# Patient Record
Sex: Male | Born: 1970 | Race: White | Hispanic: No | Marital: Single | Smoking: Never smoker
Health system: Southern US, Community
[De-identification: ages and names within clinical notes are randomized; demographics above are authoritative.]

## PROBLEM LIST (undated history)

## (undated) DIAGNOSIS — E785 Hyperlipidemia, unspecified: Secondary | ICD-10-CM

## (undated) DIAGNOSIS — F79 Unspecified intellectual disabilities: Secondary | ICD-10-CM

## (undated) DIAGNOSIS — E039 Hypothyroidism, unspecified: Secondary | ICD-10-CM

## (undated) DIAGNOSIS — F250 Schizoaffective disorder, bipolar type: Secondary | ICD-10-CM

## (undated) DIAGNOSIS — F3010 Manic episode without psychotic symptoms, unspecified: Secondary | ICD-10-CM

## (undated) DIAGNOSIS — F0390 Unspecified dementia without behavioral disturbance: Secondary | ICD-10-CM

---

## 2004-03-02 ENCOUNTER — Emergency Department: Payer: Self-pay | Admitting: Emergency Medicine

## 2005-11-23 ENCOUNTER — Emergency Department: Payer: Self-pay | Admitting: Unknown Physician Specialty

## 2017-04-22 ENCOUNTER — Other Ambulatory Visit: Payer: Self-pay | Admitting: Student

## 2017-04-22 DIAGNOSIS — R109 Unspecified abdominal pain: Secondary | ICD-10-CM

## 2017-04-22 DIAGNOSIS — K30 Functional dyspepsia: Secondary | ICD-10-CM

## 2017-04-29 ENCOUNTER — Ambulatory Visit
Admission: RE | Admit: 2017-04-29 | Discharge: 2017-04-29 | Disposition: A | Discharge status: Home / Self Care | Payer: Medicaid Other | Source: Ambulatory Visit | Attending: Student | Admitting: Student

## 2017-04-29 DIAGNOSIS — K30 Functional dyspepsia: Secondary | ICD-10-CM | POA: Diagnosis present

## 2017-04-29 DIAGNOSIS — R109 Unspecified abdominal pain: Secondary | ICD-10-CM | POA: Insufficient documentation

## 2018-03-29 ENCOUNTER — Inpatient Hospital Stay
Admission: EM | Admit: 2018-03-29 | Discharge: 2018-04-01 | DRG: 871 | Disposition: A | Discharge status: Home Health | Payer: Medicare Other | Attending: Internal Medicine | Admitting: Internal Medicine

## 2018-03-29 ENCOUNTER — Other Ambulatory Visit: Payer: Self-pay

## 2018-03-29 ENCOUNTER — Encounter: Payer: Self-pay | Admitting: Emergency Medicine

## 2018-03-29 ENCOUNTER — Emergency Department: Payer: Medicare Other

## 2018-03-29 DIAGNOSIS — E039 Hypothyroidism, unspecified: Secondary | ICD-10-CM | POA: Diagnosis present

## 2018-03-29 DIAGNOSIS — E785 Hyperlipidemia, unspecified: Secondary | ICD-10-CM | POA: Diagnosis present

## 2018-03-29 DIAGNOSIS — Z79899 Other long term (current) drug therapy: Secondary | ICD-10-CM | POA: Diagnosis not present

## 2018-03-29 DIAGNOSIS — E876 Hypokalemia: Secondary | ICD-10-CM | POA: Diagnosis present

## 2018-03-29 DIAGNOSIS — Z79890 Hormone replacement therapy (postmenopausal): Secondary | ICD-10-CM | POA: Diagnosis not present

## 2018-03-29 DIAGNOSIS — G9341 Metabolic encephalopathy: Secondary | ICD-10-CM | POA: Diagnosis present

## 2018-03-29 DIAGNOSIS — A419 Sepsis, unspecified organism: Secondary | ICD-10-CM | POA: Diagnosis present

## 2018-03-29 DIAGNOSIS — R509 Fever, unspecified: Secondary | ICD-10-CM | POA: Diagnosis present

## 2018-03-29 DIAGNOSIS — N3090 Cystitis, unspecified without hematuria: Secondary | ICD-10-CM | POA: Diagnosis present

## 2018-03-29 DIAGNOSIS — F250 Schizoaffective disorder, bipolar type: Secondary | ICD-10-CM | POA: Diagnosis present

## 2018-03-29 DIAGNOSIS — N390 Urinary tract infection, site not specified: Secondary | ICD-10-CM | POA: Diagnosis present

## 2018-03-29 DIAGNOSIS — J189 Pneumonia, unspecified organism: Secondary | ICD-10-CM | POA: Diagnosis present

## 2018-03-29 HISTORY — DX: Manic episode without psychotic symptoms, unspecified: F30.10

## 2018-03-29 HISTORY — DX: Hypothyroidism, unspecified: E03.9

## 2018-03-29 HISTORY — DX: Schizoaffective disorder, bipolar type: F25.0

## 2018-03-29 HISTORY — DX: Unspecified intellectual disabilities: F79

## 2018-03-29 HISTORY — DX: Hyperlipidemia, unspecified: E78.5

## 2018-03-29 LAB — CBC WITH DIFFERENTIAL/PLATELET
Abs Immature Granulocytes: 0.17 10*3/uL — ABNORMAL HIGH (ref 0.00–0.07)
BASOS ABS: 0 10*3/uL (ref 0.0–0.1)
Basophils Relative: 0 %
EOS PCT: 0 %
Eosinophils Absolute: 0 10*3/uL (ref 0.0–0.5)
HCT: 43.4 % (ref 39.0–52.0)
Hemoglobin: 15.1 g/dL (ref 13.0–17.0)
Immature Granulocytes: 1 %
Lymphocytes Relative: 4 %
Lymphs Abs: 0.7 10*3/uL (ref 0.7–4.0)
MCH: 32.9 pg (ref 26.0–34.0)
MCHC: 34.8 g/dL (ref 30.0–36.0)
MCV: 94.6 fL (ref 80.0–100.0)
Monocytes Absolute: 2.3 10*3/uL — ABNORMAL HIGH (ref 0.1–1.0)
Monocytes Relative: 13 %
NRBC: 0 % (ref 0.0–0.2)
Neutro Abs: 14.2 10*3/uL — ABNORMAL HIGH (ref 1.7–7.7)
Neutrophils Relative %: 82 %
Platelets: 182 10*3/uL (ref 150–400)
RBC: 4.59 MIL/uL (ref 4.22–5.81)
RDW: 13.2 % (ref 11.5–15.5)
WBC: 17.4 10*3/uL — AB (ref 4.0–10.5)

## 2018-03-29 LAB — URINALYSIS, COMPLETE (UACMP) WITH MICROSCOPIC
Bilirubin Urine: NEGATIVE
Glucose, UA: NEGATIVE mg/dL
KETONES UR: 5 mg/dL — AB
Nitrite: NEGATIVE
PH: 5 (ref 5.0–8.0)
PROTEIN: 100 mg/dL — AB
Specific Gravity, Urine: 1.026 (ref 1.005–1.030)

## 2018-03-29 LAB — COMPREHENSIVE METABOLIC PANEL
ALK PHOS: 52 U/L (ref 38–126)
ALT: 21 U/L (ref 0–44)
AST: 33 U/L (ref 15–41)
Albumin: 3.4 g/dL — ABNORMAL LOW (ref 3.5–5.0)
Anion gap: 11 (ref 5–15)
BUN: 19 mg/dL (ref 6–20)
CO2: 18 mmol/L — ABNORMAL LOW (ref 22–32)
CREATININE: 1.29 mg/dL — AB (ref 0.61–1.24)
Calcium: 8.7 mg/dL — ABNORMAL LOW (ref 8.9–10.3)
Chloride: 106 mmol/L (ref 98–111)
GFR calc non Af Amer: >60 mL/min (ref >60)
Glucose, Bld: 127 mg/dL — ABNORMAL HIGH (ref 70–99)
Potassium: 3.3 mmol/L — ABNORMAL LOW (ref 3.5–5.1)
Sodium: 135 mmol/L (ref 135–145)
Total Bilirubin: 0.9 mg/dL (ref 0.3–1.2)
Total Protein: 6.6 g/dL (ref 6.5–8.1)

## 2018-03-29 LAB — INFLUENZA PANEL BY PCR (TYPE A & B)
INFLAPCR: NEGATIVE
Influenza B By PCR: NEGATIVE

## 2018-03-29 LAB — LACTIC ACID, PLASMA
LACTIC ACID, VENOUS: 2.4 mmol/L — AB (ref 0.5–1.9)
Lactic Acid, Venous: 2.5 mmol/L (ref 0.5–1.9)

## 2018-03-29 LAB — PROTIME-INR
INR: 1.07
Prothrombin Time: 13.8 seconds (ref 11.4–15.2)

## 2018-03-29 LAB — VALPROIC ACID LEVEL: Valproic Acid Lvl: 77 ug/mL (ref 50.0–100.0)

## 2018-03-29 MED ORDER — SODIUM CHLORIDE 0.9 % IV BOLUS (SEPSIS)
1000.0000 mL | Freq: Once | INTRAVENOUS | Status: AC
  Administered 2018-03-29: 1000 mL via INTRAVENOUS

## 2018-03-29 MED ORDER — POTASSIUM CHLORIDE CRYS ER 20 MEQ PO TBCR
40.0000 meq | EXTENDED_RELEASE_TABLET | Freq: Once | ORAL | Status: AC
  Administered 2018-03-29: 40 meq via ORAL
  Filled 2018-03-29: qty 2

## 2018-03-29 MED ORDER — TOPIRAMATE 25 MG PO TABS
75.0000 mg | ORAL_TABLET | Freq: Two times a day (BID) | ORAL | Status: DC
  Administered 2018-03-29 – 2018-04-01 (×6): 75 mg via ORAL
  Filled 2018-03-29 (×8): qty 3

## 2018-03-29 MED ORDER — DIVALPROEX SODIUM ER 500 MG PO TB24
500.0000 mg | ORAL_TABLET | Freq: Every morning | ORAL | Status: DC
  Administered 2018-03-30 – 2018-04-01 (×3): 500 mg via ORAL
  Filled 2018-03-29 (×4): qty 1

## 2018-03-29 MED ORDER — OLANZAPINE 10 MG PO TABS
30.0000 mg | ORAL_TABLET | Freq: Every day | ORAL | Status: DC
  Administered 2018-03-29 – 2018-03-31 (×3): 30 mg via ORAL
  Filled 2018-03-29 (×4): qty 3

## 2018-03-29 MED ORDER — FLUTICASONE PROPIONATE 50 MCG/ACT NA SUSP
2.0000 | Freq: Every day | NASAL | Status: DC | PRN
  Filled 2018-03-29: qty 16

## 2018-03-29 MED ORDER — ACETAMINOPHEN 325 MG PO TABS
650.0000 mg | ORAL_TABLET | Freq: Four times a day (QID) | ORAL | Status: DC | PRN
  Administered 2018-03-30: 19:00:00 650 mg via ORAL
  Filled 2018-03-29: qty 2

## 2018-03-29 MED ORDER — TRIAMCINOLONE ACETONIDE 0.1 % EX CREA
1.0000 "application " | TOPICAL_CREAM | Freq: Two times a day (BID) | CUTANEOUS | Status: DC | PRN
  Filled 2018-03-29: qty 15

## 2018-03-29 MED ORDER — PANTOPRAZOLE SODIUM 40 MG PO TBEC
40.0000 mg | DELAYED_RELEASE_TABLET | Freq: Every day | ORAL | Status: DC
  Administered 2018-03-30 – 2018-04-01 (×3): 40 mg via ORAL
  Filled 2018-03-29 (×3): qty 1

## 2018-03-29 MED ORDER — ENOXAPARIN SODIUM 40 MG/0.4ML ~~LOC~~ SOLN
40.0000 mg | SUBCUTANEOUS | Status: DC
  Administered 2018-03-30 – 2018-04-01 (×3): 40 mg via SUBCUTANEOUS
  Filled 2018-03-29 (×3): qty 0.4

## 2018-03-29 MED ORDER — SODIUM CHLORIDE 0.9 % IV SOLN
1.0000 g | INTRAVENOUS | Status: DC
  Administered 2018-03-30 – 2018-03-31 (×2): 1 g via INTRAVENOUS
  Filled 2018-03-29 (×2): qty 10
  Filled 2018-03-29: qty 1

## 2018-03-29 MED ORDER — BUPROPION HCL ER (XL) 300 MG PO TB24
300.0000 mg | ORAL_TABLET | Freq: Every day | ORAL | Status: DC
  Administered 2018-03-30 – 2018-04-01 (×3): 300 mg via ORAL
  Filled 2018-03-29 (×4): qty 1

## 2018-03-29 MED ORDER — LAMOTRIGINE 100 MG PO TABS
100.0000 mg | ORAL_TABLET | Freq: Every day | ORAL | Status: DC
  Administered 2018-03-30 – 2018-04-01 (×3): 100 mg via ORAL
  Filled 2018-03-29 (×3): qty 1

## 2018-03-29 MED ORDER — LINACLOTIDE 145 MCG PO CAPS
145.0000 ug | ORAL_CAPSULE | Freq: Every day | ORAL | Status: DC
  Administered 2018-03-30 – 2018-04-01 (×3): 145 ug via ORAL
  Filled 2018-03-29 (×4): qty 1

## 2018-03-29 MED ORDER — OLANZAPINE 10 MG PO TABS
30.0000 mg | ORAL_TABLET | ORAL | Status: AC
  Administered 2018-03-29: 30 mg via ORAL
  Filled 2018-03-29: qty 3

## 2018-03-29 MED ORDER — DOCUSATE SODIUM 100 MG PO CAPS
100.0000 mg | ORAL_CAPSULE | Freq: Two times a day (BID) | ORAL | Status: DC
  Administered 2018-03-29 – 2018-03-30 (×3): 100 mg via ORAL
  Filled 2018-03-29 (×3): qty 1

## 2018-03-29 MED ORDER — LEVOTHYROXINE SODIUM 88 MCG PO TABS
88.0000 ug | ORAL_TABLET | Freq: Every day | ORAL | Status: DC
  Administered 2018-03-30 – 2018-04-01 (×3): 88 ug via ORAL
  Filled 2018-03-29 (×3): qty 1

## 2018-03-29 MED ORDER — SUCRALFATE 1 G PO TABS
1.0000 g | ORAL_TABLET | Freq: Two times a day (BID) | ORAL | Status: DC
  Administered 2018-03-29 – 2018-04-01 (×6): 1 g via ORAL
  Filled 2018-03-29 (×5): qty 1

## 2018-03-29 MED ORDER — ONDANSETRON HCL 4 MG PO TABS: 4.0000 mg | ORAL_TABLET | Freq: Four times a day (QID) | ORAL | Status: DC | PRN

## 2018-03-29 MED ORDER — DIVALPROEX SODIUM ER 500 MG PO TB24
2000.0000 mg | ORAL_TABLET | Freq: Every day | ORAL | Status: DC
  Filled 2018-03-29: qty 4

## 2018-03-29 MED ORDER — LORATADINE 10 MG PO TABS: 10.0000 mg | ORAL_TABLET | Freq: Every day | ORAL | Status: DC | PRN

## 2018-03-29 MED ORDER — ATORVASTATIN CALCIUM 20 MG PO TABS
40.0000 mg | ORAL_TABLET | Freq: Every day | ORAL | Status: DC
  Administered 2018-03-30 – 2018-04-01 (×3): 40 mg via ORAL
  Filled 2018-03-29 (×3): qty 2

## 2018-03-29 MED ORDER — VITAMIN D 25 MCG (1000 UNIT) PO TABS
1000.0000 [IU] | ORAL_TABLET | Freq: Every day | ORAL | Status: DC
  Administered 2018-03-30 – 2018-04-01 (×3): 1000 [IU] via ORAL
  Filled 2018-03-29 (×3): qty 1

## 2018-03-29 MED ORDER — SODIUM CHLORIDE 0.9 % IV SOLN
500.0000 mg | Freq: Once | INTRAVENOUS | Status: AC
  Administered 2018-03-29: 500 mg via INTRAVENOUS
  Filled 2018-03-29: qty 500

## 2018-03-29 MED ORDER — SODIUM CHLORIDE 0.9 % IV SOLN
1000.0000 mL | INTRAVENOUS | Status: DC
  Administered 2018-03-29 – 2018-04-01 (×6): 1000 mL via INTRAVENOUS

## 2018-03-29 MED ORDER — TOPIRAMATE 25 MG PO TABS
75.0000 mg | ORAL_TABLET | ORAL | Status: AC
  Administered 2018-03-29: 75 mg via ORAL
  Filled 2018-03-29: qty 3

## 2018-03-29 MED ORDER — ACETAMINOPHEN 650 MG RE SUPP: 650.0000 mg | Freq: Four times a day (QID) | RECTAL | Status: DC | PRN

## 2018-03-29 MED ORDER — DIVALPROEX SODIUM ER 500 MG PO TB24
2000.0000 mg | ORAL_TABLET | Freq: Every day | ORAL | Status: DC
  Administered 2018-03-30 – 2018-03-31 (×2): 2000 mg via ORAL
  Filled 2018-03-29 (×3): qty 4

## 2018-03-29 MED ORDER — SODIUM FLUORIDE 1.1 % DT CREA: 1.0000 "application " | TOPICAL_CREAM | Freq: Two times a day (BID) | DENTAL | Status: DC

## 2018-03-29 MED ORDER — DIVALPROEX SODIUM ER 500 MG PO TB24
2000.0000 mg | ORAL_TABLET | ORAL | Status: AC
  Administered 2018-03-29: 2000 mg via ORAL
  Filled 2018-03-29: qty 4
  Filled 2018-03-29: qty 8

## 2018-03-29 MED ORDER — SODIUM CHLORIDE 0.9 % IV BOLUS (SEPSIS)
500.0000 mL | Freq: Once | INTRAVENOUS | Status: AC
  Administered 2018-03-29: 500 mL via INTRAVENOUS

## 2018-03-29 MED ORDER — OLANZAPINE 5 MG PO TABS: 15.0000 mg | ORAL_TABLET | ORAL | Status: DC

## 2018-03-29 MED ORDER — ONDANSETRON HCL 4 MG/2ML IJ SOLN: 4.0000 mg | Freq: Four times a day (QID) | INTRAMUSCULAR | Status: DC | PRN

## 2018-03-29 MED ORDER — SODIUM CHLORIDE 0.9 % IV SOLN
1.0000 g | Freq: Once | INTRAVENOUS | Status: AC
  Administered 2018-03-29: 1 g via INTRAVENOUS
  Filled 2018-03-29: qty 10

## 2018-03-29 NOTE — Progress Notes (Signed)
CODE SEPSIS - PHARMACY COMMUNICATION  **Broad Spectrum Antibiotics should be administered within 1 hour of Sepsis diagnosis**  Time Code Sepsis Called/Page Received: 1843  Antibiotics Ordered: Ceftriaxone  Time of 1st antibiotic administration: 1940  Additional action taken by pharmacy: 641931 - Spoke with RN who was in the process of obtaining the medication for administration  If necessary, Name of Provider/Nurse Contacted: Sarah, RN    Jared Mayer ,PharmD Clinical Pharmacist  03/29/2018  6:58 PM

## 2018-03-29 NOTE — ED Notes (Signed)
Pt much more talkative and alert in room with parents.

## 2018-03-29 NOTE — ED Provider Notes (Signed)
[facility] Emergency Department Provider Note  ____________________________________________   I have reviewed the triage vital signs and the nursing notes. Where available I have reviewed prior notes and, if possible and indicated, outside hospital notes.    HISTORY  Chief Complaint Altered Mental Status    HPI Jared Mayer is a 47 y.o. male who has a history of some degree of mental disability, as well as bipolar disorder, he has had urinary tract infections in the past, he has somewhat diminished from his baseline mentally, in terms of his level of interaction but he is alert.  Family states that he has been having decreased p.o. and having some fevers.  Otherwise, no focality.  No cough.  Positive nausea.  Level 5 chart caveat; no further history available due to patient status.  Mother believes that she has a urinary tract infection as this is usually why he gets like this.     Past Medical History:  Diagnosis Date  . Dyslipidemia   . Hypothyroid   . Manic behavior (HCC)   . Mentally disabled   . Schizoaffective disorder, bipolar type (HCC)     There are no active problems to display for this patient.   History reviewed. No pertinent surgical history.  Prior to Admission medications   Medication Sig Start Date End Date Taking? Authorizing Provider  atorvastatin (LIPITOR) 40 MG tablet Take 40 mg by mouth daily.   Yes [provider]  buPROPion (WELLBUTRIN XL) 150 MG 24 hr tablet Take 300 mg by mouth daily.   Yes [provider]  cholecalciferol (VITAMIN D3) 25 MCG (1000 UT) tablet Take 1,000 Units by mouth daily.   Yes [provider]  divalproex (DEPAKOTE ER) 500 MG 24 hr tablet Take 500-2,000 mg by mouth See admin instructions. 500 mg every morning and 2000 mg at bedtime   Yes [provider]  docusate sodium (COLACE) 100 MG capsule Take 100 mg by mouth 2 (two) times daily.   Yes [provider]   fexofenadine (ALLEGRA) 180 MG tablet Take 180 mg by mouth daily as needed for allergies.    Yes [provider]  fluticasone (FLONASE) 50 MCG/ACT nasal spray Place 2 sprays into the nose daily as needed for allergies or rhinitis.    Yes [provider]  lamoTRIgine (LAMICTAL) 100 MG tablet Take 100 mg by mouth daily.   Yes [provider]  levothyroxine (SYNTHROID, LEVOTHROID) 88 MCG tablet Take 88 mcg by mouth daily.   Yes [provider]  linaclotide (LINZESS) 145 MCG CAPS capsule Take 145 mcg by mouth daily.   Yes [provider]  OLANZapine (ZYPREXA) 15 MG tablet Take 30 mg by mouth at bedtime.   Yes [provider]  Omega-3 Fatty Acids (FISH OIL) 1000 MG CAPS Take 1,000 mg by mouth 2 (two) times daily.   Yes [provider]  omeprazole (PRILOSEC) 20 MG capsule Take 40 mg by mouth daily.   Yes [provider]  sodium fluoride (PREVIDENT 5000 PLUS) 1.1 % CREA dental cream Place 1 application onto teeth 2 (two) times daily.   Yes [provider]  sucralfate (CARAFATE) 1 g tablet Take 1 g by mouth 2 (two) times daily.   Yes [provider]  topiramate (TOPAMAX) 25 MG tablet Take 75 mg by mouth 2 (two) times daily.   Yes [provider]  triamcinolone cream (KENALOG) 0.1 % Apply 1 application topically 2 (two) times daily as needed (for inflammation/irritation).  Yes [provider]  triamcinolone ointment (KENALOG) 0.1 % Apply 1 application topically 2 (two) times daily as needed (for inflammation/irritation).   Yes [provider]    Allergies Patient has no known allergies.  Family History  Problem Relation Age of Onset  . Hypothyroidism Mother   . Sarcoidosis Mother     Social History Social History   Tobacco Use  . Smoking status: Never Smoker  . Smokeless tobacco: Never Used  Substance Use Topics  . Alcohol use: Never    Frequency: Never  . Drug use: Never     Review of Systems Constitutional: + fever/chills Eyes: No visual changes. ENT: No sore throat. No stiff neck no neck pain Cardiovascular: Denies chest pain. Respiratory: Denies shortness of breath. Gastrointestinal:   no vomiting.  No diarrhea.  No constipation. Genitourinary: Negative for dysuria. Musculoskeletal: Negative lower extremity swelling Skin: Negative for rash. Neurological: Negative for severe headaches, focal weakness or numbness.   ____________________________________________   PHYSICAL EXAM:  VITAL SIGNS: ED Triage Vitals  Enc Vitals Group     BP 03/29/18 1824 99/61     Pulse Rate 03/29/18 1824 (!) 137     Resp 03/29/18 1824 20     Temp 03/29/18 1824 (!) 100.6 F (38.1 C)     Temp Source 03/29/18 1824 Oral     SpO2 03/29/18 1824 95 %     Weight 03/29/18 1820 180 lb (81.6 kg)     Height --      Head Circumference --      Peak Flow --      Pain Score --      Pain Loc --      Pain Edu? --      Excl. in GC? --     Constitutional: Alert and very slightly somnolent but not in acute distress Eyes: Conjunctivae are normal Head: Atraumatic HEENT: No congestion/rhinnorhea. Mucous membranes are moist.  Oropharynx non-erythematous Neck:   Nontender with no meningismus, no masses, no stridor Cardiovascular: Tachycardia noted, regular rhythm. Grossly normal heart sounds.  Good peripheral circulation. Respiratory: Normal respiratory effort.  No retractions. Lungs CTAB. Abdominal: Soft and nontender. No distention. No guarding no rebound Back:  There is no focal tenderness or step off.  there is no midline tenderness there are no lesions noted. there is no CVA tenderness Normal external genitalia, no lesions noted Musculoskeletal: No lower extremity tenderness, no upper extremity tenderness. No joint effusions, no DVT signs strong distal pulses no edema Neurologic:  Normal speech and language. No gross focal neurologic deficits are appreciated.  Skin:  Skin is  warm, dry and intact. No rash noted. Psychiatric: Mood and affect are normal. Speech and behavior are normal.  ____________________________________________   LABS (all labs ordered are listed, but only abnormal results are displayed)  Labs Reviewed  COMPREHENSIVE METABOLIC PANEL - Abnormal; Notable for the following components:      Result Value   Potassium 3.3 (*)    CO2 18 (*)    Glucose, Bld 127 (*)    Creatinine, Ser 1.29 (*)    Calcium 8.7 (*)    Albumin 3.4 (*)    All other components within normal limits  CBC WITH DIFFERENTIAL/PLATELET - Abnormal; Notable for the following components:   WBC 17.4 (*)    Neutro Abs 14.2 (*)    Monocytes Absolute 2.3 (*)    Abs Immature Granulocytes 0.17 (*)    All other components within normal limits  URINALYSIS, COMPLETE (UACMP)  WITH MICROSCOPIC - Abnormal; Notable for the following components:   Color, Urine AMBER (*)    APPearance CLOUDY (*)    Hgb urine dipstick LARGE (*)    Ketones, ur 5 (*)    Protein, ur 100 (*)    Leukocytes, UA SMALL (*)    RBC / HPF >50 (*)    Bacteria, UA RARE (*)    All other components within normal limits  LACTIC ACID, PLASMA - Abnormal; Notable for the following components:   Lactic Acid, Venous 2.5 (*)    All other components within normal limits  CULTURE, BLOOD (ROUTINE X 2)  CULTURE, BLOOD (ROUTINE X 2)  URINE CULTURE  PROTIME-INR  VALPROIC ACID LEVEL  INFLUENZA PANEL BY PCR (TYPE A & B)  URINALYSIS, ROUTINE W REFLEX MICROSCOPIC  LACTIC ACID, PLASMA    Pertinent labs  results that were available during my care of the patient were reviewed by me and considered in my medical decision making (see chart for details). ____________________________________________  EKG  I personally interpreted any EKGs ordered by me or triage  ____________________________________________  RADIOLOGY  Pertinent labs & imaging results that were available during my care of the patient were reviewed by me and  considered in my medical decision making (see chart for details). If possible, patient and/or family made aware of any abnormal findings.  Dg Chest 2 View  Result Date: 03/29/2018 CLINICAL DATA:  Confusion EXAM: CHEST - 2 VIEW COMPARISON:  None. FINDINGS: Mild opacity in left base with obscuration left hemidiaphragm. The heart, hila, mediastinum, lungs, and pleura are otherwise unremarkable. IMPRESSION: Mild opacity in left base obscuring left hemidiaphragm may represent atelectasis. Early infiltrate not excluded. Recommend follow-up to resolution. Electronically Signed   By: Gerome Sam III M.D   On: 03/29/2018 19:27   ____________________________________________    PROCEDURES  Procedure(s) performed: None  Procedures  Critical Care performed: CRITICAL CARE Performed by: Jeanmarie Plant   Total critical care time: 39 minutes  Critical care time was exclusive of separately billable procedures and treating other patients.  Critical care was necessary to treat or prevent imminent or life-threatening deterioration.  Critical care was time spent personally by me on the following activities: development of treatment plan with patient and/or surrogate as well as nursing, discussions with consultants, evaluation of patient's response to treatment, examination of patient, obtaining history from patient or surrogate, ordering and performing treatments and interventions, ordering and review of laboratory studies, ordering and review of radiographic studies, pulse oximetry and re-evaluation of patient's condition.   ____________________________________________   INITIAL IMPRESSION / ASSESSMENT AND PLAN / ED COURSE  Pertinent labs & imaging results that were available during my care of the patient were reviewed by me and considered in my medical decision making (see chart for details).  Patient in no acute distress, but tachycardic febrile and concern exists for possible sepsis.  Urinary  tract infection identified possible pneumonia as well treating with Rocephin Zithromax and sepsis level fluids, patient awake and alert blood pressure and vital signs are responding well to intervention he has been admitted to the hospitalist service.  Abdomen is benign.    ____________________________________________   FINAL CLINICAL IMPRESSION(S) / ED DIAGNOSES  Final diagnoses:  Fever, unspecified fever cause      This chart was dictated using voice recognition software.  Despite best efforts to proofread,  errors can occur which can change meaning.      Jeanmarie Plant, MD 03/29/18 2042

## 2018-03-29 NOTE — ED Triage Notes (Signed)
First Nurse Note:  Arrives from group home with c/o confusion today.  Possible patient has UTI.

## 2018-03-29 NOTE — ED Triage Notes (Signed)
Been lethargic and having abdominal pain. From [facility].  Marliss CzarLinda mclamb is legal guardian and aware pt is here; she is with patient.  Febrile, tachy, and low BP in triage.

## 2018-03-29 NOTE — H&P (Signed)
[employer] - [place] [facility]    PATIENT NAME: Jared Mayer    MR#:  [id number]  DATE OF BIRTH:  [birth year]  DATE OF ADMISSION:  03/29/2018  PRIMARY CARE PHYSICIAN: [name], MD   REQUESTING/REFERRING PHYSICIAN: Dr. Ileana Roup  CHIEF COMPLAINT:   Chief Complaint  Patient presents with  . Altered Mental Status    HISTORY OF PRESENT ILLNESS:  Jared Mayer  is a 47 y.o. male with a known history of schizophrenia, MR, hypothyroidism, hyperlipidemia, previous history of UTI who presents to the hospital due to altered mental status and weakness.  Most of the history obtained from the mother and also the group home caretaker.  As per the mom and also the group home caretaker patient has not been acting like himself over the past few days.  He complained of some vague abdominal pain this past weekend and he thought he was constipated and received some laxatives, he had a bowel movement and then had some intermittent nausea vomiting which has not resolved.  Today he was feeling increasingly weak and had increasing urinary incontinence which is not normal for him.  He also had a fever of 101 at the group home and therefore was sent to the ER for further evaluation.  In the emergency room patient's had routine blood work done which showed evidence of a UTI with suspected sepsis.  Hospitalist services were contacted for admission.  PAST MEDICAL HISTORY:   Past Medical History:  Diagnosis Date  . Dyslipidemia   . Hypothyroid   . Manic behavior (HCC)   . Mentally disabled   . Schizoaffective disorder, bipolar type (HCC)     PAST SURGICAL HISTORY:  History reviewed. No pertinent surgical history.  SOCIAL HISTORY:   Social History   Tobacco Use  . Smoking status: Never Smoker  . Smokeless tobacco: Never Used  Substance Use Topics  . Alcohol use: Never    Frequency: Never    FAMILY HISTORY:   Family History  Problem Relation Age of Onset  .  Hypothyroidism Mother   . Sarcoidosis Mother     DRUG ALLERGIES:  No Known Allergies  REVIEW OF SYSTEMS:   Review of Systems  Unable to perform ROS: Mental acuity    MEDICATIONS AT HOME:   Prior to Admission medications   Medication Sig Start Date End Date Taking? Authorizing Provider  atorvastatin (LIPITOR) 40 MG tablet Take 40 mg by mouth daily.   Yes [provider]  buPROPion (WELLBUTRIN XL) 150 MG 24 hr tablet Take 300 mg by mouth daily.   Yes [provider]  cholecalciferol (VITAMIN D3) 25 MCG (1000 UT) tablet Take 1,000 Units by mouth daily.   Yes [provider]  divalproex (DEPAKOTE ER) 500 MG 24 hr tablet Take 500-2,000 mg by mouth See admin instructions. 500 mg every morning and 2000 mg at bedtime   Yes [provider]  docusate sodium (COLACE) 100 MG capsule Take 100 mg by mouth 2 (two) times daily.   Yes [provider]  fexofenadine (ALLEGRA) 180 MG tablet Take 180 mg by mouth daily as needed for allergies.    Yes [provider]  fluticasone (FLONASE) 50 MCG/ACT nasal spray Place 2 sprays into the nose daily as needed for allergies or rhinitis.    Yes [provider]  lamoTRIgine (LAMICTAL) 100 MG tablet Take 100 mg by mouth daily.   Yes [provider]  levothyroxine (SYNTHROID, LEVOTHROID) 88 MCG tablet Take 88  mcg by mouth daily.   Yes [provider]  linaclotide (LINZESS) 145 MCG CAPS capsule Take 145 mcg by mouth daily.   Yes [provider]  OLANZapine (ZYPREXA) 15 MG tablet Take 30 mg by mouth at bedtime.   Yes [provider]  Omega-3 Fatty Acids (FISH OIL) 1000 MG CAPS Take 1,000 mg by mouth 2 (two) times daily.   Yes [provider]  omeprazole (PRILOSEC) 20 MG capsule Take 40 mg by mouth daily.   Yes [provider]  sodium fluoride (PREVIDENT 5000 PLUS) 1.1 % CREA dental cream Place 1 application onto teeth 2 (two) times daily.   Yes [provider]  sucralfate (CARAFATE) 1 g tablet Take 1 g by mouth 2 (two) times daily.   Yes [provider]  topiramate (TOPAMAX) 25 MG tablet Take 75 mg by mouth 2 (two) times daily.   Yes [provider]  triamcinolone cream (KENALOG) 0.1 % Apply 1 application topically 2 (two) times daily as needed (for inflammation/irritation).   Yes [provider]  triamcinolone ointment (KENALOG) 0.1 % Apply 1 application topically 2 (two) times daily as needed (for inflammation/irritation).   Yes [provider]      VITAL SIGNS:  Blood pressure 101/76, pulse (!) 102, temperature 98.6 F (37 C), temperature source Oral, resp. rate (!) 23, weight 81.6 kg, SpO2 96 %.  PHYSICAL EXAMINATION:  Physical Exam  GENERAL:  47 y.o.-year-old patient lying in the bed in no acute distress.  EYES: Pupils equal, round, reactive to light and accommodation. No scleral icterus. Extraocular muscles intact.  HEENT: Head atraumatic, normocephalic. Oropharynx and nasopharynx clear. No oropharyngeal erythema, moist oral mucosa  NECK:  Supple, no jugular venous distention. No thyroid enlargement, no tenderness.  LUNGS: Normal breath sounds bilaterally, no wheezing, rales, rhonchi. No use of accessory muscles of respiration.  CARDIOVASCULAR: S1, S2 RRR. No murmurs, rubs, gallops, clicks.  ABDOMEN: Soft, nontender, nondistended. Bowel sounds present. No organomegaly or mass.  EXTREMITIES: No pedal edema, cyanosis, or clubbing. + 2 pedal & radial pulses b/l.   NEUROLOGIC: Cranial nerves II through XII are intact. No focal Motor or sensory deficits appreciated b/l PSYCHIATRIC: The patient is alert and oriented x 3. SKIN: No obvious rash, lesion, or ulcer.   LABORATORY PANEL:   CBC Recent Labs  Lab 03/29/18 1840  WBC 17.4*  HGB 15.1  HCT 43.4  PLT 182    ------------------------------------------------------------------------------------------------------------------  Chemistries  Recent Labs  Lab 03/29/18 1840  NA 135  K 3.3*  CL 106  CO2 18*  GLUCOSE 127*  BUN 19  CREATININE 1.29*  CALCIUM 8.7*  AST 33  ALT 21  ALKPHOS 52  BILITOT 0.9   ------------------------------------------------------------------------------------------------------------------  Cardiac Enzymes No results for input(s): TROPONINI in the last 168 hours. ------------------------------------------------------------------------------------------------------------------  RADIOLOGY:  Dg Chest 2 View  Result Date: 03/29/2018 CLINICAL DATA:  Confusion EXAM: CHEST - 2 VIEW COMPARISON:  None. FINDINGS: Mild opacity in left base with obscuration left hemidiaphragm. The heart, hila, mediastinum, lungs, and pleura are otherwise unremarkable. IMPRESSION: Mild opacity in left base obscuring left hemidiaphragm may represent atelectasis. Early infiltrate not excluded. Recommend follow-up to resolution. Electronically Signed   By: Gerome Sam III M.D   On: 03/29/2018 19:27     IMPRESSION AND PLAN:   47 year old male with past medical history of MR, schizophrenia, hyperlipidemia, hypothyroidism who presents from the group home due to altered mental status, weakness.  1.  Sepsis-patient meets criteria given his  fever, leukocytosis and abnormal urinalysis consistent with a UTI. -We will treat patient with IV ceftriaxone, follow urine and blood cultures.  2.  Urinary tract infection-source of patient's sepsis. -We will treat with IV ceftriaxone, follow urine cultures.  3.  Altered mental status- metabolic encephalopathy secondary to underlying UTI. - As mentioned we will treat the patient with IV antibiotics with ceftriaxone, follow mental status.  4.  Hypokalemia- will replace potassium orally, repeat level in the morning.  5.  History of  schizophrenia/MR-continue Depakote, Wellbutrin, Zyprexa, Topamax, Linzess.  6.  Hypothyroidism-continue Synthroid.  7.  Hyperlipidemia-continue atorvastatin.    All the records are reviewed and case discussed with ED provider. Management plans discussed with the patient, family and they are in agreement.  CODE STATUS: Full code  TOTAL TIME TAKING CARE OF THIS PATIENT: 40 minutes.    Houston SirenSAINANI,VIVEK J M.D on 03/29/2018 at 8:55 PM  Between 7am to 6pm - Pager - [phone]  After 6pm go to [email or web address] - password [email or web address] [facility]  [facility]  Office  [phone]  CC: Primary care physician; Jerl MinaHedrick, James, MD

## 2018-03-29 NOTE — Plan of Care (Signed)
  Problem: Education: Goal: Knowledge of General Education information will improve Description Including pain rating scale, medication(s)/side effects and non-pharmacologic comfort measures Outcome: Progressing   Problem: Health Behavior/Discharge Planning: Goal: Ability to manage health-related needs will improve Outcome: Progressing   Problem: Clinical Measurements: Goal: Ability to maintain clinical measurements within normal limits will improve Outcome: Progressing Goal: Will remain free from infection Outcome: Progressing Goal: Diagnostic test results will improve Outcome: Progressing Goal: Cardiovascular complication will be avoided Outcome: Progressing   Problem: Activity: Goal: Risk for activity intolerance will decrease Outcome: Progressing   Problem: Activity: Goal: Risk for activity intolerance will decrease Outcome: Progressing   Problem: Activity: Goal: Risk for activity intolerance will decrease Outcome: Progressing   Problem: Activity: Goal: Risk for activity intolerance will decrease Outcome: Progressing   Problem: Nutrition: Goal: Adequate nutrition will be maintained Outcome: Progressing   Problem: Coping: Goal: Level of anxiety will decrease Outcome: Progressing   Problem: Elimination: Goal: Will not experience complications related to bowel motility Outcome: Progressing Goal: Will not experience complications related to urinary retention Outcome: Progressing   Problem: Pain Managment: Goal: General experience of comfort will improve Outcome: Progressing   Problem: Safety: Goal: Ability to remain free from injury will improve Outcome: Progressing   Problem: Urinary Elimination: Goal: Signs and symptoms of infection will decrease Outcome: Progressing

## 2018-03-30 LAB — BASIC METABOLIC PANEL
Anion gap: 5 (ref 5–15)
BUN: 13 mg/dL (ref 6–20)
CHLORIDE: 114 mmol/L — AB (ref 98–111)
CO2: 21 mmol/L — ABNORMAL LOW (ref 22–32)
Calcium: 7.3 mg/dL — ABNORMAL LOW (ref 8.9–10.3)
Creatinine, Ser: 0.98 mg/dL (ref 0.61–1.24)
GFR calc Af Amer: >60 mL/min (ref >60)
GFR calc non Af Amer: >60 mL/min (ref >60)
Glucose, Bld: 82 mg/dL (ref 70–99)
POTASSIUM: 3.7 mmol/L (ref 3.5–5.1)
SODIUM: 140 mmol/L (ref 135–145)

## 2018-03-30 LAB — CBC
HEMATOCRIT: 39.5 % (ref 39.0–52.0)
Hemoglobin: 13 g/dL (ref 13.0–17.0)
MCH: 32.4 pg (ref 26.0–34.0)
MCHC: 32.9 g/dL (ref 30.0–36.0)
MCV: 98.5 fL (ref 80.0–100.0)
Platelets: 142 10*3/uL — ABNORMAL LOW (ref 150–400)
RBC: 4.01 MIL/uL — ABNORMAL LOW (ref 4.22–5.81)
RDW: 13.4 % (ref 11.5–15.5)
WBC: 14.1 10*3/uL — AB (ref 4.0–10.5)
nRBC: 0 % (ref 0.0–0.2)

## 2018-03-30 LAB — MRSA PCR SCREENING: MRSA BY PCR: NEGATIVE

## 2018-03-30 NOTE — Progress Notes (Signed)
[employer] -  at [facility]                                                                                                                                                                                  Patient Demographics   Jared Mayer, is a 47 y.o. male, DOB - 1970/08/05, ZOX:[id number]  Admit date - 03/29/2018   Admitting Physician Houston Siren, MD  Outpatient Primary MD for the patient is Jared Mina, MD   LOS - 1  Subjective: He is still sleepy able to answer questions    Review of Systems:   CONSTITUTIONAL: Unable to provide v Vitals:   Vitals:   03/30/18 0417 03/30/18 0815 03/30/18 0920 03/30/18 1309  BP: (!) 95/51 (!) 96/55  (!) 87/60  Pulse: 96 93  (!) 103  Resp: 20   20  Temp: 98.4 F (36.9 C) 99.5 F (37.5 C) 100.1 F (37.8 C) 98.7 F (37.1 C)  TempSrc: Oral Oral Oral Oral  SpO2: 99% 99%  98%  Weight:      Height:        Wt Readings from Last 3 Encounters:  03/29/18 81.6 kg     Intake/Output Summary (Last 24 hours) at 03/30/2018 1329 Last data filed at 03/30/2018 0257 Gross per 24 hour  Intake 3409.09 ml  Output 175 ml  Net 3234.09 ml    Physical Exam:   GENERAL: Pleasant-appearing in no apparent distress.  HEAD, EYES, EARS, NOSE AND THROAT: Atraumatic, normocephalic. Extraocular muscles are intact. Pupils equal and reactive to light. Sclerae anicteric. No conjunctival injection. No oro-pharyngeal erythema.  NECK: Supple. There is no jugular venous distention. No bruits, no lymphadenopathy, no thyromegaly.  HEART: Regular rate and rhythm,. No murmurs, no rubs, no clicks.  LUNGS: Clear to auscultation bilaterally. No rales or rhonchi. No wheezes.  ABDOMEN: Soft, flat, nontender, nondistended. Has good bowel sounds. No hepatosplenomegaly appreciated.  EXTREMITIES: No evidence of any cyanosis, clubbing, or peripheral edema.  +2 pedal and radial pulses bilaterally.  NEUROLOGIC: Sleepy.  SKIN: Moist and warm with no  rashes appreciated.  Psych: Not anxious, depressed LN: No inguinal LN enlargement    Antibiotics   Anti-infectives (From admission, onward)   Start     Dose/Rate Route Frequency Ordered Stop   03/30/18 1900  cefTRIAXone (ROCEPHIN) 1 g in sodium chloride 0.9 % 100 mL IVPB     1 g 200 mL/hr over 30 Minutes Intravenous Every 24 hours 03/29/18 2155     03/29/18 1945  azithromycin (ZITHROMAX) 500 mg in sodium chloride 0.9 % 250 mL IVPB     500 mg 250 mL/hr over 60 Minutes Intravenous  Once 03/29/18  1931 03/29/18 2114   03/29/18 1930  cefTRIAXone (ROCEPHIN) 1 g in sodium chloride 0.9 % 100 mL IVPB     1 g 200 mL/hr over 30 Minutes Intravenous  Once 03/29/18 1927 03/29/18 2251      Medications   Scheduled Meds: . atorvastatin  40 mg Oral Daily  . buPROPion  300 mg Oral Daily  . cholecalciferol  1,000 Units Oral Daily  . divalproex  2,000 mg Oral QHS  . divalproex  500 mg Oral q morning - 10a  . docusate sodium  100 mg Oral BID  . enoxaparin (LOVENOX) injection  40 mg Subcutaneous Q24H  . lamoTRIgine  100 mg Oral Daily  . levothyroxine  88 mcg Oral Q0600  . linaclotide  145 mcg Oral Daily  . OLANZapine  30 mg Oral QHS  . pantoprazole  40 mg Oral Daily  . sucralfate  1 g Oral BID  . topiramate  75 mg Oral BID   Continuous Infusions: . sodium chloride 1,000 mL (03/30/18 0635)  . cefTRIAXone (ROCEPHIN)  IV     PRN Meds:.acetaminophen **OR** acetaminophen, fluticasone, loratadine, ondansetron **OR** ondansetron (ZOFRAN) IV, triamcinolone cream   Data Review:   Micro Results Recent Results (from the past 240 hour(s))  Culture, blood (Routine x 2)     Status: None (Preliminary result)   Collection Time: 03/29/18  6:40 PM  Result Value Ref Range Status   Specimen Description BLOOD Blood Culture adequate volume  Final   Special Requests   Final    BOTTLES DRAWN AEROBIC AND ANAEROBIC RIGHT ANTECUBITAL   Culture   Final    NO GROWTH < 12 HOURS Performed at [facility], [address], [place], [place] [address]    Report Status PENDING  Incomplete  Culture, blood (Routine x 2)     Status: None (Preliminary result)   Collection Time: 03/29/18  6:40 PM  Result Value Ref Range Status   Specimen Description BLOOD Blood Culture adequate volume  Final   Special Requests   Final    BOTTLES DRAWN AEROBIC AND ANAEROBIC BLOOD LEFT FOREARM   Culture   Final    NO GROWTH < 12 HOURS Performed at [facility], [address], [place], [place] [address]    Report Status PENDING  Incomplete  MRSA PCR Screening     Status: None   Collection Time: 03/29/18 11:03 PM  Result Value Ref Range Status   MRSA by PCR NEGATIVE NEGATIVE Final    Comment:        The GeneXpert MRSA Assay (FDA approved for NASAL specimens only), is one component of a comprehensive MRSA colonization surveillance program. It is not intended to diagnose MRSA infection nor to guide or monitor treatment for MRSA infections. Performed at [facility], [address] [place], [place] [address]     Radiology Reports Dg Chest 2 View  Result Date: 03/29/2018 CLINICAL DATA:  Confusion EXAM: CHEST - 2 VIEW COMPARISON:  None. FINDINGS: Mild opacity in left base with obscuration left hemidiaphragm. The heart, hila, mediastinum, lungs, and pleura are otherwise unremarkable. IMPRESSION: Mild opacity in left base obscuring left hemidiaphragm may represent atelectasis. Early infiltrate not excluded. Recommend follow-up to resolution. Electronically Signed   By: Gerome Sam III M.D   On: 03/29/2018 19:27     CBC Recent Labs  Lab 03/29/18 1840 03/30/18 0434  WBC 17.4* 14.1*  HGB 15.1 13.0  HCT 43.4 39.5  PLT 182 142*  MCV 94.6  98.5  MCH 32.9 32.4  MCHC 34.8 32.9  RDW 13.2 13.4  LYMPHSABS 0.7  --   MONOABS 2.3*  --   EOSABS 0.0  --   BASOSABS 0.0  --     Chemistries  Recent Labs  Lab 03/29/18 1840 03/30/18 0434  NA 135 140  K 3.3* 3.7  CL 106 114*  CO2  18* 21*  GLUCOSE 127* 82  BUN 19 13  CREATININE 1.29* 0.98  CALCIUM 8.7* 7.3*  AST 33  --   ALT 21  --   ALKPHOS 52  --   BILITOT 0.9  --    ------------------------------------------------------------------------------------------------------------------ estimated creatinine clearance is 95.3 mL/min (by C-G formula based on SCr of 0.98 mg/dL). ------------------------------------------------------------------------------------------------------------------ No results for input(s): HGBA1C in the last 72 hours. ------------------------------------------------------------------------------------------------------------------ No results for input(s): CHOL, HDL, LDLCALC, TRIG, CHOLHDL, LDLDIRECT in the last 72 hours. ------------------------------------------------------------------------------------------------------------------ No results for input(s): TSH, T4TOTAL, T3FREE, THYROIDAB in the last 72 hours.  Invalid input(s): FREET3 ------------------------------------------------------------------------------------------------------------------ No results for input(s): VITAMINB12, FOLATE, FERRITIN, TIBC, IRON, RETICCTPCT in the last 72 hours.  Coagulation profile Recent Labs  Lab 03/29/18 1840  INR 1.07    No results for input(s): DDIMER in the last 72 hours.  Cardiac Enzymes No results for input(s): CKMB, TROPONINI, MYOGLOBIN in the last 168 hours.  Invalid input(s): CK ------------------------------------------------------------------------------------------------------------------ Invalid input(s): POCBNP    Assessment & Plan   47 year old male with past medical history of MR, schizophrenia, hyperlipidemia, hypothyroidism who presents from the group home due to altered mental status, weakness.  1.  Sepsis- Continue IV ceftriaxone urine cultures pending  2.  Urinary tract infection-source of patient's sepsis. -We will treat with IV ceftriaxone, follow urine  cultures.  3.  Altered mental status- metabolic encephalopathy secondary to underlying UTI. - As mentioned we will treat the patient with IV antibiotics with ceftriaxone, follow mental status.  4.  Hypokalemia- will replace potassium orally, repeat level in the morning.  5.  History of schizophrenia/MR-continue Depakote, Wellbutrin, Zyprexa, Topamax, Linzess.  6.  Hypothyroidism-continue Synthroid.  7.  Hyperlipidemia-continue atorvastatin.      Code Status Orders  (From admission, onward)         Start     Ordered   03/29/18 2156  Full code  Continuous     03/29/18 2155        Code Status History    This patient has a current code status but no historical code status.           Consults  none  DVT Prophylaxis  Lovenox    Lab Results  Component Value Date   PLT 142 (L) 03/30/2018     Time Spent in minutes 35min  Greater than 50% of time spent in care coordination and counseling patient regarding the condition and plan of care.   Auburn BilberryShreyang Cloma Rahrig M.D on 03/30/2018 at 1:29 PM  Between 7am to 6pm - Pager - [phone]  After 6pm go to [email or web address] - Social research officer, governmentpassword [email or web address]  [employer]   Office  [phone]

## 2018-03-30 NOTE — Clinical Social Work Note (Deleted)
Clinical Social Work Assessment  Patient Details  Name: Jared Mayer MRN: [id number] Date of Birth: Jun 12, 1970  Date of referral:  03/30/18               Reason for consult:  Facility Placement                Permission sought to share information with:  Facility Medical sales representativeContact Representative, Family Supports Permission granted to share information::  Yes, Verbal Permission Granted  Name::     [facility]Jared [phone]  Agency::  [facility]  Relationship::     Contact Information:     Housing/Transportation Living arrangements for the past 2 months:  Group Home Source of Information:  Patient, Medical Team Patient Interpreter Needed:  None Criminal Activity/Legal Involvement Pertinent to Current Situation/Hospitalization:  No - Comment as needed Significant Relationships:  Other Family Members, Community Support Lives with:  Facility Resident Do you feel safe going back to the place where you live?  Yes Need for family participation in patient care:  Yes (Comment)  Care giving concerns:  Patient did not express any concerns about returning back to family care home.   Social Worker assessment / plan: Patient is a 47 year old male [facility]  Patient has been living at family care home for about 10 years.  Patient states he likes where he is living and he is hopeful that he is able to return soon.   Patient expressed that he would like to go back, and he feels that the staff treat him well.  Patient did not have any comlaints about returning back to the family care home.  Employment status:  Disabled (Comment on whether or not currently receiving Disability) Insurance informatl  Patient/Family's Understanding of and Emotional Response to Diagnosis, Current Treatment, and Prognosis: Patient expressed that he is looking forward to returning back to his group home.  Emotional Assessment Appearance:   Appears stated age Attitude/Demeanor/Rapport:    Affect (typically observed):  Appropriate, Calm Orientation:  Oriented to Self, Oriented to Place, Oriented to Situation Alcohol / Substance use:  Not Applicable Psych involvement (Current and /or in the community):  No (Comment)  Discharge Needs  Concerns to be addressed:  Care Coordination Readmission within the last 30 days:  No Current discharge risk:  Cognitively Impaired Barriers to Discharge:  Continued Medical Work up   [place] Constablenterhaus, Jared Mayer, LCSWA 03/30/2018, 4:44 PM

## 2018-03-30 NOTE — Clinical Social Work Note (Signed)
Clinical Social Work Assessment  Patient Details  Name: Jared HedgerDaniel Mayer MRN: [id number] Date of Birth: 12/14/1970  Date of referral:  03/30/18               Reason for consult:  Facility Placement                Permission sought to share information with:  Facility Medical sales representativeContact Representative, Family Supports Permission granted to share information::  Yes, Verbal Permission Granted  Name::     [facility],[name] [phone]  Agency::  Family Care Home  Relationship::     Contact Information:     Housing/Transportation Living arrangements for the past 2 months:  Group Home Source of Information:  Patient, Medical Team Patient Interpreter Needed:  None Criminal Activity/Legal Involvement Pertinent to Current Situation/Hospitalization:  No - Comment as needed Significant Relationships:  Other Family Members, Community Support Lives with:  Facility Resident Do you feel safe going back to the place where you live?  Yes Need for family participation in patient care:  Yes (Comment)  Care giving concerns:  Patient did not express any concerns about returning back to family care home.  Social Worker assessment / plan:  Patient is a 47 year old male who [facility].  Patient has been living at family care home for about 10 years.  Patient states he likes where he is living and he is hopeful that he is able to return soon.   Patient expressed that he would like to go back, and he feels that the staff treat him well.  Patient did not have any comlaints about returning back to the family care home.  Employment status:  Disabled (Comment on whether or not currently receiving Disability) Insurance information:  [place]ic[place], [place] Recommend[place]:  Not assessed at this time Information / Referral to community resources:     Patient/Family's Response to care:  Patient expressed that he is looking forward to returning back  to his group home. Patient/Family's Understanding of and Emotional Response to Diagnosis, Current Treatment, and Prognosis:  Patient expressed that he is hopeful that he will not have to be in hospital much longer.  Patient is looking forward to returning back to his family care home.  Emotional Assessment Appearance:  Appears stated age Attitude/Demeanor/Rapport:    Affect (typically observed):  Appropriate, Calm Orientation:  Oriented to Self, Oriented to Place, Oriented to Situation Alcohol / Substance use:  Not Applicable Psych involvement (Current and /or in the community):  No (Comment)  Discharge Needs  Concerns to be addressed:  Care Coordination Readmission within the last 30 days:  No Current discharge risk:  Cognitively Impaired Barriers to Discharge:  Continued Medical Work up   [place] Constablenterhaus, Tori Dattilio R, LCSWA 03/30/2018, 5:19 PM

## 2018-03-31 LAB — BASIC METABOLIC PANEL
Anion gap: 6 (ref 5–15)
BUN: 15 mg/dL (ref 6–20)
CHLORIDE: 113 mmol/L — AB (ref 98–111)
CO2: 22 mmol/L (ref 22–32)
Calcium: 7.7 mg/dL — ABNORMAL LOW (ref 8.9–10.3)
Creatinine, Ser: 0.7 mg/dL (ref 0.61–1.24)
GFR calc Af Amer: >60 mL/min (ref >60)
GFR calc non Af Amer: >60 mL/min (ref >60)
GLUCOSE: 79 mg/dL (ref 70–99)
POTASSIUM: 3.4 mmol/L — AB (ref 3.5–5.1)
SODIUM: 141 mmol/L (ref 135–145)

## 2018-03-31 LAB — URINE CULTURE: Culture: NO GROWTH

## 2018-03-31 LAB — CBC
HCT: 37 % — ABNORMAL LOW (ref 39.0–52.0)
HEMOGLOBIN: 12.4 g/dL — AB (ref 13.0–17.0)
MCH: 33.1 pg (ref 26.0–34.0)
MCHC: 33.5 g/dL (ref 30.0–36.0)
MCV: 98.7 fL (ref 80.0–100.0)
Platelets: 111 10*3/uL — ABNORMAL LOW (ref 150–400)
RBC: 3.75 MIL/uL — ABNORMAL LOW (ref 4.22–5.81)
RDW: 13.5 % (ref 11.5–15.5)
WBC: 9.8 10*3/uL (ref 4.0–10.5)
nRBC: 0 % (ref 0.0–0.2)

## 2018-03-31 LAB — HIV ANTIBODY (ROUTINE TESTING W REFLEX): HIV Screen 4th Generation wRfx: NONREACTIVE

## 2018-03-31 LAB — PROCALCITONIN: PROCALCITONIN: 1.83 ng/mL

## 2018-03-31 MED ORDER — POTASSIUM CHLORIDE CRYS ER 20 MEQ PO TBCR
20.0000 meq | EXTENDED_RELEASE_TABLET | Freq: Two times a day (BID) | ORAL | Status: AC
  Administered 2018-03-31 (×2): 20 meq via ORAL
  Filled 2018-03-31 (×2): qty 1

## 2018-03-31 MED ORDER — OMEGA-3-ACID ETHYL ESTERS 1 G PO CAPS
1.0000 g | ORAL_CAPSULE | Freq: Two times a day (BID) | ORAL | Status: DC
  Administered 2018-03-31 – 2018-04-01 (×3): 1 g via ORAL
  Filled 2018-03-31 (×3): qty 1

## 2018-03-31 MED ORDER — SODIUM CHLORIDE 0.9 % IV SOLN
500.0000 mg | INTRAVENOUS | Status: DC
  Administered 2018-03-31: 14:00:00 500 mg via INTRAVENOUS
  Filled 2018-03-31 (×2): qty 500

## 2018-03-31 NOTE — Progress Notes (Signed)
[employer] -  at [facility]                                                                                                                                                                                  Patient Demographics   Jared Mayer, is a 47 y.o. male, DOB - Oct 12, 1970, FAO:[id number]  Admit date - 03/29/2018   Admitting Physician Houston SirenVivek J Sainani, MD  Outpatient Primary MD for the patient is Jerl MinaHedrick, James, MD   LOS - 2  Subjective: Doing better had low-grade fever yesterday    Review of Systems:   CONSTITUTIONAL: Patient's limited historian Vitals:   Vitals:   03/30/18 1857 03/30/18 1941 03/31/18 0418 03/31/18 0755  BP: 106/63 96/66 (!) 104/59 102/64  Pulse: (!) 111 94 85 86  Resp:      Temp:  (!) 100.9 F (38.3 C) 99.1 F (37.3 C) (!) 97.5 F (36.4 C)  TempSrc:  Axillary Axillary Oral  SpO2:  95% 98% 97%  Weight:      Height:        Wt Readings from Last 3 Encounters:  03/29/18 81.6 kg     Intake/Output Summary (Last 24 hours) at 03/31/2018 1228 Last data filed at 03/31/2018 1017 Gross per 24 hour  Intake 2994.86 ml  Output -  Net 2994.86 ml    Physical Exam:   GENERAL: Pleasant-appearing in no apparent distress.  HEAD, EYES, EARS, NOSE AND THROAT: Atraumatic, normocephalic. Extraocular muscles are intact. Pupils equal and reactive to light. Sclerae anicteric. No conjunctival injection. No oro-pharyngeal erythema.  NECK: Supple. There is no jugular venous distention. No bruits, no lymphadenopathy, no thyromegaly.  HEART: Regular rate and rhythm,. No murmurs, no rubs, no clicks.  LUNGS: Clear to auscultation bilaterally. No rales or rhonchi. No wheezes.  ABDOMEN: Soft, flat, nontender, nondistended. Has good bowel sounds. No hepatosplenomegaly appreciated.  EXTREMITIES: No evidence of any cyanosis, clubbing, or peripheral edema.  +2 pedal and radial pulses bilaterally.  NEUROLOGIC: More awake moving all extremities SKIN:  Moist and warm with no rashes appreciated.  Psych: Not anxious, depressed LN: No inguinal LN enlargement    Antibiotics   Anti-infectives (From admission, onward)   Start     Dose/Rate Route Frequency Ordered Stop   03/30/18 1900  cefTRIAXone (ROCEPHIN) 1 g in sodium chloride 0.9 % 100 mL IVPB     1 g 200 mL/hr over 30 Minutes Intravenous Every 24 hours 03/29/18 2155     03/29/18 1945  azithromycin (ZITHROMAX) 500 mg in sodium chloride 0.9 % 250 mL IVPB     500 mg 250 mL/hr over 60 Minutes Intravenous  Once 03/29/18 1931 03/29/18 2114  03/29/18 1930  cefTRIAXone (ROCEPHIN) 1 g in sodium chloride 0.9 % 100 mL IVPB     1 g 200 mL/hr over 30 Minutes Intravenous  Once 03/29/18 1927 03/29/18 2251      Medications   Scheduled Meds: . atorvastatin  40 mg Oral Daily  . buPROPion  300 mg Oral Daily  . cholecalciferol  1,000 Units Oral Daily  . divalproex  2,000 mg Oral QHS  . divalproex  500 mg Oral q morning - 10a  . docusate sodium  100 mg Oral BID  . enoxaparin (LOVENOX) injection  40 mg Subcutaneous Q24H  . lamoTRIgine  100 mg Oral Daily  . levothyroxine  88 mcg Oral Q0600  . linaclotide  145 mcg Oral Daily  . OLANZapine  30 mg Oral QHS  . omega-3 acid ethyl esters  1 g Oral BID  . pantoprazole  40 mg Oral Daily  . potassium chloride  20 mEq Oral BID  . sucralfate  1 g Oral BID  . topiramate  75 mg Oral BID   Continuous Infusions: . sodium chloride 1,000 mL (03/31/18 0940)  . cefTRIAXone (ROCEPHIN)  IV 1 g (03/30/18 1814)   PRN Meds:.acetaminophen **OR** acetaminophen, fluticasone, loratadine, ondansetron **OR** ondansetron (ZOFRAN) IV, triamcinolone cream   Data Review:   Micro Results Recent Results (from the past 240 hour(s))  Culture, blood (Routine x 2)     Status: None (Preliminary result)   Collection Time: 03/29/18  6:40 PM  Result Value Ref Range Status   Specimen Description BLOOD Blood Culture adequate volume  Final   Special Requests   Final    BOTTLES  DRAWN AEROBIC AND ANAEROBIC RIGHT ANTECUBITAL   Culture   Final    NO GROWTH 2 DAYS Performed at [facility], [address], [place], [place] [address]    Report Status PENDING  Incomplete  Culture, blood (Routine x 2)     Status: None (Preliminary result)   Collection Time: 03/29/18  6:40 PM  Result Value Ref Range Status   Specimen Description BLOOD Blood Culture adequate volume  Final   Special Requests   Final    BOTTLES DRAWN AEROBIC AND ANAEROBIC BLOOD LEFT FOREARM   Culture   Final    NO GROWTH 2 DAYS Performed at [facility], [address], [place], [place] [address]    Report Status PENDING  Incomplete  Urine culture     Status: None   Collection Time: 03/29/18  7:28 PM  Result Value Ref Range Status   Specimen Description   Final    URINE, RANDOM Performed at [facility], [address] [place], [place] [address]    Special Requests   Final    NONE Performed at [facility], [address], [place], [place] [address]    Culture   Final    NO GROWTH Performed at [facility], [address] [place], [place] [address]    Report Status 03/31/2018 FINAL  Final  MRSA PCR Screening     Status: None   Collection Time: 03/29/18 11:03 PM  Result Value Ref Range Status   MRSA by PCR NEGATIVE NEGATIVE Final    Comment:        The GeneXpert MRSA Assay (FDA approved for NASAL specimens only), is one component of a comprehensive MRSA colonization surveillance program. It is not intended to diagnose MRSA infection nor to guide or monitor treatment for MRSA infections. Performed at [facility],  [address], [place], [place] [address]     Radiology Reports Dg Chest 2 View  Result Date: 03/29/2018 CLINICAL DATA:  Confusion EXAM: CHEST - 2 VIEW COMPARISON:  None. FINDINGS: Mild opacity in left base with obscuration left hemidiaphragm. The heart, hila, mediastinum, lungs, and pleura are otherwise  unremarkable. IMPRESSION: Mild opacity in left base obscuring left hemidiaphragm may represent atelectasis. Early infiltrate not excluded. Recommend follow-up to resolution. Electronically Signed   By: Gerome Sam III M.D   On: 03/29/2018 19:27     CBC Recent Labs  Lab 03/29/18 1840 03/30/18 0434 03/31/18 0617  WBC 17.4* 14.1* 9.8  HGB 15.1 13.0 12.4*  HCT 43.4 39.5 37.0*  PLT 182 142* 111*  MCV 94.6 98.5 98.7  MCH 32.9 32.4 33.1  MCHC 34.8 32.9 33.5  RDW 13.2 13.4 13.5  LYMPHSABS 0.7  --   --   MONOABS 2.3*  --   --   EOSABS 0.0  --   --   BASOSABS 0.0  --   --     Chemistries  Recent Labs  Lab 03/29/18 1840 03/30/18 0434 03/31/18 0617  NA 135 140 141  K 3.3* 3.7 3.4*  CL 106 114* 113*  CO2 18* 21* 22  GLUCOSE 127* 82 79  BUN 19 13 15   CREATININE 1.29* 0.98 0.70  CALCIUM 8.7* 7.3* 7.7*  AST 33  --   --   ALT 21  --   --   ALKPHOS 52  --   --   BILITOT 0.9  --   --    ------------------------------------------------------------------------------------------------------------------ estimated creatinine clearance is 116.7 mL/min (by C-G formula based on SCr of 0.7 mg/dL). ------------------------------------------------------------------------------------------------------------------ No results for input(s): HGBA1C in the last 72 hours. ------------------------------------------------------------------------------------------------------------------ No results for input(s): CHOL, HDL, LDLCALC, TRIG, CHOLHDL, LDLDIRECT in the last 72 hours. ------------------------------------------------------------------------------------------------------------------ No results for input(s): TSH, T4TOTAL, T3FREE, THYROIDAB in the last 72 hours.  Invalid input(s): FREET3 ------------------------------------------------------------------------------------------------------------------ No results for input(s): VITAMINB12, FOLATE, FERRITIN, TIBC, IRON, RETICCTPCT in the last 72  hours.  Coagulation profile Recent Labs  Lab 03/29/18 1840  INR 1.07    No results for input(s): DDIMER in the last 72 hours.  Cardiac Enzymes No results for input(s): CKMB, TROPONINI, MYOGLOBIN in the last 168 hours.  Invalid input(s): CK ------------------------------------------------------------------------------------------------------------------ Invalid input(s): POCBNP    Assessment & Plan   47 year old male with past medical history of MR, schizophrenia, hyperlipidemia, hypothyroidism who presents from the group home due to altered mental status, weakness.  1.  Sepsis- due to pneumonia rather than urine urine cultures negative Continue IV ceftriaxone I will add azithromycin for atypical coverage  2.    Cystitis, urinary source ruled out as a source of sepsis  3.  Altered mental status- metabolic encephalopathy secondary infection now much improved   4.  Hypokalemia- being replaced.  5.  History of schizophrenia/MR-continue Depakote, Wellbutrin, Zyprexa, Topamax, Linzess.  6.  Hypothyroidism-continue Synthroid.  7.  Hyperlipidemia-continue atorvastatin.      Code Status Orders  (From admission, onward)         Start     Ordered   03/29/18 2156  Full code  Continuous     03/29/18 2155        Code Status History    This patient has a current code status but no historical code status.           Consults  none  DVT Prophylaxis  Lovenox    Lab Results  Component  Value Date   PLT 111 (L) 03/31/2018     Time Spent in minutes  Greater than 50% of time spent in care coordination and counseling patient regarding the condition and plan of care.   Auburn Bilberry M.D on 03/31/2018 at 12:28 PM  Between 7am to 6pm - Pager - [phone]  After 6pm go to [email or web address] - Social research officer, government  [employer]   Office  [phone]

## 2018-03-31 NOTE — Evaluation (Signed)
Physical Therapy Evaluation Patient Details Name: Jared Mayer MRN: [id number] DOB: 06/10/1970 Today's Date: 03/31/2018   History of Present Illness  Jared Mayer comes to [facility] on 11/19 c AMS and weakness, admitted with UTI. PMH: schizophrenia, MR, hypoTSH, UTI. At baseline, tpt lives in a group home and has no basic mobility limitations.   Clinical Impression  Pt admitted with above diagnosis. Pt currently with functional limitations due to the deficits listed below (see "PT Problem List"). Upon entry, pt in bed, sister present, father stops in briefly (works at [employer]). The pt is awake and agreeable to participate. The pt is alert and oriented to self, pleasant, conversational, and following simple commands consistently. Family denies any AMS during this episode, reports the patient to be at his baseline. Pt perorms all mobility at supervision level or better, but expresses fatigue at end of AMB trial; desires napping at end of session. Functional mobility assessment demonstrates increased effort/time requirements, limited tolerance, but no frank need for physical assistance, whereas the patient performed these at a higher level of independence PTA. Pt will benefit from skilled PT intervention to increase independence and safety with basic mobility in preparation for discharge to the venue listed below.       Follow Up Recommendations Home health PT    Equipment Recommendations  None recommended by PT    Recommendations for Other Services       Precautions / Restrictions Precautions Precautions: None Restrictions Weight Bearing Restrictions: No      Mobility  Bed Mobility Overal bed mobility: Needs Assistance Bed Mobility: Supine to Sit     Supine to sit: Supervision     General bed mobility comments: verbally cued  Transfers Overall transfer level: Needs assistance Equipment used: None Transfers: Sit to/from Stand Sit to Stand: Supervision;Min guard         General  transfer comment: no LOB, generally low effort, but additional time required. REluctant to sit on low toilet surface later, but agreeable to sit on [facility]  Ambulation/Gait Ambulation/Gait assistance: Supervision Gait Distance (Feet): 180 Feet Assistive device: None Gait Pattern/deviations: WFL(Within Functional Limits)     General Gait Details: quality at baseline, speed and tolerance moderately impaired.   Stairs            Wheelchair Mobility    Modified Rankin (Stroke Patients Only)       Balance Overall balance assessment: Modified Independent                                           Pertinent Vitals/Pain Pain Assessment: No/denies pain    Home Living Family/patient expects to be discharged to:: Group home                      Prior Function Level of Independence: Needs assistance   Gait / Transfers Assistance Needed: independent  ADL's / Homemaking Assistance Needed: supervision; baseline urge incontinence, not consistently vigilant about voicing needs without cuing.   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 )  Communication   Communication: No difficulties  Cognition Arousal/Alertness: Awake/alert Behavior During Therapy: WFL for tasks assessed/performed Overall Cognitive Status: History of cognitive impairments - at baseline(WFL for tasks assessed. )                                        General Comments      Exercises     Assessment/Plan    PT Assessment Patient needs continued PT services  PT Problem List Decreased  strength;Decreased activity tolerance;Decreased mobility       PT Treatment Interventions Therapeutic activities;Functional mobility training;Therapeutic exercise;Patient/family education    PT Goals (Current goals can be found in the Care Plan section)  Acute Rehab PT Goals Patient Stated Goal: regain strength and independent mobility.  PT Goal Formulation: With family Time For Goal Achievement: 04/14/18 Potential to Achieve Goals: Good    Frequency Min 2X/week   Barriers to discharge        Co-evaluation               AM-PAC PT "6 Clicks" Daily Activity  Outcome Measure Difficulty turning over in bed (including adjusting bedclothes, sheets and blankets)?: Unable Difficulty moving from lying on back to sitting on the side of the bed? : Unable Difficulty sitting down on and standing up from a chair with arms (e.g., wheelchair, bedside commode, etc,.)?: Unable Help needed moving to and from a bed to chair (including a wheelchair)?: A Little Help needed walking in hospital room?: A Little Help needed climbing 3-5 steps with a railing? : A Little 6 Click Score: 12    End of Session Equipment Utilized During Treatment: Gait belt Activity Tolerance: Patient limited by fatigue;No increased pain Patient left: in chair;with nursing/sitter in room;with family/visitor present;with call bell/phone within reach Nurse Communication: Mobility status PT Visit Diagnosis: Difficulty in walking, not elsewhere classified (R26.2);Other abnormalities of gait and mobility (R26.89)    Time: [id number] PT Time Calculation (min) (ACUTE ONLY): 24 min   Charges:   PT Evaluation $PT Eval Low Complexity: 1 Low PT Treatments $Therapeutic Activity: 8-22 mins        2:48 PM, 03/31/18 Rosamaria LintsAllan C Faatima Tench, PT, DPT Physical Therapist - [facility]  [phone] (ASCOM)    Jemari Hallum C 03/31/2018, 2:46 PM

## 2018-04-01 LAB — BASIC METABOLIC PANEL
ANION GAP: 3 — AB (ref 5–15)
BUN: 12 mg/dL (ref 6–20)
CALCIUM: 7.5 mg/dL — AB (ref 8.9–10.3)
CO2: 22 mmol/L (ref 22–32)
CREATININE: 0.58 mg/dL — AB (ref 0.61–1.24)
Chloride: 116 mmol/L — ABNORMAL HIGH (ref 98–111)
GFR calc Af Amer: >60 mL/min (ref >60)
GFR calc non Af Amer: >60 mL/min (ref >60)
GLUCOSE: 80 mg/dL (ref 70–99)
Potassium: 3.4 mmol/L — ABNORMAL LOW (ref 3.5–5.1)
Sodium: 141 mmol/L (ref 135–145)

## 2018-04-01 LAB — CBC
HEMATOCRIT: 34.9 % — AB (ref 39.0–52.0)
Hemoglobin: 11.6 g/dL — ABNORMAL LOW (ref 13.0–17.0)
MCH: 32.1 pg (ref 26.0–34.0)
MCHC: 33.2 g/dL (ref 30.0–36.0)
MCV: 96.7 fL (ref 80.0–100.0)
Platelets: 120 10*3/uL — ABNORMAL LOW (ref 150–400)
RBC: 3.61 MIL/uL — ABNORMAL LOW (ref 4.22–5.81)
RDW: 13.5 % (ref 11.5–15.5)
WBC: 7.6 10*3/uL (ref 4.0–10.5)
nRBC: 0 % (ref 0.0–0.2)

## 2018-04-01 MED ORDER — AZITHROMYCIN 250 MG PO TABS: ORAL_TABLET | ORAL | 0 refills | Status: DC

## 2018-04-01 MED ORDER — CEFUROXIME AXETIL 500 MG PO TABS: 500.0000 mg | ORAL_TABLET | Freq: Two times a day (BID) | ORAL | 0 refills | Status: AC

## 2018-04-01 NOTE — [place] FL2 (Signed)
[place] MEDICAID FL2 LEVEL OF CARE SCREENING TOOL     IDENTIFICATION  Patient Name: Jared HedgerDaniel Wentzel Birthdate: 1970/11/26 Sex: male Admission Date (Current Location): 03/29/2018  [place]:  [place] [id number] [facility] Facility and Address:  [facility], [address], [place], [place] [address]      Provider Number: [id number]  Attending Physician Name and Address:  Auburn BilberryPatel, Shreyang, MD  Relative Name and Phone Number:       Current Level of Care: Hospital Recommended Level of Care: Family Care Home Prior Approval Number:    Date Approved/Denied:   PASRR Number:    Discharge Plan: Other (Comment)(Family Care Home )    Current Diagnoses: Patient Active Problem List   Diagnosis Date Noted  . UTI (urinary tract infection) 03/29/2018    Orientation RESPIRATION BLADDER Height & Weight     Self, Place  Normal Incontinent Weight: 180 lb (81.6 kg) Height:  5\' 7"  (170.2 cm)  BEHAVIORAL SYMPTOMS/MOOD NEUROLOGICAL BOWEL NUTRITION STATUS  (none) (none) Continent Diet(Regular Diet )  AMBULATORY STATUS COMMUNICATION OF NEEDS Skin   Limited Assist Verbally Normal                       Personal Care Assistance Level of Assistance  Bathing, Feeding, Dressing Bathing Assistance: Limited assistance Feeding assistance: Independent Dressing Assistance: Limited assistance     Functional Limitations Info  Sight, Hearing, Speech Sight Info: Adequate Hearing Info: Adequate Speech Info: Adequate    SPECIAL CARE FACTORS FREQUENCY                       Contractures Contractures Info: Not present    Additional Factors Info  Code Status, Allergies Code Status Info: Full Code  Allergies Info: NKA Psychotropic Info: buPROPion (WELLBUTRIN XL) 24 hr tablet 300 mg and divalproex (DEPAKOTE ER) 24 hr tablet 2,000 mg and divalproex (DEPAKOTE ER) 24 hr tablet 500 mg and OLANZapine (ZYPREXA) tablet 30 mg          Current Medications  (04/01/2018):  This is the current hospital active medication list Current Facility-Administered Medications  Medication Dose Route Frequency Provider Last Rate Last Dose  . 0.9 %  sodium chloride infusion  1,000 mL Intravenous Continuous Jeanmarie PlantMcShane, James A, MD 125 mL/hr at 04/01/18 0259 1,000 mL at 04/01/18 0259  . acetaminophen (TYLENOL) tablet 650 mg  650 mg Oral Q6H PRN Houston SirenSainani, Vivek J, MD   650 mg at 03/30/18 [id number]   Or  . acetaminophen (TYLENOL) suppository 650 mg  650 mg Rectal Q6H PRN Houston SirenSainani, Vivek J, MD      . atorvastatin (LIPITOR) tablet 40 mg  40 mg Oral Daily Houston SirenSainani, Vivek J, MD   40 mg at 03/31/18 1021  . azithromycin (ZITHROMAX) 500 mg in sodium chloride 0.9 % 250 mL IVPB  500 mg Intravenous Q24H Auburn BilberryPatel, Shreyang, MD   Stopped at 03/31/18 1434  . buPROPion (WELLBUTRIN XL) 24 hr tablet 300 mg  300 mg Oral Daily Houston SirenSainani, Vivek J, MD   300 mg at 03/31/18 1022  . cefTRIAXone (ROCEPHIN) 1 g in sodium chloride 0.9 % 100 mL IVPB  1 g Intravenous Q24H Houston SirenSainani, Vivek J, MD   Stopped at 03/31/18 1822  . cholecalciferol (VITAMIN D3) tablet 1,000 Units  1,000 Units Oral Daily Houston SirenSainani, Vivek J, MD   1,000 Units at 03/31/18 1021  . divalproex (DEPAKOTE ER) 24 hr tablet 2,000 mg  2,000 mg Oral QHS Sainani, Vivek J,  MD   2,000 mg at 03/31/18 2127  . divalproex (DEPAKOTE ER) 24 hr tablet 500 mg  500 mg Oral q morning - 10a Sainani, Rolly Pancake, MD   500 mg at 03/31/18 1022  . enoxaparin (LOVENOX) injection 40 mg  40 mg Subcutaneous Q24H Houston Siren, MD   40 mg at 03/31/18 1022  . fluticasone (FLONASE) 50 MCG/ACT nasal spray 2 spray  2 spray Each Nare Daily PRN Houston Siren, MD      . lamoTRIgine (LAMICTAL) tablet 100 mg  100 mg Oral Daily Houston Siren, MD   100 mg at 03/31/18 1022  . levothyroxine (SYNTHROID, LEVOTHROID) tablet 88 mcg  88 mcg Oral Q0600 Houston Siren, MD   88 mcg at 04/01/18 0554  . linaclotide (LINZESS) capsule 145 mcg  145 mcg Oral Daily Houston Siren, MD   145 mcg at  03/31/18 1022  . loratadine (CLARITIN) tablet 10 mg  10 mg Oral Daily PRN Houston Siren, MD      . OLANZapine (ZYPREXA) tablet 30 mg  30 mg Oral QHS Houston Siren, MD   30 mg at 03/31/18 2126  . omega-3 acid ethyl esters (LOVAZA) capsule 1 g  1 g Oral BID Auburn Bilberry, MD   1 g at 03/31/18 2125  . ondansetron (ZOFRAN) tablet 4 mg  4 mg Oral Q6H PRN Houston Siren, MD       Or  . ondansetron (ZOFRAN) injection 4 mg  4 mg Intravenous Q6H PRN Houston Siren, MD      . pantoprazole (PROTONIX) EC tablet 40 mg  40 mg Oral Daily Houston Siren, MD   40 mg at 03/31/18 1021  . sucralfate (CARAFATE) tablet 1 g  1 g Oral BID Houston Siren, MD   1 g at 03/31/18 2125  . topiramate (TOPAMAX) tablet 75 mg  75 mg Oral BID Houston Siren, MD   75 mg at 03/31/18 2126  . triamcinolone cream (KENALOG) 0.1 % 1 application  1 application Topical BID PRN Houston Siren, MD         Discharge Medications: Please see discharge summary for a list of discharge medications.  Relevant Imaging Results:  Relevant Lab Results:   Additional Information SSN [id number]  Ruthe Mannan, [place]

## 2018-04-01 NOTE — Care Management (Signed)
[facility]. Was the only agency that would accept medicaid today

## 2018-04-01 NOTE — Progress Notes (Signed)
Patient discharged. All discharge instructions in packet. Stepfather will transport.

## 2018-04-01 NOTE — Clinical Social Work Note (Signed)
Patient is medically ready for discharge today. CSW notified patient's guardian Marliss CzarLinda [name] [phone]. Bonita QuinLinda is currently working so her husband will be transporting patient back to group home. CSW completed discharge packet with FL2 and summary. CSW signing off. Please re consult if further needs arise.   Ruthe Mannanandace Kimmarie Pascale MSW, [address] [id number]

## 2018-04-01 NOTE — Discharge Summary (Signed)
[facility] - [place] [facility], 47 y.o., DOB 1970-09-14, MRN [id number]. Admission date: 03/29/2018 Discharge Date 04/01/2018 Primary MD [name], MD Admitting Physician Houston Siren, MD  Admission Diagnosis  Fever, unspecified fever cause [R50.9]  Discharge Diagnosis   Active Problems: Sepsis due to pneumonia Community acquired pneumonia Cystitis Acute encephalopathy due to pneumonia Hypokalemia History of schizophrenia/MR Hypothyroidism Hyperlipidemia  Hospital Course 47 year old male with past medical history of MR, schizophrenia, hyperlipidemia, hypothyroidism who presents from the group home due to altered mental status, weakness.  Patient also was noted to have fever.  Initial evaluation showed that he may have a UTI however his chest x-ray was abnormal.  Further evaluation revealed patient had pneumonia.  He was treated with antibiotics.  He was initially confused now back to baseline.  Patient's fevers have subsided his WBC count is normal.  Patient stable to return back to his group home.             Consults  None  Significant Tests:  See full reports for all details     Dg Chest 2 View  Result Date: 03/29/2018 CLINICAL DATA:  Confusion EXAM: CHEST - 2 VIEW COMPARISON:  None. FINDINGS: Mild opacity in left base with obscuration left hemidiaphragm. The heart, hila, mediastinum, lungs, and pleura are otherwise unremarkable. IMPRESSION: Mild opacity in left base obscuring left hemidiaphragm may represent atelectasis. Early infiltrate not excluded. Recommend follow-up to resolution. Electronically Signed   By: Gerome Sam III M.D   On: 03/29/2018 19:27       Today   Subjective:   Jared Mayer patient denies any complaints  Objective:   Blood pressure (!) 113/56, pulse 91, temperature 99.6 F (37.6 C), temperature source Axillary, resp. rate 20, height 5\' 7"  (1.702 m), weight 81.6 kg, SpO2 94 %.   .  Intake/Output Summary (Last 24 hours) at 04/01/2018 0905 Last data filed at 04/01/2018 0300 Gross per 24 hour  Intake 4054.31 ml  Output -  Net 4054.31 ml    Exam VITAL SIGNS: Blood pressure (!) 113/56, pulse 91, temperature 99.6 F (37.6 C), temperature source Axillary, resp. rate 20, height 5\' 7"  (1.702 m), weight 81.6 kg, SpO2 94 %.  GENERAL:  47 y.o.-year-old patient lying in the bed with no acute distress.  EYES: Pupils equal, round, reactive to light and accommodation. No scleral icterus. Extraocular muscles intact.  HEENT: Head atraumatic, normocephalic. Oropharynx and nasopharynx clear.  NECK:  Supple, no jugular venous distention. No thyroid enlargement, no tenderness.  LUNGS: Normal breath sounds bilaterally, no wheezing, rales,rhonchi or crepitation. No use of accessory muscles of respiration.  CARDIOVASCULAR: S1, S2 normal. No murmurs, rubs, or gallops.  ABDOMEN: Soft, nontender, nondistended. Bowel sounds present. No organomegaly or mass.  EXTREMITIES: No pedal edema, cyanosis, or clubbing.  NEUROLOGIC: Cranial nerves II through XII are intact. Muscle strength 5/5 in all extremities. Sensation intact. Gait not checked.  PSYCHIATRIC: The patient is alert and oriented x 3.  SKIN: No obvious rash, lesion, or ulcer.   Data Review     CBC w Diff:  Lab Results  Component Value Date   WBC 7.6 04/01/2018   HGB 11.6 (L) 04/01/2018   HCT 34.9 (L) 04/01/2018   PLT 120 (L) 04/01/2018   LYMPHOPCT 4 03/29/2018   MONOPCT 13 03/29/2018   EOSPCT 0 03/29/2018   BASOPCT 0 03/29/2018   CMP:  Lab Results  Component Value Date   NA 141 04/01/2018   K 3.4 (  L) 04/01/2018   CL 116 (H) 04/01/2018   CO2 22 04/01/2018   BUN 12 04/01/2018   CREATININE 0.58 (L) 04/01/2018   PROT 6.6 03/29/2018   ALBUMIN 3.4 (L) 03/29/2018   BILITOT 0.9 03/29/2018   ALKPHOS 52 03/29/2018   AST 33 03/29/2018   ALT 21 03/29/2018  .  Micro Results Recent Results (from the past 240 hour(s))   Culture, blood (Routine x 2)     Status: None (Preliminary result)   Collection Time: 03/29/18  6:40 PM  Result Value Ref Range Status   Specimen Description BLOOD Blood Culture adequate volume  Final   Special Requests   Final    BOTTLES DRAWN AEROBIC AND ANAEROBIC RIGHT ANTECUBITAL   Culture   Final    NO GROWTH 3 DAYS Performed at [facility], [address] [place], [place] [address]    Report Status PENDING  Incomplete  Culture, blood (Routine x 2)     Status: None (Preliminary result)   Collection Time: 03/29/18  6:40 PM  Result Value Ref Range Status   Specimen Description BLOOD Blood Culture adequate volume  Final   Special Requests   Final    BOTTLES DRAWN AEROBIC AND ANAEROBIC BLOOD LEFT FOREARM   Culture   Final    NO GROWTH 3 DAYS Performed at [facility], [address] [place], [place] [address]    Report Status PENDING  Incomplete  Urine culture     Status: None   Collection Time: 03/29/18  7:28 PM  Result Value Ref Range Status   Specimen Description   Final    URINE, RANDOM Performed at [facility], [address], [place], [place] [address]    Special Requests   Final    NONE Performed at [facility], [address] [place], [place] [address]    Culture   Final    NO GROWTH Performed at [facility], [address], [place], [place] [address]    Report Status 03/31/2018 FINAL  Final  MRSA PCR Screening     Status: None   Collection Time: 03/29/18 11:03 PM  Result Value Ref Range Status   MRSA by PCR NEGATIVE NEGATIVE Final    Comment:        The GeneXpert MRSA Assay (FDA approved for NASAL specimens only), is one component of a comprehensive MRSA colonization surveillance program. It is not intended to diagnose MRSA infection nor to guide or monitor treatment for MRSA infections. Performed at [facility], [address], [place], [place] [address]         Code Status Orders   (From admission, onward)         Start     Ordered   03/29/18 2156  Full code  Continuous     03/29/18 2155        Code Status History    This patient has a current code status but no historical code status.          Follow-up Information    [name], MD Follow up in 6 day(s).   Specialty:  Family Medicine Contact information: [address] [place] [address] [phone]           Discharge Medications   Allergies as of 04/01/2018   No Known Allergies     Medication List    TAKE these medications   atorvastatin 40 MG tablet Commonly known as:  LIPITOR  Take 40 mg by mouth daily.   azithromycin 250 MG tablet Commonly known as:  ZITHROMAX Take daily x 4 days   buPROPion 150 MG 24 hr tablet Commonly known as:  WELLBUTRIN XL Take 300 mg by mouth daily.   cefUROXime 500 MG tablet Commonly known as:  CEFTIN Take 1 tablet (500 mg total) by mouth 2 (two) times daily for 5 days.   cholecalciferol 25 MCG (1000 UT) tablet Commonly known as:  VITAMIN D3 Take 1,000 Units by mouth daily.   divalproex 500 MG 24 hr tablet Commonly known as:  DEPAKOTE ER Take 500-2,000 mg by mouth See admin instructions. 500 mg every morning and 2000 mg at bedtime   docusate sodium 100 MG capsule Commonly known as:  COLACE Take 100 mg by mouth 2 (two) times daily.   fexofenadine 180 MG tablet Commonly known as:  ALLEGRA Take 180 mg by mouth daily as needed for allergies.   Fish Oil 1000 MG Caps Take 1,000 mg by mouth 2 (two) times daily.   fluticasone 50 MCG/ACT nasal spray Commonly known as:  FLONASE Place 2 sprays into the nose daily as needed for allergies or rhinitis.   lamoTRIgine 100 MG tablet Commonly known as:  LAMICTAL Take 100 mg by mouth daily.   levothyroxine 88 MCG tablet Commonly known as:  SYNTHROID, LEVOTHROID Take 88 mcg by mouth daily.   linaclotide 145 MCG Caps capsule Commonly known as:  LINZESS Take 145 mcg  by mouth daily.   OLANZapine 15 MG tablet Commonly known as:  ZYPREXA Take 30 mg by mouth at bedtime.   omeprazole 20 MG capsule Commonly known as:  PRILOSEC Take 40 mg by mouth daily.   sodium fluoride 1.1 % Crea dental cream Commonly known as:  PREVIDENT 5000 PLUS Place 1 application onto teeth 2 (two) times daily.   sucralfate 1 g tablet Commonly known as:  CARAFATE Take 1 g by mouth 2 (two) times daily.   topiramate 25 MG tablet Commonly known as:  TOPAMAX Take 75 mg by mouth 2 (two) times daily.   triamcinolone cream 0.1 % Commonly known as:  KENALOG Apply 1 application topically 2 (two) times daily as needed (for inflammation/irritation).   triamcinolone ointment 0.1 % Commonly known as:  KENALOG Apply 1 application topically 2 (two) times daily as needed (for inflammation/irritation).          Total Time in preparing paper work, data evaluation and todays exam - 35 minutes  Auburn BilberryShreyang Aviv Lengacher M.D on 04/01/2018 at 9:05 AM [facility]   Office  [phone]

## 2018-04-03 LAB — CULTURE, BLOOD (ROUTINE X 2)
Culture: NO GROWTH
Culture: NO GROWTH
SPECIMEN DESCRIPTION: ADEQUATE
SPECIMEN DESCRIPTION: ADEQUATE

## 2018-11-24 ENCOUNTER — Other Ambulatory Visit: Payer: Self-pay | Admitting: Student

## 2018-11-24 DIAGNOSIS — R1312 Dysphagia, oropharyngeal phase: Secondary | ICD-10-CM

## 2019-01-04 ENCOUNTER — Other Ambulatory Visit: Payer: Self-pay

## 2019-01-04 ENCOUNTER — Ambulatory Visit
Admission: RE | Admit: 2019-01-04 | Discharge: 2019-01-04 | Disposition: A | Discharge status: Home / Self Care | Payer: Medicare Other | Source: Ambulatory Visit | Attending: Student | Admitting: Student

## 2019-01-04 DIAGNOSIS — R1312 Dysphagia, oropharyngeal phase: Secondary | ICD-10-CM | POA: Insufficient documentation

## 2019-01-04 NOTE — Therapy (Signed)
[place], [place], [address] Phone: [phone]   Fax:     Modified Barium Swallow  Patient Details  Name: Jared Mayer MRN: [id number] Date of Birth: 01-09-1971 No data recorded  Encounter Date: 01/04/2019  End of Session - 01/04/19 1448    Visit Number  1    Number of Visits  1    Date for SLP Re-Evaluation  01/04/19    SLP Start Time  60    SLP Stop Time   1315    SLP Time Calculation (min)  45 min    Activity Tolerance  Patient tolerated treatment well       Past Medical History:  Diagnosis Date  . Dyslipidemia   . Hypothyroid   . Manic behavior ([place])   . Mentally disabled   . Schizoaffective disorder, bipolar type ([place])     No past surgical history on file.  There were no vitals filed for this visit.   Subjective: Patient behavior: (alertness, ability to follow instructions, etc.): The patient was pleasant and cooperative for the study.  He cannot accurately follow directions, such as take a SMALL sip  Chief complaint: Choking/coughing reported by caregiver without known pulmonary compromise   Objective:  Radiological Procedure: A videoflouroscopic evaluation of oral-preparatory, reflex initiation, and pharyngeal phases of the swallow was performed; as well as a screening of the upper esophageal phase.  I. POSTURE: Upright in MBS chair  II. VIEW: Lateral  III. COMPENSATORY STRATEGIES: Patient cannot accurately follow instructions for compensatory strategies  IV. BOLUSES ADMINISTERED:   Thin Liquid: 1 moderate, 3 large rapid consecutive gulps   Nectar-thick Liquid: 1 moderate, 3 large rapid consecutive gulps    Honey-thick Liquid: DNT   Puree: 2 teaspoon presentations   Mechanical Soft: 1/4 graham cracker in applesauce  V. RESULTS OF EVALUATION: A. ORAL PREPARATORY PHASE: (The lips, tongue, and velum are observed for strength and coordination)       **Overall  Severity Rating: Moderate; very impulsive with drinking both thin and nectar-thick liquids, solid trials were managed by SLP  B. SWALLOW INITIATION/REFLEX: (The reflex is normal if "triggered" by the time the bolus reached the base of the tongue)  **Overall Severity Rating: Mild; triggers while falling from the valleculae to the pyriform sinuses with the larger liquid boluses  C. PHARYNGEAL PHASE: (Pharyngeal function is normal if the bolus shows rapid, smooth, and continuous transit through the pharynx and there is no pharyngeal residue after the swallow)  **Overall Severity Rating: Minimal; minimally reduced tongue base retraction with trace residue in the valleculae  D. LARYNGEAL PENETRATION: (Material entering into the laryngeal inlet/vestibule but not aspirated) Laryngeal penetration with trace laryngeal vestibule residue with large gulps thin and nectar-thick liquids  E. ASPIRATION: None observed, patient at risk of aspiration of laryngeal vestibule residue  F. ESOPHAGEAL PHASE: (Screening of the upper esophagus) could not visualize  ASSESSMENT: This 48 year old man; with concern for aspiration secondary frequent choking/coughing with meals per caregiver; is presenting with moderate oropharyngeal dysphagia characterized by impulsive eating/drinking behaviors, disorganized oral management, delayed pharyngeal swallow initiation, trace pharyngeal residue, and laryngeal penetration of thin and thick liquids when taking large consecutive drinks.  There is no observed tracheal aspiration, although he is at risk of trace aspiration of laryngeal vestibule residue.  Given his impulsive eating/drinking behaviors due to his cognitive status, the patient is at risk for choking and aspiration while eating/drinking.  For optimal management, I recommend getting a  home health speech therapy consult to assess in his usual home setting.  In the meantime, I recommend that his diet be soft and moist (mechanical soft  with gravies/sauces) and that he be discouraged the impulsive eating/drinking behaviors.  PLAN/RECOMMENDATIONS:   A. Diet: Mechanical soft with extra gravy/sauce for moisture, thin liquid- single sips if possible   B. Swallowing Precautions: Discourage impulsive eating/drinking   C. Recommended consultation to: follow up with MDs as recommended   D. Therapy recommendations: Home Health speech therapy consult to determine optimal diet consistency and feeding guidelines to manage maladaptive behaviors    E. Results and recommendations (preliminary results) were discussed with the patient's caregiver and the final report routed to the referring provider and the patient's group home.   Patient will benefit from skilled therapeutic intervention in order to improve the following deficits and impairments:   Oropharyngeal dysphagia - Plan: DG SWALLOW FUNC SPEECH PATH, DG SWALLOW FUNC SPEECH PATH        Problem List Patient Active Problem List   Diagnosis Date Noted  . UTI (urinary tract infection) 03/29/2018   Dollene PrimroseSusan G Lynda Wanninger, MS/CCC- SLP  Leandrew KoyanagiAbernathy, Susie 01/04/2019, 2:49 PM  [facility] [address] [place], [place], [address] Phone: [phone]   Fax:     Name: Jared HedgerDaniel Mayer MRN: [id number] Date of Birth: March 26, 1971

## 2019-05-10 ENCOUNTER — Encounter: Payer: Self-pay | Admitting: Urology

## 2019-05-10 ENCOUNTER — Ambulatory Visit (INDEPENDENT_AMBULATORY_CARE_PROVIDER_SITE_OTHER): Payer: Medicare Other | Admitting: Urology

## 2019-05-10 ENCOUNTER — Other Ambulatory Visit: Payer: Self-pay

## 2019-05-10 VITALS — BP 122/72 | HR 118 | Ht 68.0 in | Wt 183.0 lb

## 2019-05-10 DIAGNOSIS — R32 Unspecified urinary incontinence: Secondary | ICD-10-CM | POA: Diagnosis not present

## 2019-05-10 LAB — MICROSCOPIC EXAMINATION: RBC: NONE SEEN /hpf (ref 0–2)

## 2019-05-10 LAB — URINALYSIS, COMPLETE
Bilirubin, UA: NEGATIVE
Glucose, UA: NEGATIVE
Leukocytes,UA: NEGATIVE
Nitrite, UA: NEGATIVE
RBC, UA: NEGATIVE
Specific Gravity, UA: >1.03 — ABNORMAL HIGH (ref 1.005–1.030)
Urobilinogen, Ur: 1 mg/dL (ref 0.2–1.0)
pH, UA: 5.5 (ref 5.0–7.5)

## 2019-05-10 LAB — BLADDER SCAN AMB NON-IMAGING

## 2019-05-10 NOTE — Progress Notes (Signed)
05/10/19 9:09 AM   [name] May 08, 1971 [id number]  Referring provider: Maryland Pink, MD [address] [place],  [place] [address]  CC: Incontinence  HPI: I saw Mr. Jared Mayer in urology clinic in consultation from Dr. Kary Kos for nocturia and incontinence.  He is a 48 year old male with developmental delay and history of schizoaffective disorder who resides in a group home.  He is here with his guardian today who provides most of the history.  The patient cannot provide much of a history at all.  Reportedly, the group home states that he has been getting up once or twice overnight to pee and is also had some leakage overnight.  The volume of leakage is unclear.  He denies any trouble during the day or leakage during the day.  He reports he urinates with a good stream.  Per his guardian, he has a history of urosepsis approximately 15 years ago, but has had no trouble with UTIs since that time.  He denies any gross hematuria or history of kidney stones.  He has had multiple negative urinalysis over the last year.  He drinks a fair amount of fluids during the day and at night because many of his psych medications cause dry mouth.  He does report drinking tea and Kool-Aid and soda in the evening.  He had trouble with bedwetting until age 89, and has a family history of father and uncle having trouble with bedwetting.  The group home reportedly has been putting depends on all the residents as well which the guardian feels is contributing to his enuresis.  Urinalysis is benign appearing today with 0-5 WBCs, 0 RBCs, few bacteria, no yeast, nitrite negative, no leukocytes.  Renal function is normal with recent creatinine of 0.58, EGFR greater than 60.  PVR in clinic today 5 mL.  PMH: Past Medical History:  Diagnosis Date  . Dyslipidemia   . Hypothyroid   . Manic behavior ([place])   . Mentally disabled   . Schizoaffective disorder, bipolar type [facility])     Surgical History: No  past surgical history on file.   Allergies: No Known Allergies  Family History: Family History  Problem Relation Age of Onset  . Hypothyroidism Mother   . Sarcoidosis Mother     Social History:  reports that he has never smoked. He has never used smokeless tobacco. He reports that he does not drink alcohol or use drugs.  ROS: Please see flowsheet from today's date for complete review of systems.  Physical Exam: BP 122/72   Pulse (!) 118   Ht '5\' 8"'  (1.727 m)   Wt 183 lb (83 kg)   BMI 27.83 kg/m    Constitutional: Alert Cardiovascular: No clubbing, cyanosis, or edema. Respiratory: Normal respiratory effort, no increased work of breathing. GI: Abdomen is soft, nontender, nondistended, no abdominal masses Lymph: No cervical or inguinal lymphadenopathy. Skin: No rashes, bruises or suspicious lesions.  Laboratory Data: Reviewed, see HPI  Assessment & Plan:   In summary, the patient is a 48 year old male with severe developmental delay who resides in a group home who reports a few month history of nocturia 1-2 times overnight and some leakage overnight.  He denies any complaints during the day.  He is emptying his bladder well and urinalysis is benign.  I had a long conversation with the patient and his guardian today about the etiology of his nocturia and enuresis.  We discussed nocturia 1-2 times per night is normal.  We discussed  behavioral strategies of minimizing fluids in the evening, avoiding coffee/soda/tea/juices before bed, and urinating prior to bed.  If he is having persistent enuresis despite these measures, would recommend use of a bed alarm.  Finally, I get the sense from his guardian that there have been some challenges with the care from the group home, and there may be a social component as well.  Follow-up as needed  A total of 60 minutes were spent face-to-face with the patient, greater than 50% was spent in patient education, counseling, and coordination of  care regarding nocturia and enuresis.   Billey Co, [facility] [address], [place], [place] [address] [phone]

## 2020-02-19 ENCOUNTER — Ambulatory Visit: Payer: Medicare Other | Attending: Internal Medicine

## 2020-02-19 DIAGNOSIS — Z23 Encounter for immunization: Secondary | ICD-10-CM

## 2020-02-19 NOTE — Progress Notes (Signed)
   Covid-19 Vaccination Clinic  Name:  Jared Mayer    MRN: [id number] DOB: 18-Aug-1970  02/19/2020  Mr. Jared Mayer was observed post Covid-19 immunization for 15 minutes without incident. He was provided with Vaccine Information Sheet and instruction to access the V-Safe system.   Mr. Jared Mayer was instructed to call 911 with any severe reactions post vaccine: Marland Kitchen Difficulty breathing  . Swelling of face and throat  . A fast heartbeat  . A bad rash all over body  . Dizziness and weakness

## 2021-03-10 ENCOUNTER — Other Ambulatory Visit: Payer: Self-pay

## 2021-03-10 ENCOUNTER — Emergency Department: Payer: Medicare HMO

## 2021-03-10 DIAGNOSIS — R309 Painful micturition, unspecified: Secondary | ICD-10-CM | POA: Insufficient documentation

## 2021-03-10 DIAGNOSIS — R531 Weakness: Secondary | ICD-10-CM | POA: Insufficient documentation

## 2021-03-10 DIAGNOSIS — Z20822 Contact with and (suspected) exposure to covid-19: Secondary | ICD-10-CM | POA: Insufficient documentation

## 2021-03-10 DIAGNOSIS — W06XXXA Fall from bed, initial encounter: Secondary | ICD-10-CM | POA: Insufficient documentation

## 2021-03-10 DIAGNOSIS — Z79899 Other long term (current) drug therapy: Secondary | ICD-10-CM | POA: Insufficient documentation

## 2021-03-10 DIAGNOSIS — M79662 Pain in left lower leg: Secondary | ICD-10-CM | POA: Insufficient documentation

## 2021-03-10 DIAGNOSIS — R296 Repeated falls: Secondary | ICD-10-CM | POA: Insufficient documentation

## 2021-03-10 DIAGNOSIS — Y92129 Unspecified place in nursing home as the place of occurrence of the external cause: Secondary | ICD-10-CM | POA: Insufficient documentation

## 2021-03-10 DIAGNOSIS — R404 Transient alteration of awareness: Secondary | ICD-10-CM | POA: Diagnosis not present

## 2021-03-10 DIAGNOSIS — R6883 Chills (without fever): Secondary | ICD-10-CM | POA: Insufficient documentation

## 2021-03-10 DIAGNOSIS — E039 Hypothyroidism, unspecified: Secondary | ICD-10-CM | POA: Insufficient documentation

## 2021-03-10 DIAGNOSIS — R300 Dysuria: Secondary | ICD-10-CM | POA: Insufficient documentation

## 2021-03-10 DIAGNOSIS — M79604 Pain in right leg: Secondary | ICD-10-CM

## 2021-03-10 DIAGNOSIS — M79661 Pain in right lower leg: Secondary | ICD-10-CM | POA: Insufficient documentation

## 2021-03-10 DIAGNOSIS — R269 Unspecified abnormalities of gait and mobility: Secondary | ICD-10-CM | POA: Diagnosis not present

## 2021-03-10 DIAGNOSIS — T426X1A Poisoning by other antiepileptic and sedative-hypnotic drugs, accidental (unintentional), initial encounter: Secondary | ICD-10-CM | POA: Diagnosis not present

## 2021-03-10 LAB — COMPREHENSIVE METABOLIC PANEL
ALT: 25 U/L (ref 0–44)
AST: 81 U/L — ABNORMAL HIGH (ref 15–41)
Albumin: 3.7 g/dL (ref 3.5–5.0)
Alkaline Phosphatase: 66 U/L (ref 38–126)
Anion gap: 8 (ref 5–15)
BUN: 17 mg/dL (ref 6–20)
CO2: 27 mmol/L (ref 22–32)
Calcium: 8.8 mg/dL — ABNORMAL LOW (ref 8.9–10.3)
Chloride: 104 mmol/L (ref 98–111)
Creatinine, Ser: 0.88 mg/dL (ref 0.61–1.24)
GFR, Estimated: >60 mL/min (ref >60)
Glucose, Bld: 92 mg/dL (ref 70–99)
Potassium: 3.4 mmol/L — ABNORMAL LOW (ref 3.5–5.1)
Sodium: 139 mmol/L (ref 135–145)
Total Bilirubin: 1.1 mg/dL (ref 0.3–1.2)
Total Protein: 7.2 g/dL (ref 6.5–8.1)

## 2021-03-10 LAB — URINALYSIS, COMPLETE (UACMP) WITH MICROSCOPIC
Bacteria, UA: NONE SEEN
Bilirubin Urine: NEGATIVE
Glucose, UA: NEGATIVE mg/dL
Hgb urine dipstick: NEGATIVE
Ketones, ur: 20 mg/dL — AB
Leukocytes,Ua: NEGATIVE
Nitrite: NEGATIVE
Protein, ur: NEGATIVE mg/dL
Specific Gravity, Urine: 1.024 (ref 1.005–1.030)
Squamous Epithelial / HPF: NONE SEEN (ref 0–5)
pH: 7 (ref 5.0–8.0)

## 2021-03-10 LAB — CBC WITH DIFFERENTIAL/PLATELET
Abs Immature Granulocytes: 0.02 10*3/uL (ref 0.00–0.07)
Basophils Absolute: 0 10*3/uL (ref 0.0–0.1)
Basophils Relative: 0 %
Eosinophils Absolute: 0 10*3/uL (ref 0.0–0.5)
Eosinophils Relative: 0 %
HCT: 49 % (ref 39.0–52.0)
Hemoglobin: 16.8 g/dL (ref 13.0–17.0)
Immature Granulocytes: 0 %
Lymphocytes Relative: 17 %
Lymphs Abs: 1.3 10*3/uL (ref 0.7–4.0)
MCH: 33.9 pg (ref 26.0–34.0)
MCHC: 34.3 g/dL (ref 30.0–36.0)
MCV: 98.8 fL (ref 80.0–100.0)
Monocytes Absolute: 0.6 10*3/uL (ref 0.1–1.0)
Monocytes Relative: 8 %
Neutro Abs: 5.4 10*3/uL (ref 1.7–7.7)
Neutrophils Relative %: 75 %
Platelets: 197 10*3/uL (ref 150–400)
RBC: 4.96 MIL/uL (ref 4.22–5.81)
RDW: 14 % (ref 11.5–15.5)
WBC: 7.3 10*3/uL (ref 4.0–10.5)
nRBC: 0 % (ref 0.0–0.2)

## 2021-03-10 LAB — VALPROIC ACID LEVEL: Valproic Acid Lvl: 82 ug/mL (ref 50.0–100.0)

## 2021-03-10 LAB — TSH: TSH: 2.401 u[IU]/mL (ref 0.350–4.500)

## 2021-03-10 LAB — T4, FREE: Free T4: 1.05 ng/dL (ref 0.61–1.12)

## 2021-03-10 LAB — RESP PANEL BY RT-PCR (FLU A&B, COVID) ARPGX2
Influenza A by PCR: NEGATIVE
Influenza B by PCR: NEGATIVE
SARS Coronavirus 2 by RT PCR: NEGATIVE

## 2021-03-10 LAB — LACTIC ACID, PLASMA: Lactic Acid, Venous: 1.4 mmol/L (ref 0.5–1.9)

## 2021-03-10 MED ORDER — ONDANSETRON 4 MG PO TBDP
4.0000 mg | ORAL_TABLET | Freq: Once | ORAL | Status: AC
  Administered 2021-03-10: 4 mg via ORAL
  Filled 2021-03-10: qty 1

## 2021-03-10 MED ORDER — HYDROCODONE-ACETAMINOPHEN 5-325 MG PO TABS
1.0000 | ORAL_TABLET | Freq: Once | ORAL | Status: AC
  Administered 2021-03-10: 1 via ORAL
  Filled 2021-03-10: qty 1

## 2021-03-10 MED ORDER — LORAZEPAM 2 MG/ML IJ SOLN: 1.0000 mg | Freq: Once | INTRAMUSCULAR | Status: DC

## 2021-03-10 MED ORDER — MELOXICAM 15 MG PO TABS: 15.0000 mg | ORAL_TABLET | Freq: Every day | ORAL | 2 refills | Status: DC

## 2021-03-10 NOTE — ED Triage Notes (Signed)
Pt to ED with mother for fever today, weakness started today. Told staff it hurts to pee  Has not been eating normally  Multiple falls today, hit head. No blood thinner use

## 2021-03-10 NOTE — ED Triage Notes (Signed)
C/O lower extremity weakness, urinary burning, lower abdominal pain.  Low grade fever as well, 100.

## 2021-03-10 NOTE — ED Provider Notes (Signed)
Emergency Medicine Provider Triage Evaluation Note  Jared Mayer , a 50 y.o. male  was evaluated in triage.  Patient has a history of schizophrenia, MR, hypothyroidism, hyperlipidemia and two prior instances of urosepsis. Patient is accompanied by his mom who endorses lower abdominal discomfort, dysuria, fever and weakness.  Patient has not been able to stand and ambulate at home.  Patient did have a fall from his bed at group home and reports that he hit his head.  No complaints of chest pain, chest tightness or shortness of breath.  Review of Systems  Positive: Patient has dysuria, fever and weakness. Negative: No chest pain, chest tightness or SOB.   Physical Exam  BP 109/77   Pulse (!) 106   Temp 98.5 F (36.9 C)   Resp 20   Ht 5\' 8"  (1.727 m)   Wt 84 kg   SpO2 94%   BMI 28.16 kg/m  Gen:   Awake, no distress   Resp:  Normal effort  MSK:   Moves extremities without difficulty  Other:    Medical Decision Making  Medically screening exam initiated at 6:38 PM.  Appropriate orders placed.  Jared Mayer was informed that the remainder of the evaluation will be completed by another provider, this initial triage assessment does not replace that evaluation, and the importance of remaining in the ED until their evaluation is complete.     Jared Hedger [place], PA-C 03/10/21 1842    03/12/21, MD 03/11/21 [phone]

## 2021-03-11 ENCOUNTER — Inpatient Hospital Stay
Admission: EM | Admit: 2021-03-11 | Discharge: 2021-03-13 | DRG: 918 | Disposition: A | Discharge status: Home Health | Payer: Medicare HMO | Attending: Family Medicine | Admitting: Family Medicine

## 2021-03-11 ENCOUNTER — Other Ambulatory Visit: Payer: Self-pay

## 2021-03-11 ENCOUNTER — Encounter: Payer: Self-pay | Admitting: Emergency Medicine

## 2021-03-11 ENCOUNTER — Emergency Department
Admission: EM | Admit: 2021-03-11 | Discharge: 2021-03-11 | Disposition: A | Discharge status: Home / Self Care | Payer: Medicare HMO | Source: Home / Self Care

## 2021-03-11 DIAGNOSIS — W19XXXA Unspecified fall, initial encounter: Secondary | ICD-10-CM | POA: Diagnosis not present

## 2021-03-11 DIAGNOSIS — T426X1A Poisoning by other antiepileptic and sedative-hypnotic drugs, accidental (unintentional), initial encounter: Principal | ICD-10-CM | POA: Diagnosis present

## 2021-03-11 DIAGNOSIS — Z20822 Contact with and (suspected) exposure to covid-19: Secondary | ICD-10-CM | POA: Diagnosis present

## 2021-03-11 DIAGNOSIS — R404 Transient alteration of awareness: Secondary | ICD-10-CM

## 2021-03-11 DIAGNOSIS — T50901A Poisoning by unspecified drugs, medicaments and biological substances, accidental (unintentional), initial encounter: Secondary | ICD-10-CM | POA: Diagnosis present

## 2021-03-11 DIAGNOSIS — E785 Hyperlipidemia, unspecified: Secondary | ICD-10-CM | POA: Diagnosis present

## 2021-03-11 DIAGNOSIS — R519 Headache, unspecified: Secondary | ICD-10-CM | POA: Diagnosis present

## 2021-03-11 DIAGNOSIS — E039 Hypothyroidism, unspecified: Secondary | ICD-10-CM | POA: Diagnosis present

## 2021-03-11 DIAGNOSIS — F70 Mild intellectual disabilities: Secondary | ICD-10-CM | POA: Diagnosis present

## 2021-03-11 DIAGNOSIS — R300 Dysuria: Secondary | ICD-10-CM | POA: Diagnosis present

## 2021-03-11 DIAGNOSIS — Z79890 Hormone replacement therapy (postmenopausal): Secondary | ICD-10-CM

## 2021-03-11 DIAGNOSIS — M6282 Rhabdomyolysis: Secondary | ICD-10-CM | POA: Diagnosis present

## 2021-03-11 DIAGNOSIS — R531 Weakness: Secondary | ICD-10-CM | POA: Diagnosis present

## 2021-03-11 DIAGNOSIS — R2681 Unsteadiness on feet: Secondary | ICD-10-CM | POA: Diagnosis present

## 2021-03-11 DIAGNOSIS — R269 Unspecified abnormalities of gait and mobility: Secondary | ICD-10-CM

## 2021-03-11 DIAGNOSIS — M79604 Pain in right leg: Secondary | ICD-10-CM

## 2021-03-11 DIAGNOSIS — R296 Repeated falls: Secondary | ICD-10-CM | POA: Diagnosis present

## 2021-03-11 DIAGNOSIS — F05 Delirium due to known physiological condition: Secondary | ICD-10-CM | POA: Diagnosis not present

## 2021-03-11 DIAGNOSIS — Y92009 Unspecified place in unspecified non-institutional (private) residence as the place of occurrence of the external cause: Secondary | ICD-10-CM

## 2021-03-11 DIAGNOSIS — Z9181 History of falling: Secondary | ICD-10-CM

## 2021-03-11 DIAGNOSIS — R251 Tremor, unspecified: Secondary | ICD-10-CM | POA: Diagnosis present

## 2021-03-11 DIAGNOSIS — Z79899 Other long term (current) drug therapy: Secondary | ICD-10-CM

## 2021-03-11 DIAGNOSIS — R4182 Altered mental status, unspecified: Secondary | ICD-10-CM | POA: Diagnosis present

## 2021-03-11 DIAGNOSIS — F250 Schizoaffective disorder, bipolar type: Secondary | ICD-10-CM | POA: Diagnosis present

## 2021-03-11 LAB — COMPREHENSIVE METABOLIC PANEL
ALT: 26 U/L (ref 0–44)
AST: 90 U/L — ABNORMAL HIGH (ref 15–41)
Albumin: 3.1 g/dL — ABNORMAL LOW (ref 3.5–5.0)
Alkaline Phosphatase: 48 U/L (ref 38–126)
Anion gap: 7 (ref 5–15)
BUN: 20 mg/dL (ref 6–20)
CO2: 26 mmol/L (ref 22–32)
Calcium: 8.6 mg/dL — ABNORMAL LOW (ref 8.9–10.3)
Chloride: 103 mmol/L (ref 98–111)
Creatinine, Ser: 0.79 mg/dL (ref 0.61–1.24)
GFR, Estimated: >60 mL/min (ref >60)
Glucose, Bld: 93 mg/dL (ref 70–99)
Potassium: 3.1 mmol/L — ABNORMAL LOW (ref 3.5–5.1)
Sodium: 136 mmol/L (ref 135–145)
Total Bilirubin: 0.8 mg/dL (ref 0.3–1.2)
Total Protein: 5.8 g/dL — ABNORMAL LOW (ref 6.5–8.1)

## 2021-03-11 LAB — CBC WITH DIFFERENTIAL/PLATELET
Abs Immature Granulocytes: 0.02 10*3/uL (ref 0.00–0.07)
Basophils Absolute: 0 10*3/uL (ref 0.0–0.1)
Basophils Relative: 0 %
Eosinophils Absolute: 0 10*3/uL (ref 0.0–0.5)
Eosinophils Relative: 0 %
HCT: 41.8 % (ref 39.0–52.0)
Hemoglobin: 15.1 g/dL (ref 13.0–17.0)
Immature Granulocytes: 0 %
Lymphocytes Relative: 19 %
Lymphs Abs: 1.4 10*3/uL (ref 0.7–4.0)
MCH: 34.7 pg — ABNORMAL HIGH (ref 26.0–34.0)
MCHC: 36.1 g/dL — ABNORMAL HIGH (ref 30.0–36.0)
MCV: 96.1 fL (ref 80.0–100.0)
Monocytes Absolute: 0.9 10*3/uL (ref 0.1–1.0)
Monocytes Relative: 13 %
Neutro Abs: 4.8 10*3/uL (ref 1.7–7.7)
Neutrophils Relative %: 68 %
Platelets: 177 10*3/uL (ref 150–400)
RBC: 4.35 MIL/uL (ref 4.22–5.81)
RDW: 13.6 % (ref 11.5–15.5)
WBC: 7.1 10*3/uL (ref 4.0–10.5)
nRBC: 0 % (ref 0.0–0.2)

## 2021-03-11 LAB — RESP PANEL BY RT-PCR (FLU A&B, COVID) ARPGX2
Influenza A by PCR: NEGATIVE
Influenza B by PCR: NEGATIVE
SARS Coronavirus 2 by RT PCR: NEGATIVE

## 2021-03-11 LAB — TSH: TSH: 3.621 u[IU]/mL (ref 0.350–4.500)

## 2021-03-11 LAB — URINE CULTURE: Culture: NO GROWTH

## 2021-03-11 LAB — TROPONIN I (HIGH SENSITIVITY)
Troponin I (High Sensitivity): 11 ng/L (ref <18)
Troponin I (High Sensitivity): 8 ng/L (ref <18)

## 2021-03-11 LAB — CK: Total CK: 4622 U/L — ABNORMAL HIGH (ref 49–397)

## 2021-03-11 MED ORDER — VITAMIN D 25 MCG (1000 UNIT) PO TABS
1000.0000 [IU] | ORAL_TABLET | Freq: Every day | ORAL | Status: DC
Start: 2021-03-12 — End: 2021-03-14
  Administered 2021-03-12 – 2021-03-13 (×2): 1000 [IU] via ORAL
  Filled 2021-03-11 (×2): qty 1

## 2021-03-11 MED ORDER — SODIUM CHLORIDE 0.9 % IV SOLN: INTRAVENOUS | Status: DC

## 2021-03-11 MED ORDER — OMEGA-3-ACID ETHYL ESTERS 1 G PO CAPS
1000.0000 mg | ORAL_CAPSULE | Freq: Two times a day (BID) | ORAL | Status: DC
  Administered 2021-03-11 – 2021-03-13 (×4): 1000 mg via ORAL
  Filled 2021-03-11 (×5): qty 1

## 2021-03-11 MED ORDER — SODIUM CHLORIDE 0.9 % IV SOLN: Freq: Once | INTRAVENOUS | Status: DC

## 2021-03-11 MED ORDER — SODIUM CHLORIDE 0.9 % IV BOLUS: 1000.0000 mL | Freq: Once | INTRAVENOUS | Status: DC

## 2021-03-11 MED ORDER — DIVALPROEX SODIUM ER 250 MG PO TB24: 500.0000 mg | ORAL_TABLET | ORAL | Status: DC

## 2021-03-11 MED ORDER — CHLORHEXIDINE GLUCONATE 0.12 % MT SOLN
7.5000 mL | Freq: Two times a day (BID) | OROMUCOSAL | Status: DC
  Administered 2021-03-11 – 2021-03-13 (×4): 7.5 mL via OROMUCOSAL
  Filled 2021-03-11 (×4): qty 15

## 2021-03-11 MED ORDER — SODIUM FLUORIDE 1.1 % DT CREA: 1.0000 "application " | TOPICAL_CREAM | Freq: Two times a day (BID) | DENTAL | Status: DC

## 2021-03-11 MED ORDER — LINACLOTIDE 290 MCG PO CAPS
290.0000 ug | ORAL_CAPSULE | Freq: Every day | ORAL | Status: DC
Start: 2021-03-12 — End: 2021-03-14
  Administered 2021-03-12: 290 ug via ORAL
  Filled 2021-03-11 (×3): qty 1

## 2021-03-11 MED ORDER — PANTOPRAZOLE SODIUM 40 MG PO TBEC
40.0000 mg | DELAYED_RELEASE_TABLET | Freq: Every day | ORAL | Status: DC
  Administered 2021-03-11 – 2021-03-13 (×3): 40 mg via ORAL
  Filled 2021-03-11 (×3): qty 1

## 2021-03-11 MED ORDER — GLYCOPYRROLATE 1 MG PO TABS
1.0000 mg | ORAL_TABLET | Freq: Two times a day (BID) | ORAL | Status: DC
  Administered 2021-03-11 – 2021-03-13 (×4): 1 mg via ORAL
  Filled 2021-03-11 (×5): qty 1

## 2021-03-11 MED ORDER — SUCRALFATE 1 G PO TABS
1.0000 g | ORAL_TABLET | Freq: Two times a day (BID) | ORAL | Status: DC
  Administered 2021-03-11 – 2021-03-13 (×4): 1 g via ORAL
  Filled 2021-03-11 (×4): qty 1

## 2021-03-11 MED ORDER — ATORVASTATIN CALCIUM 20 MG PO TABS
40.0000 mg | ORAL_TABLET | Freq: Every day | ORAL | Status: DC
  Administered 2021-03-11 – 2021-03-12 (×2): 40 mg via ORAL
  Filled 2021-03-11 (×2): qty 2

## 2021-03-11 MED ORDER — POLYETHYLENE GLYCOL 3350 17 G PO PACK
34.0000 g | PACK | Freq: Every day | ORAL | Status: DC
  Administered 2021-03-12 – 2021-03-13 (×2): 34 g via ORAL
  Filled 2021-03-11 (×2): qty 2

## 2021-03-11 MED ORDER — HYDROXYZINE HCL 25 MG PO TABS
25.0000 mg | ORAL_TABLET | Freq: Two times a day (BID) | ORAL | Status: DC
  Administered 2021-03-11 – 2021-03-13 (×4): 25 mg via ORAL
  Filled 2021-03-11 (×4): qty 1

## 2021-03-11 MED ORDER — ENOXAPARIN SODIUM 40 MG/0.4ML IJ SOSY
40.0000 mg | PREFILLED_SYRINGE | INTRAMUSCULAR | Status: DC
  Administered 2021-03-11 – 2021-03-12 (×2): 40 mg via SUBCUTANEOUS
  Filled 2021-03-11 (×2): qty 0.4

## 2021-03-11 MED ORDER — DOCUSATE SODIUM 100 MG PO CAPS
100.0000 mg | ORAL_CAPSULE | Freq: Two times a day (BID) | ORAL | Status: DC
  Administered 2021-03-11 – 2021-03-13 (×4): 100 mg via ORAL
  Filled 2021-03-11 (×4): qty 1

## 2021-03-11 MED ORDER — LEVOTHYROXINE SODIUM 112 MCG PO TABS
112.0000 ug | ORAL_TABLET | Freq: Every day | ORAL | Status: DC
  Administered 2021-03-12 – 2021-03-13 (×2): 112 ug via ORAL
  Filled 2021-03-11 (×4): qty 1

## 2021-03-11 MED ORDER — LORAZEPAM 0.5 MG PO TABS
0.5000 mg | ORAL_TABLET | Freq: Two times a day (BID) | ORAL | Status: DC
  Administered 2021-03-11 – 2021-03-12 (×2): 0.5 mg via ORAL
  Filled 2021-03-11 (×2): qty 1

## 2021-03-11 MED ORDER — SIMETHICONE 80 MG PO CHEW
80.0000 mg | CHEWABLE_TABLET | Freq: Four times a day (QID) | ORAL | Status: DC | PRN
  Filled 2021-03-11: qty 1

## 2021-03-11 MED ORDER — TOPIRAMATE 25 MG PO TABS
75.0000 mg | ORAL_TABLET | Freq: Two times a day (BID) | ORAL | Status: DC
  Administered 2021-03-11 – 2021-03-13 (×4): 75 mg via ORAL
  Filled 2021-03-11 (×5): qty 3

## 2021-03-11 MED ORDER — LACTATED RINGERS IV BOLUS
1000.0000 mL | Freq: Once | INTRAVENOUS | Status: AC
  Administered 2021-03-11: 1000 mL via INTRAVENOUS

## 2021-03-11 MED ORDER — OLANZAPINE 10 MG PO TABS
30.0000 mg | ORAL_TABLET | Freq: Every day | ORAL | Status: DC
  Administered 2021-03-11 – 2021-03-12 (×2): 30 mg via ORAL
  Filled 2021-03-11 (×3): qty 3

## 2021-03-11 MED ORDER — BUPROPION HCL ER (XL) 150 MG PO TB24
300.0000 mg | ORAL_TABLET | Freq: Every day | ORAL | Status: DC
  Administered 2021-03-12 – 2021-03-13 (×2): 300 mg via ORAL
  Filled 2021-03-11 (×2): qty 2

## 2021-03-11 MED ORDER — LORATADINE 10 MG PO TABS
10.0000 mg | ORAL_TABLET | Freq: Every day | ORAL | Status: DC
  Administered 2021-03-11 – 2021-03-13 (×3): 10 mg via ORAL
  Filled 2021-03-11 (×3): qty 1

## 2021-03-11 MED ORDER — VALBENAZINE TOSYLATE 40 MG PO CAPS
80.0000 mg | ORAL_CAPSULE | Freq: Every day | ORAL | Status: DC
  Administered 2021-03-11 – 2021-03-12 (×2): 80 mg via ORAL
  Filled 2021-03-11 (×3): qty 2

## 2021-03-11 NOTE — ED Provider Notes (Signed)
[facility]-EMERGENCY DEPARTMENT  ____________________________________________  Time seen: Approximately 3:07 PM  I have reviewed the triage vital signs and the nursing notes.   HISTORY  Chief Complaint Weakness and Fever   Historian Mother and Father     HPI Jared Mayer is a 50 y.o. male with a history of schizophrenia, manic behavior and hyperlipidemia, presents to the emergency department with weakness and falls.  According to mom, group home representative reports that patient has been complaining of burning with urination.  Patient does have a history of urosepsis in the past.  Mom reports that patient has been repeating things more so than normal and they have had difficulty getting patient to stand and ambulate.  Group home representative does state that patient had a fall from his bed last night.  He has had some low-grade fever and chills according to group home representative but no fever today.  He has not reported chest pain, chest tightness or shortness of breath.  No vomiting. No new medication changes according to parent's knowledge.    Past Medical History:  Diagnosis Date   Dyslipidemia    Hypothyroid    Manic behavior (HCC)    Mentally disabled    Schizoaffective disorder, bipolar type (HCC)      Immunizations up to date:  Yes.     Past Medical History:  Diagnosis Date   Dyslipidemia    Hypothyroid    Manic behavior (HCC)    Mentally disabled    Schizoaffective disorder, bipolar type [facility])     Patient Active Problem List   Diagnosis Date Noted   UTI (urinary tract infection) 03/29/2018    No past surgical history on file.  Prior to Admission medications   Medication Sig Start Date End Date Taking? Authorizing Provider  atorvastatin (LIPITOR) 40 MG tablet Take 40 mg by mouth daily.    [provider]  buPROPion (WELLBUTRIN XL) 150 MG 24 hr tablet Take 300 mg by mouth daily.    [provider]  chlorhexidine (PERIDEX) 0.12 %  solution 7.5 mLs by Mouth Rinse route 2 (two) times daily. After brushing teeth. Do not swallow or rinse with water or mouthwash after. 01/30/21   [provider]  cholecalciferol (VITAMIN D3) 25 MCG (1000 UT) tablet Take 1,000 Units by mouth daily.    [provider]  divalproex (DEPAKOTE ER) 500 MG 24 hr tablet Take 500-2,000 mg by mouth See admin instructions. 500 mg every morning and 2000 mg at bedtime    [provider]  docusate sodium (COLACE) 100 MG capsule Take 100 mg by mouth 2 (two) times daily.    [provider]  fexofenadine (ALLEGRA) 180 MG tablet Take 180 mg by mouth daily as needed for allergies.     [provider]  fluticasone (FLONASE) 50 MCG/ACT nasal spray Place 2 sprays into the nose daily as needed for allergies or rhinitis.     [provider]  glycopyrrolate (ROBINUL) 1 MG tablet Take 1 mg by mouth 2 (two) times daily. 03/05/21   [provider]  hydrOXYzine (ATARAX/VISTARIL) 25 MG tablet Take 25 mg by mouth 2 (two) times daily. 03/10/21   [provider]  INGREZZA 80 MG capsule Take 80 mg by mouth at bedtime. 02/17/21   [provider]  lamoTRIgine (LAMICTAL) 100 MG tablet Take 100 mg by mouth daily.    [provider]  levothyroxine (SYNTHROID) 112 MCG tablet Take 112 mcg by mouth daily. On an empty stomach with a  glass of water 30-60 minutes before breakfast    [provider]  linaclotide (LINZESS) 290 MCG CAPS capsule Take 290 mcg by mouth daily.    [provider]  LORazepam (ATIVAN) 0.5 MG tablet Take 0.5-1 mg by mouth 2 (two) times daily. Take 1 tablet (0.5 mg) each morning and 2 tablets (1 mg) at bedtime 03/10/21   [provider]  meloxicam (MOBIC) 15 MG tablet Take 1 tablet (15 mg total) by mouth daily for 7 days. Patient not taking: Reported on 03/11/2021 03/10/21 03/17/21  Orvil Feil, PA-C  OLANZapine (ZYPREXA) 15 MG tablet Take 30 mg by mouth at  bedtime.    [provider]  Omega-3 Fatty Acids (FISH OIL) 1000 MG CAPS Take 1,000 mg by mouth 2 (two) times daily.    [provider]  omeprazole (PRILOSEC) 20 MG capsule Take 40 mg by mouth daily.    [provider]  polyethylene glycol powder (GLYCOLAX/MIRALAX) 17 GM/SCOOP powder Take 34 g by mouth daily. Mix with 8 ounces of water 11/19/20   [provider]  simethicone (MYLICON) 80 MG chewable tablet Chew 80 mg by mouth every 6 (six) hours as needed for flatulence.    [provider]  sodium fluoride (PREVIDENT 5000 PLUS) 1.1 % CREA dental cream Place 1 application onto teeth 2 (two) times daily.    [provider]  sucralfate (CARAFATE) 1 g tablet Take 1 g by mouth 2 (two) times daily. Before meals    [provider]  SYSTANE COMPLETE 0.6 % SOLN Place 1 drop into both eyes 3 (three) times daily as needed for dry eyes. 01/21/21   [provider]  topiramate (TOPAMAX) 25 MG tablet Take 75 mg by mouth 2 (two) times daily.    [provider]  triamcinolone cream (KENALOG) 0.1 % Apply 1 application topically 2 (two) times daily as needed (for inflammation/irritation).    [provider]    Allergies Patient has no known allergies.  Family History  Problem Relation Age of Onset   Hypothyroidism Mother    Sarcoidosis Mother     Social History Social History   Tobacco Use   Smoking status: Never   Smokeless tobacco: Never  Vaping Use   Vaping Use: Never used  Substance Use Topics   Alcohol use: Never   Drug use: Never     Review of Systems  Constitutional: Patient had chills.  Eyes:  No discharge ENT: No upper respiratory complaints. Respiratory: no cough. No SOB/ use of accessory muscles to breath Gastrointestinal:   No nausea, no vomiting.  No diarrhea.  No constipation. Genitourinary: Patient had dysuria.  Musculoskeletal: Negative for musculoskeletal pain. Skin: Negative for rash,  abrasions, lacerations, ecchymosis.    ____________________________________________   PHYSICAL EXAM:  VITAL SIGNS: ED Triage Vitals  Enc Vitals Group     BP 03/10/21 1826 109/77     Pulse Rate 03/10/21 1826 (!) 106     Resp 03/10/21 1826 20     Temp 03/10/21 1826 98.5 F (36.9 C)     Temp src --      SpO2 03/10/21 1826 94 %     Weight 03/10/21 1827 185 lb 3 oz (84 kg)     Height 03/10/21 1827  (1.727 m)     Head Circumference --      Peak Flow --      Pain Score 03/11/21 0200 0     Pain Loc --  Pain Edu? --      Excl. in GC? --      Constitutional: Alert and oriented. Well appearing and in no acute distress. Eyes: Conjunctivae are normal. PERRL. EOMI. Head: Atraumatic. ENT:      Nose: No congestion/rhinnorhea.      Mouth/Throat: Mucous membranes are moist.  Neck: No stridor.  No cervical spine tenderness to palpation. Cardiovascular: Normal rate, regular rhythm. Normal S1 and S2.  Good peripheral circulation. Respiratory: Normal respiratory effort without tachypnea or retractions. Lungs CTAB. Good air entry to the bases with no decreased or absent breath sounds Gastrointestinal: Bowel sounds x 4 quadrants. Soft and nontender to palpation. No guarding or rigidity. No distention. Musculoskeletal: Full range of motion to all extremities. No obvious deformities noted. Patient will ambulate with assistance from family and walker.  Neurologic: Patient has repetitive speech.  Skin:  Skin is warm, dry and intact. No rash noted. Psychiatric: Mood and affect are normal for age. Speech and behavior are normal.   ____________________________________________   LABS (all labs ordered are listed, but only abnormal results are displayed)  Labs Reviewed  COMPREHENSIVE METABOLIC PANEL - Abnormal; Notable for the following components:      Result Value   Potassium 3.4 (*)    Calcium 8.8 (*)    AST 81 (*)    All other components within normal limits  URINALYSIS, COMPLETE  (UACMP) WITH MICROSCOPIC - Abnormal; Notable for the following components:   Color, Urine YELLOW (*)    APPearance CLOUDY (*)    Ketones, ur 20 (*)    All other components within normal limits  CULTURE, BLOOD (ROUTINE X 2)  CULTURE, BLOOD (ROUTINE X 2)  RESP PANEL BY RT-PCR (FLU A&B, COVID) ARPGX2  URINE CULTURE  CBC WITH DIFFERENTIAL/PLATELET  LACTIC ACID, PLASMA  VALPROIC ACID LEVEL  TSH  T4, FREE  T3, FREE   ____________________________________________  EKG   ____________________________________________  RADIOLOGY Geraldo Pitter, personally viewed and evaluated these images (plain radiographs) as part of my medical decision making, as well as reviewing the written report by the radiologist.  DG Chest 2 View  Result Date: 03/10/2021 CLINICAL DATA:  Altered mental status. EXAM: CHEST - 2 VIEW COMPARISON:  Chest radiograph dated 03/29/2018 FINDINGS: There is mild eventration of the right hemidiaphragm. Left lung base atelectasis. No focal consolidation, pleural effusion, or pneumothorax. The cardiac silhouette is within normal limits. No acute osseous pathology. IMPRESSION: No active cardiopulmonary disease. Electronically Signed   By: Elgie Collard M.D.   On: 03/10/2021 19:19   CT Head Wo Contrast  Result Date: 03/10/2021 CLINICAL DATA:  Concern for intracranial hemorrhage. EXAM: CT HEAD WITHOUT CONTRAST TECHNIQUE: Contiguous axial images were obtained from the base of the skull through the vertex without intravenous contrast. COMPARISON:  Head CT report dated 12/25/1999. FINDINGS: Brain: Mild asymmetric atrophy of the left cerebral cortex. The ventricles are appropriate size for patient's age. The gray-white matter discrimination is preserved. There is no acute intracranial hemorrhage. No mass effect or midline shift. No extra-axial fluid collection. Vascular: No hyperdense vessel or unexpected calcification. Skull: Normal. Negative for fracture or focal lesion.  Sinuses/Orbits: No acute finding. Other: None IMPRESSION: 1. No acute intracranial pathology. 2. Mild asymmetric atrophy of the left cerebral cortex. Electronically Signed   By: Elgie Collard M.D.   On: 03/10/2021 19:27   MR BRAIN WO CONTRAST  Result Date: 03/10/2021 CLINICAL DATA:  Acute neurologic deficit EXAM: MRI HEAD WITHOUT CONTRAST TECHNIQUE: Multiplanar, multiecho pulse sequences  of the brain and surrounding structures were obtained without intravenous contrast. COMPARISON:  None. FINDINGS: Brain: No acute infarct, mass effect or extra-axial collection. No acute or chronic hemorrhage. Normal white matter signal, parenchymal volume and CSF spaces. The midline structures are normal. Vascular: Major flow voids are preserved. Skull and upper cervical spine: Normal calvarium and skull base. Visualized upper cervical spine and soft tissues are normal. Sinuses/Orbits:No paranasal sinus fluid levels or advanced mucosal thickening. No mastoid or middle ear effusion. Normal orbits. IMPRESSION: Normal brain MRI. Electronically Signed   By: Deatra Robinson M.D.   On: 03/10/2021 22:50   MR LUMBAR SPINE WO CONTRAST  Result Date: 03/10/2021 CLINICAL DATA:  Inability to stand or wall EXAM: MRI LUMBAR SPINE WITHOUT CONTRAST TECHNIQUE: Multiplanar, multisequence MR imaging of the lumbar spine was performed. No intravenous contrast was administered. COMPARISON:  None. FINDINGS: Segmentation:  Standard Alignment:  Normal Vertebrae:  No fracture, evidence of discitis, or bone lesion. Conus medullaris and cauda equina: Conus extends to the L1 level. Conus and cauda equina appear normal. Paraspinal and other soft tissues: Negative. Disc levels: L1-L2: Normal disc space and facet joints. No spinal canal stenosis. No neural foraminal stenosis. L2-L3: Small central annular fissure without herniation. No spinal canal stenosis. No neural foraminal stenosis. L3-L4: Normal disc space and facet joints. No spinal canal stenosis.  No neural foraminal stenosis. L4-L5: Small disc bulge. No spinal canal stenosis. No neural foraminal stenosis. L5-S1: Normal disc space and facet joints. No spinal canal stenosis. No neural foraminal stenosis. Visualized sacrum: Normal. IMPRESSION: 1. No acute abnormality of the lumbar spine. 2. Mild lumbar degenerative disc disease without spinal canal or neural foraminal stenosis. Electronically Signed   By: Deatra Robinson M.D.   On: 03/10/2021 22:52    ____________________________________________    PROCEDURES  Procedure(s) performed:     Procedures     Medications  HYDROcodone-acetaminophen (NORCO/VICODIN) 5-325 MG per tablet 1 tablet (1 tablet Oral Given 03/10/21 2319)  ondansetron (ZOFRAN-ODT) disintegrating tablet 4 mg (4 mg Oral Given 03/10/21 2319)     ____________________________________________   INITIAL IMPRESSION / ASSESSMENT AND PLAN / ED COURSE  Pertinent labs & imaging results that were available during my care of the patient were reviewed by me and considered in my medical decision making (see chart for details).  Clinical Course as of 03/11/21 1507  Mon Mar 10, 2021  2301 Valproic acid level [JW]    Clinical Course User Index [JW] Orvil Feil, PA-C     Assessment and plan Falls 50 year old male presents to the emergency department with reports of dysuria, chills and increased falls at home.  Patient was mildly tachycardic at triage but vital signs were otherwise reassuring.  On exam, patient was alert.  Mom reports that patient is repeating things more than normal.  His strength appears symmetric in the upper and lower extremities and sensation is intact.  He is able to ambulate with assistance from family and walker.   CBC, CMP within reference range.  Urinalysis shows no signs of UTI.  Urine culture in process.  Blood cultures in process.  Lactic within reference range.  TSH and T4 within reference range.  There were no consolidations, opacities or  infiltrates on chest x-ray.  No evidence of intracranial bleed or skull fracture on CT head.  I did have a conversation with my attending Dr. Larinda Buttery regarding patient's case and he recommended obtaining a valproic acid level which was within reference range and we decided to proceed  with MRIs of the brain and lumbar spine which were unremarkable.  I had an extensive conversation with patient's mother who feels reassured by work-up at this time and would like to take patient back to the group home tonight.  Caretaker was cautioned to return to the emergency department if symptoms seem to be worsening at home.  She has easy access to the emergency department should symptoms change or worsen.     ____________________________________________  FINAL CLINICAL IMPRESSION(S) / ED DIAGNOSES  Final diagnoses:  Pain in both lower extremities      NEW MEDICATIONS STARTED DURING THIS VISIT:  ED Discharge Orders          Ordered    meloxicam (MOBIC) 15 MG tablet  Daily,   Status:  Discontinued        03/10/21 2324    meloxicam (MOBIC) 15 MG tablet  Daily        03/10/21 2329                This chart was dictated using voice recognition software/Dragon. Despite best efforts to proofread, errors can occur which can change the meaning. Any change was purely unintentional.     Orvil Feil, PA-C 03/11/21 1537    Shaune Pollack, MD 03/11/21 1818

## 2021-03-11 NOTE — H&P (Signed)
History and Physical    Jared Mayer AJG:[id number] DOB: 1970/07/27 DOA: 03/11/2021  PCP: Pcp, No   Chief Complaint: Altered mental status and falls*4  HPI: Jared Mayer is a 50 y.o. male with medical history significant of Schizoaffective disorder, h/o intellectual disability was found to have unsteady gait leading to possibly 4 falls per mother. Patient is a resident of local group home. He was brought into ED on 10/31 but D/Ced back. He had more falls and AMS so mother was notified who inquired with group home personal and was found that there was a recent pharmacy change with group home and patient's room mate Lamotrigine might have been exchanged with him. Instead of him receiving 100 mg BID, he might have received 300 mg in am 400 mg QHS over last 3 days so it's assumed that he likely received 700 mg more of Lamotrigine dose. Mother feels his mentation is somewhat improved than before but not quite back to baseline.   Review of Systems: ROS  As per HPI otherwise 10 point review of systems negative.   No Known Allergies  Past Medical History:  Diagnosis Date   Dyslipidemia    Hypothyroid    Manic behavior (HCC)    Mentally disabled    Schizoaffective disorder, bipolar type (HCC)     History reviewed. No pertinent surgical history.   reports that he has never smoked. He has never used smokeless tobacco. He reports that he does not drink alcohol and does not use drugs.  Family History  Problem Relation Age of Onset   Hypothyroidism Mother    Sarcoidosis Mother     Prior to Admission medications   Medication Sig Start Date End Date Taking? Authorizing Provider  atorvastatin (LIPITOR) 40 MG tablet Take 40 mg by mouth daily.   Yes [provider]  buPROPion (WELLBUTRIN XL) 150 MG 24 hr tablet Take 300 mg by mouth daily.   Yes [provider]  chlorhexidine (PERIDEX) 0.12 % solution 7.5 mLs by Mouth Rinse route 2 (two) times daily. After brushing teeth. Do not  swallow or rinse with water or mouthwash after. 01/30/21  Yes [provider]  cholecalciferol (VITAMIN D3) 25 MCG (1000 UT) tablet Take 1,000 Units by mouth daily.   Yes [provider]  divalproex (DEPAKOTE ER) 500 MG 24 hr tablet Take 500-2,000 mg by mouth See admin instructions. 500 mg every morning and 2000 mg at bedtime   Yes [provider]  docusate sodium (COLACE) 100 MG capsule Take 100 mg by mouth 2 (two) times daily.   Yes [provider]  fexofenadine (ALLEGRA) 180 MG tablet Take 180 mg by mouth daily as needed for allergies.    Yes [provider]  fluticasone (FLONASE) 50 MCG/ACT nasal spray Place 2 sprays into the nose daily as needed for allergies or rhinitis.    Yes [provider]  glycopyrrolate (ROBINUL) 1 MG tablet Take 1 mg by mouth 2 (two) times daily. 03/05/21  Yes [provider]  hydrOXYzine (ATARAX/VISTARIL) 25 MG tablet Take 25 mg by mouth 2 (two) times daily. 03/10/21  Yes [provider]  INGREZZA 80 MG capsule Take 80 mg by mouth at bedtime. 02/17/21  Yes [provider]  lamoTRIgine (LAMICTAL) 100 MG tablet Take 100 mg by mouth daily.   Yes [provider]  levothyroxine (SYNTHROID) 112 MCG tablet Take 112 mcg by mouth daily. On an empty stomach with a glass of water 30-60 minutes before breakfast  Yes [provider]  linaclotide (LINZESS) 290 MCG CAPS capsule Take 290 mcg by mouth daily.   Yes [provider]  LORazepam (ATIVAN) 0.5 MG tablet Take 0.5-1 mg by mouth 2 (two) times daily. Take 1 tablet (0.5 mg) each morning and 2 tablets (1 mg) at bedtime 03/10/21  Yes [provider]  OLANZapine (ZYPREXA) 15 MG tablet Take 30 mg by mouth at bedtime.   Yes [provider]  Omega-3 Fatty Acids (FISH OIL) 1000 MG CAPS Take 1,000 mg by mouth 2 (two) times daily.   Yes [provider]  omeprazole (PRILOSEC) 20 MG capsule Take 40 mg by mouth  daily.   Yes [provider]  polyethylene glycol powder (GLYCOLAX/MIRALAX) 17 GM/SCOOP powder Take 34 g by mouth daily. Mix with 8 ounces of water 11/19/20  Yes [provider]  simethicone (MYLICON) 80 MG chewable tablet Chew 80 mg by mouth every 6 (six) hours as needed for flatulence.   Yes [provider]  sodium fluoride (PREVIDENT 5000 PLUS) 1.1 % CREA dental cream Place 1 application onto teeth 2 (two) times daily.   Yes [provider]  sucralfate (CARAFATE) 1 g tablet Take 1 g by mouth 2 (two) times daily. Before meals   Yes [provider]  SYSTANE COMPLETE 0.6 % SOLN Place 1 drop into both eyes 3 (three) times daily as needed for dry eyes. 01/21/21  Yes [provider]  topiramate (TOPAMAX) 25 MG tablet Take 75 mg by mouth 2 (two) times daily.   Yes [provider]  triamcinolone cream (KENALOG) 0.1 % Apply 1 application topically 2 (two) times daily as needed (for inflammation/irritation).   Yes [provider]    Physical Exam: Vitals:   03/11/21 1259 03/11/21 1305 03/11/21 1413  BP: (!) 124/94  125/77  Pulse: 64  93  Resp: 20  20  Temp:  98.3 F (36.8 C)   TempSrc:  Oral   SpO2: 96%  95%   Physical Exam   Constitutional:      General: He is not in acute distress.    Appearance: He is well-developed.  HENT:     Head: Normocephalic and atraumatic.  Eyes:     Conjunctiva/sclera: Conjunctivae normal.  Cardiovascular:     Rate and Rhythm: Normal rate and regular rhythm.     Heart sounds: Normal heart sounds. No murmur heard. Pulmonary:     Effort: Pulmonary effort is normal. No respiratory distress.     Breath sounds: Normal breath sounds. No wheezing or rales.  Abdominal:     no tenderness, soft Musculoskeletal:     Cervical back: Neck supple.  Skin:     Bruises and ecchymosis on all extremities  Neurological:     Alert, Awake. Non-focal Psych    Echolalia, Tangential speech. Blunt affect,  Impaired cognition   Labs on Admission: I have personally reviewed the patients's labs and imaging studies.  Assessment/Plan Principal Problem:   Accidental overdose of lamotrigine Active Problems:   Altered mental status   Schizoaffective disorder, bipolar type (HCC)   Mild intellectual disability   Fall at home, initial encounter   43 y m with K/H/O Schizoaffective disorder who is a resident of group home is being admitted for suspected Lamotrigine overdose  * Accidental overdose of lamotrigine Received 700 mg over last 3 days. Normally gets 100 mg bid but instead might have received 300 mg in am & 400 mg in pm for last 3 days per  mother. Level has been sent out by EDP but may take few days to result as it's send out lab. Will hydrate him with IVFs and admit under observation  Fall at home, initial encounter Thought to be due to Lamictal overdose. Mother reports 4 falls in last 1-2 days PT, OT c/s  Mild intellectual disability At baseline  Schizoaffective disorder, bipolar type (HCC) Psych c/s and appreciate Dr clapacs input  Altered mental status Thought to be due to Lamictal overdose. Although his mentation is slowly improving      Code Status: Full Code   DVT Prophylaxis:  enoxaparin (LOVENOX) injection 40 mg Start: 03/11/21 1600 SCDs Start: 03/11/21 1547 Place TED hose Start: 03/11/21 1547   Family Communication: updated Mother Misty Stanley) at bedside Admission status: Observation Med-Surg  Certification: The appropriate patient status for this patient is OBSERVATION. Observation status is judged to be reasonable and necessary in order to provide the required intensity of service to ensure the patient's safety. The patient's presenting symptoms, physical exam findings, and initial radiographic and laboratory data in the context of their medical condition is felt to place them at decreased risk for further clinical deterioration. Furthermore, it is anticipated that the  patient will be medically stable for discharge from the hospital within 2 midnights of admission.     Delfino Lovett MD [facility] If 7PM-7AM, please contact night-coverage [email or web address]  03/11/2021, 5:41 PM

## 2021-03-11 NOTE — ED Provider Notes (Signed)
[facility] Emergency Department Provider Note  ____________________________________________   Event Date/Time   First MD Initiated Contact with Patient 03/11/21 1303     (approximate)  I have reviewed the triage vital signs and the nursing notes.   HISTORY  Chief Complaint Weakness    HPI Jared Mayer is a 50 y.o. male with history of schizoaffective disorder, hyperlipidemia, here with generalized weakness.  The patient was here yesterday because over the last 2 days, he has increasing weakness, difficulty walking, tremors.  He has had multiple falls.  He has been more confused.  He has also had questionable low-grade fever and chills, as well as body aches in his bilateral legs.  He was seen yesterday and had an extensive work-up which was unremarkable.  I reviewed the notes from that visit which showed negative MRI and negative lab work.  He returned home and has since remained weak and fallen twice.  He is normal able to ambulate independently and required 3 people to walk today.  His mother, who is an Charity fundraiser here, just found out today that the patient had accidentally received 700 mg of Lamictal for the last several days instead of his usual 200 mg.  This correlates directly with his symptoms.  He has never had similar issues with Mictral in the past.  No rash.  No fevers or chills today.  He currently complains of mild headache after he fell and hit his head yesterday, as well as headache today although there was no loss of consciousness or major trauma today.  No focal numbness or weakness.  Denies any abdominal pain.    Past Medical History:  Diagnosis Date   Dyslipidemia    Hypothyroid    Manic behavior (HCC)    Mentally disabled    Schizoaffective disorder, bipolar type [facility])     Patient Active Problem List   Diagnosis Date Noted   Drug overdose 03/11/2021   UTI (urinary tract infection) 03/29/2018    History reviewed. No pertinent surgical  history.  Prior to Admission medications   Medication Sig Start Date End Date Taking? Authorizing Provider  atorvastatin (LIPITOR) 40 MG tablet Take 40 mg by mouth daily.   Yes [provider]  buPROPion (WELLBUTRIN XL) 150 MG 24 hr tablet Take 300 mg by mouth daily.   Yes [provider]  chlorhexidine (PERIDEX) 0.12 % solution 7.5 mLs by Mouth Rinse route 2 (two) times daily. After brushing teeth. Do not swallow or rinse with water or mouthwash after. 01/30/21  Yes [provider]  cholecalciferol (VITAMIN D3) 25 MCG (1000 UT) tablet Take 1,000 Units by mouth daily.   Yes [provider]  divalproex (DEPAKOTE ER) 500 MG 24 hr tablet Take 500-2,000 mg by mouth See admin instructions. 500 mg every morning and 2000 mg at bedtime   Yes [provider]  docusate sodium (COLACE) 100 MG capsule Take 100 mg by mouth 2 (two) times daily.   Yes [provider]  fexofenadine (ALLEGRA) 180 MG tablet Take 180 mg by mouth daily as needed for allergies.    Yes [provider]  fluticasone (FLONASE) 50 MCG/ACT nasal spray Place 2 sprays into the nose daily as needed for allergies or rhinitis.    Yes [provider]  glycopyrrolate (ROBINUL) 1 MG tablet Take 1 mg by mouth 2 (two) times daily. 03/05/21  Yes [provider]  hydrOXYzine (ATARAX/VISTARIL) 25 MG tablet Take 25 mg by mouth 2 (two) times daily.  03/10/21  Yes [provider]  INGREZZA 80 MG capsule Take 80 mg by mouth at bedtime. 02/17/21  Yes [provider]  lamoTRIgine (LAMICTAL) 100 MG tablet Take 100 mg by mouth daily.   Yes [provider]  levothyroxine (SYNTHROID) 112 MCG tablet Take 112 mcg by mouth daily. On an empty stomach with a glass of water 30-60 minutes before breakfast   Yes [provider]  linaclotide (LINZESS) 290 MCG CAPS capsule Take 290 mcg by mouth daily.   Yes [provider]  LORazepam (ATIVAN) 0.5 MG  tablet Take 0.5-1 mg by mouth 2 (two) times daily. Take 1 tablet (0.5 mg) each morning and 2 tablets (1 mg) at bedtime 03/10/21  Yes [provider]  OLANZapine (ZYPREXA) 15 MG tablet Take 30 mg by mouth at bedtime.   Yes [provider]  Omega-3 Fatty Acids (FISH OIL) 1000 MG CAPS Take 1,000 mg by mouth 2 (two) times daily.   Yes [provider]  omeprazole (PRILOSEC) 20 MG capsule Take 40 mg by mouth daily.   Yes [provider]  polyethylene glycol powder (GLYCOLAX/MIRALAX) 17 GM/SCOOP powder Take 34 g by mouth daily. Mix with 8 ounces of water 11/19/20  Yes [provider]  simethicone (MYLICON) 80 MG chewable tablet Chew 80 mg by mouth every 6 (six) hours as needed for flatulence.   Yes [provider]  sodium fluoride (PREVIDENT 5000 PLUS) 1.1 % CREA dental cream Place 1 application onto teeth 2 (two) times daily.   Yes [provider]  sucralfate (CARAFATE) 1 g tablet Take 1 g by mouth 2 (two) times daily. Before meals   Yes [provider]  SYSTANE COMPLETE 0.6 % SOLN Place 1 drop into both eyes 3 (three) times daily as needed for dry eyes. 01/21/21  Yes [provider]  topiramate (TOPAMAX) 25 MG tablet Take 75 mg by mouth 2 (two) times daily.   Yes [provider]  triamcinolone cream (KENALOG) 0.1 % Apply 1 application topically 2 (two) times daily as needed (for inflammation/irritation).   Yes [provider]    Allergies Patient has no known allergies.  Family History  Problem Relation Age of Onset   Hypothyroidism Mother    Sarcoidosis Mother     Social History Social History   Tobacco Use   Smoking status: Never   Smokeless tobacco: Never  Vaping Use   Vaping Use: Never used  Substance Use Topics   Alcohol use: Never   Drug use: Never    Review of Systems  Review of Systems  Constitutional:  Positive for fatigue. Negative for chills and fever.  HENT:  Negative for sore  throat.   Respiratory:  Negative for shortness of breath.   Cardiovascular:  Negative for chest pain.  Gastrointestinal:  Negative for abdominal pain.  Genitourinary:  Negative for flank pain.  Musculoskeletal:  Positive for gait problem. Negative for neck pain.  Skin:  Negative for rash and wound.  Allergic/Immunologic: Negative for immunocompromised state.  Neurological:  Positive for weakness. Negative for numbness.  Hematological:  Does not bruise/bleed easily.  All other systems reviewed and are negative.   ____________________________________________  PHYSICAL EXAM:      VITAL SIGNS: ED Triage Vitals  Enc Vitals Group     BP 03/11/21 1259 (!) 124/94     Pulse Rate 03/11/21 1259 64     Resp 03/11/21 1259 20     Temp 03/11/21 1305 98.3 F (36.8 C)  Temp Source 03/11/21 1305 Oral     SpO2 03/11/21 1259 96 %     Weight --      Height --      Head Circumference --      Peak Flow --      Pain Score --      Pain Loc --      Pain Edu? --      Excl. in GC? --      Physical Exam Vitals and nursing note reviewed.  Constitutional:      General: He is not in acute distress.    Appearance: He is well-developed.  HENT:     Head: Normocephalic and atraumatic.     Mouth/Throat:     Mouth: Mucous membranes are dry.  Eyes:     Conjunctiva/sclera: Conjunctivae normal.  Cardiovascular:     Rate and Rhythm: Normal rate and regular rhythm.     Heart sounds: Normal heart sounds. No murmur heard.   No friction rub.  Pulmonary:     Effort: Pulmonary effort is normal. No respiratory distress.     Breath sounds: Normal breath sounds. No wheezing or rales.  Abdominal:     General: There is no distension.     Palpations: Abdomen is soft.     Tenderness: There is no abdominal tenderness.  Musculoskeletal:     Cervical back: Neck supple.  Skin:    General: Skin is warm.     Capillary Refill: Capillary refill takes less than 2 seconds.  Neurological:     Mental Status: He is  alert and oriented to person, place, and time.     Motor: No abnormal muscle tone.      ____________________________________________   LABS (all labs ordered are listed, but only abnormal results are displayed)  Labs Reviewed  CBC WITH DIFFERENTIAL/PLATELET - Abnormal; Notable for the following components:      Result Value   MCH 34.7 (*)    MCHC 36.1 (*)    All other components within normal limits  RESP PANEL BY RT-PCR (FLU A&B, COVID) ARPGX2  LAMOTRIGINE LEVEL  COMPREHENSIVE METABOLIC PANEL  CK  HIV ANTIBODY (ROUTINE TESTING W REFLEX)  CBC  CREATININE, SERUM  TSH  TROPONIN I (HIGH SENSITIVITY)  TROPONIN I (HIGH SENSITIVITY)    ____________________________________________  EKG: Normal sinus rhythm, ventricular rate 93.  PR 158, QRS 86, QTc 452.  No acute ST elevations or depressions.  No EKG evidence of acute ischemia or infarct. ________________________________________  RADIOLOGY All imaging, including plain films, CT scans, and ultrasounds, independently reviewed by me, and interpretations confirmed via formal radiology reads.  ED MD interpretation:   Chest x-ray: No active disease CT head: Negative MR brain from yesterday: Negative  Official radiology report(s): DG Chest 2 View  Result Date: 03/10/2021 CLINICAL DATA:  Altered mental status. EXAM: CHEST - 2 VIEW COMPARISON:  Chest radiograph dated 03/29/2018 FINDINGS: There is mild eventration of the right hemidiaphragm. Left lung base atelectasis. No focal consolidation, pleural effusion, or pneumothorax. The cardiac silhouette is within normal limits. No acute osseous pathology. IMPRESSION: No active cardiopulmonary disease. Electronically Signed   By: Elgie Collard M.D.   On: 03/10/2021 19:19   CT Head Wo Contrast  Result Date: 03/10/2021 CLINICAL DATA:  Concern for intracranial hemorrhage. EXAM: CT HEAD WITHOUT CONTRAST TECHNIQUE: Contiguous axial images were obtained from the base of the skull through the  vertex without intravenous contrast. COMPARISON:  Head CT report dated 12/25/1999. FINDINGS: Brain: Mild asymmetric  atrophy of the left cerebral cortex. The ventricles are appropriate size for patient's age. The gray-white matter discrimination is preserved. There is no acute intracranial hemorrhage. No mass effect or midline shift. No extra-axial fluid collection. Vascular: No hyperdense vessel or unexpected calcification. Skull: Normal. Negative for fracture or focal lesion. Sinuses/Orbits: No acute finding. Other: None IMPRESSION: 1. No acute intracranial pathology. 2. Mild asymmetric atrophy of the left cerebral cortex. Electronically Signed   By: Elgie Collard M.D.   On: 03/10/2021 19:27   MR BRAIN WO CONTRAST  Result Date: 03/10/2021 CLINICAL DATA:  Acute neurologic deficit EXAM: MRI HEAD WITHOUT CONTRAST TECHNIQUE: Multiplanar, multiecho pulse sequences of the brain and surrounding structures were obtained without intravenous contrast. COMPARISON:  None. FINDINGS: Brain: No acute infarct, mass effect or extra-axial collection. No acute or chronic hemorrhage. Normal white matter signal, parenchymal volume and CSF spaces. The midline structures are normal. Vascular: Major flow voids are preserved. Skull and upper cervical spine: Normal calvarium and skull base. Visualized upper cervical spine and soft tissues are normal. Sinuses/Orbits:No paranasal sinus fluid levels or advanced mucosal thickening. No mastoid or middle ear effusion. Normal orbits. IMPRESSION: Normal brain MRI. Electronically Signed   By: Deatra Robinson M.D.   On: 03/10/2021 22:50   MR LUMBAR SPINE WO CONTRAST  Result Date: 03/10/2021 CLINICAL DATA:  Inability to stand or wall EXAM: MRI LUMBAR SPINE WITHOUT CONTRAST TECHNIQUE: Multiplanar, multisequence MR imaging of the lumbar spine was performed. No intravenous contrast was administered. COMPARISON:  None. FINDINGS: Segmentation:  Standard Alignment:  Normal Vertebrae:  No  fracture, evidence of discitis, or bone lesion. Conus medullaris and cauda equina: Conus extends to the L1 level. Conus and cauda equina appear normal. Paraspinal and other soft tissues: Negative. Disc levels: L1-L2: Normal disc space and facet joints. No spinal canal stenosis. No neural foraminal stenosis. L2-L3: Small central annular fissure without herniation. No spinal canal stenosis. No neural foraminal stenosis. L3-L4: Normal disc space and facet joints. No spinal canal stenosis. No neural foraminal stenosis. L4-L5: Small disc bulge. No spinal canal stenosis. No neural foraminal stenosis. L5-S1: Normal disc space and facet joints. No spinal canal stenosis. No neural foraminal stenosis. Visualized sacrum: Normal. IMPRESSION: 1. No acute abnormality of the lumbar spine. 2. Mild lumbar degenerative disc disease without spinal canal or neural foraminal stenosis. Electronically Signed   By: Deatra Robinson M.D.   On: 03/10/2021 22:52    ____________________________________________  PROCEDURES   Procedure(s) performed (including Critical Care):  Procedures  ____________________________________________  INITIAL IMPRESSION / MDM / ASSESSMENT AND PLAN / ED COURSE  As part of my medical decision making, I reviewed the following data within the electronic MEDICAL RECORD NUMBER Nursing notes reviewed and incorporated, Old chart reviewed, Notes from prior ED visits, and [place] Controlled Substance Database       *Jared Mayer was evaluated in Emergency Department on 03/11/2021 for the symptoms described in the history of present illness. He was evaluated in the context of the global COVID-19 pandemic, which necessitated consideration that the patient might be at risk for infection with the SARS-CoV-2 virus that causes COVID-19. Institutional protocols and algorithms that pertain to the evaluation of patients at risk for COVID-19 are in a state of rapid change based on information released by regulatory bodies  including the CDC and federal and state organizations. These policies and algorithms were followed during the patient's care in the ED.  Some ED evaluations and interventions may be delayed as a result of limited staffing  during the pandemic.*     Medical Decision Making: 51 year old male here with generalized weakness and gait difficulty.  Patient had a fairly extensive work-up yesterday, which I reviewed the results and imaging for.  He had an unremarkable CT head as well as MRI of his brain and lumbar spine.  Urinalysis showed dehydration but was otherwise unremarkable.  CBC and CMP unremarkable.  Since then, we have learned that patient has been receiving over 3X his Lamictal dose for the last several days.  This correlates with the onset of his symptoms.  Suspect lithium toxicity contributing to drowsiness, tremors, and gait abnormalities.  He is requiring essentially 3 person assist now and is significantly off his baseline.  Will admit for holding his Lamictal, PT and OT, and hydration.  Otherwise, will check CK as this can cause rhabdomyolysis.  There is rare evidence of Lamictal induced aseptic meningitis but clinically the patient has no evidence to suggest meningitis or encephalitis at this time.  ____________________________________________  FINAL CLINICAL IMPRESSION(S) / ED DIAGNOSES  Final diagnoses:  Generalized weakness  Gait abnormality     MEDICATIONS GIVEN DURING THIS VISIT:  Medications  topiramate (TOPAMAX) tablet 75 mg (has no administration in time range)  atorvastatin (LIPITOR) tablet 40 mg (has no administration in time range)  docusate sodium (COLACE) capsule 100 mg (has no administration in time range)  omega-3 acid ethyl esters (LOVAZA) capsule 1,000 mg (has no administration in time range)  sodium fluoride (PREVIDENT 5000 PLUS) 1.1 % dental cream 1 application (has no administration in time range)  cholecalciferol (VITAMIN D3) tablet 1,000 Units (has no  administration in time range)  levothyroxine (SYNTHROID) tablet 112 mcg (has no administration in time range)  buPROPion (WELLBUTRIN XL) 24 hr tablet 300 mg (has no administration in time range)  OLANZapine (ZYPREXA) tablet 30 mg (has no administration in time range)  divalproex (DEPAKOTE ER) 24 hr tablet 500-2,000 mg (has no administration in time range)  sucralfate (CARAFATE) tablet 1 g (has no administration in time range)  linaclotide (LINZESS) capsule 290 mcg (has no administration in time range)  polyethylene glycol powder (GLYCOLAX/MIRALAX) container 34 g (has no administration in time range)  hydrOXYzine (ATARAX/VISTARIL) tablet 25 mg (has no administration in time range)  glycopyrrolate (ROBINUL) tablet 1 mg (has no administration in time range)  chlorhexidine (PERIDEX) 0.12 % solution 7.5 mL (has no administration in time range)  LORazepam (ATIVAN) tablet 0.5-1 mg (has no administration in time range)  valbenazine (INGREZZA) capsule 80 mg (has no administration in time range)  simethicone (MYLICON) chewable tablet 80 mg (has no administration in time range)  loratadine (CLARITIN) tablet 10 mg (has no administration in time range)  pantoprazole (PROTONIX) EC tablet 40 mg (has no administration in time range)  enoxaparin (LOVENOX) injection 40 mg (has no administration in time range)  0.9 %  sodium chloride infusion (has no administration in time range)  lactated ringers bolus 1,000 mL (1,000 mLs Intravenous New Bag/Given 03/11/21 1448)     ED Discharge Orders     None        Note:  This document was prepared using Dragon voice recognition software and may include unintentional dictation errors.   Shaune Pollack, MD 03/11/21 [id number]

## 2021-03-11 NOTE — ED Triage Notes (Signed)
[facility] from [facility]. Pt was seen yesterday per mom at bedside. Pt has been give 700mg  of Lamictal instead of 100mg  of Lamictal for the past 3 days. Pt was more weak than normal but has been better today.   Pt is A&Ox4 and NAD.

## 2021-03-11 NOTE — Consult Note (Signed)
[facility] Face-to-Face Psychiatry Consult   Reason for Consult: Consult for 50 year old man brought in from his group home because of altered mental status and multiple falls. Referring Physician:  Sherryll Burger Patient Identification: Jared Mayer MRN:  [id number] Principal Diagnosis: Altered mental status Diagnosis:  Principal Problem:   Altered mental status Active Problems:   Schizoaffective disorder, bipolar type (HCC)   Accidental overdose of lamotrigine   Mild intellectual disability   Total Time spent with patient: 1 hour  Subjective:   Jared Mayer is a 50 y.o. male patient admitted with "I had a fall".  HPI: Patient seen chart reviewed.  Patient's mother who is his guardian he is here with him.  50 year old man with a history of intellectual disability and schizoaffective disorder.  Mother reports that for the past couple days at least they have been told that he has been unsteady on his feet and has had more than 1 fall.  He was brought into the emergency room on 1 occasion with a concern that he could be running a fever but by the time he got here the symptoms were diminished and the work-up was negative.  Went back to the group home and had another fall out of a chair bumping his head.  Mother notes that he seems a little more lethargic than usual.  Also tremor was notably worse than usual.  Also when she and her husband attempted to help the patient stand up he seemed to be very weak on his feet.  Patient himself has some limited ability to give history.  Says he has had more than 1 fall recently and that when he did it felt sort of like he was blacking out.  Able to tell me that he hit his head.  Patient is not reporting any active mood symptoms.  Denies psychotic symptoms.  Denies any suicidal or homicidal ideation.  Mother reports that she has received a text message now from a representative of the group home saying that they suspect that the patient "may have" been given 700 mg a day of  lamotrigine for more than 1 day rather than his usual 100 mg dose.  Past Psychiatric History: Patient has a history of schizoaffective disorder.  He is on multiple medications including valproic acid lamotrigine olanzapine.  Documents in chart indicates some past history of mania.  No recent hospitalizations at least.  Risk to Self:   Risk to Others:   Prior Inpatient Therapy:   Prior Outpatient Therapy:    Past Medical History:  Past Medical History:  Diagnosis Date   Dyslipidemia    Hypothyroid    Manic behavior (HCC)    Mentally disabled    Schizoaffective disorder, bipolar type (HCC)    History reviewed. No pertinent surgical history. Family History:  Family History  Problem Relation Age of Onset   Hypothyroidism Mother    Sarcoidosis Mother    Family Psychiatric  History: Unknown Social History:  Social History   Substance and Sexual Activity  Alcohol Use Never     Social History   Substance and Sexual Activity  Drug Use Never    Social History   Socioeconomic History   Marital status: Single    Spouse name: Not on file   Number of children: Not on file   Years of education: Not on file   Highest education level: Not on file  Occupational History   Not on file  Tobacco Use   Smoking status: Never   Smokeless tobacco: Never  Vaping Use   Vaping Use: Never used  Substance and Sexual Activity   Alcohol use: Never   Drug use: Never   Sexual activity: Never  Other Topics Concern   Not on file  Social History Narrative   Not on file   Social Determinants of Health   Financial Resource Strain: Not on file  Food Insecurity: Not on file  Transportation Needs: Not on file  Physical Activity: Not on file  Stress: Not on file  Social Connections: Not on file   Additional Social History:    Allergies:  No Known Allergies  Labs:  Results for orders placed or performed during the hospital encounter of 03/11/21 (from the past 48 hour(s))  Resp Panel by  RT-PCR (Flu A&B, Covid) Nasopharyngeal Swab     Status: None   Collection Time: 03/11/21  2:44 PM   Specimen: Nasopharyngeal Swab; Nasopharyngeal(NP) swabs in vial transport medium  Result Value Ref Range   SARS Coronavirus 2 by RT PCR NEGATIVE NEGATIVE    Comment: (NOTE) SARS-CoV-2 target nucleic acids are NOT DETECTED.  The SARS-CoV-2 RNA is generally detectable in upper respiratory specimens during the acute phase of infection. The lowest concentration of SARS-CoV-2 viral copies this assay can detect is 138 copies/mL. A negative result does not preclude SARS-Cov-2 infection and should not be used as the sole basis for treatment or other patient management decisions. A negative result may occur with  improper specimen collection/handling, submission of specimen other than nasopharyngeal swab, presence of viral mutation(s) within the areas targeted by this assay, and inadequate number of viral copies(<138 copies/mL). A negative result must be combined with clinical observations, patient history, and epidemiological information. The expected result is Negative.  Fact Sheet for Patients:  BloggerCourse.com  Fact Sheet for Healthcare Providers:  SeriousBroker.it  This test is no t yet approved or cleared by the [place] FDA and  has been authorized for detection and/or diagnosis of SARS-CoV-2 by FDA under an Emergency Use Authorization (EUA). This EUA will remain  in effect (meaning this test can be used) for the duration of the COVID-19 declaration under Section 564(b)(1) of the Act, 21 U.S.C.section 360bbb-3(b)(1), unless the authorization is terminated  or revoked sooner.       Influenza A by PCR NEGATIVE NEGATIVE   Influenza B by PCR NEGATIVE NEGATIVE    Comment: (NOTE) The Xpert Xpress SARS-CoV-2/FLU/RSV plus assay is intended as an aid in the diagnosis of influenza from Nasopharyngeal swab specimens and should not be  used as a sole basis for treatment. Nasal washings and aspirates are unacceptable for Xpert Xpress SARS-CoV-2/FLU/RSV testing.  Fact Sheet for Patients: BloggerCourse.com  Fact Sheet for Healthcare Providers: SeriousBroker.it  This test is not yet approved or cleared by the [place] FDA and has been authorized for detection and/or diagnosis of SARS-CoV-2 by FDA under an Emergency Use Authorization (EUA). This EUA will remain in effect (meaning this test can be used) for the duration of the COVID-19 declaration under Section 564(b)(1) of the Act, 21 U.S.C. section 360bbb-3(b)(1), unless the authorization is terminated or revoked.  Performed at [facility], [address] [place], [place] [address]   CBC with Differential     Status: Abnormal   Collection Time: 03/11/21  2:44 PM  Result Value Ref Range   WBC 7.1 4.0 - 10.5 K/uL   RBC 4.35 4.22 - 5.81 MIL/uL   Hemoglobin 15.1 13.0 - 17.0 g/dL   HCT 99.3 57.0 - 17.7 %  MCV 96.1 80.0 - 100.0 fL   MCH 34.7 (H) 26.0 - 34.0 pg   MCHC 36.1 (H) 30.0 - 36.0 g/dL   RDW 16.3 84.5 - 36.4 %   Platelets 177 150 - 400 K/uL   nRBC 0.0 0.0 - 0.2 %   Neutrophils Relative % 68 %   Neutro Abs 4.8 1.7 - 7.7 K/uL   Lymphocytes Relative 19 %   Lymphs Abs 1.4 0.7 - 4.0 K/uL   Monocytes Relative 13 %   Monocytes Absolute 0.9 0.1 - 1.0 K/uL   Eosinophils Relative 0 %   Eosinophils Absolute 0.0 0.0 - 0.5 K/uL   Basophils Relative 0 %   Basophils Absolute 0.0 0.0 - 0.1 K/uL   Immature Granulocytes 0 %   Abs Immature Granulocytes 0.02 0.00 - 0.07 K/uL    Comment: Performed at [facility], [address], [place], [place] [address]  Troponin I (High Sensitivity)     Status: None   Collection Time: 03/11/21  2:44 PM  Result Value Ref Range   Troponin I (High Sensitivity) 11 <18 ng/L    Comment: (NOTE) Elevated high sensitivity troponin I (hsTnI) values and significant   changes across serial measurements may suggest ACS but many other  chronic and acute conditions are known to elevate hsTnI results.  Refer to the "Links" section for chest pain algorithms and additional  guidance. Performed at [facility], [address] [place], [place] [address]     Current Facility-Administered Medications  Medication Dose Route Frequency Provider Last Rate Last Admin   0.9 %  sodium chloride infusion   Intravenous Continuous Delfino Lovett, MD       atorvastatin (LIPITOR) tablet 40 mg  40 mg Oral Daily Delfino Lovett, MD       [START ON 03/12/2021] buPROPion (WELLBUTRIN XL) 24 hr tablet 300 mg  300 mg Oral Daily Delfino Lovett, MD       chlorhexidine (PERIDEX) 0.12 % solution 7.5 mL  7.5 mL Mouth Rinse BID Delfino Lovett, MD       [START ON 03/12/2021] cholecalciferol (VITAMIN D3) tablet 1,000 Units  1,000 Units Oral Daily Delfino Lovett, MD       divalproex (DEPAKOTE ER) 24 hr tablet 500-2,000 mg  500-2,000 mg Oral See admin instructions Delfino Lovett, MD       docusate sodium (COLACE) capsule 100 mg  100 mg Oral BID Delfino Lovett, MD       enoxaparin (LOVENOX) injection 40 mg  40 mg Subcutaneous Q24H Sherryll Burger, Vipul, MD       glycopyrrolate (ROBINUL) tablet 1 mg  1 mg Oral BID Delfino Lovett, MD       hydrOXYzine (ATARAX/VISTARIL) tablet 25 mg  25 mg Oral BID Delfino Lovett, MD       levothyroxine (SYNTHROID) tablet 112 mcg  112 mcg Oral Daily Delfino Lovett, MD       linaclotide (LINZESS) capsule 290 mcg  290 mcg Oral Daily Sherryll Burger, Vipul, MD       loratadine (CLARITIN) tablet 10 mg  10 mg Oral Daily Sherryll Burger, Vipul, MD       LORazepam (ATIVAN) tablet 0.5-1 mg  0.5-1 mg Oral BID Delfino Lovett, MD       OLANZapine (ZYPREXA) tablet 30 mg  30 mg Oral QHS Sherryll Burger, Vipul, MD       omega-3 acid ethyl esters (LOVAZA) capsule 1,000 mg  1,000 mg Oral BID Delfino Lovett, MD       pantoprazole (PROTONIX) EC  tablet 40 mg  40 mg Oral Daily Sherryll Burger, Vipul, MD       polyethylene glycol powder (GLYCOLAX/MIRALAX)  container 34 g  34 g Oral Daily Delfino Lovett, MD       simethicone (MYLICON) chewable tablet 80 mg  80 mg Oral Q6H PRN Delfino Lovett, MD       sodium fluoride (PREVIDENT 5000 PLUS) 1.1 % dental cream 1 application  1 application dental BID Delfino Lovett, MD       sucralfate (CARAFATE) tablet 1 g  1 g Oral BID Delfino Lovett, MD       topiramate (TOPAMAX) tablet 75 mg  75 mg Oral BID Delfino Lovett, MD       valbenazine [facility]) capsule 80 mg  80 mg Oral QHS Delfino Lovett, MD       Current Outpatient Medications  Medication Sig Dispense Refill   atorvastatin (LIPITOR) 40 MG tablet Take 40 mg by mouth daily.     buPROPion (WELLBUTRIN XL) 150 MG 24 hr tablet Take 300 mg by mouth daily.     chlorhexidine (PERIDEX) 0.12 % solution 7.5 mLs by Mouth Rinse route 2 (two) times daily. After brushing teeth. Do not swallow or rinse with water or mouthwash after.     cholecalciferol (VITAMIN D3) 25 MCG (1000 UT) tablet Take 1,000 Units by mouth daily.     divalproex (DEPAKOTE ER) 500 MG 24 hr tablet Take 500-2,000 mg by mouth See admin instructions. 500 mg every morning and 2000 mg at bedtime     docusate sodium (COLACE) 100 MG capsule Take 100 mg by mouth 2 (two) times daily.     fexofenadine (ALLEGRA) 180 MG tablet Take 180 mg by mouth daily as needed for allergies.      fluticasone (FLONASE) 50 MCG/ACT nasal spray Place 2 sprays into the nose daily as needed for allergies or rhinitis.      glycopyrrolate (ROBINUL) 1 MG tablet Take 1 mg by mouth 2 (two) times daily.     hydrOXYzine (ATARAX/VISTARIL) 25 MG tablet Take 25 mg by mouth 2 (two) times daily.     INGREZZA 80 MG capsule Take 80 mg by mouth at bedtime.     lamoTRIgine (LAMICTAL) 100 MG tablet Take 100 mg by mouth daily.     levothyroxine (SYNTHROID) 112 MCG tablet Take 112 mcg by mouth daily. On an empty stomach with a glass of water 30-60 minutes before breakfast     linaclotide (LINZESS) 290 MCG CAPS capsule Take 290 mcg by mouth daily.     LORazepam  (ATIVAN) 0.5 MG tablet Take 0.5-1 mg by mouth 2 (two) times daily. Take 1 tablet (0.5 mg) each morning and 2 tablets (1 mg) at bedtime     OLANZapine (ZYPREXA) 15 MG tablet Take 30 mg by mouth at bedtime.     Omega-3 Fatty Acids (FISH OIL) 1000 MG CAPS Take 1,000 mg by mouth 2 (two) times daily.     omeprazole (PRILOSEC) 20 MG capsule Take 40 mg by mouth daily.     polyethylene glycol powder (GLYCOLAX/MIRALAX) 17 GM/SCOOP powder Take 34 g by mouth daily. Mix with 8 ounces of water     simethicone (MYLICON) 80 MG chewable tablet Chew 80 mg by mouth every 6 (six) hours as needed for flatulence.     sodium fluoride (PREVIDENT 5000 PLUS) 1.1 % CREA dental cream Place 1 application onto teeth 2 (two) times daily.     sucralfate (CARAFATE) 1 g tablet Take 1 g by  mouth 2 (two) times daily. Before meals     SYSTANE COMPLETE 0.6 % SOLN Place 1 drop into both eyes 3 (three) times daily as needed for dry eyes.     topiramate (TOPAMAX) 25 MG tablet Take 75 mg by mouth 2 (two) times daily.     triamcinolone cream (KENALOG) 0.1 % Apply 1 application topically 2 (two) times daily as needed (for inflammation/irritation).      Musculoskeletal: Strength & Muscle Tone: decreased Gait & Station: unsteady Patient leans: N/A            Psychiatric Specialty Exam:  Presentation  General Appearance: No data recorded Eye Contact:No data recorded Speech:No data recorded Speech Volume:No data recorded Handedness:No data recorded  Mood and Affect  Mood:No data recorded Affect:No data recorded  Thought Process  Thought Processes:No data recorded Descriptions of Associations:No data recorded Orientation:No data recorded Thought Content:No data recorded History of Schizophrenia/Schizoaffective disorder:No data recorded Duration of Psychotic Symptoms:No data recorded Hallucinations:No data recorded Ideas of Reference:No data recorded Suicidal Thoughts:No data recorded Homicidal Thoughts:No data  recorded  Sensorium  Memory:No data recorded Judgment:No data recorded Insight:No data recorded  Executive Functions  Concentration:No data recorded Attention Span:No data recorded Recall:No data recorded Fund of Knowledge:No data recorded Language:No data recorded  Psychomotor Activity  Psychomotor Activity:No data recorded  Assets  Assets:No data recorded  Sleep  Sleep:No data recorded  Physical Exam: Physical Exam Vitals and nursing note reviewed.  Constitutional:      Appearance: Normal appearance.  HENT:     Head: Normocephalic and atraumatic.     Mouth/Throat:     Pharynx: Oropharynx is clear.  Eyes:     Pupils: Pupils are equal, round, and reactive to light.  Cardiovascular:     Rate and Rhythm: Normal rate and regular rhythm.  Pulmonary:     Effort: Pulmonary effort is normal.     Breath sounds: Normal breath sounds.  Abdominal:     General: Abdomen is flat.     Palpations: Abdomen is soft.  Musculoskeletal:        General: Normal range of motion.  Skin:    General: Skin is warm and dry.  Neurological:     General: No focal deficit present.     Mental Status: He is alert. Mental status is at baseline.  Psychiatric:        Attention and Perception: He is inattentive.        Mood and Affect: Mood normal. Affect is blunt.        Speech: Speech is tangential.        Behavior: Behavior is slowed.        Thought Content: Thought content normal.        Cognition and Memory: Cognition is impaired.   Review of Systems  Constitutional: Negative.   HENT: Negative.    Eyes: Negative.   Respiratory: Negative.    Cardiovascular: Negative.   Gastrointestinal: Negative.   Musculoskeletal: Negative.   Skin: Negative.   Neurological:  Positive for dizziness, tremors and headaches.  Psychiatric/Behavioral:  Negative for depression, hallucinations, memory loss, substance abuse and suicidal ideas. The patient is not nervous/anxious and does not have insomnia.    Blood pressure 125/77, pulse 93, temperature 98.3 F (36.8 C), temperature source Oral, resp. rate 20, SpO2 95 %. There is no height or weight on file to calculate BMI.  Treatment Plan Summary: Medication management and Plan 50 year old man on multiple medications.  Valproic acid level was in the  normal range.  The rest of the work-up so far has been unrevealing.  Lamotrigine blood level is a test that is available but usually requires at least a day if not 2 to be done as a send out lab.  That has been ordered.  Even 1 or 2 days of a dramatic increase in the lamotrigine dose could certainly account for the symptoms that he is having.  Particularly given that Depakote inhibits lamotrigine metabolism a rapid increase in dosage could result in a very large bump in blood level.  At this point his symptoms are resolving.  Plan is for patient to be in observation for a day or 2.  We will hold off on restarting his lamotrigine for at least a day.  No other changes to medication.  Mother is in agreement with plan.  Disposition: No evidence of imminent risk to self or others at present.   Patient does not meet criteria for psychiatric inpatient admission. Supportive therapy provided about ongoing stressors.  Mordecai Rasmussen, MD 03/11/2021 5:03 PM

## 2021-03-11 NOTE — Assessment & Plan Note (Signed)
Psych c/s and appreciate Dr clapacs input

## 2021-03-11 NOTE — Assessment & Plan Note (Addendum)
Thought to be due to Lamictal overdose. Although his mentation is slowly improving

## 2021-03-11 NOTE — Assessment & Plan Note (Signed)
Thought to be due to Lamictal overdose. Mother reports 4 falls in last 1-2 days

## 2021-03-11 NOTE — Assessment & Plan Note (Signed)
At baseline 

## 2021-03-11 NOTE — Assessment & Plan Note (Addendum)
Received 700 mg over last 3 days. Normally gets 100 mg bid but instead might have received 300 mg in am & 400 mg in pm for last 3 days per mother. Level has been sent out by EDP but may take few days to result as it's send out lab

## 2021-03-12 DIAGNOSIS — T426X1A Poisoning by other antiepileptic and sedative-hypnotic drugs, accidental (unintentional), initial encounter: Secondary | ICD-10-CM | POA: Diagnosis not present

## 2021-03-12 LAB — LAMOTRIGINE LEVEL: Lamotrigine Lvl: 18.4 ug/mL (ref 2.0–20.0)

## 2021-03-12 LAB — CBC
HCT: 41.8 % (ref 39.0–52.0)
Hemoglobin: 14 g/dL (ref 13.0–17.0)
MCH: 32.3 pg (ref 26.0–34.0)
MCHC: 33.5 g/dL (ref 30.0–36.0)
MCV: 96.5 fL (ref 80.0–100.0)
Platelets: 149 10*3/uL — ABNORMAL LOW (ref 150–400)
RBC: 4.33 MIL/uL (ref 4.22–5.81)
RDW: 14 % (ref 11.5–15.5)
WBC: 5.3 10*3/uL (ref 4.0–10.5)
nRBC: 0 % (ref 0.0–0.2)

## 2021-03-12 LAB — BASIC METABOLIC PANEL
Anion gap: 3 — ABNORMAL LOW (ref 5–15)
BUN: 11 mg/dL (ref 6–20)
CO2: 31 mmol/L (ref 22–32)
Calcium: 8.2 mg/dL — ABNORMAL LOW (ref 8.9–10.3)
Chloride: 108 mmol/L (ref 98–111)
Creatinine, Ser: 0.82 mg/dL (ref 0.61–1.24)
GFR, Estimated: >60 mL/min (ref >60)
Glucose, Bld: 88 mg/dL (ref 70–99)
Potassium: 3.2 mmol/L — ABNORMAL LOW (ref 3.5–5.1)
Sodium: 142 mmol/L (ref 135–145)

## 2021-03-12 LAB — HIV ANTIBODY (ROUTINE TESTING W REFLEX): HIV Screen 4th Generation wRfx: NONREACTIVE

## 2021-03-12 LAB — T3, FREE: T3, Free: 2.2 pg/mL (ref 2.0–4.4)

## 2021-03-12 LAB — CK: Total CK: 2823 U/L — ABNORMAL HIGH (ref 49–397)

## 2021-03-12 MED ORDER — DIVALPROEX SODIUM ER 500 MG PO TB24
500.0000 mg | ORAL_TABLET | Freq: Every day | ORAL | Status: DC
  Administered 2021-03-12 – 2021-03-13 (×2): 500 mg via ORAL
  Filled 2021-03-12 (×2): qty 1

## 2021-03-12 MED ORDER — DIVALPROEX SODIUM ER 500 MG PO TB24
2000.0000 mg | ORAL_TABLET | Freq: Every day | ORAL | Status: DC
  Administered 2021-03-12: 2000 mg via ORAL
  Filled 2021-03-12 (×2): qty 4

## 2021-03-12 MED ORDER — LORAZEPAM 0.5 MG PO TABS
0.5000 mg | ORAL_TABLET | Freq: Every day | ORAL | Status: DC
  Administered 2021-03-13: 0.5 mg via ORAL
  Filled 2021-03-12: qty 1

## 2021-03-12 MED ORDER — LORAZEPAM 1 MG PO TABS
1.0000 mg | ORAL_TABLET | Freq: Every day | ORAL | Status: DC
  Administered 2021-03-12: 1 mg via ORAL
  Filled 2021-03-12: qty 1

## 2021-03-12 MED ORDER — POTASSIUM CHLORIDE CRYS ER 20 MEQ PO TBCR
40.0000 meq | EXTENDED_RELEASE_TABLET | ORAL | Status: AC
  Administered 2021-03-12 (×2): 40 meq via ORAL
  Filled 2021-03-12 (×2): qty 2
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
## 2021-03-12 NOTE — Evaluation (Addendum)
Occupational Therapy Evaluation Patient Details Name: Jared Mayer MRN: [id number] DOB: 1971/01/29 Today's Date: 03/12/2021   History of Present Illness Jared Mayer is a 50 y.o. male with history of schizoaffective disorder, hyperlipidemia, here with generalized weakness.  The patient was here yesterday because over the last 2 days, he has increasing weakness, difficulty walking, tremors.  He has had multiple falls.  He has been more confused.  He has also had questionable low-grade fever and chills, as well as body aches in his bilateral legs.   Clinical Impression   Chart reviewed, RN cleared pt for participation in OT evaluation. Pt pleasant and agreeable, oriented to self and vaguely to situation (I fell). As per chart review pt is performing below PLOF where he was completing ADL with MOD I-Independence besides some grooming tasks. Pt received assistance for IADL tasks (cooking, cleaning, driving). Pt required MOD A for supine>sit, MIN A for STS, sit>supine; Pt required MAX A for LB dressing, MOD A for grooming tasks seated at edge of bed. Pt left in care of RN and MD, NAD, all needs met. Generalized weakness noted throughout BUE and BLE affecting functional task completion. OT recommends discharge home with HHOT to address functional deficits. OT will continue to follow while admitted.      Recommendations for follow up therapy are one component of a multi-disciplinary discharge planning process, led by the attending physician.  Recommendations may be updated based on patient status, additional functional criteria and insurance authorization.   Follow Up Recommendations  Home health OT    Assistance Recommended at Discharge Frequent or constant Supervision/Assistance  Functional Status Assessment  Patient has had a recent decline in their functional status and demonstrates the ability to make significant improvements in function in a reasonable and predictable amount of time.  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    Transfers Overall transfer level: Needs assistance Equipment used: Rolling walker (2 wheels) Transfers: Sit to/from Stand Sit to Stand: Min assist           General transfer comment: step by step vcs      Balance Overall balance assessment: Needs assistance Sitting-balance support: Feet supported;No upper extremity supported Sitting balance-Leahy Scale: Fair     Standing balance support: Bilateral upper extremity supported;Single extremity supported;During functional activity Standing balance-Leahy Scale: Fair                            ADL either performed or assessed with clinical judgement   ADL Overall ADL's : Needs assistance/impaired     Grooming: Wash/dry face;Minimal assistance   Upper Body Bathing: Minimal assistance;Sitting       Upper Body Dressing : Minimal assistance;Sitting   Lower Body Dressing: Maximal assistance       Toileting- Clothing Manipulation and Hygiene: Maximal assistance Toileting - Clothing Manipulation Details (indicate cue type and reason): for brief     Functional mobility during ADLs: Minimal assistance;Rolling walker (2 wheels) (for STS for LBD)  Vision         Perception     Praxis      Pertinent Vitals/Pain Pain Assessment: Faces Faces Pain Scale: Hurts a little bit Breathing: normal Negative Vocalization: none Facial Expression: smiling or inexpressive Body Language:  relaxed Consolability: distracted or reassured by voice/touch PAINAD Score: 1 Pain Location: Back Pain Intervention(s): Monitored during session;Repositioned;Limited activity within patient's tolerance     Hand Dominance     Extremity/Trunk Assessment Upper Extremity Assessment Upper Extremity Assessment: Generalized weakness   Lower Extremity Assessment Lower Extremity Assessment: Generalized weakness   Cervical / Trunk Assessment Cervical / Trunk Assessment: Kyphotic   Communication     Cognition Arousal/Alertness: Awake/alert Behavior During Therapy: Flat affect Overall Cognitive Status: History of cognitive impairments - at baseline                                 General Comments: poor historian at baseline                Home Living Family/patient expects to be discharged to:: Group home                                        Prior Functioning/Environment Prior Level of Function : Needs assist  Cognitive Assist : ADLs (cognitive);Mobility (cognitive)     Physical Assist : ADLs (physical);Mobility (physical) Mobility (physical): Gait (RW PRN per chart review) ADLs (physical): Grooming            OT Problem List: Decreased strength;Impaired balance (sitting and/or standing);Decreased cognition;Decreased safety awareness;Decreased activity tolerance;Decreased knowledge of use of DME or AE;Impaired UE functional use      OT Treatment/Interventions: Self-care/ADL training;DME and/or AE instruction;Therapeutic activities;Balance training;Therapeutic exercise;Cognitive remediation/compensation;Neuromuscular education;Patient/family education;Modalities    OT Goals(Current goals can be found in the care plan section) Acute Rehab OT Goals Patient Stated Goal: get better OT Goal Formulation: With patient Time For Goal Achievement: 03/26/21 Potential to Achieve Goals: Good ADL Goals Pt Will Perform Grooming: with modified  independence;sitting Pt Will Perform Upper Body Dressing: with supervision Pt Will Perform Lower Body Dressing: with supervision Pt Will Transfer to Toilet: with supervision  OT Frequency: Min 2X/week    AM-PAC OT "6 Clicks" Daily Activity     Outcome Measure Help from another person eating meals?: A Little Help from another person taking care of personal grooming?: A Little Help from another person toileting, which includes using toliet, bedpan, or urinal?: A Lot Help from another person bathing (including washing, rinsing, drying)?: A Lot Help from another person to put on and taking off regular upper body clothing?: A Little Help from another person to put on and taking off regular lower body clothing?: A Lot 6 Click Score: 15   End of Session Equipment Utilized During Treatment: Gait belt;Rolling walker (2 wheels) Nurse Communication: Mobility status  Activity Tolerance: Patient tolerated treatment well Patient left: in bed;with call bell/phone within reach;with bed alarm set;with nursing/sitter in room (MD present in room)  OT Visit Diagnosis: Unsteadiness on feet (R26.81);Other abnormalities of gait and mobility (R26.89);Repeated falls (R29.6)                Time: 2353-6144 OT Time Calculation (min): 20 min Charges:  OT General Charges $OT Visit: 1 Visit OT Treatments $Self Care/Home Management : 8-22 mins Shanon Payor, OTD OTR/L  03/12/21, 1:01 PM

## 2021-03-12 NOTE — Evaluation (Signed)
Physical Therapy Evaluation Patient Details Name: Jared Mayer MRN: [id number] DOB: 03-03-1971 Today's Date: 03/12/2021  History of Present Illness  Jared Mayer is a 50 y.o. male with history of schizoaffective disorder, hyperlipidemia, here with generalized weakness.  The patient was here yesterday because over the last 2 days, he has increasing weakness, difficulty walking, tremors.  He has had multiple falls.  He has been more confused.  He has also had questionable low-grade fever and chills, as well as body aches in his bilateral legs.   Clinical Impression  Pt admitted with above diagnosis. Pt received sitting up in recliner. Agreeable to PT services. Attempts made on recording PLOF, DME, lay out of group home with pt but given inaccurate info per EMR and mother. Pt hx of intellectual deficits at baseline. Per mother post eval, states pt indep with all ADL's/IADL's in group home with no use of AD but does have a RW. Unable to shave so does require assistance for that. Throughout session, pt does require intermittent, mod, multimodal cuing for safe mobility and navigating RW in hallway with minguard for transfers and amb. No LOB noted despite ant trunk lean with RW far outside BOS. Pt does display mild difficulty following simple commands requiring visual cues to assist pt in command following with good carryover. Pt returned to supine in bed with supervision. Hand off care to OT. Pt safe to return to group home environment. Will benefit from [facility] PT to return to ambulating with no AD and reduce risk of falls. Pt currently with functional limitations due to the deficits listed below (see PT Problem List). Pt will benefit from skilled PT to increase their independence and safety with mobility to allow discharge to the venue listed below.      Recommendations for follow up therapy are one component of a multi-disciplinary discharge planning process, led by the attending physician.  Recommendations may  be updated based on patient status, additional functional criteria and insurance authorization.  Follow Up Recommendations Home health PT    Assistance Recommended at Discharge Set up Supervision/Assistance  Functional Status Assessment Patient has had a recent decline in their functional status and demonstrates the ability to make significant improvements in function in a reasonable and predictable amount of time.  Equipment Recommendations  None recommended by PT    Recommendations for Other Services       Precautions / Restrictions Precautions Precautions: Fall Restrictions Weight Bearing Restrictions: No      Mobility  Bed Mobility Overal bed mobility: Needs Assistance Bed Mobility: Sit to Supine       Sit to supine: Min assist   General bed mobility comments: Did require minA but believe only due to difficulty following simple commands as pt has intellectual deficits at baseline. Patient Response: Cooperative;Flat affect  Transfers Overall transfer level: Needs assistance Equipment used: Rolling walker (2 wheels) Transfers: Sit to/from Stand Sit to Stand: Min guard           General transfer comment: safe use of hands with single VC    Ambulation/Gait Ambulation/Gait assistance: Min guard Gait Distance (Feet): 75 Feet Assistive device: Rolling walker (2 wheels) Gait Pattern/deviations: Step-to pattern;Drifts right/left;Narrow base of support;Trunk flexed     General Gait Details: Requires intermittent Max TC's on RW to avoid running into obstacles and people in hallway due to L drift. Per mother, pt has L drift at baseline and difficulty navigating obstacles. Amb at quick, short steps with RW outside BOS. Unable to follow cues/commands to  keep RW closer to BOS. No LOB or ant falls despite gait pattern.  Stairs            Wheelchair Mobility    Modified Rankin (Stroke Patients Only)       Balance Overall balance assessment: Needs  assistance Sitting-balance support: Feet supported;No upper extremity supported Sitting balance-Leahy Scale: Fair     Standing balance support: Bilateral upper extremity supported;Single extremity supported;During functional activity Standing balance-Leahy Scale: Fair Standing balance comment: Required RW for amb further distances. In room able to stand and take small steps at bedside with no AD                             Pertinent Vitals/Pain Pain Assessment: Faces Faces Pain Scale: Hurts a little bit Breathing: normal Negative Vocalization: none Facial Expression: smiling or inexpressive Body Language: relaxed Consolability: distracted or reassured by voice/touch PAINAD Score: 1 Pain Location: Back Pain Intervention(s): Monitored during session;Repositioned;Limited activity within patient's tolerance    Home Living                          Prior Function                       Hand Dominance        Extremity/Trunk Assessment   Upper Extremity Assessment Upper Extremity Assessment: Generalized weakness    Lower Extremity Assessment Lower Extremity Assessment: Generalized weakness    Cervical / Trunk Assessment Cervical / Trunk Assessment: Kyphotic  Communication      Cognition Arousal/Alertness: Awake/alert Behavior During Therapy: Flat affect Overall Cognitive Status: History of cognitive impairments - at baseline                                 General Comments: poor historian. Rely on mother for accurate info        General Comments General comments (skin integrity, edema, etc.): HR trending in low 100's at rest and during ambulation    Exercises Other Exercises Other Exercises: Role of PT in acute setting. educated mother on current function of son, need for DME, D/c recs   Assessment/Plan    PT Assessment Patient needs continued PT services  PT Problem List Decreased strength;Decreased  cognition;Decreased activity tolerance;Decreased knowledge of use of DME;Decreased balance;Decreased safety awareness;Decreased mobility       PT Treatment Interventions DME instruction;Therapeutic exercise;Gait training;Balance training;Neuromuscular re-education;Functional mobility training;Therapeutic activities;Patient/family education    PT Goals (Current goals can be found in the Care Plan section)  Acute Rehab PT Goals PT Goal Formulation: Patient unable to participate in goal setting    Frequency Min 2X/week   Barriers to discharge Decreased caregiver support lives at group home    Co-evaluation               AM-PAC PT "6 Clicks" Mobility  Outcome Measure Help needed turning from your back to your side while in a flat bed without using bedrails?: A Little Help needed moving from lying on your back to sitting on the side of a flat bed without using bedrails?: A Little Help needed moving to and from a bed to a chair (including a wheelchair)?: A Little Help needed standing up from a chair using your arms (e.g., wheelchair or bedside chair)?: A Little Help needed to walk in hospital room?: A Little  Help needed climbing 3-5 steps with a railing? : A Lot 6 Click Score: 17    End of Session Equipment Utilized During Treatment: Gait belt Activity Tolerance: Patient tolerated treatment well Patient left: in bed;with bed alarm set;with call bell/phone within reach (in care of OT) Nurse Communication: Mobility status PT Visit Diagnosis: Unsteadiness on feet (R26.81);Muscle weakness (generalized) (M62.81);History of falling (Z91.81);Difficulty in walking, not elsewhere classified (R26.2)    Time: 6568-1275 PT Time Calculation (min) (ACUTE ONLY): 22 min   Charges:   PT Evaluation $PT Eval Low Complexity: 1 Low PT Treatments $Gait Training: 8-22 mins       Delphia Grates. Fairly IV, PT, DPT Physical Therapist- [place]  [facility]  03/12/2021, 11:40  AM

## 2021-03-12 NOTE — Progress Notes (Signed)
Telemetry order discontinued. Telemetry box returned to tele holder in med room. 

## 2021-03-12 NOTE — Plan of Care (Signed)
  Problem: Education: Goal: Knowledge of General Education information will improve Description: Including pain rating scale, medication(s)/side effects and non-pharmacologic comfort measures 03/12/2021 0305 by Earnestine Mealing, RN Outcome: Not Progressing 03/12/2021 0305 by Earnestine Mealing, RN Outcome: Progressing   Problem: Health Behavior/Discharge Planning: Goal: Ability to manage health-related needs will improve 03/12/2021 0305 by Earnestine Mealing, RN Outcome: Progressing 03/12/2021 0305 by Earnestine Mealing, RN Outcome: Progressing   Problem: Clinical Measurements: Goal: Ability to maintain clinical measurements within normal limits will improve 03/12/2021 0305 by Earnestine Mealing, RN Outcome: Progressing 03/12/2021 0305 by Earnestine Mealing, RN Outcome: Progressing Goal: Will remain free from infection 03/12/2021 0305 by Earnestine Mealing, RN Outcome: Progressing 03/12/2021 0305 by Earnestine Mealing, RN Outcome: Progressing Goal: Diagnostic test results will improve 03/12/2021 0305 by Earnestine Mealing, RN Outcome: Progressing 03/12/2021 0305 by Earnestine Mealing, RN Outcome: Progressing Goal: Respiratory complications will improve 03/12/2021 0305 by Earnestine Mealing, RN Outcome: Progressing 03/12/2021 0305 by Earnestine Mealing, RN Outcome: Progressing Goal: Cardiovascular complication will be avoided 03/12/2021 0305 by Earnestine Mealing, RN Outcome: Progressing 03/12/2021 0305 by Earnestine Mealing, RN Outcome: Progressing   Problem: Activity: Goal: Risk for activity intolerance will decrease 03/12/2021 0305 by Earnestine Mealing, RN Outcome: Progressing 03/12/2021 0305 by Earnestine Mealing, RN Outcome: Progressing   Problem: Nutrition: Goal: Adequate nutrition will be maintained 03/12/2021 0305 by Earnestine Mealing, RN Outcome: Progressing 03/12/2021 0305 by Earnestine Mealing, RN Outcome: Progressing   Problem: Coping: Goal: Level of anxiety will  decrease 03/12/2021 0305 by Earnestine Mealing, RN Outcome: Progressing 03/12/2021 0305 by Earnestine Mealing, RN Outcome: Progressing   Problem: Elimination: Goal: Will not experience complications related to bowel motility 03/12/2021 0305 by Earnestine Mealing, RN Outcome: Progressing 03/12/2021 0305 by Earnestine Mealing, RN Outcome: Progressing Goal: Will not experience complications related to urinary retention 03/12/2021 0305 by Earnestine Mealing, RN Outcome: Progressing 03/12/2021 0305 by Earnestine Mealing, RN Outcome: Progressing   Problem: Pain Managment: Goal: General experience of comfort will improve 03/12/2021 0305 by Earnestine Mealing, RN Outcome: Progressing 03/12/2021 0305 by Earnestine Mealing, RN Outcome: Progressing   Problem: Safety: Goal: Ability to remain free from injury will improve 03/12/2021 0305 by Earnestine Mealing, RN Outcome: Progressing 03/12/2021 0305 by Earnestine Mealing, RN Outcome: Progressing   Problem: Skin Integrity: Goal: Risk for impaired skin integrity will decrease 03/12/2021 0305 by Earnestine Mealing, RN Outcome: Progressing 03/12/2021 0305 by Earnestine Mealing, RN Outcome: Progressing

## 2021-03-12 NOTE — Progress Notes (Signed)
Patient's mother at bedside. Discussed bruising with mother. She stated that she knew about all the bruising, except she did not know about the bruising on his inner right thigh until yesterday when she helped him get cleaned up in the emergency department.

## 2021-03-12 NOTE — TOC Initial Note (Signed)
Transition of Care [facility]) - Initial/Assessment Note    Patient Details  Name: Jared Mayer MRN: [id number] Date of Birth: 1970-05-24  Transition of Care [facility]) CM/SW Contact:    Hetty Ely, RN Phone Number: 03/12/2021, 4:30 PM  Clinical Narrative:  Patient lives at Group Home on 10/01 name changed to [facility], called spoke with Supervisor in [place], [employer] Myrla Halsted who reports medication error from new employee and wanted discharge information. Discussed the need for Home Health PT/OT, Amada Jupiter says that the new Management requires discharge orders written and they will arrange [facility]. Information given to RN and Attending.  RN reports concern with bruising noted on back, side and thigh. APS report made to [place] per documentation.                      Patient Goals and CMS Choice        Expected Discharge Plan and Services                                                Prior Living Arrangements/Services                       Activities of Daily Living Home Assistive Devices/Equipment: None ADL Screening (condition at time of admission) Patient's cognitive ability adequate to safely complete daily activities?: No Is the patient deaf or have difficulty hearing?: No Does the patient have difficulty seeing, even when wearing glasses/contacts?: No Does the patient have difficulty concentrating, remembering, or making decisions?: Yes Patient able to express need for assistance with ADLs?: No Does the patient have difficulty dressing or bathing?: Yes Independently performs ADLs?: No Communication: Independent Dressing (OT): Needs assistance Is this a change from baseline?: Change from baseline, expected to last <3days Grooming: Needs assistance Is this a change from baseline?: Change from baseline, expected to last <3 days Feeding: Independent Bathing: Needs assistance Is this a change from baseline?: Change from baseline, expected to last <3  days Toileting: Needs assistance Is this a change from baseline?: Change from baseline, expected to last <3 days In/Out Bed: Needs assistance Is this a change from baseline?: Change from baseline, expected to last <3 days Walks in Home: Independent Does the patient have difficulty walking or climbing stairs?: Yes Weakness of Legs: Both Weakness of Arms/Hands: Both  Permission Sought/Granted                  Emotional Assessment              Admission diagnosis:  Gait abnormality [R26.9] Drug overdose [T50.901A] Generalized weakness [R53.1] Patient Active Problem List   Diagnosis Date Noted   Altered mental status 03/11/2021   Schizoaffective disorder, bipolar type (HCC) 03/11/2021   Accidental overdose of lamotrigine 03/11/2021   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 [place] [address] Phone: [phone] Fax: [phone]     Social Determinants of Health (SDOH) Interventions    Readmission Risk Interventions No flowsheet data found.

## 2021-03-12 NOTE — Progress Notes (Signed)
PROGRESS NOTE    Jared Mayer  EZM:[id number] DOB: 09/08/1970 DOA: 03/11/2021 PCP: Pcp, No (Confirm with patient/family/NH records and if not entered, this HAS to be entered at [facility] point of entry. "No PCP" if truly none.)   Chief Complaint  Patient presents with   Weakness    Brief Narrative:  Jared Mayer is Jared Mayer 50 y.o. male with medical history significant of Schizoaffective disorder, h/o intellectual disability was found to have unsteady gait leading to possibly 4 falls per mother. Patient is Jared Mayer resident of local group home. He was brought into ED on 10/31 but D/Ced back. He had more falls and AMS so mother was notified who inquired with group home personal and was found that there was Jared Mayer recent pharmacy change with group home and patient's room mate Jared Mayer have been exchanged with him. Instead of him receiving 100 mg BID, he Mayer have received 300 mg in am 400 mg QHS over last 3 days so it's assumed that he likely received 700 mg more of Jared Mayer dose. Mother feels his mentation is somewhat improved than before but not quite back to baseline.    Assessment & Plan:   Principal Problem:   Accidental overdose of Jared Mayer Active Problems:   Altered mental status   Schizoaffective disorder, bipolar type (HCC)   Mild intellectual disability   Fall at home, initial encounter  Accidental Lamictal Overdose 700 mg over past 3 days (typically 100 mg BID) Lamictal level is 18.4 Currently on hold Continue IVF Appreciate psych recs - symptoms could be related to lamictal especially in setting of depakote - recommended 1-2 days of observation and holding lamictal at least 1 days - will touch base prior to resumption  Recurrent Falls Related to lamictal PT/OT, follow MRI brain and lumbar spine wnl Head CT with mild asymmetric atrophy of L cerebral cortex  Elevated CK  Mild Rhabdo Possibly related to lamictal overdose Continue IVF and trend CK  Intellectual  Disability  Schizoaffective disorder  AMS Improving, thought 2/2 lamictal overdose    DVT prophylaxis: lovenox Code Status: full  Family Communication: family over phone Disposition:   Status is: Observation  The patient will require care spanning > 2 midnights and should be moved to inpatient because: continued need for IVF in setting of elevated CK with accidental lamictal overdose      Consultants:  psychiatry  Procedures:  none  Antimicrobials:  Anti-infectives (From admission, onward)    None          Subjective: C/o pain  Objective: Vitals:   03/12/21 0429 03/12/21 0817 03/12/21 0900 03/12/21 1545  BP: 106/70 104/62  109/71  Pulse: 70 74  (!) 102  Resp: 18 18  18   Temp: 97.8 F (36.6 C) 98.7 F (37.1 C)  98.4 F (36.9 C)  TempSrc: Oral   Oral  SpO2: 100% 99%  100%  Weight:   85.2 kg   Height:   5\' 8"  (1.727 m)     Intake/Output Summary (Last 24 hours) at 03/12/2021 1845 Last data filed at 03/12/2021 1600 Gross per 24 hour  Intake 1931.36 ml  Output 200 ml  Net 1731.36 ml   Filed Weights   03/12/21 0900  Weight: 85.2 kg    Examination:  General exam: Appears calm and comfortable  Respiratory system: unlabored Cardiovascular system: RRR Gastrointestinal system: Abdomen is nondistended, soft and nontender Central nervous system: pleasantly confused, moving all extremities. Extremities: no LEE Skin: scattered bruising    Data Reviewed: I  have personally reviewed following labs and imaging studies  CBC: Recent Labs  Lab 03/10/21 1829 03/11/21 1444 03/12/21 0524  WBC 7.3 7.1 5.3  NEUTROABS 5.4 4.8  --   HGB 16.8 15.1 14.0  HCT 49.0 41.8 41.8  MCV 98.8 96.1 96.5  PLT 197 177 149*    Basic Metabolic Panel: Recent Labs  Lab 03/10/21 1829 03/11/21 1444 03/12/21 0524  NA 139 136 142  K 3.4* 3.1* 3.2*  CL 104 103 108  CO2 27 26 31   GLUCOSE 92 93 88  BUN 17 20 11   CREATININE 0.88 0.79 0.82  CALCIUM 8.8* 8.6* 8.2*     GFR: Estimated Creatinine Clearance: 114.5 mL/min (by C-G formula based on SCr of 0.82 mg/dL).  Liver Function Tests: Recent Labs  Lab 03/10/21 1829 03/11/21 1444  AST 81* 90*  ALT 25 26  ALKPHOS 66 48  BILITOT 1.1 0.8  PROT 7.2 5.8*  ALBUMIN 3.7 3.1*    CBG: No results for input(s): GLUCAP in the last 168 hours.   Recent Results (from the past 240 hour(s))  Blood culture (routine x 2)     Status: None (Preliminary result)   Collection Time: 03/10/21  6:29 PM   Specimen: BLOOD  Result Value Ref Range Status   Specimen Description BLOOD RIGHT ANTECUBITAL  Final   Special Requests   Final    BOTTLES DRAWN AEROBIC AND ANAEROBIC Blood Culture adequate volume   Culture   Final    NO GROWTH 2 DAYS Performed at [facility], [address], [place], [place] [address]    Report Status PENDING  Incomplete  Blood culture (routine x 2)     Status: None (Preliminary result)   Collection Time: 03/10/21  6:29 PM   Specimen: BLOOD  Result Value Ref Range Status   Specimen Description BLOOD BLOOD RIGHT ARM  Final   Special Requests   Final    BOTTLES DRAWN AEROBIC AND ANAEROBIC Blood Culture adequate volume   Culture   Final    NO GROWTH 2 DAYS Performed at [facility], [address] [place], [place] [address]    Report Status PENDING  Incomplete  Resp Panel by RT-PCR (Flu Jazminn Pomales&B, Covid) Urine, Clean Catch     Status: None   Collection Time: 03/10/21  6:44 PM   Specimen: Urine, Clean Catch; Nasopharyngeal(NP) swabs in vial transport medium  Result Value Ref Range Status   SARS Coronavirus 2 by RT PCR NEGATIVE NEGATIVE Final    Comment: (NOTE) SARS-CoV-2 target nucleic acids are NOT DETECTED.  The SARS-CoV-2 RNA is generally detectable in upper respiratory specimens during the acute phase of infection. The lowest concentration of SARS-CoV-2 viral copies this assay can detect is 138 copies/mL. Zedekiah Hinderman negative result does not preclude SARS-Cov-2 infection  and should not be used as the sole basis for treatment or other patient management decisions. Soni Kegel negative result may occur with  improper specimen collection/handling, submission of specimen other than nasopharyngeal swab, presence of viral mutation(s) within the areas targeted by this assay, and inadequate number of viral copies(<138 copies/mL). Kieran Nachtigal negative result must be combined with clinical observations, patient history, and epidemiological information. The expected result is Negative.  Fact Sheet for Patients:  BloggerCourse.com  Fact Sheet for Healthcare Providers:  SeriousBroker.it  This test is no t yet approved or cleared by the [place] FDA and  has been authorized for detection and/or diagnosis of SARS-CoV-2 by FDA under an Emergency Use Authorization (EUA). This EUA  will remain  in effect (meaning this test can be used) for the duration of the COVID-19 declaration under Section 564(b)(1) of the Act, 21 U.S.C.section 360bbb-3(b)(1), unless the authorization is terminated  or revoked sooner.       Influenza Gloria Lambertson by PCR NEGATIVE NEGATIVE Final   Influenza B by PCR NEGATIVE NEGATIVE Final    Comment: (NOTE) The Xpert Xpress SARS-CoV-2/FLU/RSV plus assay is intended as an aid in the diagnosis of influenza from Nasopharyngeal swab specimens and should not be used as Bryli Mantey sole basis for treatment. Nasal washings and aspirates are unacceptable for Xpert Xpress SARS-CoV-2/FLU/RSV testing.  Fact Sheet for Patients: BloggerCourse.com  Fact Sheet for Healthcare Providers: SeriousBroker.it  This test is not yet approved or cleared by the [place] FDA and has been authorized for detection and/or diagnosis of SARS-CoV-2 by FDA under an Emergency Use Authorization (EUA). This EUA will remain in effect (meaning this test can be used) for the duration of the COVID-19 declaration  under Section 564(b)(1) of the Act, 21 U.S.C. section 360bbb-3(b)(1), unless the authorization is terminated or revoked.  Performed at [facility], [address], [place], [place] [address]   Urine Culture     Status: None   Collection Time: 03/10/21  6:51 PM   Specimen: Urine, Random  Result Value Ref Range Status   Specimen Description   Final    URINE, RANDOM Performed at [facility], [address], [place], [place] [address]    Special Requests   Final    NONE Performed at [facility], [address], [place], [place] [address]    Culture   Final    NO GROWTH Performed at [facility], [address] [place], [place] [address]    Report Status 03/11/2021 FINAL  Final  Resp Panel by RT-PCR (Flu Wetona Viramontes&B, Covid) Nasopharyngeal Swab     Status: None   Collection Time: 03/11/21  2:44 PM   Specimen: Nasopharyngeal Swab; Nasopharyngeal(NP) swabs in vial transport medium  Result Value Ref Range Status   SARS Coronavirus 2 by RT PCR NEGATIVE NEGATIVE Final    Comment: (NOTE) SARS-CoV-2 target nucleic acids are NOT DETECTED.  The SARS-CoV-2 RNA is generally detectable in upper respiratory specimens during the acute phase of infection. The lowest concentration of SARS-CoV-2 viral copies this assay can detect is 138 copies/mL. Veronda Gabor negative result does not preclude SARS-Cov-2 infection and should not be used as the sole basis for treatment or other patient management decisions. Nickole Adamek negative result may occur with  improper specimen collection/handling, submission of specimen other than nasopharyngeal swab, presence of viral mutation(s) within the areas targeted by this assay, and inadequate number of viral copies(<138 copies/mL). Cherith Tewell negative result must be combined with clinical observations, patient history, and epidemiological information. The expected result is Negative.  Fact Sheet for Patients:   BloggerCourse.com  Fact Sheet for Healthcare Providers:  SeriousBroker.it  This test is no t yet approved or cleared by the [place] FDA and  has been authorized for detection and/or diagnosis of SARS-CoV-2 by FDA under an Emergency Use Authorization (EUA). This EUA will remain  in effect (meaning this test can be used) for the duration of the COVID-19 declaration under Section 564(b)(1) of the Act, 21 U.S.C.section 360bbb-3(b)(1), unless the authorization is terminated  or revoked sooner.       Influenza Jair Lindblad by PCR NEGATIVE NEGATIVE Final   Influenza B by PCR NEGATIVE NEGATIVE Final    Comment: (NOTE) The  Xpert Xpress SARS-CoV-2/FLU/RSV plus assay is intended as an aid in the diagnosis of influenza from Nasopharyngeal swab specimens and should not be used as Rad Gramling sole basis for treatment. Nasal washings and aspirates are unacceptable for Xpert Xpress SARS-CoV-2/FLU/RSV testing.  Fact Sheet for Patients: BloggerCourse.com  Fact Sheet for Healthcare Providers: SeriousBroker.it  This test is not yet approved or cleared by the [place] FDA and has been authorized for detection and/or diagnosis of SARS-CoV-2 by FDA under an Emergency Use Authorization (EUA). This EUA will remain in effect (meaning this test can be used) for the duration of the COVID-19 declaration under Section 564(b)(1) of the Act, 21 U.S.C. section 360bbb-3(b)(1), unless the authorization is terminated or revoked.  Performed at [facility], [address] [place], [place] [address]          Radiology Studies: DG Chest 2 View  Result Date: 03/10/2021 CLINICAL DATA:  Altered mental status. EXAM: CHEST - 2 VIEW COMPARISON:  Chest radiograph dated 03/29/2018 FINDINGS: There is mild eventration of the right hemidiaphragm. Left lung base atelectasis. No focal consolidation, pleural effusion,  or pneumothorax. The cardiac silhouette is within normal limits. No acute osseous pathology. IMPRESSION: No active cardiopulmonary disease. Electronically Signed   By: Elgie Collard M.D.   On: 03/10/2021 19:19   CT Head Wo Contrast  Result Date: 03/10/2021 CLINICAL DATA:  Concern for intracranial hemorrhage. EXAM: CT HEAD WITHOUT CONTRAST TECHNIQUE: Contiguous axial images were obtained from the base of the skull through the vertex without intravenous contrast. COMPARISON:  Head CT report dated 12/25/1999. FINDINGS: Brain: Mild asymmetric atrophy of the left cerebral cortex. The ventricles are appropriate size for patient's age. The gray-white matter discrimination is preserved. There is no acute intracranial hemorrhage. No mass effect or midline shift. No extra-axial fluid collection. Vascular: No hyperdense vessel or unexpected calcification. Skull: Normal. Negative for fracture or focal lesion. Sinuses/Orbits: No acute finding. Other: None IMPRESSION: 1. No acute intracranial pathology. 2. Mild asymmetric atrophy of the left cerebral cortex. Electronically Signed   By: Elgie Collard M.D.   On: 03/10/2021 19:27   MR BRAIN WO CONTRAST  Result Date: 03/10/2021 CLINICAL DATA:  Acute neurologic deficit EXAM: MRI HEAD WITHOUT CONTRAST TECHNIQUE: Multiplanar, multiecho pulse sequences of the brain and surrounding structures were obtained without intravenous contrast. COMPARISON:  None. FINDINGS: Brain: No acute infarct, mass effect or extra-axial collection. No acute or chronic hemorrhage. Normal white matter signal, parenchymal volume and CSF spaces. The midline structures are normal. Vascular: Major flow voids are preserved. Skull and upper cervical spine: Normal calvarium and skull base. Visualized upper cervical spine and soft tissues are normal. Sinuses/Orbits:No paranasal sinus fluid levels or advanced mucosal thickening. No mastoid or middle ear effusion. Normal orbits. IMPRESSION: Normal brain  MRI. Electronically Signed   By: Deatra Robinson M.D.   On: 03/10/2021 22:50   MR LUMBAR SPINE WO CONTRAST  Result Date: 03/10/2021 CLINICAL DATA:  Inability to stand or wall EXAM: MRI LUMBAR SPINE WITHOUT CONTRAST TECHNIQUE: Multiplanar, multisequence MR imaging of the lumbar spine was performed. No intravenous contrast was administered. COMPARISON:  None. FINDINGS: Segmentation:  Standard Alignment:  Normal Vertebrae:  No fracture, evidence of discitis, or bone lesion. Conus medullaris and cauda equina: Conus extends to the L1 level. Conus and cauda equina appear normal. Paraspinal and other soft tissues: Negative. Disc levels: L1-L2: Normal disc space and facet joints. No spinal canal stenosis. No neural foraminal stenosis. L2-L3: Small central annular fissure without herniation. No spinal canal  stenosis. No neural foraminal stenosis. L3-L4: Normal disc space and facet joints. No spinal canal stenosis. No neural foraminal stenosis. L4-L5: Small disc bulge. No spinal canal stenosis. No neural foraminal stenosis. L5-S1: Normal disc space and facet joints. No spinal canal stenosis. No neural foraminal stenosis. Visualized sacrum: Normal. IMPRESSION: 1. No acute abnormality of the lumbar spine. 2. Mild lumbar degenerative disc disease without spinal canal or neural foraminal stenosis. Electronically Signed   By: Deatra Robinson M.D.   On: 03/10/2021 22:52        Scheduled Meds:  buPROPion  300 mg Oral Daily   chlorhexidine  7.5 mL Mouth Rinse BID   cholecalciferol  1,000 Units Oral Daily   divalproex  2,000 mg Oral QHS   divalproex  500 mg Oral Daily   docusate sodium  100 mg Oral BID   enoxaparin (LOVENOX) injection  40 mg Subcutaneous Q24H   glycopyrrolate  1 mg Oral BID   hydrOXYzine  25 mg Oral BID   levothyroxine  112 mcg Oral Q0600   linaclotide  290 mcg Oral Daily   loratadine  10 mg Oral Daily   [START ON 03/13/2021] LORazepam  0.5 mg Oral Daily   LORazepam  1 mg Oral QHS   OLANZapine  30  mg Oral QHS   omega-3 acid ethyl esters  1,000 mg Oral BID   pantoprazole  40 mg Oral Daily   polyethylene glycol  34 g Oral Daily   sucralfate  1 g Oral BID   topiramate  75 mg Oral BID   valbenazine  80 mg Oral QHS   Continuous Infusions:  sodium chloride 75 mL/hr at 03/12/21 0641     LOS: 0 days    Time spent: over 30 min    Lacretia Nicks, MD [facility]   To contact the attending provider between 7A-7P or the covering provider during after hours 7P-7A, please log into the web site [email or web address] and access using universal [place] password for that web site. If you do not have the password, please call the hospital operator.  03/12/2021, 6:45 PM

## 2021-03-12 NOTE — Progress Notes (Signed)
Jared Mayer from patient's group home called inquiring if his lamictal results were back yet. This RN asked her if she knew about the bruising on patient's right thigh. Jared Mayer stated that she did know about the bruising and asked if this RN saw the bruising to patient's bilateral knees and back. RN asked Jared Mayer what happened and Jared Mayer stated "patient fell in the kitchen directly on the chair."  Jared Mayer stated "we have asked him several times how he got the marks on his thigh and he tells [place] 'I dont know.' He gets them all the time.They must be self inflicted marks." Jared Mayer stated patient's mother was aware of the bruising as well. Will follow up with patient's mother.

## 2021-03-13 DIAGNOSIS — Z79899 Other long term (current) drug therapy: Secondary | ICD-10-CM | POA: Diagnosis not present

## 2021-03-13 DIAGNOSIS — Z20822 Contact with and (suspected) exposure to covid-19: Secondary | ICD-10-CM | POA: Diagnosis present

## 2021-03-13 DIAGNOSIS — R296 Repeated falls: Secondary | ICD-10-CM | POA: Diagnosis present

## 2021-03-13 DIAGNOSIS — Z79890 Hormone replacement therapy (postmenopausal): Secondary | ICD-10-CM | POA: Diagnosis not present

## 2021-03-13 DIAGNOSIS — T426X1A Poisoning by other antiepileptic and sedative-hypnotic drugs, accidental (unintentional), initial encounter: Secondary | ICD-10-CM | POA: Diagnosis present

## 2021-03-13 DIAGNOSIS — R251 Tremor, unspecified: Secondary | ICD-10-CM | POA: Diagnosis present

## 2021-03-13 DIAGNOSIS — F70 Mild intellectual disabilities: Secondary | ICD-10-CM | POA: Diagnosis present

## 2021-03-13 DIAGNOSIS — F250 Schizoaffective disorder, bipolar type: Secondary | ICD-10-CM | POA: Diagnosis present

## 2021-03-13 DIAGNOSIS — R519 Headache, unspecified: Secondary | ICD-10-CM | POA: Diagnosis present

## 2021-03-13 DIAGNOSIS — R531 Weakness: Secondary | ICD-10-CM | POA: Diagnosis present

## 2021-03-13 DIAGNOSIS — Z9181 History of falling: Secondary | ICD-10-CM | POA: Diagnosis not present

## 2021-03-13 DIAGNOSIS — R269 Unspecified abnormalities of gait and mobility: Secondary | ICD-10-CM | POA: Diagnosis present

## 2021-03-13 DIAGNOSIS — E039 Hypothyroidism, unspecified: Secondary | ICD-10-CM | POA: Diagnosis present

## 2021-03-13 DIAGNOSIS — R2681 Unsteadiness on feet: Secondary | ICD-10-CM | POA: Diagnosis present

## 2021-03-13 DIAGNOSIS — M6282 Rhabdomyolysis: Secondary | ICD-10-CM | POA: Diagnosis present

## 2021-03-13 DIAGNOSIS — T50901A Poisoning by unspecified drugs, medicaments and biological substances, accidental (unintentional), initial encounter: Secondary | ICD-10-CM | POA: Diagnosis present

## 2021-03-13 DIAGNOSIS — E785 Hyperlipidemia, unspecified: Secondary | ICD-10-CM | POA: Diagnosis present

## 2021-03-13 DIAGNOSIS — F05 Delirium due to known physiological condition: Secondary | ICD-10-CM | POA: Diagnosis not present

## 2021-03-13 DIAGNOSIS — R300 Dysuria: Secondary | ICD-10-CM | POA: Diagnosis present

## 2021-03-13 LAB — COMPREHENSIVE METABOLIC PANEL
ALT: 24 U/L (ref 0–44)
AST: 55 U/L — ABNORMAL HIGH (ref 15–41)
Albumin: 2.3 g/dL — ABNORMAL LOW (ref 3.5–5.0)
Alkaline Phosphatase: 43 U/L (ref 38–126)
Anion gap: 5 (ref 5–15)
BUN: 7 mg/dL (ref 6–20)
CO2: 23 mmol/L (ref 22–32)
Calcium: 7.4 mg/dL — ABNORMAL LOW (ref 8.9–10.3)
Chloride: 112 mmol/L — ABNORMAL HIGH (ref 98–111)
Creatinine, Ser: 0.6 mg/dL — ABNORMAL LOW (ref 0.61–1.24)
GFR, Estimated: >60 mL/min (ref >60)
Glucose, Bld: 81 mg/dL (ref 70–99)
Potassium: 3.6 mmol/L (ref 3.5–5.1)
Sodium: 140 mmol/L (ref 135–145)
Total Bilirubin: 0.8 mg/dL (ref 0.3–1.2)
Total Protein: 4.7 g/dL — ABNORMAL LOW (ref 6.5–8.1)

## 2021-03-13 LAB — CBC WITH DIFFERENTIAL/PLATELET
Abs Immature Granulocytes: 0.02 10*3/uL (ref 0.00–0.07)
Basophils Absolute: 0 10*3/uL (ref 0.0–0.1)
Basophils Relative: 0 %
Eosinophils Absolute: 0 10*3/uL (ref 0.0–0.5)
Eosinophils Relative: 0 %
HCT: 35.2 % — ABNORMAL LOW (ref 39.0–52.0)
Hemoglobin: 11.9 g/dL — ABNORMAL LOW (ref 13.0–17.0)
Immature Granulocytes: 0 %
Lymphocytes Relative: 30 %
Lymphs Abs: 1.6 10*3/uL (ref 0.7–4.0)
MCH: 33.9 pg (ref 26.0–34.0)
MCHC: 33.8 g/dL (ref 30.0–36.0)
MCV: 100.3 fL — ABNORMAL HIGH (ref 80.0–100.0)
Monocytes Absolute: 0.6 10*3/uL (ref 0.1–1.0)
Monocytes Relative: 11 %
Neutro Abs: 3.1 10*3/uL (ref 1.7–7.7)
Neutrophils Relative %: 59 %
Platelets: 132 10*3/uL — ABNORMAL LOW (ref 150–400)
RBC: 3.51 MIL/uL — ABNORMAL LOW (ref 4.22–5.81)
RDW: 14.1 % (ref 11.5–15.5)
WBC: 5.3 10*3/uL (ref 4.0–10.5)
nRBC: 0 % (ref 0.0–0.2)

## 2021-03-13 LAB — CK: Total CK: 924 U/L — ABNORMAL HIGH (ref 49–397)

## 2021-03-13 LAB — MAGNESIUM: Magnesium: 1.8 mg/dL (ref 1.7–2.4)

## 2021-03-13 LAB — PHOSPHORUS: Phosphorus: 2.9 mg/dL (ref 2.5–4.6)

## 2021-03-13 MED ORDER — LAMOTRIGINE 100 MG PO TABS
100.0000 mg | ORAL_TABLET | Freq: Every day | ORAL | Status: DC
  Administered 2021-03-13: 100 mg via ORAL
  Filled 2021-03-13: qty 1

## 2021-03-13 MED ORDER — ATORVASTATIN CALCIUM 40 MG PO TABS
40.0000 mg | ORAL_TABLET | Freq: Every day | ORAL | Status: DC
Start: 2021-03-17 — End: 2023-05-15

## 2021-03-13 NOTE — Discharge Summary (Addendum)
Physician Discharge Summary  Jared Mayer ZOX:[id number] DOB: 06/14/1970 DOA: 03/11/2021  PCP: Pcp, No  Admit date: 03/11/2021 Discharge date: 03/13/2021  Time spent: 40 minutes  Recommendations for Outpatient Follow-up:  Follow outpatient CBC/CMP/CK within Jared Mayer week or so Follow with outpatient prescriber for psych meds  Follow APS report for bruising (? Related to falls)  Discharge Diagnoses:  Principal Problem:   Accidental overdose of lamotrigine Active Problems:   Altered mental status   Schizoaffective disorder, bipolar type (HCC)   Mild intellectual disability   Fall at home, initial encounter   Accidental overdose   Discharge Condition: stable  Diet recommendation: heart healthy  Filed Weights   03/12/21 0900  Weight: 85.2 kg    History of present illness:  Jared Mayer is Jared Mayer 50 y.o. male with medical history significant of Schizoaffective disorder, h/o intellectual disability was found to have unsteady gait leading to possibly 4 falls per mother. Patient is Jared Mayer resident of local group home. He was brought into ED on 10/31 but D/Ced back. He had more falls and AMS so mother was notified who inquired with group home personal and was found that there was Jared Mayer recent pharmacy change with group home and patient's room mate Lamotrigine might have been exchanged with him. Instead of him receiving 100 mg BID, he might have received 300 mg in am 400 mg QHS over last 3 days so it's assumed that he likely received 700 mg more of Lamotrigine dose. Mother feels his mentation is somewhat improved than before but not quite back to baseline.   He's gradually improved after holding lamictal.  Plan for discharge to group home with home health on prior to admission meds.  Hospital Course:  AMS Improving, thought 2/2 lamictal overdose - also, likely component of hospital delirium - was pretty sleepy, lethargic this AM, but this has now improved.  TSH wnl.  I think VBG, B12, folate can be deferred at  this time. Delirium prec.   Accidental Lamictal Overdose 700 mg over past 3 days (typically 100 mg daily) Lamictal level is 18.4 -> wnl Resumed per psych today Appreciate psych recs - symptoms could be related to lamictal especially in setting of depakote - recommended 1-2 days of observation and holding lamictal at least 1 days Follow with outpatient prescriber   Recurrent Falls Related to lamictal PT/OT, follow MRI brain and lumbar spine wnl Head CT with mild asymmetric atrophy of L cerebral cortex   Elevated CK  Mild Rhabdo Possibly related to lamictal overdose Continue IVF and trend CK Hold statin until next week.   Bruising Noted to back and inner thigh Inner thigh appears almost like hand print?  Given concern, APS report made. Discussed with mother who thinks likely related to falls, possibility self induced? When pt questioned, he's not able to answer consistently  - occasionally says his brother did it, other times not clear.  Follow with APS outpatient.   Intellectual Disability   Schizoaffective disorder  Procedures: none  Consultations: psych  Discharge Exam: Vitals:   03/13/21 0349 03/13/21 0804  BP: 103/66 103/67  Pulse: 73 64  Resp: 18 19  Temp: 99.1 F (37.3 C) 98.1 F (36.7 C)  SpO2: 98% 97%   Very sleepy this morning Improved this afternoon General: No acute distress. Cardiovascular: RRR Lungs: unlabored Abdomen: Soft, nontender, nondistended  Neurological: sleepy, moving all extremities Skin: Warm and dry. No rashes or lesions. Extremities: No clubbing or cyanosis. No edema.   Discharge Instructions   Discharge  Instructions     Call MD for:  difficulty breathing, headache or visual disturbances   Complete by: As directed    Call MD for:  extreme fatigue   Complete by: As directed    Call MD for:  hives   Complete by: As directed    Call MD for:  persistant dizziness or light-headedness   Complete by: As directed    Call MD  for:  persistant nausea and vomiting   Complete by: As directed    Call MD for:  redness, tenderness, or signs of infection (pain, swelling, redness, odor or green/yellow discharge around incision site)   Complete by: As directed    Call MD for:  severe uncontrolled pain   Complete by: As directed    Call MD for:  temperature >100.4   Complete by: As directed    Diet - low sodium heart healthy   Complete by: As directed    Discharge instructions   Complete by: As directed    You were seen for Jared Mayer lamictal overdose.   You've improved.  Continue your prior to admission medicines and follow up with your primary prescriber as an outpatient.  Wait until Monday to resume your lipitor.  Ask to follow up Jared Mayer repeat CK, CBC, and CMP within Jared Mayer week or so.  We'll set you up with home heath therapy.  Return for new, recurrent, or worsening symptoms.  Please ask your PCP to request records from this hospitalization so they know what was done and what the next steps will be.   Increase activity slowly   Complete by: As directed       Allergies as of 03/13/2021   No Known Allergies      Medication List     TAKE these medications    atorvastatin 40 MG tablet Commonly known as: LIPITOR Take 1 tablet (40 mg total) by mouth daily. Start taking on: March 17, 2021 What changed: These instructions start on March 17, 2021. If you are unsure what to do until then, ask your doctor or other care provider.   buPROPion 150 MG 24 hr tablet Commonly known as: WELLBUTRIN XL Take 300 mg by mouth daily.   chlorhexidine 0.12 % solution Commonly known as: PERIDEX 7.5 mLs by Mouth Rinse route 2 (two) times daily. After brushing teeth. Do not swallow or rinse with water or mouthwash after.   cholecalciferol 25 MCG (1000 UNIT) tablet Commonly known as: VITAMIN D3 Take 1,000 Units by mouth daily.   divalproex 500 MG 24 hr tablet Commonly known as: DEPAKOTE ER Take 500-2,000 mg by mouth See admin  instructions. 500 mg every morning and 2000 mg at bedtime   docusate sodium 100 MG capsule Commonly known as: COLACE Take 100 mg by mouth 2 (two) times daily.   fexofenadine 180 MG tablet Commonly known as: ALLEGRA Take 180 mg by mouth daily as needed for allergies.   Fish Oil 1000 MG Caps Take 1,000 mg by mouth 2 (two) times daily.   fluticasone 50 MCG/ACT nasal spray Commonly known as: FLONASE Place 2 sprays into the nose daily as needed for allergies or rhinitis.   glycopyrrolate 1 MG tablet Commonly known as: ROBINUL Take 1 mg by mouth 2 (two) times daily.   hydrOXYzine 25 MG tablet Commonly known as: ATARAX/VISTARIL Take 25 mg by mouth 2 (two) times daily.   Ingrezza 80 MG capsule Generic drug: valbenazine Take 80 mg by mouth at bedtime.   lamoTRIgine 100 MG tablet  Commonly known as: LAMICTAL Take 100 mg by mouth daily.   levothyroxine 112 MCG tablet Commonly known as: SYNTHROID Take 112 mcg by mouth daily. On an empty stomach with Teryn Gust glass of water 30-60 minutes before breakfast   linaclotide 290 MCG Caps capsule Commonly known as: LINZESS Take 290 mcg by mouth daily.   LORazepam 0.5 MG tablet Commonly known as: ATIVAN Take 0.5-1 mg by mouth 2 (two) times daily. Take 1 tablet (0.5 mg) each morning and 2 tablets (1 mg) at bedtime   OLANZapine 15 MG tablet Commonly known as: ZYPREXA Take 30 mg by mouth at bedtime.   omeprazole 20 MG capsule Commonly known as: PRILOSEC Take 40 mg by mouth daily.   polyethylene glycol powder 17 GM/SCOOP powder Commonly known as: GLYCOLAX/MIRALAX Take 34 g by mouth daily. Mix with 8 ounces of water   simethicone 80 MG chewable tablet Commonly known as: MYLICON Chew 80 mg by mouth every 6 (six) hours as needed for flatulence.   sodium fluoride 1.1 % Crea dental cream Commonly known as: PREVIDENT 5000 PLUS Place 1 application onto teeth 2 (two) times daily.   sucralfate 1 g tablet Commonly known as: CARAFATE Take 1 g  by mouth 2 (two) times daily. Before meals   Systane Complete 0.6 % Soln Generic drug: Propylene Glycol Place 1 drop into both eyes 3 (three) times daily as needed for dry eyes.   topiramate 25 MG tablet Commonly known as: TOPAMAX Take 75 mg by mouth 2 (two) times daily.   triamcinolone cream 0.1 % Commonly known as: KENALOG Apply 1 application topically 2 (two) times daily as needed (for inflammation/irritation).       No Known Allergies    The results of significant diagnostics from this hospitalization (including imaging, microbiology, ancillary and laboratory) are listed below for reference.    Significant Diagnostic Studies: DG Chest 2 View  Result Date: 03/10/2021 CLINICAL DATA:  Altered mental status. EXAM: CHEST - 2 VIEW COMPARISON:  Chest radiograph dated 03/29/2018 FINDINGS: There is mild eventration of the right hemidiaphragm. Left lung base atelectasis. No focal consolidation, pleural effusion, or pneumothorax. The cardiac silhouette is within normal limits. No acute osseous pathology. IMPRESSION: No active cardiopulmonary disease. Electronically Signed   By: Elgie Collard M.D.   On: 03/10/2021 19:19   CT Head Wo Contrast  Result Date: 03/10/2021 CLINICAL DATA:  Concern for intracranial hemorrhage. EXAM: CT HEAD WITHOUT CONTRAST TECHNIQUE: Contiguous axial images were obtained from the base of the skull through the vertex without intravenous contrast. COMPARISON:  Head CT report dated 12/25/1999. FINDINGS: Brain: Mild asymmetric atrophy of the left cerebral cortex. The ventricles are appropriate size for patient's age. The gray-white matter discrimination is preserved. There is no acute intracranial hemorrhage. No mass effect or midline shift. No extra-axial fluid collection. Vascular: No hyperdense vessel or unexpected calcification. Skull: Normal. Negative for fracture or focal lesion. Sinuses/Orbits: No acute finding. Other: None IMPRESSION: 1. No acute intracranial  pathology. 2. Mild asymmetric atrophy of the left cerebral cortex. Electronically Signed   By: Elgie Collard M.D.   On: 03/10/2021 19:27   MR BRAIN WO CONTRAST  Result Date: 03/10/2021 CLINICAL DATA:  Acute neurologic deficit EXAM: MRI HEAD WITHOUT CONTRAST TECHNIQUE: Multiplanar, multiecho pulse sequences of the brain and surrounding structures were obtained without intravenous contrast. COMPARISON:  None. FINDINGS: Brain: No acute infarct, mass effect or extra-axial collection. No acute or chronic hemorrhage. Normal white matter signal, parenchymal volume and CSF spaces. The midline structures  are normal. Vascular: Major flow voids are preserved. Skull and upper cervical spine: Normal calvarium and skull base. Visualized upper cervical spine and soft tissues are normal. Sinuses/Orbits:No paranasal sinus fluid levels or advanced mucosal thickening. No mastoid or middle ear effusion. Normal orbits. IMPRESSION: Normal brain MRI. Electronically Signed   By: Deatra Robinson M.D.   On: 03/10/2021 22:50   MR LUMBAR SPINE WO CONTRAST  Result Date: 03/10/2021 CLINICAL DATA:  Inability to stand or wall EXAM: MRI LUMBAR SPINE WITHOUT CONTRAST TECHNIQUE: Multiplanar, multisequence MR imaging of the lumbar spine was performed. No intravenous contrast was administered. COMPARISON:  None. FINDINGS: Segmentation:  Standard Alignment:  Normal Vertebrae:  No fracture, evidence of discitis, or bone lesion. Conus medullaris and cauda equina: Conus extends to the L1 level. Conus and cauda equina appear normal. Paraspinal and other soft tissues: Negative. Disc levels: L1-L2: Normal disc space and facet joints. No spinal canal stenosis. No neural foraminal stenosis. L2-L3: Small central annular fissure without herniation. No spinal canal stenosis. No neural foraminal stenosis. L3-L4: Normal disc space and facet joints. No spinal canal stenosis. No neural foraminal stenosis. L4-L5: Small disc bulge. No spinal canal stenosis.  No neural foraminal stenosis. L5-S1: Normal disc space and facet joints. No spinal canal stenosis. No neural foraminal stenosis. Visualized sacrum: Normal. IMPRESSION: 1. No acute abnormality of the lumbar spine. 2. Mild lumbar degenerative disc disease without spinal canal or neural foraminal stenosis. Electronically Signed   By: Deatra Robinson M.D.   On: 03/10/2021 22:52    Microbiology: Recent Results (from the past 240 hour(s))  Blood culture (routine x 2)     Status: None (Preliminary result)   Collection Time: 03/10/21  6:29 PM   Specimen: BLOOD  Result Value Ref Range Status   Specimen Description BLOOD RIGHT ANTECUBITAL  Final   Special Requests   Final    BOTTLES DRAWN AEROBIC AND ANAEROBIC Blood Culture adequate volume   Culture   Final    NO GROWTH 3 DAYS Performed at [facility], [address], [place], [place] [address]    Report Status PENDING  Incomplete  Blood culture (routine x 2)     Status: None (Preliminary result)   Collection Time: 03/10/21  6:29 PM   Specimen: BLOOD  Result Value Ref Range Status   Specimen Description BLOOD BLOOD RIGHT ARM  Final   Special Requests   Final    BOTTLES DRAWN AEROBIC AND ANAEROBIC Blood Culture adequate volume   Culture   Final    NO GROWTH 3 DAYS Performed at [facility], [address], [place], [place] [address]    Report Status PENDING  Incomplete  Resp Panel by RT-PCR (Flu Tyreisha Ungar&B, Covid) Urine, Clean Catch     Status: None   Collection Time: 03/10/21  6:44 PM   Specimen: Urine, Clean Catch; Nasopharyngeal(NP) swabs in vial transport medium  Result Value Ref Range Status   SARS Coronavirus 2 by RT PCR NEGATIVE NEGATIVE Final    Comment: (NOTE) SARS-CoV-2 target nucleic acids are NOT DETECTED.  The SARS-CoV-2 RNA is generally detectable in upper respiratory specimens during the acute phase of infection. The lowest concentration of SARS-CoV-2 viral copies this assay can detect is 138 copies/mL. Keneisha Heckart  negative result does not preclude SARS-Cov-2 infection and should not be used as the sole basis for treatment or other patient management decisions. Zonia Caplin negative result may occur with  improper specimen collection/handling, submission of specimen other than nasopharyngeal swab, presence of viral  mutation(s) within the areas targeted by this assay, and inadequate number of viral copies(<138 copies/mL). Brycin Kille negative result must be combined with clinical observations, patient history, and epidemiological information. The expected result is Negative.  Fact Sheet for Patients:  BloggerCourse.com  Fact Sheet for Healthcare Providers:  SeriousBroker.it  This test is no t yet approved or cleared by the [place] FDA and  has been authorized for detection and/or diagnosis of SARS-CoV-2 by FDA under an Emergency Use Authorization (EUA). This EUA will remain  in effect (meaning this test can be used) for the duration of the COVID-19 declaration under Section 564(b)(1) of the Act, 21 U.S.C.section 360bbb-3(b)(1), unless the authorization is terminated  or revoked sooner.       Influenza Nikie Cid by PCR NEGATIVE NEGATIVE Final   Influenza B by PCR NEGATIVE NEGATIVE Final    Comment: (NOTE) The Xpert Xpress SARS-CoV-2/FLU/RSV plus assay is intended as an aid in the diagnosis of influenza from Nasopharyngeal swab specimens and should not be used as Chaquita Basques sole basis for treatment. Nasal washings and aspirates are unacceptable for Xpert Xpress SARS-CoV-2/FLU/RSV testing.  Fact Sheet for Patients: BloggerCourse.com  Fact Sheet for Healthcare Providers: SeriousBroker.it  This test is not yet approved or cleared by the [place] FDA and has been authorized for detection and/or diagnosis of SARS-CoV-2 by FDA under an Emergency Use Authorization (EUA). This EUA will remain in effect (meaning this test can  be used) for the duration of the COVID-19 declaration under Section 564(b)(1) of the Act, 21 U.S.C. section 360bbb-3(b)(1), unless the authorization is terminated or revoked.  Performed at [facility], [address] [place], [place] [address]   Urine Culture     Status: None   Collection Time: 03/10/21  6:51 PM   Specimen: Urine, Random  Result Value Ref Range Status   Specimen Description   Final    URINE, RANDOM Performed at [facility], [address] [place], [place] [address]    Special Requests   Final    NONE Performed at [facility], [address], [place], [place] [address]    Culture   Final    NO GROWTH Performed at [facility], [address], [place], [place] [address]    Report Status 03/11/2021 FINAL  Final  Resp Panel by RT-PCR (Flu Kianah Harries&B, Covid) Nasopharyngeal Swab     Status: None   Collection Time: 03/11/21  2:44 PM   Specimen: Nasopharyngeal Swab; Nasopharyngeal(NP) swabs in vial transport medium  Result Value Ref Range Status   SARS Coronavirus 2 by RT PCR NEGATIVE NEGATIVE Final    Comment: (NOTE) SARS-CoV-2 target nucleic acids are NOT DETECTED.  The SARS-CoV-2 RNA is generally detectable in upper respiratory specimens during the acute phase of infection. The lowest concentration of SARS-CoV-2 viral copies this assay can detect is 138 copies/mL. Farra Nikolic negative result does not preclude SARS-Cov-2 infection and should not be used as the sole basis for treatment or other patient management decisions. Sevin Langenbach negative result may occur with  improper specimen collection/handling, submission of specimen other than nasopharyngeal swab, presence of viral mutation(s) within the areas targeted by this assay, and inadequate number of viral copies(<138 copies/mL). Dianna Ewald negative result must be combined with clinical observations, patient history, and epidemiological information. The expected result is Negative.  Fact Sheet for  Patients:  BloggerCourse.com  Fact Sheet for Healthcare Providers:  SeriousBroker.it  This test is no t yet approved or cleared by the [place] FDA  and  has been authorized for detection and/or diagnosis of SARS-CoV-2 by FDA under an Emergency Use Authorization (EUA). This EUA will remain  in effect (meaning this test can be used) for the duration of the COVID-19 declaration under Section 564(b)(1) of the Act, 21 U.S.C.section 360bbb-3(b)(1), unless the authorization is terminated  or revoked sooner.       Influenza Kosisochukwu Burningham by PCR NEGATIVE NEGATIVE Final   Influenza B by PCR NEGATIVE NEGATIVE Final    Comment: (NOTE) The Xpert Xpress SARS-CoV-2/FLU/RSV plus assay is intended as an aid in the diagnosis of influenza from Nasopharyngeal swab specimens and should not be used as Shamel Germond sole basis for treatment. Nasal washings and aspirates are unacceptable for Xpert Xpress SARS-CoV-2/FLU/RSV testing.  Fact Sheet for Patients: BloggerCourse.com  Fact Sheet for Healthcare Providers: SeriousBroker.it  This test is not yet approved or cleared by the [place] FDA and has been authorized for detection and/or diagnosis of SARS-CoV-2 by FDA under an Emergency Use Authorization (EUA). This EUA will remain in effect (meaning this test can be used) for the duration of the COVID-19 declaration under Section 564(b)(1) of the Act, 21 U.S.C. section 360bbb-3(b)(1), unless the authorization is terminated or revoked.  Performed at [facility], [address] [place], [place] [address]      Labs: Basic Metabolic Panel: Recent Labs  Lab 03/10/21 1829 03/11/21 1444 03/12/21 0524 03/13/21 0451  NA 139 136 142 140  K 3.4* 3.1* 3.2* 3.6  CL 104 103 108 112*  CO2 27 26 31 23   GLUCOSE 92 93 88 81  BUN 17 20 11 7   CREATININE 0.88 0.79 0.82 0.60*  CALCIUM 8.8* 8.6* 8.2* 7.4*  MG   --   --   --  1.8  PHOS  --   --   --  2.9   Liver Function Tests: Recent Labs  Lab 03/10/21 1829 03/11/21 1444 03/13/21 0451  AST 81* 90* 55*  ALT 25 26 24   ALKPHOS 66 48 43  BILITOT 1.1 0.8 0.8  PROT 7.2 5.8* 4.7*  ALBUMIN 3.7 3.1* 2.3*   No results for input(s): LIPASE, AMYLASE in the last 168 hours. No results for input(s): AMMONIA in the last 168 hours. CBC: Recent Labs  Lab 03/10/21 1829 03/11/21 1444 03/12/21 0524 03/13/21 0451  WBC 7.3 7.1 5.3 5.3  NEUTROABS 5.4 4.8  --  3.1  HGB 16.8 15.1 14.0 11.9*  HCT 49.0 41.8 41.8 35.2*  MCV 98.8 96.1 96.5 100.3*  PLT 197 177 149* 132*   Cardiac Enzymes: Recent Labs  Lab 03/11/21 1444 03/12/21 0524 03/13/21 0451  CKTOTAL 4,622* 2,823* 924*   BNP: BNP (last 3 results) No results for input(s): BNP in the last 8760 hours.  ProBNP (last 3 results) No results for input(s): PROBNP in the last 8760 hours.  CBG: No results for input(s): GLUCAP in the last 168 hours.     Signed:  13/02/22 MD.  [facility] 03/13/2021, 3:11 PM

## 2021-03-13 NOTE — [place] FL2 (Signed)
[place] MEDICAID FL2 LEVEL OF CARE SCREENING TOOL     IDENTIFICATION  Patient Name: Jared Mayer Birthdate: 1971-05-09 Sex: male Admission Date (Current Location): 03/11/2021  [facility] and [place] Number:  Chiropodist and Address:         Provider Number: [id number]  Attending Physician Name and Address:  Zigmund Anzel., *  Relative Name and Phone Number:       Current Level of Care: Hospital Recommended Level of Care: Assisted Living Facility Prior Approval Number:    Date Approved/Denied:   PASRR Number:    Discharge Plan: Other (Comment) (ALF)    Current Diagnoses: Patient Active Problem List   Diagnosis Date Noted   Accidental overdose 03/13/2021   Altered mental status 03/11/2021   Schizoaffective disorder, bipolar type (HCC) 03/11/2021   Accidental overdose of lamotrigine 03/11/2021   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   Fall at home, initial encounter 03/11/2021   UTI (urinary tract infection) 03/29/2018    Orientation RESPIRATION BLADDER Height & Weight     Self, Place  Normal Incontinent Weight: 85.2 kg Height:  5\' 8"  (172.7 cm)  BEHAVIORAL SYMPTOMS/MOOD NEUROLOGICAL BOWEL NUTRITION STATUS      Continent Diet (regular)  AMBULATORY STATUS COMMUNICATION OF NEEDS Skin   Supervision Verbally Bruising                       Personal Care Assistance Level of Assistance              Functional Limitations Info             SPECIAL CARE FACTORS FREQUENCY  PT (By licensed PT), OT (By licensed OT)     PT Frequency: [facility] OT Frequency: [facility]            Contractures Contractures Info: Present    Additional Factors Info                  Medication List       TAKE these medications     atorvastatin 40 MG tablet Commonly known as: LIPITOR Take 1 tablet (40 mg total) by mouth daily. Start taking on: March 17, 2021 What changed: These instructions start on March 17, 2021.  If you are unsure what to do until then, ask your doctor or other care provider.    buPROPion 150 MG 24 hr tablet Commonly known as: WELLBUTRIN XL Take 300 mg by mouth daily.    chlorhexidine 0.12 % solution Commonly known as: PERIDEX 7.5 mLs by Mouth Rinse route 2 (two) times daily. After brushing teeth. Do not swallow or rinse with water or mouthwash after.    cholecalciferol 25 MCG (1000 UNIT) tablet Commonly known as: VITAMIN D3 Take 1,000 Units by mouth daily.    divalproex 500 MG 24 hr tablet Commonly known as: DEPAKOTE ER Take 500-2,000 mg by mouth See admin instructions. 500 mg every morning and 2000 mg at bedtime    docusate sodium 100 MG capsule Commonly known as: COLACE Take 100 mg by mouth 2 (two) times daily.    fexofenadine 180 MG tablet Commonly known as: ALLEGRA Take 180 mg by mouth daily as needed for allergies.    Fish Oil 1000 MG Caps Take 1,000 mg by mouth 2 (two) times daily.    fluticasone 50 MCG/ACT nasal spray Commonly known as: FLONASE Place 2 sprays into the nose daily as needed for allergies or rhinitis.    glycopyrrolate  1 MG tablet Commonly known as: ROBINUL Take 1 mg by mouth 2 (two) times daily.    hydrOXYzine 25 MG tablet Commonly known as: ATARAX/VISTARIL Take 25 mg by mouth 2 (two) times daily.    Ingrezza 80 MG capsule Generic drug: valbenazine Take 80 mg by mouth at bedtime.    lamoTRIgine 100 MG tablet Commonly known as: LAMICTAL Take 100 mg by mouth daily.    levothyroxine 112 MCG tablet Commonly known as: SYNTHROID Take 112 mcg by mouth daily. On an empty stomach with a glass of water 30-60 minutes before breakfast    linaclotide 290 MCG Caps capsule Commonly known as: LINZESS Take 290 mcg by mouth daily.    LORazepam 0.5 MG tablet Commonly known as: ATIVAN Take 0.5-1 mg by mouth 2 (two) times daily. Take 1 tablet (0.5 mg) each morning and 2 tablets (1 mg) at bedtime    OLANZapine 15 MG tablet Commonly known as:  ZYPREXA Take 30 mg by mouth at bedtime.    omeprazole 20 MG capsule Commonly known as: PRILOSEC Take 40 mg by mouth daily.    polyethylene glycol powder 17 GM/SCOOP powder Commonly known as: GLYCOLAX/MIRALAX Take 34 g by mouth daily. Mix with 8 ounces of water    simethicone 80 MG chewable tablet Commonly known as: MYLICON Chew 80 mg by mouth every 6 (six) hours as needed for flatulence.    sodium fluoride 1.1 % Crea dental cream Commonly known as: PREVIDENT 5000 PLUS Place 1 application onto teeth 2 (two) times daily.    sucralfate 1 g tablet Commonly known as: CARAFATE Take 1 g by mouth 2 (two) times daily. Before meals    Systane Complete 0.6 % Soln Generic drug: Propylene Glycol Place 1 drop into both eyes 3 (three) times daily as needed for dry eyes.    topiramate 25 MG tablet Commonly known as: TOPAMAX Take 75 mg by mouth 2 (two) times daily.    triamcinolone cream 0.1 % Commonly known as: KENALOG Apply 1 application topically 2 (two) times daily as needed (for inflammation/irritation).   Relevant Imaging Results:  Relevant Lab Results:   Additional Information SSN [id number]  Chapman Fitch, RN

## 2021-03-13 NOTE — Plan of Care (Signed)
Patient ID: Jared Mayer, male   DOB: [birth year], 50 y.o.   MRN: [id number]   Problem: Education: Goal: Knowledge of General Education information will improve Description: Including pain rating scale, medication(s)/side effects and non-pharmacologic comfort measures Outcome: Adequate for Discharge   Problem: Health Behavior/Discharge Planning: Goal: Ability to manage health-related needs will improve Outcome: Adequate for Discharge   Problem: Clinical Measurements: Goal: Ability to maintain clinical measurements within normal limits will improve Outcome: Adequate for Discharge Goal: Will remain free from infection Outcome: Adequate for Discharge Goal: Diagnostic test results will improve Outcome: Adequate for Discharge Goal: Respiratory complications will improve Outcome: Adequate for Discharge Goal: Cardiovascular complication will be avoided Outcome: Adequate for Discharge   Problem: Activity: Goal: Risk for activity intolerance will decrease Outcome: Adequate for Discharge   Problem: Nutrition: Goal: Adequate nutrition will be maintained Outcome: Adequate for Discharge   Problem: Coping: Goal: Level of anxiety will decrease Outcome: Adequate for Discharge   Problem: Elimination: Goal: Will not experience complications related to bowel motility Outcome: Adequate for Discharge Goal: Will not experience complications related to urinary retention Outcome: Adequate for Discharge   Problem: Pain Managment: Goal: General experience of comfort will improve Outcome: Adequate for Discharge   Problem: Safety: Goal: Ability to remain free from injury will improve Outcome: Adequate for Discharge   Problem: Skin Integrity: Goal: Risk for impaired skin integrity will decrease Outcome: Adequate for Discharge   Lidia Collum, RN

## 2021-03-13 NOTE — Progress Notes (Signed)
Patient ID: Jared Mayer, male   DOB: Oct 20, 1970, 50 y.o.   MRN: [id number]  Report called to Tammy at [facility]. Mom to transport.  Lidia Collum, RN

## 2021-03-13 NOTE — Plan of Care (Signed)
Patient ID: Jared Mayer, male   DOB: 02-04-1971, 50 y.o.   MRN: [id number]  Problem: Education: Goal: Knowledge of General Education information will improve Description: Including pain rating scale, medication(s)/side effects and non-pharmacologic comfort measures 03/13/2021 1627 by Lidia Collum, RN Outcome: Adequate for Discharge 03/13/2021 1617 by Lidia Collum, RN Outcome: Adequate for Discharge   Problem: Health Behavior/Discharge Planning: Goal: Ability to manage health-related needs will improve 03/13/2021 1627 by Lidia Collum, RN Outcome: Adequate for Discharge 03/13/2021 1617 by Lidia Collum, RN Outcome: Adequate for Discharge   Problem: Clinical Measurements: Goal: Ability to maintain clinical measurements within normal limits will improve 03/13/2021 1627 by Lidia Collum, RN Outcome: Adequate for Discharge 03/13/2021 1617 by Lidia Collum, RN Outcome: Adequate for Discharge Goal: Will remain free from infection 03/13/2021 1627 by Lidia Collum, RN Outcome: Adequate for Discharge 03/13/2021 1617 by Lidia Collum, RN Outcome: Adequate for Discharge Goal: Diagnostic test results will improve 03/13/2021 1627 by Lidia Collum, RN Outcome: Adequate for Discharge 03/13/2021 1617 by Lidia Collum, RN Outcome: Adequate for Discharge Goal: Respiratory complications will improve 03/13/2021 1627 by Lidia Collum, RN Outcome: Adequate for Discharge 03/13/2021 1617 by Lidia Collum, RN Outcome: Adequate for Discharge Goal: Cardiovascular complication will be avoided 03/13/2021 1627 by Lidia Collum, RN Outcome: Adequate for Discharge 03/13/2021 1617 by Lidia Collum, RN Outcome: Adequate for Discharge   Problem: Activity: Goal: Risk for activity intolerance will decrease 03/13/2021 1627 by Lidia Collum, RN Outcome: Adequate for Discharge 03/13/2021 1617 by Lidia Collum, RN Outcome: Adequate for Discharge   Problem:  Nutrition: Goal: Adequate nutrition will be maintained 03/13/2021 1627 by Lidia Collum, RN Outcome: Adequate for Discharge 03/13/2021 1617 by Lidia Collum, RN Outcome: Adequate for Discharge   Problem: Coping: Goal: Level of anxiety will decrease 03/13/2021 1627 by Lidia Collum, RN Outcome: Adequate for Discharge 03/13/2021 1617 by Lidia Collum, RN Outcome: Adequate for Discharge   Problem: Elimination: Goal: Will not experience complications related to bowel motility 03/13/2021 1627 by Lidia Collum, RN Outcome: Adequate for Discharge 03/13/2021 1617 by Lidia Collum, RN Outcome: Adequate for Discharge Goal: Will not experience complications related to urinary retention 03/13/2021 1627 by Lidia Collum, RN Outcome: Adequate for Discharge 03/13/2021 1617 by Lidia Collum, RN Outcome: Adequate for Discharge   Problem: Pain Managment: Goal: General experience of comfort will improve 03/13/2021 1627 by Lidia Collum, RN Outcome: Adequate for Discharge 03/13/2021 1617 by Lidia Collum, RN Outcome: Adequate for Discharge   Problem: Safety: Goal: Ability to remain free from injury will improve 03/13/2021 1627 by Lidia Collum, RN Outcome: Adequate for Discharge 03/13/2021 1617 by Lidia Collum, RN Outcome: Adequate for Discharge   Problem: Skin Integrity: Goal: Risk for impaired skin integrity will decrease 03/13/2021 1627 by Lidia Collum, RN Outcome: Adequate for Discharge 03/13/2021 1617 by Lidia Collum, RN Outcome: Adequate for Discharge   Lidia Collum, RN

## 2021-03-13 NOTE — Progress Notes (Signed)
PT Cancellation Note  Patient Details Name: Mills Mitton MRN: [id number] DOB: [birth year]   Cancelled Treatment:    Reason Eval/Treat Not Completed: Other (comment).  Chart reviewed.  Upon arrival to pt's room, pt resting in bed.  Pt reporting being tired and wanting to sleep (pt declined to get OOB and walk with therapist): nurse and NT notified.  Will re-attempt PT treatment session at a later date/time as able.  Hendricks Limes, PT 03/13/21, 9:09 AM

## 2021-03-13 NOTE — Progress Notes (Signed)
Physical Therapy Treatment Patient Details Name: Jared Mayer MRN: [id number] DOB: 1971-02-25 Today's Date: 03/13/2021   History of Present Illness Jared Mayer is a 50 y.o. male with history of schizoaffective disorder, hyperlipidemia, here with generalized weakness.  The patient was here yesterday because over the last 2 days, he has increasing weakness, difficulty walking, tremors.  He has had multiple falls.  He has been more confused.  He has also had questionable low-grade fever and chills, as well as body aches in his bilateral legs.    PT Comments    Attempted to see pt later in morning but pt soundly sleeping and when therapist arrived in afternoon pt was just starting lunch and pt declined therapy session d/t wanting to eat.  Therapist returned later in afternoon and pt was awake with visitors (pt's mom) present.  Pt wanting to hold onto something after standing for support so RW utilized during session per pt request.  Pt able to ambulate around nursing loop with RW CGA and was steady with RW use.  L drift was noted during ambulation but is baseline per pt's mom.  Discussed with pt and pt's mom that RW appeared to improve pt stability (and pt preferring to use RW for walking) but also need to monitor for pt's ability to navigate around obstacles within home with RW use (especially with L drift): pt's mom verbalizing appropriate understanding.  Will continue to focus on balance and progressive functional mobility during hospitalization.   Recommendations for follow up therapy are one component of a multi-disciplinary discharge planning process, led by the attending physician.  Recommendations may be updated based on patient status, additional functional criteria and insurance authorization.  Follow Up Recommendations  Home health PT     Assistance Recommended at Discharge Intermittent Supervision/Assistance  Equipment Recommendations  Rolling walker (2 wheels)    Recommendations for  Other Services       Precautions / Restrictions Precautions Precautions: Fall Restrictions Weight Bearing Restrictions: No     Mobility  Bed Mobility Overal bed mobility: Needs Assistance Bed Mobility: Supine to Sit;Sit to Supine     Supine to sit: Supervision;HOB elevated Sit to supine: Supervision;HOB elevated   General bed mobility comments: increased effort to perform semi-supine to sitting edge of bed on own    Transfers Overall transfer level: Needs assistance Equipment used: Rolling walker (2 wheels) Transfers: Sit to/from Stand Sit to Stand: Min guard;Supervision           General transfer comment: 1st trial standing (no AD use) pt standing and reaching for IV pole for support (CGA provided for safety) but next 2 trials pt able to safely and strongly stand with use of RW    Ambulation/Gait Ambulation/Gait assistance: Min guard Gait Distance (Feet): 200 Feet Assistive device: Rolling walker (2 wheels) Gait Pattern/deviations: Trunk flexed;Step-through pattern     General Gait Details: drifts L at times; no loss of balance using RW   Stairs             Wheelchair Mobility    Modified Rankin (Stroke Patients Only)       Balance Overall balance assessment: Needs assistance Sitting-balance support: No upper extremity supported;Feet supported Sitting balance-Leahy Scale: Good Sitting balance - Comments: steady sitting reaching within BOS   Standing balance support: Bilateral upper extremity supported;During functional activity Standing balance-Leahy Scale: Good Standing balance comment: no loss of balance noted with ambulation using RW  Cognition Arousal/Alertness: Awake/alert Behavior During Therapy: Flat affect Overall Cognitive Status: History of cognitive impairments - at baseline                                          Exercises      General Comments  Pt agreeable to PT  session.      Pertinent Vitals/Pain Pain Assessment: Faces Faces Pain Scale: No hurt Pain Intervention(s): Limited activity within patient's tolerance;Monitored during session;Repositioned Vitals (HR and O2 on room air) stable and WFL throughout treatment session.    Home Living                          Prior Function            PT Goals (current goals can now be found in the care plan section) Acute Rehab PT Goals PT Goal Formulation: With patient/family Progress towards PT goals: Progressing toward goals    Frequency    Min 2X/week      PT Plan Current plan remains appropriate    Co-evaluation              AM-PAC PT "6 Clicks" Mobility   Outcome Measure  Help needed turning from your back to your side while in a flat bed without using bedrails?: None Help needed moving from lying on your back to sitting on the side of a flat bed without using bedrails?: A Little Help needed moving to and from a bed to a chair (including a wheelchair)?: A Little Help needed standing up from a chair using your arms (e.g., wheelchair or bedside chair)?: A Little Help needed to walk in hospital room?: A Little Help needed climbing 3-5 steps with a railing? : A Little 6 Click Score: 19    End of Session Equipment Utilized During Treatment: Gait belt Activity Tolerance: Patient tolerated treatment well Patient left: in bed;with call bell/phone within reach;with bed alarm set;with family/visitor present Nurse Communication: Mobility status;Precautions PT Visit Diagnosis: Unsteadiness on feet (R26.81);Muscle weakness (generalized) (M62.81);History of falling (Z91.81);Difficulty in walking, not elsewhere classified (R26.2)     Time: 7867-6720 PT Time Calculation (min) (ACUTE ONLY): 33 min  Charges:  $Gait Training: 8-22 mins $Therapeutic Activity: 8-22 mins                    Hendricks Limes, PT 03/13/21, 4:25 PM

## 2021-03-13 NOTE — Progress Notes (Signed)
PROGRESS NOTE    Jared [place]  XJD:[id number] DOB: 01-09-1971 DOA: 03/11/2021 PCP: Pcp, No (Confirm with patient/family/NH records and if not entered, this HAS to be entered at [facility] point of entry. "No PCP" if truly none.)   Chief Complaint  Patient presents with   Weakness    Brief Narrative:  Jared [place] 50 y.o. male with medical history significant of Schizoaffective disorder, h/o intellectual disability was found to have unsteady gait leading to possibly 4 falls per mother. Patient is Jared Mayer resident of local group home. He was brought into ED on 10/31 but D/Ced back. He had more falls and AMS so mother was notified who inquired with group home personal and was found that there was Jared Mayer recent pharmacy change with group home and patient's room mate Jared Mayer might have been exchanged with him. Instead of him receiving 100 mg BID, he might have received 300 mg in am 400 mg QHS over last 3 days so it's assumed that he likely received 700 mg more of Jared [place] dose. Mother feels his mentation is somewhat improved than before but not quite back to baseline.    Assessment & Plan:   Principal Problem:   Accidental overdose of Jared [place] Active Problems:   Altered mental status   Schizoaffective disorder, bipolar type (HCC)   Mild intellectual disability   Fall at home, initial encounter  Discharge pending additional improvement in his lethargy - workup additionally as needed  AMS Improving, thought 2/2 Jared [place] overdose Still lethargic this morning - suspect related to Jared [place]/hospitalization Follow b12, venous blood gas, folate.  TSH wnl. Delirium precautions  Accidental Jared [place] Overdose 700 mg over past 3 days (typically 100 mg BID) Jared [place] level is 18.4 -> wnl Resumed per psych today, will discuss in setting of continued encephalopathy Appreciate psych recs - symptoms could be related to Jared [place] especially in setting of depakote - recommended 1-2 days of observation and  holding Jared Mayer at least 1 days  Recurrent Falls Related to Jared [place] PT/OT, follow MRI brain and lumbar spine wnl Head CT with mild asymmetric atrophy of L cerebral cortex  Elevated CK  Mild Rhabdo Possibly related to Jared Mayer overdose Continue IVF and trend CK  Intellectual Disability  Schizoaffective disorder  DVT prophylaxis: lovenox Code Status: full  Family Communication: family over phone Disposition:   Status is: Observation  The patient will require care spanning > 2 midnights and should be moved to inpatient because: continued need for IVF in setting of elevated CK with accidental Jared [place] overdose      Consultants:  psychiatry  Procedures:  none  Antimicrobials:  Anti-infectives (From admission, onward)    None          Subjective: Sleepy this mornign  Objective: Vitals:   03/12/21 1545 03/12/21 2022 03/13/21 0349 03/13/21 0804  BP: 109/71 (!) 102/47 103/66 103/67  Pulse: (!) 102 92 73 64  Resp: 18 18 18 19   Temp: 98.4 F (36.9 C) 98.4 F (36.9 C) 99.1 F (37.3 C) 98.1 F (36.7 C)  TempSrc: Oral  Oral   SpO2: 100% 96% 98% 97%  Weight:      Height:        Intake/Output Summary (Last 24 hours) at 03/13/2021 1441 Last data filed at 03/12/2021 1945 Gross per 24 hour  Intake 1571.36 ml  Output 1 ml  Net 1570.36 ml   Filed Weights   03/12/21 0900  Weight: 85.2 kg    Examination:  General: No acute distress. Cardiovascular: RRR Lungs: unlabored  Abdomen: Soft, nontender, nondistended  Neurological: moving all extremities, extremely sleepy today, able to arouse, but seems more sleepy than baseline when discussing with mother Skin: Warm and dry. No rashes or lesions. Extremities: No clubbing or cyanosis. No edema.   Data Reviewed: I have personally reviewed following labs and imaging studies  CBC: Recent Labs  Lab 03/10/21 1829 03/11/21 1444 03/12/21 0524 03/13/21 0451  WBC 7.3 7.1 5.3 5.3  NEUTROABS 5.4 4.8  --  3.1   HGB 16.8 15.1 14.0 11.9*  HCT 49.0 41.8 41.8 35.2*  MCV 98.8 96.1 96.5 100.3*  PLT 197 177 149* 132*    Basic Metabolic Panel: Recent Labs  Lab 03/10/21 1829 03/11/21 1444 03/12/21 0524 03/13/21 0451  NA 139 136 142 140  K 3.4* 3.1* 3.2* 3.6  CL 104 103 108 112*  CO2 27 26 31 23   GLUCOSE 92 93 88 81  BUN 17 20 11 7   CREATININE 0.88 0.79 0.82 0.60*  CALCIUM 8.8* 8.6* 8.2* 7.4*  MG  --   --   --  1.8  PHOS  --   --   --  2.9    GFR: Estimated Creatinine Clearance: 117.3 mL/min (Jared Mayer) (by C-G formula based on SCr of 0.6 mg/dL (L)).  Liver Function Tests: Recent Labs  Lab 03/10/21 1829 03/11/21 1444 03/13/21 0451  AST 81* 90* 55*  ALT 25 26 24   ALKPHOS 66 48 43  BILITOT 1.1 0.8 0.8  PROT 7.2 5.8* 4.7*  ALBUMIN 3.7 3.1* 2.3*    CBG: No results for input(s): GLUCAP in the last 168 hours.   Recent Results (from the past 240 hour(s))  Blood culture (routine x 2)     Status: None (Preliminary result)   Collection Time: 03/10/21  6:29 PM   Specimen: BLOOD  Result Value Ref Range Status   Specimen Description BLOOD RIGHT ANTECUBITAL  Final   Special Requests   Final    BOTTLES DRAWN AEROBIC AND ANAEROBIC Blood Culture adequate volume   Culture   Final    NO GROWTH 3 DAYS Performed at [facility], [address] [place], [facility] [address]    Report Status PENDING  Incomplete  Blood culture (routine x 2)     Status: None (Preliminary result)   Collection Time: 03/10/21  6:29 PM   Specimen: BLOOD  Result Value Ref Range Status   Specimen Description BLOOD BLOOD RIGHT ARM  Final   Special Requests   Final    BOTTLES DRAWN AEROBIC AND ANAEROBIC Blood Culture adequate volume   Culture   Final    NO GROWTH 3 DAYS Performed at [facility], [address] [place], [facility] [address]    Report Status PENDING  Incomplete  Resp Panel by RT-PCR (Flu Omeka Holben&B, Covid) Urine, Clean Catch     Status: None   Collection Time: 03/10/21  6:44 PM   Specimen:  Urine, Clean Catch; Nasopharyngeal(NP) swabs in vial transport medium  Result Value Ref Range Status   SARS Coronavirus 2 by RT PCR NEGATIVE NEGATIVE Final    Comment: (NOTE) SARS-CoV-2 target nucleic acids are NOT DETECTED.  The SARS-CoV-2 RNA is generally detectable in upper respiratory specimens during the acute phase of infection. The lowest concentration of SARS-CoV-2 viral copies this assay can detect is 138 copies/mL. Shenekia Riess negative result does not preclude SARS-Cov-2 infection and should not be used as the sole basis for treatment or other patient management decisions. Zora Glendenning negative result may occur with  improper  specimen collection/handling, submission of specimen other than nasopharyngeal swab, presence of viral mutation(s) within the areas targeted by this assay, and inadequate number of viral copies(<138 copies/mL). Keontay Vora negative result must be combined with clinical observations, patient history, and epidemiological information. The expected result is Negative.  Fact Sheet for Patients:  BloggerCourse.com  Fact Sheet for Healthcare Providers:  SeriousBroker.it  This test is no t yet approved or cleared by the [place] FDA and  has been authorized for detection and/or diagnosis of SARS-CoV-2 by FDA under an Emergency Use Authorization (EUA). This EUA will remain  in effect (meaning this test can be used) for the duration of the COVID-19 declaration under Section 564(b)(1) of the Act, 21 U.S.C.section 360bbb-3(b)(1), unless the authorization is terminated  or revoked sooner.       Influenza Aleksa Catterton by PCR NEGATIVE NEGATIVE Final   Influenza B by PCR NEGATIVE NEGATIVE Final    Comment: (NOTE) The Xpert Xpress SARS-CoV-2/FLU/RSV plus assay is intended as an aid in the diagnosis of influenza from Nasopharyngeal swab specimens and should not be used as [place] sole basis for treatment. Nasal washings and aspirates are unacceptable for  Xpert Xpress SARS-CoV-2/FLU/RSV testing.  Fact Sheet for Patients: BloggerCourse.com  Fact Sheet for Healthcare Providers: SeriousBroker.it  This test is not yet approved or cleared by the [place] FDA and has been authorized for detection and/or diagnosis of SARS-CoV-2 by FDA under an Emergency Use Authorization (EUA). This EUA will remain in effect (meaning this test can be used) for the duration of the COVID-19 declaration under Section 564(b)(1) of the Act, 21 U.S.C. section 360bbb-3(b)(1), unless the authorization is terminated or revoked.  Performed at [facility], [address] [place], [place] [address]   Urine Culture     Status: None   Collection Time: 03/10/21  6:51 PM   Specimen: Urine, Random  Result Value Ref Range Status   Specimen Description   Final    URINE, RANDOM Performed at [facility], [address], [place], [place] [address]    Special Requests   Final    NONE Performed at [facility], [address], [place], [place] [address]    Culture   Final    NO GROWTH Performed at [facility], [address] [place], [place] [address]    Report Status 03/11/2021 FINAL  Final  Resp Panel by RT-PCR (Flu Nicholi Ghuman&B, Covid) Nasopharyngeal Swab     Status: None   Collection Time: 03/11/21  2:44 PM   Specimen: Nasopharyngeal Swab; Nasopharyngeal(NP) swabs in vial transport medium  Result Value Ref Range Status   SARS Coronavirus 2 by RT PCR NEGATIVE NEGATIVE Final    Comment: (NOTE) SARS-CoV-2 target nucleic acids are NOT DETECTED.  The SARS-CoV-2 RNA is generally detectable in upper respiratory specimens during the acute phase of infection. The lowest concentration of SARS-CoV-2 viral copies this assay can detect is 138 copies/mL. Starnisha Batrez negative result does not preclude SARS-Cov-2 infection and should not be used as the sole basis for treatment or other patient  management decisions. Dezirea Mccollister negative result may occur with  improper specimen collection/handling, submission of specimen other than nasopharyngeal swab, presence of viral mutation(s) within the areas targeted by this assay, and inadequate number of viral copies(<138 copies/mL). Relena Ivancic negative result must be combined with clinical observations, patient history, and epidemiological information. The expected result is Negative.  Fact Sheet for Patients:  BloggerCourse.com  Fact Sheet for Healthcare Providers:  SeriousBroker.it  This test  is no t yet approved or cleared by the [place] and  has been authorized for detection and/or diagnosis of SARS-CoV-2 by FDA under an Emergency Use Authorization (EUA). This EUA will remain  in effect (meaning this test can be used) for the duration of the COVID-19 declaration under Section 564(b)(1) of the Act, 21 U.S.C.section 360bbb-3(b)(1), unless the authorization is terminated  or revoked sooner.       Influenza Kalina Morabito by PCR NEGATIVE NEGATIVE Final   Influenza B by PCR NEGATIVE NEGATIVE Final    Comment: (NOTE) The Xpert Xpress SARS-CoV-2/FLU/RSV plus assay is intended as an aid in the diagnosis of influenza from Nasopharyngeal swab specimens and should not be used as Jemila Camille sole basis for treatment. Nasal washings and aspirates are unacceptable for Xpert Xpress SARS-CoV-2/FLU/RSV testing.  Fact Sheet for Patients: BloggerCourse.com  Fact Sheet for Healthcare Providers: SeriousBroker.it  This test is not yet approved or cleared by the [place] FDA and has been authorized for detection and/or diagnosis of SARS-CoV-2 by FDA under an Emergency Use Authorization (EUA). This EUA will remain in effect (meaning this test can be used) for the duration of the COVID-19 declaration under Section 564(b)(1) of the Act, 21 U.S.C. section 360bbb-3(b)(1),  unless the authorization is terminated or revoked.  Performed at [facility], [address] [place], [place] [address]          Radiology Studies: No results found.      Scheduled Meds:  buPROPion  300 mg Oral Daily   chlorhexidine  7.5 mL Mouth Rinse BID   cholecalciferol  1,000 Units Oral Daily   divalproex  2,000 mg Oral QHS   divalproex  500 mg Oral Daily   docusate sodium  100 mg Oral BID   enoxaparin (LOVENOX) injection  40 mg Subcutaneous Q24H   glycopyrrolate  1 mg Oral BID   hydrOXYzine  25 mg Oral BID   Jared [place]  100 mg Oral Daily   levothyroxine  112 mcg Oral Q0600   linaclotide  290 mcg Oral Daily   loratadine  10 mg Oral Daily   LORazepam  0.5 mg Oral Daily   LORazepam  1 mg Oral QHS   OLANZapine  30 mg Oral QHS   omega-3 acid ethyl esters  1,000 mg Oral BID   pantoprazole  40 mg Oral Daily   polyethylene glycol  34 g Oral Daily   sucralfate  1 g Oral BID   topiramate  75 mg Oral BID   valbenazine  80 mg Oral QHS   Continuous Infusions:  sodium chloride 75 mL/hr at 03/13/21 0958     LOS: 0 days    Time spent: over 30 min    Lacretia Nicks, MD [facility]   To contact the attending provider between 7A-7P or the covering provider during after hours 7P-7A, please log into the web site [email or web address] and access using universal [place] password for that web site. If you do not have the password, please call the hospital operator.  03/13/2021, 2:41 PM

## 2021-03-13 NOTE — TOC Progression Note (Addendum)
Transition of Care [facility]) - Progression Note    Patient Details  Name: Jared [place] MRN: [id number] Date of Birth: Dec 25, 1970  [facility] CM/SW Contact  Chapman Fitch, RN Phone Number: 03/13/2021, 1:13 PM  Clinical Narrative:      Spoke with Tammy at [facility].  She confirms that patient can return at discharge Tammy has not been able to arrange home health services for patient.  She requests that [facility] make referral and does not have a preference of of agency.   Referral made to [place] with [facility]       Expected Discharge Plan and Services                                                 Social Determinants of Health (SDOH) Interventions    Readmission Risk Interventions No flowsheet data found.

## 2021-03-13 NOTE — Plan of Care (Signed)

## 2021-03-15 LAB — CULTURE, BLOOD (ROUTINE X 2)
Culture: NO GROWTH
Culture: NO GROWTH
Special Requests: ADEQUATE
Special Requests: ADEQUATE

## 2021-04-23 ENCOUNTER — Emergency Department: Payer: Medicare HMO

## 2021-04-23 ENCOUNTER — Other Ambulatory Visit: Payer: Self-pay

## 2021-04-23 ENCOUNTER — Emergency Department
Admission: EM | Admit: 2021-04-23 | Discharge: 2021-04-23 | Disposition: A | Discharge status: Home / Self Care | Payer: Medicare HMO | Attending: Emergency Medicine | Admitting: Emergency Medicine

## 2021-04-23 DIAGNOSIS — W1839XA Other fall on same level, initial encounter: Secondary | ICD-10-CM | POA: Insufficient documentation

## 2021-04-23 DIAGNOSIS — R531 Weakness: Secondary | ICD-10-CM | POA: Diagnosis not present

## 2021-04-23 DIAGNOSIS — E039 Hypothyroidism, unspecified: Secondary | ICD-10-CM | POA: Diagnosis not present

## 2021-04-23 DIAGNOSIS — Z79899 Other long term (current) drug therapy: Secondary | ICD-10-CM | POA: Diagnosis not present

## 2021-04-23 DIAGNOSIS — W19XXXA Unspecified fall, initial encounter: Secondary | ICD-10-CM

## 2021-04-23 LAB — BASIC METABOLIC PANEL
Anion gap: 4 — ABNORMAL LOW (ref 5–15)
BUN: 12 mg/dL (ref 6–20)
CO2: 29 mmol/L (ref 22–32)
Calcium: 8.8 mg/dL — ABNORMAL LOW (ref 8.9–10.3)
Chloride: 101 mmol/L (ref 98–111)
Creatinine, Ser: 0.8 mg/dL (ref 0.61–1.24)
GFR, Estimated: >60 mL/min (ref >60)
Glucose, Bld: 83 mg/dL (ref 70–99)
Potassium: 3.8 mmol/L (ref 3.5–5.1)
Sodium: 134 mmol/L — ABNORMAL LOW (ref 135–145)

## 2021-04-23 LAB — CBC
HCT: 42.8 % (ref 39.0–52.0)
Hemoglobin: 14.6 g/dL (ref 13.0–17.0)
MCH: 33.5 pg (ref 26.0–34.0)
MCHC: 34.1 g/dL (ref 30.0–36.0)
MCV: 98.2 fL (ref 80.0–100.0)
Platelets: 157 10*3/uL (ref 150–400)
RBC: 4.36 MIL/uL (ref 4.22–5.81)
RDW: 13.5 % (ref 11.5–15.5)
WBC: 4.1 10*3/uL (ref 4.0–10.5)
nRBC: 0 % (ref 0.0–0.2)

## 2021-04-23 NOTE — Discharge Instructions (Signed)
You have been seen in the emergency department after 2 falls last night.  Your work-up today shows reassuring results including blood work and a CT scan of your head.  Please return to the emergency department for any symptoms personally concerning to yourself or staff members.

## 2021-04-23 NOTE — ED Provider Notes (Signed)
[facility] Emergency Department Provider Note  Time seen: 10:11 AM  I have reviewed the triage vital signs and the nursing notes.   HISTORY  Chief Complaint Weakness   HPI Jared [place] is a 50 y.o. male with a past medical history of intellectual disability, hyperlipidemia, presents to the emergency department from his care facility for 2 falls.  According to the patient's mother who received report of the 2 falls states the patient fell around 2 AM and again this morning.  Both of which appeared to be mechanical such as getting his walker tangled up in the bathroom.  Here the patient does not appear to have any complaints.  At his care facility the nurse practitioner recommended they come to the emergency department to make sure the patient's not had a stroke.  The mother states the patient is acting at his baseline.  No acute abnormalities.  No acute complaints.  Patient has been ambulatory since the fall without issue.   Past Medical History:  Diagnosis Date   Dyslipidemia    Hypothyroid    Manic behavior (HCC)    Mentally disabled    Schizoaffective disorder, bipolar type [facility])     Patient Active Problem List   Diagnosis Date Noted   Accidental overdose 03/13/2021   Altered mental status 03/11/2021   Schizoaffective disorder, bipolar type (HCC) 03/11/2021   Accidental overdose of lamotrigine 03/11/2021   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   Fall at home, initial encounter 03/11/2021   UTI (urinary tract infection) 03/29/2018    History reviewed. No pertinent surgical history.  Prior to Admission medications   Medication Sig Start Date End Date Taking? Authorizing Provider  atorvastatin (LIPITOR) 40 MG tablet Take 1 tablet (40 mg total) by mouth daily. 03/17/21   Zigmund Oluwadarasimi., MD  buPROPion (WELLBUTRIN XL) 150 MG 24 hr tablet Take 300 mg by mouth daily.    [provider]  chlorhexidine (PERIDEX) 0.12 % solution 7.5 mLs by  Mouth Rinse route 2 (two) times daily. After brushing teeth. Do not swallow or rinse with water or mouthwash after. 01/30/21   [provider]  cholecalciferol (VITAMIN D3) 25 MCG (1000 UT) tablet Take 1,000 Units by mouth daily.    [provider]  divalproex (DEPAKOTE ER) 500 MG 24 hr tablet Take 500-2,000 mg by mouth See admin instructions. 500 mg every morning and 2000 mg at bedtime    [provider]  docusate sodium (COLACE) 100 MG capsule Take 100 mg by mouth 2 (two) times daily.    [provider]  fexofenadine (ALLEGRA) 180 MG tablet Take 180 mg by mouth daily as needed for allergies.     [provider]  fluticasone (FLONASE) 50 MCG/ACT nasal spray Place 2 sprays into the nose daily as needed for allergies or rhinitis.     [provider]  glycopyrrolate (ROBINUL) 1 MG tablet Take 1 mg by mouth 2 (two) times daily. 03/05/21   [provider]  hydrOXYzine (ATARAX/VISTARIL) 25 MG tablet Take 25 mg by mouth 2 (two) times daily. 03/10/21   [provider]  INGREZZA 80 MG capsule Take 80 mg by mouth at bedtime. 02/17/21   [provider]  lamoTRIgine (LAMICTAL) 100 MG tablet Take 100 mg by mouth daily.    [provider]  levothyroxine (SYNTHROID) 112 MCG tablet Take 112 mcg by mouth daily. On an empty stomach with a glass of water 30-60 minutes before breakfast    [provider]  linaclotide (LINZESS) 290 MCG CAPS capsule Take 290 mcg by mouth daily.    [provider]  LORazepam (ATIVAN) 0.5 MG tablet Take 0.5-1 mg by mouth 2 (two) times daily. Take 1 tablet (0.5 mg) each morning and 2 tablets (1 mg) at bedtime 03/10/21   [provider]  OLANZapine (ZYPREXA) 15 MG tablet Take 30 mg by mouth at bedtime.    [provider]  Omega-3 Fatty Acids (FISH OIL) 1000 MG CAPS Take 1,000 mg by mouth 2 (two) times daily.    [provider]  omeprazole (PRILOSEC) 20 MG  capsule Take 40 mg by mouth daily.    [provider]  polyethylene glycol powder (GLYCOLAX/MIRALAX) 17 GM/SCOOP powder Take 34 g by mouth daily. Mix with 8 ounces of water 11/19/20   [provider]  simethicone (MYLICON) 80 MG chewable tablet Chew 80 mg by mouth every 6 (six) hours as needed for flatulence.    [provider]  sodium fluoride (PREVIDENT 5000 PLUS) 1.1 % CREA dental cream Place 1 application onto teeth 2 (two) times daily.    [provider]  sucralfate (CARAFATE) 1 g tablet Take 1 g by mouth 2 (two) times daily. Before meals    [provider]  SYSTANE COMPLETE 0.6 % SOLN Place 1 drop into both eyes 3 (three) times daily as needed for dry eyes. 01/21/21   [provider]  topiramate (TOPAMAX) 25 MG tablet Take 75 mg by mouth 2 (two) times daily.    [provider]  triamcinolone cream (KENALOG) 0.1 % Apply 1 application topically 2 (two) times daily as needed (for inflammation/irritation).    [provider]    No Known Allergies  Family History  Problem Relation Age of Onset   Hypothyroidism Mother    Sarcoidosis Mother     Social History Social History   Tobacco Use   Smoking status: Never   Smokeless tobacco: Never  Vaping Use   Vaping Use: Never used  Substance Use Topics   Alcohol use: Never   Drug use: Never    Review of Systems largely per family Constitutional: Acting at baseline. Respiratory: Negative for cough/illness. Gastrointestinal: Did complain of abdominal pain which is fairly common for the patient per family.  No reported vomiting or diarrhea. Musculoskeletal: Does not appear to have any extremity discomfort. All other ROS negative  ____________________________________________   PHYSICAL EXAM:  VITAL SIGNS: ED Triage Vitals  Enc Vitals Group     BP 04/23/21 0901 113/75     Pulse Rate 04/23/21 0901 99     Resp 04/23/21 0901 17     Temp --      Temp src --       SpO2 04/23/21 0901 97 %     Weight --      Height 04/23/21 0904 5\' 8"  (1.727 m)     Head Circumference --      Peak Flow --      Pain Score --      Pain Loc --      Pain Edu? --      Excl. in GC? --    Constitutional: Awake alert, no distress.  Calm, cooperative. Eyes: Normal exam ENT      Head: Normocephalic and atraumatic.      Mouth/Throat: Mucous membranes are moist. Cardiovascular: Normal rate, regular rhythm.  Respiratory: Normal respiratory effort without tachypnea nor retractions. Breath sounds are clear Gastrointestinal: Soft and nontender. No  distention.  No reaction to palpation. Musculoskeletal: Nontender with normal range of motion in all extremities.  No concern for fracture or dislocation. Neurologic: Acting at his baseline.  Moves all extremities.  Equal grip strength bilaterally.  5/5 strength of bilateral upper and lower extremities. Skin:  Skin is warm Psychiatric: Mood and affect are normal.  Calm cooperative and pleasant.  ____________________________________________    EKG  EKG viewed and interpreted by myself shows a normal sinus rhythm at 96 bpm with a narrow QRS, normal axis, normal intervals, no concerning ST changes.  ____________________________________________    RADIOLOGY  IMPRESSION:  No acute intracranial findings are seen in noncontrast CT brain.  Cortical atrophy, more so in the left cerebral hemisphere. This  finding appears stable.   Mild chronic maxillary sinusitis.   ____________________________________________   INITIAL IMPRESSION / ASSESSMENT AND PLAN / ED COURSE  Pertinent labs & imaging results that were available during my care of the patient were reviewed by me and considered in my medical decision making (see chart for details).   Patient presents to the emergency department for a fall x2 this morning.  Currently the patient appears well.  Reassuring physical exam.  Reassuring neurological exam.  Patient has been ambulatory  without issue.  Basic lab work is largely nonrevealing.  We will obtain a CT scan of the head as a precaution.  If CT is negative anticipate likely discharge back to his care facility.  Family agreeable to plan of care.  CT scan is negative.  Blood work is nonrevealing.  Patient appears well.  We will discharge back to his care facility.  Mom agreeable to plan of care.  Jared [place] was evaluated in Emergency Department on 04/23/2021 for the symptoms described in the history of present illness. He was evaluated in the context of the global COVID-19 pandemic, which necessitated consideration that the patient might be at risk for infection with the SARS-CoV-2 virus that causes COVID-19. Institutional protocols and algorithms that pertain to the evaluation of patients at risk for COVID-19 are in a state of rapid change based on information released by regulatory bodies including the CDC and federal and state organizations. These policies and algorithms were followed during the patient's care in the ED.  ____________________________________________   FINAL CLINICAL IMPRESSION(S) / ED DIAGNOSES  Thresa Ross, MD 04/23/21 1153

## 2021-04-23 NOTE — ED Triage Notes (Signed)
Per pt mother/guardian, pt had 2 falls this morning, the NP at the facility wants the pt r/o for stroke. Pt is ambulatory with walker at baseline, no fever N/V or other sx

## 2021-09-16 ENCOUNTER — Ambulatory Visit: Payer: Medicare Other | Admitting: Physician Assistant

## 2021-09-25 ENCOUNTER — Encounter: Payer: Self-pay | Admitting: Physician Assistant

## 2021-09-25 ENCOUNTER — Ambulatory Visit (INDEPENDENT_AMBULATORY_CARE_PROVIDER_SITE_OTHER): Payer: Medicare HMO | Admitting: Physician Assistant

## 2021-09-25 VITALS — BP 108/72 | HR 88 | Ht 68.0 in | Wt 187.0 lb

## 2021-09-25 DIAGNOSIS — R3915 Urgency of urination: Secondary | ICD-10-CM

## 2021-09-25 DIAGNOSIS — R300 Dysuria: Secondary | ICD-10-CM | POA: Diagnosis not present

## 2021-09-25 LAB — URINALYSIS, COMPLETE
Bilirubin, UA: NEGATIVE
Glucose, UA: NEGATIVE
Leukocytes,UA: NEGATIVE
Nitrite, UA: NEGATIVE
Protein,UA: NEGATIVE
RBC, UA: NEGATIVE
Specific Gravity, UA: 1.025 (ref 1.005–1.030)
Urobilinogen, Ur: 2 mg/dL — ABNORMAL HIGH (ref 0.2–1.0)
pH, UA: 6.5 (ref 5.0–7.5)

## 2021-09-25 LAB — MICROSCOPIC EXAMINATION: Bacteria, UA: NONE SEEN

## 2021-09-25 LAB — BLADDER SCAN AMB NON-IMAGING: Scan Result: 10

## 2021-09-25 MED ORDER — MIRABEGRON ER 50 MG PO TB24: 50.0000 mg | ORAL_TABLET | Freq: Every day | ORAL | 0 refills | Status: DC

## 2021-09-25 NOTE — Progress Notes (Signed)
In and Out Catheterization  Patient is present today for a I & O catheterization due to dysuria. Patient was cleaned and prepped in a sterile fashion with betadine . A 14FR cath was inserted no complications were noted , 71ml of urine return was noted, urine was yellow in color. A clean urine sample was collected for UA. Bladder was drained  And catheter was removed with out difficulty.    Performed by: Milas Kocher, CMA

## 2021-09-25 NOTE — Progress Notes (Signed)
09/25/2021 4:49 PM   Jared Mayer 24-Feb-1971 [id number]  CC: Chief Complaint  Patient presents with   Dysuria   HPI: Jared Mayer is a 51 y.o. male with PMH developmental delay and schizoaffective disorder who presents today for evaluation of urgency, frequency, and dysuria.  He resides in a group home and is accompanied today by one of his caregivers, who contributes to HPI.  Today he reports a several months long history of urinary urgency and frequency as well as terminal dysuria.  He denies gross hematuria.  Per chart review, PSA on 04/23/2020 was 0.38.  No more recent labs available.  In-office UA today positive for 1+ ketones and 2.0 EU/DL urobilinogen; urine microscopy pan negative. PVR 12mL.  PMH: Past Medical History:  Diagnosis Date   Dyslipidemia    Hypothyroid    Manic behavior (HCC)    Mentally disabled    Schizoaffective disorder, bipolar type (HCC)     Surgical History: No past surgical history on file.  Home Medications:  Allergies as of 09/25/2021   No Known Allergies      Medication List        Accurate as of Sep 25, 2021  4:49 PM. If you have any questions, ask your nurse or doctor.          atorvastatin 40 MG tablet Commonly known as: LIPITOR Take 1 tablet (40 mg total) by mouth daily.   buPROPion 150 MG 24 hr tablet Commonly known as: WELLBUTRIN XL Take 300 mg by mouth daily.   chlorhexidine 0.12 % solution Commonly known as: PERIDEX 7.5 mLs by Mouth Rinse route 2 (two) times daily. After brushing teeth. Do not swallow or rinse with water or mouthwash after.   cholecalciferol 25 MCG (1000 UNIT) tablet Commonly known as: VITAMIN D3 Take 1,000 Units by mouth daily.   divalproex 500 MG 24 hr tablet Commonly known as: DEPAKOTE ER Take 500-2,000 mg by mouth See admin instructions. 500 mg every morning and 2000 mg at bedtime   docusate sodium 100 MG capsule Commonly known as: COLACE Take 100 mg by mouth 2 (two) times daily.    fexofenadine 180 MG tablet Commonly known as: ALLEGRA Take 180 mg by mouth daily as needed for allergies.   Fish Oil 1000 MG Caps Take 1,000 mg by mouth 2 (two) times daily.   fluticasone 50 MCG/ACT nasal spray Commonly known as: FLONASE Place 2 sprays into the nose daily as needed for allergies or rhinitis.   glycopyrrolate 1 MG tablet Commonly known as: ROBINUL Take 1 mg by mouth 2 (two) times daily.   hydrOXYzine 25 MG tablet Commonly known as: ATARAX Take 25 mg by mouth 2 (two) times daily.   Ingrezza 80 MG capsule Generic drug: valbenazine Take 80 mg by mouth at bedtime.   lamoTRIgine 100 MG tablet Commonly known as: LAMICTAL Take 100 mg by mouth daily.   levothyroxine 112 MCG tablet Commonly known as: SYNTHROID Take 112 mcg by mouth daily. On an empty stomach with a glass of water 30-60 minutes before breakfast   linaclotide 290 MCG Caps capsule Commonly known as: LINZESS Take 290 mcg by mouth daily.   LORazepam 0.5 MG tablet Commonly known as: ATIVAN Take 0.5-1 mg by mouth 2 (two) times daily. Take 1 tablet (0.5 mg) each morning and 2 tablets (1 mg) at bedtime   mirabegron ER 50 MG Tb24 tablet Commonly known as: MYRBETRIQ Take 1 tablet (50 mg total) by mouth daily. Started by: Carman ChingSamantha Elray Dains, PA-C  OLANZapine 15 MG tablet Commonly known as: ZYPREXA Take 30 mg by mouth at bedtime.   omeprazole 20 MG capsule Commonly known as: PRILOSEC Take 40 mg by mouth daily.   polyethylene glycol powder 17 GM/SCOOP powder Commonly known as: GLYCOLAX/MIRALAX Take 34 g by mouth daily. Mix with 8 ounces of water   simethicone 80 MG chewable tablet Commonly known as: MYLICON Chew 80 mg by mouth every 6 (six) hours as needed for flatulence.   sodium fluoride 1.1 % Crea dental cream Commonly known as: PREVIDENT 5000 PLUS Place 1 application onto teeth 2 (two) times daily.   sucralfate 1 g tablet Commonly known as: CARAFATE Take 1 g by mouth 2 (two) times  daily. Before meals   Systane Complete 0.6 % Soln Generic drug: Propylene Glycol Place 1 drop into both eyes 3 (three) times daily as needed for dry eyes.   topiramate 25 MG tablet Commonly known as: TOPAMAX Take 75 mg by mouth 2 (two) times daily.   triamcinolone cream 0.1 % Commonly known as: KENALOG Apply 1 application topically 2 (two) times daily as needed (for inflammation/irritation).        Allergies:  No Known Allergies  Family History: Family History  Problem Relation Age of Onset   Hypothyroidism Mother    Sarcoidosis Mother     Social History:   reports that he has never smoked. He has never used smokeless tobacco. He reports that he does not drink alcohol and does not use drugs.  Physical Exam: BP 108/72   Pulse 88   Ht 5\' 8"  (1.727 m)   Wt 187 lb (84.8 kg)   BMI 28.43 kg/m   Constitutional:  Alert and oriented, no acute distress, nontoxic appearing HEENT: [place], AT Cardiovascular: No clubbing, cyanosis, or edema Respiratory: Normal respiratory effort, no increased work of breathing Skin: No rashes, bruises or suspicious lesions Neurologic: Grossly intact, no focal deficits, moving all 4 extremities Psychiatric: Normal mood and affect  Laboratory Data: Results for orders placed or performed in visit on 09/25/21  Microscopic Examination   Urine  Result Value Ref Range   WBC, UA 0-5 0 - 5 /hpf   RBC 0-2 0 - 2 /hpf   Epithelial Cells (non renal) 0-10 0 - 10 /hpf   Mucus, UA Present (A) Not Estab.   Bacteria, UA None seen None seen/Few  Urinalysis, Complete  Result Value Ref Range   Specific Gravity, UA 1.025 1.005 - 1.030   pH, UA 6.5 5.0 - 7.5   Color, UA Yellow Yellow   Appearance Ur Clear Clear   Leukocytes,UA Negative Negative   Protein,UA Negative Negative/Trace   Glucose, UA Negative Negative   Ketones, UA 1+ (A) Negative   RBC, UA Negative Negative   Bilirubin, UA Negative Negative   Urobilinogen, Ur 2.0 (H) 0.2 - 1.0 mg/dL   Nitrite,  UA Negative Negative   Microscopic Examination See below:   Bladder Scan (Post Void Residual) in office  Result Value Ref Range   Scan Result 10    Assessment & Plan:   1. Urinary urgency He is emptying appropriately and UA is benign today.  We will start a trial of Myrbetriq 50 mg daily and plan to see him back in 4 weeks for symptom recheck and PVR.  Patient and caregiver are in agreement with this plan.  Samples provided today. - Urinalysis, Complete - Bladder Scan (Post Void Residual) in office - mirabegron ER (MYRBETRIQ) 50 MG TB24 tablet; Take 1 tablet (  50 mg total) by mouth daily.  Dispense: 28 tablet; Refill: 0  Return in about 4 weeks (around 10/23/2021) for Symptom recheck with PVR.  Carman Ching, PA-C  [facility] [address], [address] [place], [place] [address] [phone]

## 2021-10-23 ENCOUNTER — Ambulatory Visit (INDEPENDENT_AMBULATORY_CARE_PROVIDER_SITE_OTHER): Payer: Medicare HMO | Admitting: Physician Assistant

## 2021-10-23 ENCOUNTER — Encounter: Payer: Self-pay | Admitting: Physician Assistant

## 2021-10-23 VITALS — BP 105/71 | HR 83 | Ht 68.0 in | Wt 177.0 lb

## 2021-10-23 DIAGNOSIS — R3915 Urgency of urination: Secondary | ICD-10-CM | POA: Diagnosis not present

## 2021-10-23 LAB — BLADDER SCAN AMB NON-IMAGING: Scan Result: 51 ml

## 2021-10-23 MED ORDER — MIRABEGRON ER 50 MG PO TB24: 50.0000 mg | ORAL_TABLET | Freq: Every day | ORAL | 11 refills | Status: DC

## 2021-10-23 NOTE — Progress Notes (Signed)
10/23/2021 9:16 AM   Jared Mayer 02-24-1971 [id number]  CC: Chief Complaint  Patient presents with   Follow-up   HPI: Jared Mayer is a 51 y.o. male with developmental delay and schizoaffective disorder as well as LUTS including urgency, frequency, urge incontinence, and terminal dysuria who presents today for symptom recheck on Myrbetriq 50 mg daily.  He resides in a group home and is accompanied today by one of his caregivers, who contributes to HPI.  Today they report that his urinary symptoms have significantly improved on Myrbetriq.  He is having less urgency and frequency as well as fewer and lower volume leakage episodes.  He reports feeling better.  PVR 51 mL.  PMH: Past Medical History:  Diagnosis Date   Dyslipidemia    Hypothyroid    Manic behavior (HCC)    Mentally disabled    Schizoaffective disorder, bipolar type (HCC)     Surgical History: No past surgical history on file.  Home Medications:  Allergies as of 10/23/2021   No Known Allergies      Medication List        Accurate as of October 23, 2021  9:16 AM. If you have any questions, ask your nurse or doctor.          atorvastatin 40 MG tablet Commonly known as: LIPITOR Take 1 tablet (40 mg total) by mouth daily.   buPROPion 150 MG 24 hr tablet Commonly known as: WELLBUTRIN XL Take 300 mg by mouth daily.   chlorhexidine 0.12 % solution Commonly known as: PERIDEX 7.5 mLs by Mouth Rinse route 2 (two) times daily. After brushing teeth. Do not swallow or rinse with water or mouthwash after.   cholecalciferol 25 MCG (1000 UNIT) tablet Commonly known as: VITAMIN D3 Take 1,000 Units by mouth daily.   divalproex 500 MG 24 hr tablet Commonly known as: DEPAKOTE ER Take 500-2,000 mg by mouth See admin instructions. 500 mg every morning and 2000 mg at bedtime   docusate sodium 100 MG capsule Commonly known as: COLACE Take 100 mg by mouth 2 (two) times daily.   fexofenadine 180 MG  tablet Commonly known as: ALLEGRA Take 180 mg by mouth daily as needed for allergies.   Fish Oil 1000 MG Caps Take 1,000 mg by mouth 2 (two) times daily.   fluticasone 50 MCG/ACT nasal spray Commonly known as: FLONASE Place 2 sprays into the nose daily as needed for allergies or rhinitis.   glycopyrrolate 1 MG tablet Commonly known as: ROBINUL Take 1 mg by mouth 2 (two) times daily.   hydrALAZINE 25 MG tablet Commonly known as: APRESOLINE Take by mouth.   hydrOXYzine 25 MG tablet Commonly known as: ATARAX Take 25 mg by mouth 2 (two) times daily.   Ingrezza 80 MG capsule Generic drug: valbenazine Take 80 mg by mouth at bedtime.   ketoconazole 2 % cream Commonly known as: NIZORAL SMARTSIG:1 Topical Daily   lamoTRIgine 100 MG tablet Commonly known as: LAMICTAL Take 100 mg by mouth daily.   levothyroxine 112 MCG tablet Commonly known as: SYNTHROID Take 112 mcg by mouth daily. On an empty stomach with a glass of water 30-60 minutes before breakfast   linaclotide 290 MCG Caps capsule Commonly known as: LINZESS Take 290 mcg by mouth daily.   LORazepam 0.5 MG tablet Commonly known as: ATIVAN Take 0.5-1 mg by mouth 2 (two) times daily. Take 1 tablet (0.5 mg) each morning and 2 tablets (1 mg) at bedtime   mirabegron ER 50 MG  Tb24 tablet Commonly known as: MYRBETRIQ Take 1 tablet (50 mg total) by mouth daily.   OLANZapine 15 MG tablet Commonly known as: ZYPREXA Take 30 mg by mouth at bedtime.   omeprazole 40 MG capsule Commonly known as: PRILOSEC Take 40 mg by mouth daily.   PARoxetine 40 MG tablet Commonly known as: PAXIL Take 40 mg by mouth daily.   polyethylene glycol powder 17 GM/SCOOP powder Commonly known as: GLYCOLAX/MIRALAX Take 34 g by mouth daily. Mix with 8 ounces of water   simethicone 80 MG chewable tablet Commonly known as: MYLICON Chew 80 mg by mouth every 6 (six) hours as needed for flatulence.   sodium fluoride 1.1 % Crea dental  cream Commonly known as: PREVIDENT 5000 PLUS Place 1 application onto teeth 2 (two) times daily.   sucralfate 1 g tablet Commonly known as: CARAFATE Take 1 g by mouth 2 (two) times daily. Before meals   Systane Complete 0.6 % Soln Generic drug: Propylene Glycol Place 1 drop into both eyes 3 (three) times daily as needed for dry eyes.   topiramate 25 MG tablet Commonly known as: TOPAMAX Take 75 mg by mouth 2 (two) times daily.   triamcinolone cream 0.1 % Commonly known as: KENALOG Apply 1 application topically 2 (two) times daily as needed (for inflammation/irritation).        Allergies:  No Known Allergies  Family History: Family History  Problem Relation Age of Onset   Hypothyroidism Mother    Sarcoidosis Mother     Social History:   reports that he has never smoked. He has never used smokeless tobacco. He reports that he does not drink alcohol and does not use drugs.  Physical Exam: BP 105/71 (BP Location: Left Arm, Patient Position: Sitting, Cuff Size: Normal)   Pulse 83   Ht 5\' 8"  (1.727 m)   Wt 177 lb (80.3 kg)   BMI 26.91 kg/m   Constitutional:  Alert and oriented, no acute distress, nontoxic appearing HEENT: [place], AT Cardiovascular: No clubbing, cyanosis, or edema Respiratory: Normal respiratory effort, no increased work of breathing Skin: No rashes, bruises or suspicious lesions Neurologic: Grossly intact, no focal deficits, moving all 4 extremities Psychiatric: Normal mood and affect  Laboratory Data: Results for orders placed or performed in visit on 10/23/21  Bladder Scan (Post Void Residual) in office  Result Value Ref Range   Scan Result 51 ml   Assessment & Plan:   1. Urinary urgency OAB wet significantly improved on Myrbetriq 50 mg daily.  We will plan to continue this med and see him next year for symptom recheck. - Bladder Scan (Post Void Residual) in office - mirabegron ER (MYRBETRIQ) 50 MG TB24 tablet; Take 1 tablet (50 mg total) by mouth  daily.  Dispense: 30 tablet; Refill: 11  Return in about 1 year (around 10/24/2022) for Symptom recheck with PVR.  10/26/2022, PA-C  [facility] [address] [place], [place] [phone]

## 2021-11-12 ENCOUNTER — Emergency Department: Payer: Medicare HMO

## 2021-11-12 ENCOUNTER — Emergency Department
Admission: EM | Admit: 2021-11-12 | Discharge: 2021-11-12 | Disposition: A | Discharge status: Home / Self Care | Payer: Medicare HMO | Attending: Emergency Medicine | Admitting: Emergency Medicine

## 2021-11-12 ENCOUNTER — Encounter: Payer: Self-pay | Admitting: Medical Oncology

## 2021-11-12 DIAGNOSIS — W19XXXA Unspecified fall, initial encounter: Secondary | ICD-10-CM

## 2021-11-12 DIAGNOSIS — W01198A Fall on same level from slipping, tripping and stumbling with subsequent striking against other object, initial encounter: Secondary | ICD-10-CM | POA: Insufficient documentation

## 2021-11-12 DIAGNOSIS — E039 Hypothyroidism, unspecified: Secondary | ICD-10-CM | POA: Insufficient documentation

## 2021-11-12 DIAGNOSIS — S0993XA Unspecified injury of face, initial encounter: Secondary | ICD-10-CM | POA: Diagnosis present

## 2021-11-12 DIAGNOSIS — S01511A Laceration without foreign body of lip, initial encounter: Secondary | ICD-10-CM | POA: Diagnosis not present

## 2021-11-12 MED ORDER — LIDOCAINE HCL (PF) 1 % IJ SOLN
5.0000 mL | Freq: Once | INTRAMUSCULAR | Status: AC
  Administered 2021-11-12: 5 mL via INTRADERMAL
  Filled 2021-11-12: qty 5

## 2021-11-12 NOTE — ED Notes (Signed)
Pt in bed, mom with pt, mom verbalized understanding d/c and follow up, pt ambulatory from dpt with mom

## 2021-11-12 NOTE — ED Provider Triage Note (Signed)
Emergency Medicine Provider Triage Evaluation Note  Jared Mayer , a 51 y.o. male  was evaluated in triage.  Pt complains of trip and fall that occurred just prior to arrival. Chipped his tooth. No LOC. No vomiting. Tdap up to date.  Review of Systems  Positive: Lip laceration Negative: Headache, neck pain, vomiting  Physical Exam  There were no vitals taken for this visit. Gen:   Awake, no distress   Resp:  Normal effort  MSK:   Moves extremities without difficulty  Other:  Lip laceration bottom lip, chipped front tooth  Medical Decision Making  Medically screening exam initiated at 12:43 PM.  Appropriate orders placed.  Sandor Arboleda was informed that the remainder of the evaluation will be completed by another provider, this initial triage assessment does not replace that evaluation, and the importance of remaining in the ED until their evaluation is complete.     Jackelyn Hoehn, PA-C 11/12/21 1249

## 2021-11-12 NOTE — ED Notes (Signed)
Pt in bed, pt states that he tripped and fell, pt has lac to lower lip, bleeding is controlled/ stopped at this time

## 2021-11-12 NOTE — ED Triage Notes (Addendum)
Pt here with reports that he tripped and fell, has lac to bottom lip. Tooth chip is from previous.

## 2021-11-15 NOTE — ED Provider Notes (Signed)
[facility] Provider Note    Event Date/Time   First MD Initiated Contact with Patient 11/12/21 1252     (approximate)   History   Laceration   HPI  Jared Mayer is a 51 y.o. male presents to the emergency department for treatment and evaluation after falling and hitting his chin on the handrail.  No loss of consciousness.  Laceration to the bottom lip.  Past Medical History:  Diagnosis Date   Dyslipidemia    Hypothyroid    Manic behavior (HCC)    Mentally disabled    Schizoaffective disorder, bipolar type [facility])      Physical Exam   Triage Vital Signs: ED Triage Vitals  Enc Vitals Group     BP 11/12/21 1246 124/82     Pulse Rate 11/12/21 1246 78     Resp 11/12/21 1246 18     Temp 11/12/21 1246 97.7 F (36.5 C)     Temp Source 11/12/21 1246 Oral     SpO2 11/12/21 1246 99 %     Weight 11/12/21 1247 176 lb 5.9 oz (80 kg)     Height 11/12/21 1247 5\' 8"  (1.727 m)     Head Circumference --      Peak Flow --      Pain Score 11/12/21 1246 0     Pain Loc --      Pain Edu? --      Excl. in GC? --     Most recent vital signs: Vitals:   11/12/21 1246 11/12/21 1419  BP: 124/82 114/79  Pulse: 78 69  Resp: 18 17  Temp: 97.7 F (36.5 C) 97.7 F (36.5 C)  SpO2: 99% 98%    General: Awake, no distress. CV:  Good peripheral perfusion.  Resp:  Normal effort.  Abd:  No distention.  Other:  1 cm laceration that crosses the vermilion border on bottom lip.  Second laceration less than 1cm not crossing vermilion border.   ED Results / Procedures / Treatments   Labs (all labs ordered are listed, but only abnormal results are displayed) Labs Reviewed - No data to display   EKG     RADIOLOGY  Not indicated.  I have independently reviewed and interpreted imaging as well as reviewed report from radiology.  PROCEDURES:  Critical Care performed: No  ..Laceration Repair  Date/Time: 11/15/2021 5:01 PM  Performed by: 01/16/2022,  FNP Authorized by: Chinita Pester, FNP   Consent:    Consent obtained:  Verbal   Consent given by:  Patient and guardian   Risks discussed:  Pain, poor cosmetic result and poor wound healing Universal protocol:    Patient identity confirmed:  Verbally with patient Anesthesia:    Anesthesia method:  Local infiltration   Local anesthetic:  Lidocaine 1% w/o epi Laceration details:    Location:  Lip   Lip location:  Lower exterior lip   Length (cm):  1 Pre-procedure details:    Preparation:  Patient was prepped and draped in usual sterile fashion Exploration:    Wound exploration: entire depth of wound visualized   Treatment:    Area cleansed with:  Povidone-iodine and saline   Amount of cleaning:  Standard   Irrigation method:  Tap Skin repair:    Repair method:  Sutures   Suture size:  5-0   Suture material:  Fast-absorbing gut   Suture technique:  Simple interrupted   Number of sutures:  3 Approximation:  Approximation:  Close   Vermilion border well-aligned: yes   Repair type:    Repair type:  Simple Post-procedure details:    Procedure completion:  Tolerated well, no immediate complications    MEDICATIONS ORDERED IN ED:  Medications  lidocaine (PF) (XYLOCAINE) 1 % injection 5 mL (5 mLs Intradermal Given by Other 11/12/21 1357)     IMPRESSION / MDM / ASSESSMENT AND PLAN / ED COURSE   I reviewed the triage vital signs and the nursing notes.  Differential diagnosis includes, but is not limited to: lip laceration, dental injury  Patient's presentation is most consistent with acute, uncomplicated illness.  51 year old male presenting to the emergency department after mechanical, nonsyncopal fall at home.  See HPI.  Wound cleaned and repaired as described above.  Sutures should come out on their own in about a week but if not he was advised to see primary care.      FINAL CLINICAL IMPRESSION(S) / ED DIAGNOSES   Final diagnoses:  Lip laceration, initial  encounter  Fall, initial encounter     Rx / DC Orders   ED Discharge Orders     None        Note:  This document was prepared using Dragon voice recognition software and may include unintentional dictation errors.   Chinita Pester, FNP 11/15/21 1703    Chesley Noon, MD 11/18/21 2306

## 2021-12-10 IMAGING — CT CT HEAD W/O CM
4 series · 17 of 47 positions shown, 19 images · non-contrast
Comparison: Head CT report dated 12/25/1999.

CLINICAL DATA: Concern for intracranial hemorrhage.

EXAM:
CT HEAD WITHOUT CONTRAST
TECHNIQUE: Contiguous axial images were obtained from the base of the skull
through the vertex without intravenous contrast.

[Series 2: head wo · axial · 0.45mm/px · z∈[-146,-21]mm · 7 of 35 slices shown, 9 images]
[im 5/35  brain]
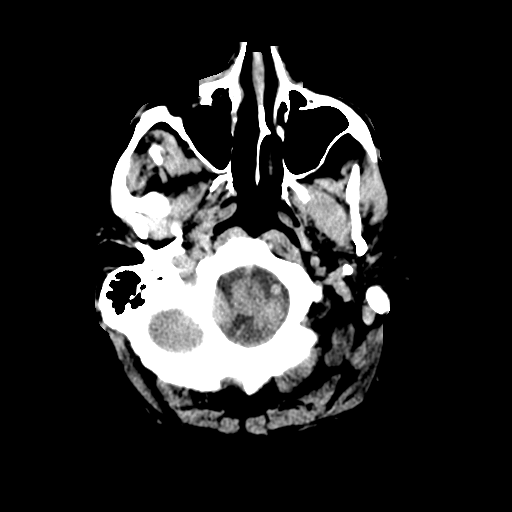
[im 5/35  bone]
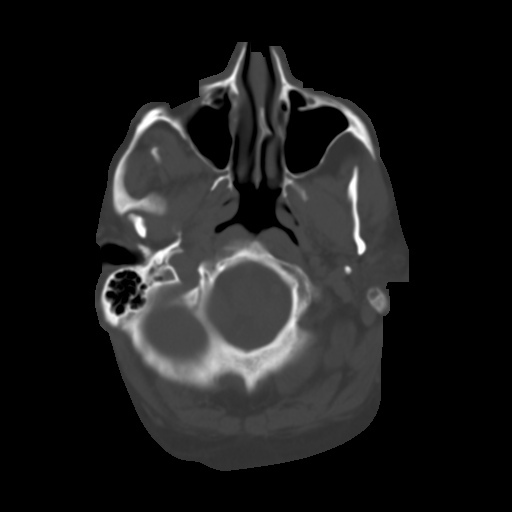
[im 9/35  brain]
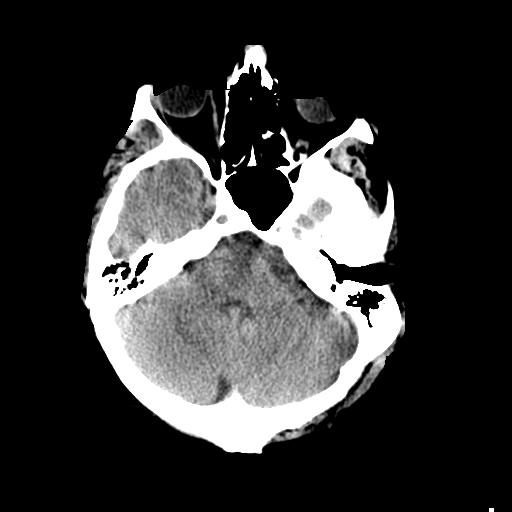
[im 13/35  brain]
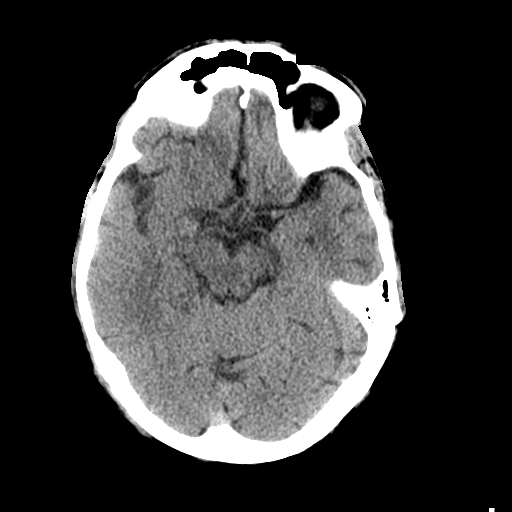
[im 18/35  brain]
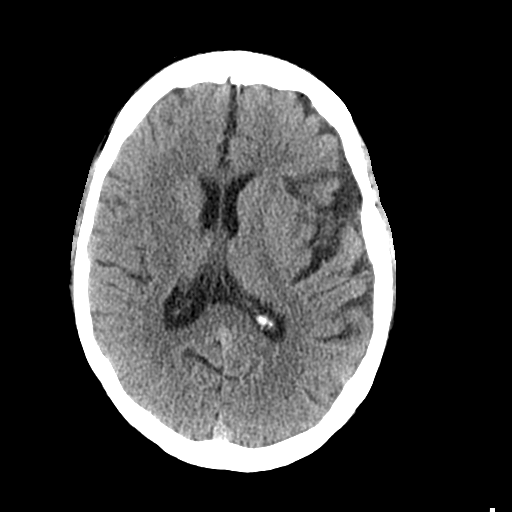
[im 22/35  brain]
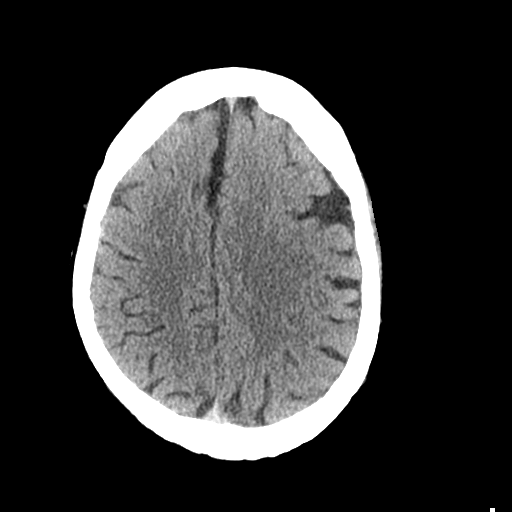
[im 22/35  bone]
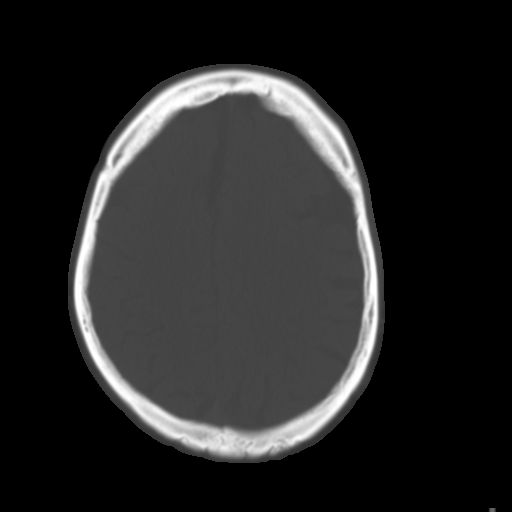
[im 26/35  brain]
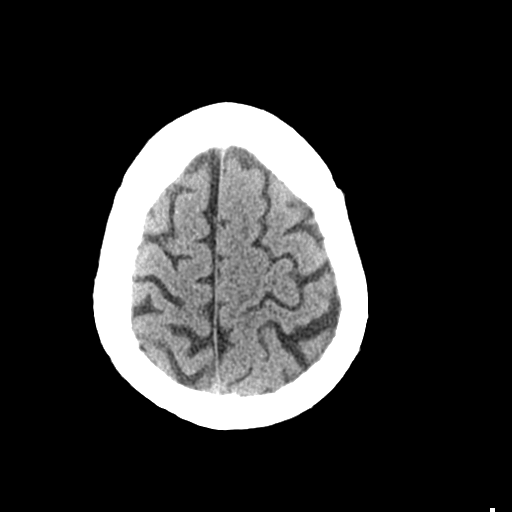
[im 30/35  brain]
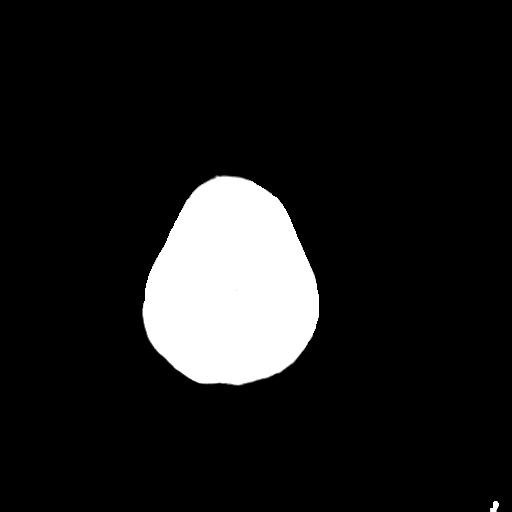

[Series 3: head bone · axial · 0.45mm/px · z∈[-150,-90]mm · 4 of 87 slices shown]
[im 9/87  bone]
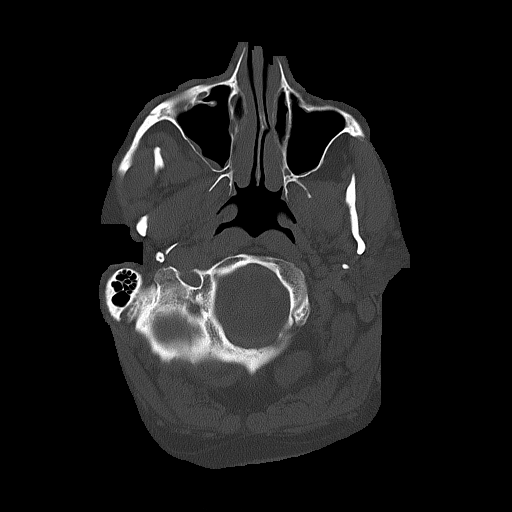
[im 18/87  bone]
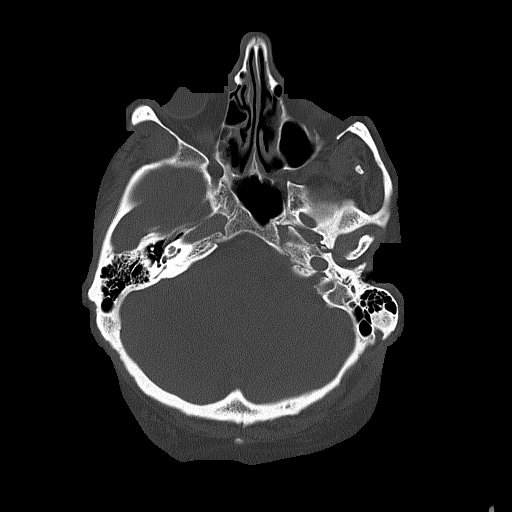
[im 26/87  bone]
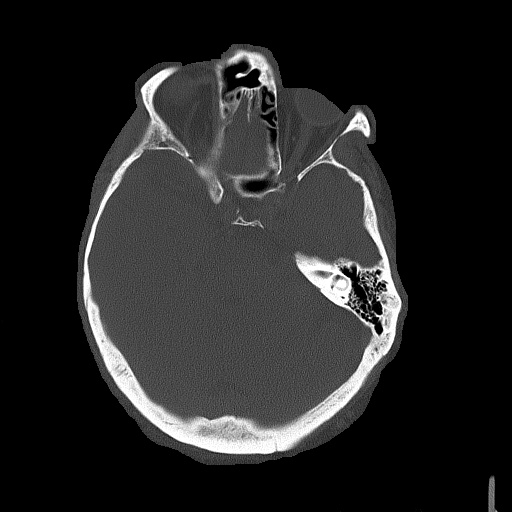
[im 39/87  bone]
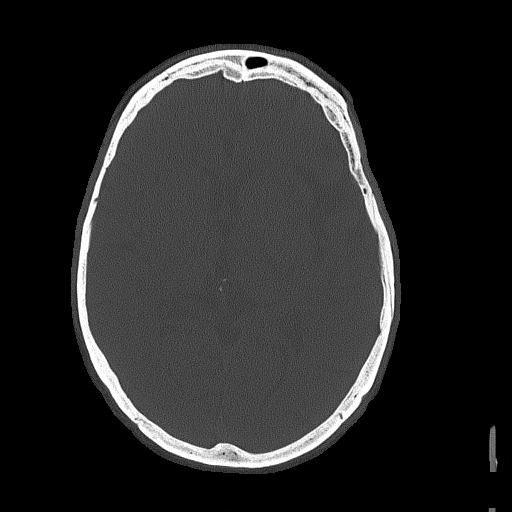

[Series 4: coronal soft tissue · coronal · 0.31mm/px · 3 of 70 slices shown]
[im 24/70  brain]
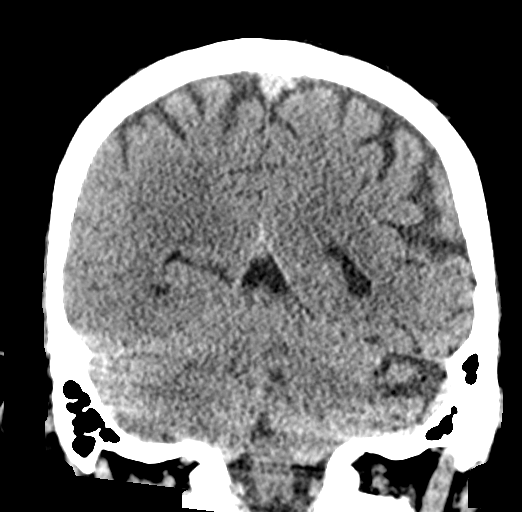
[im 31/70  brain]
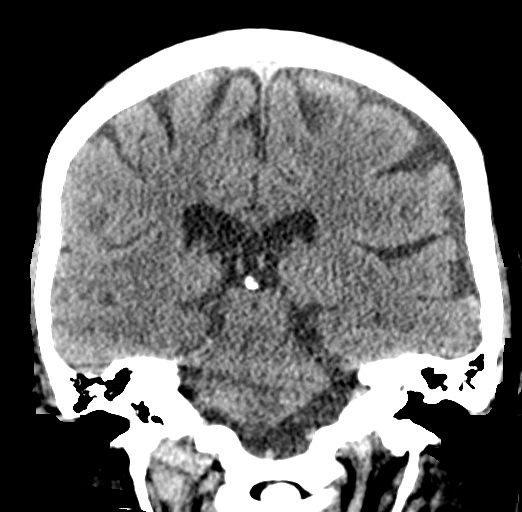
[im 39/70  brain]
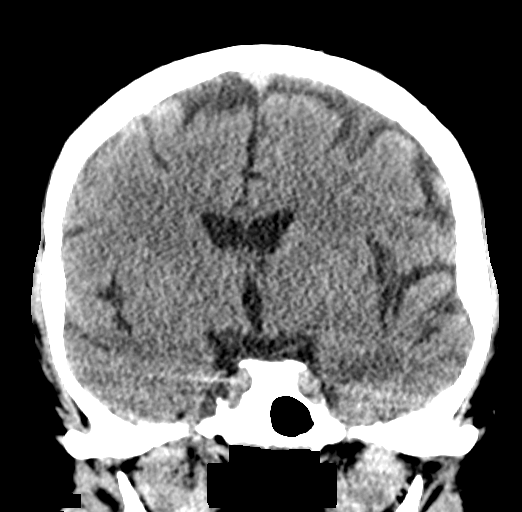

[Series 5: sagittal soft tissue · sagittal · 0.31mm/px · 3 of 55 slices shown]
[im 19/55  brain]
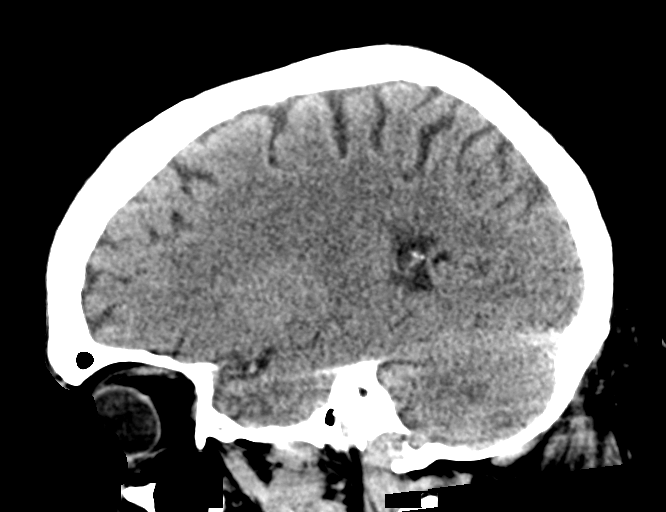
[im 28/55  brain]
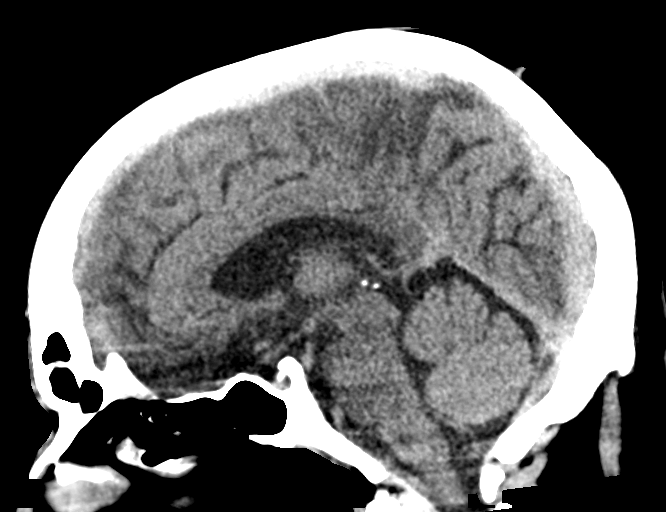
[im 37/55  brain]
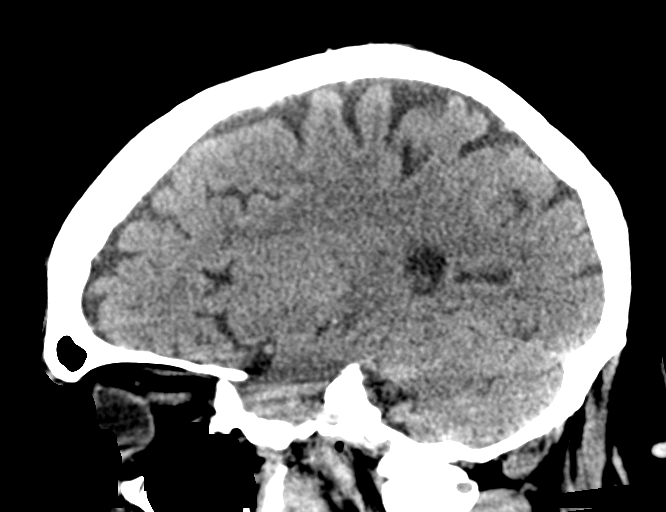

[17 of 47 positions shown; findings below may reference images not displayed]

FINDINGS: Brain: Mild asymmetric atrophy of the left cerebral cortex. The
ventricles are appropriate size for patient's age. The gray-white
matter discrimination is preserved. There is no acute intracranial
hemorrhage. No mass effect or midline shift. No extra-axial fluid
collection.

Vascular: No hyperdense vessel or unexpected calcification.

Skull: Normal. Negative for fracture or focal lesion.

Sinuses/Orbits: No acute finding.

Other: None
IMPRESSION: 1. No acute intracranial pathology.
2. Mild asymmetric atrophy of the left cerebral cortex.

## 2021-12-10 IMAGING — MR MR LUMBAR SPINE W/O CM
5 series · 31 of 48 positions shown · non-contrast
Comparison: None.

CLINICAL DATA: Inability to stand or wall

EXAM:
MRI LUMBAR SPINE WITHOUT CONTRAST
TECHNIQUE: Multiplanar, multisequence MR imaging of the lumbar spine was
performed. No intravenous contrast was administered.

[Series 26: T2 · sagittal · 4.0mm · 0.81mm/px · 6 of 17 slices shown (1 of 2)]
[im 1/17]
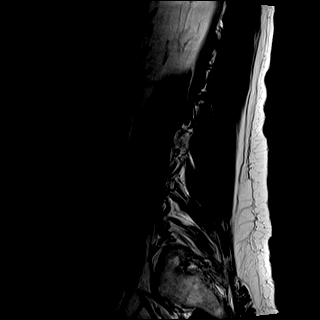
[im 4/17]
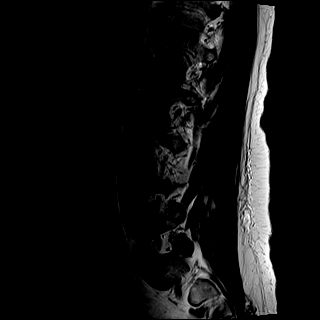
[im 7/17]
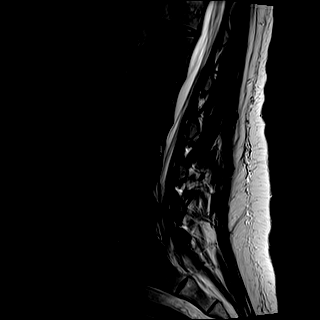
[im 10/17]
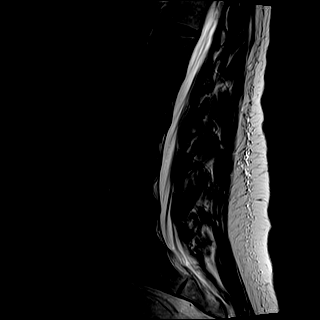
[im 13/17]
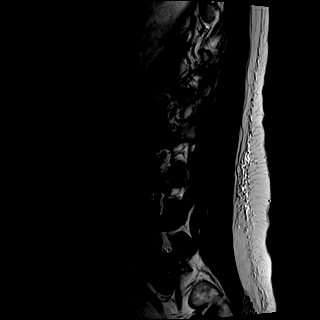
[im 17/17]
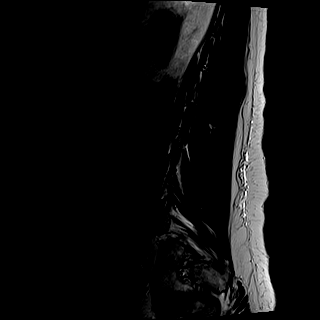

[Series 27: T1 · sagittal · 4.0mm · 0.81mm/px · 6 of 17 slices shown (1 of 2)]
[im 1/17]
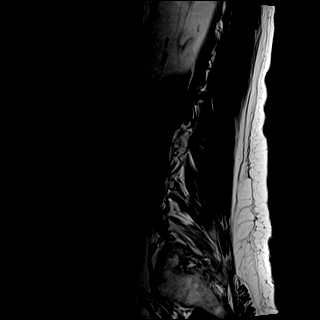
[im 4/17]
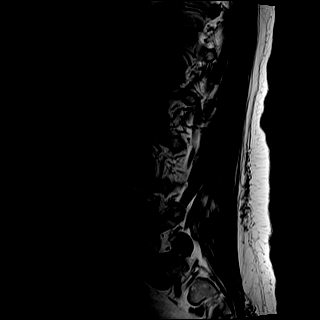
[im 7/17]
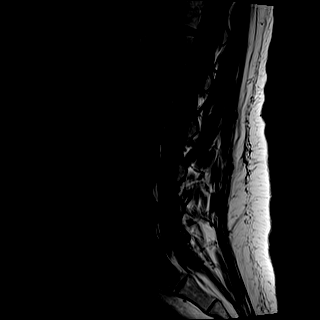
[im 10/17]
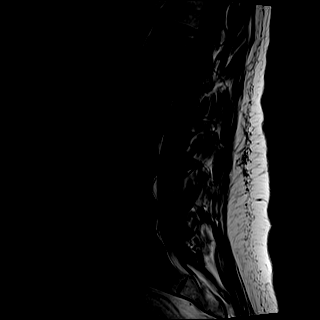
[im 13/17]
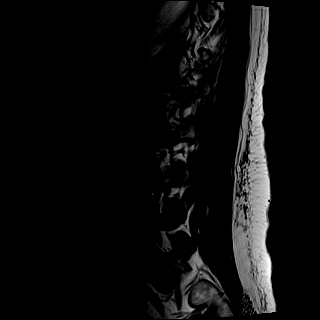
[im 17/17]
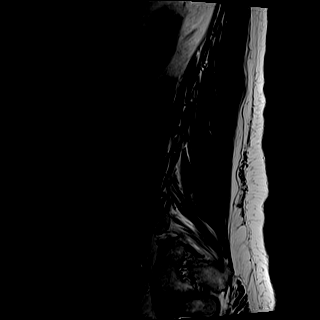

[Series 28: STIR · sagittal · 4.0mm · 0.41mm/px · 1 of 17 slices shown]
[im 1/17]
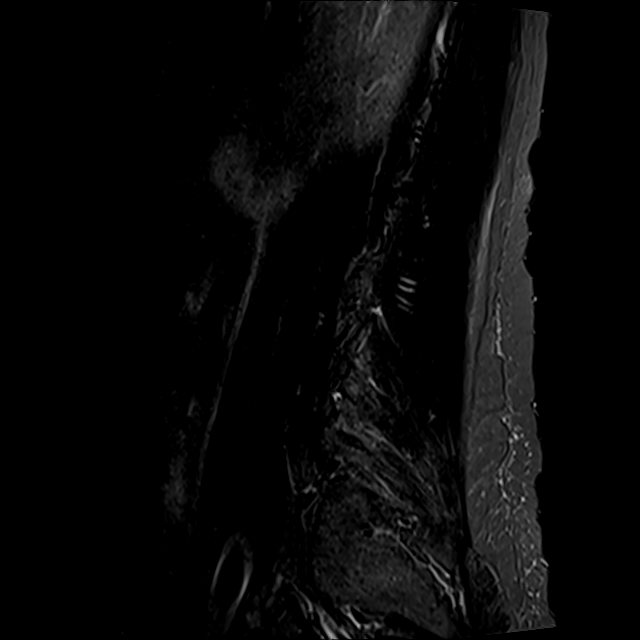

[Series 29: T2 · axial · 4.0mm · 0.78mm/px · z∈[-688,-455]mm · 9 of 40 slices shown (2 of 2)]
[im 1/40]
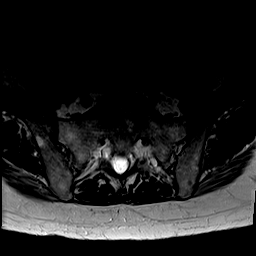
[im 6/40]
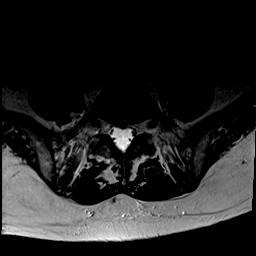
[im 12/40]
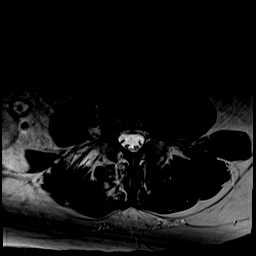
[im 17/40]
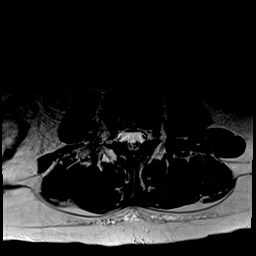
[im 20/40]
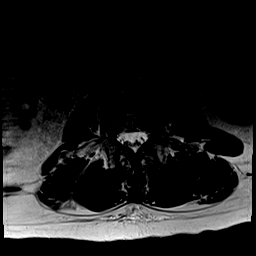
[im 23/40]
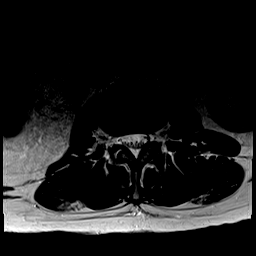
[im 28/40]
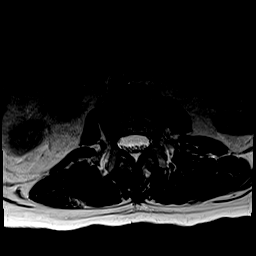
[im 34/40]
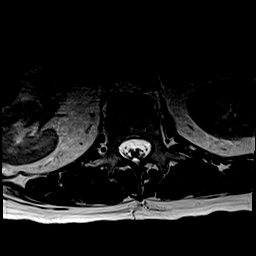
[im 40/40]
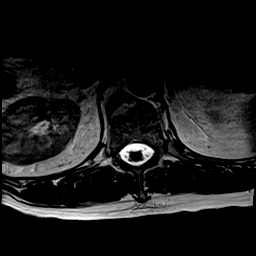

[Series 30: T1 · axial · 4.0mm · 0.39mm/px · z∈[-688,-455]mm · 9 of 40 slices shown (2 of 2)]
[im 1/40]
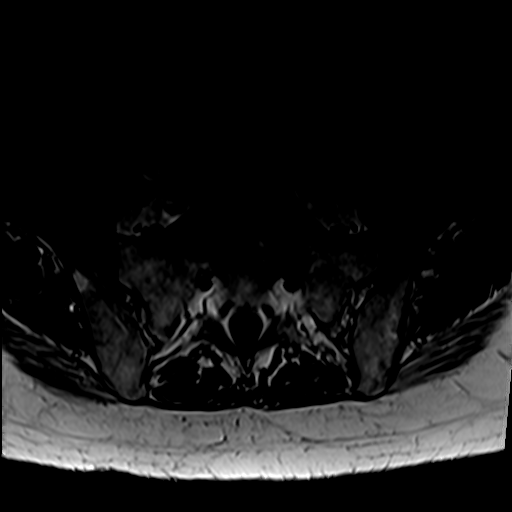
[im 6/40]
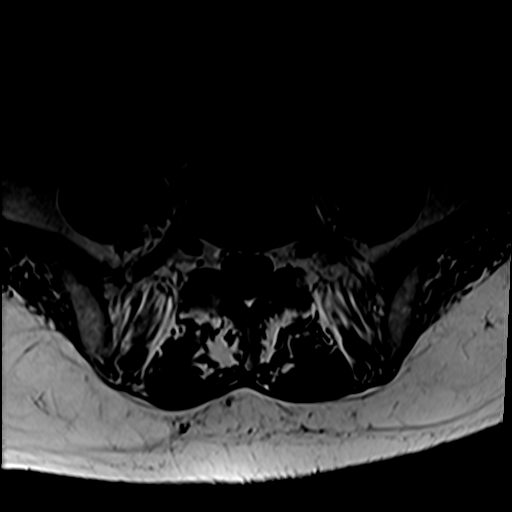
[im 12/40]
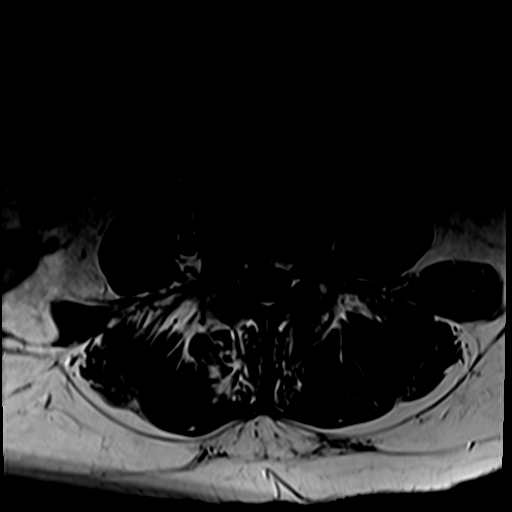
[im 17/40]
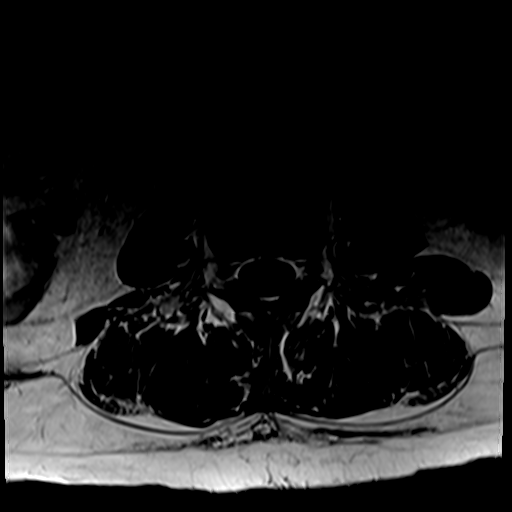
[im 20/40]
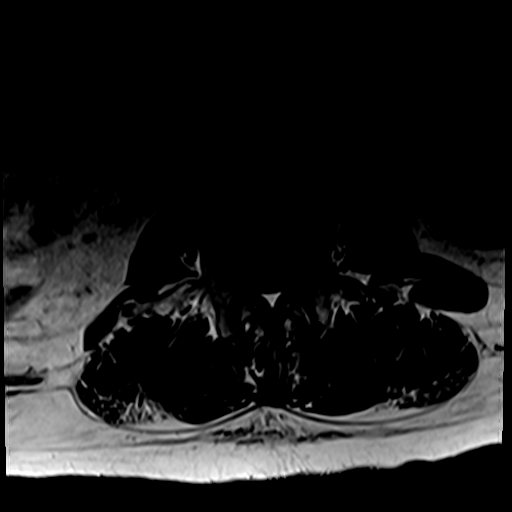
[im 23/40]
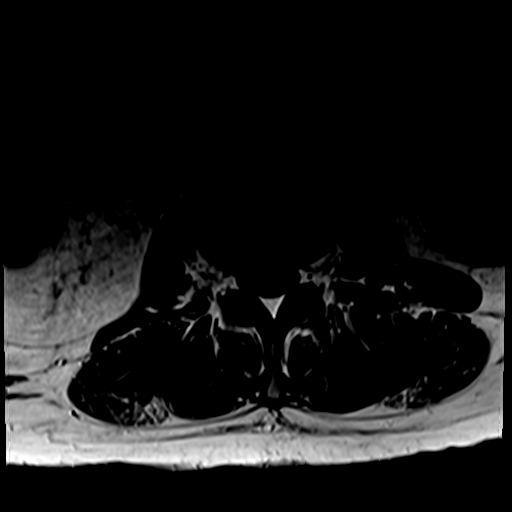
[im 28/40]
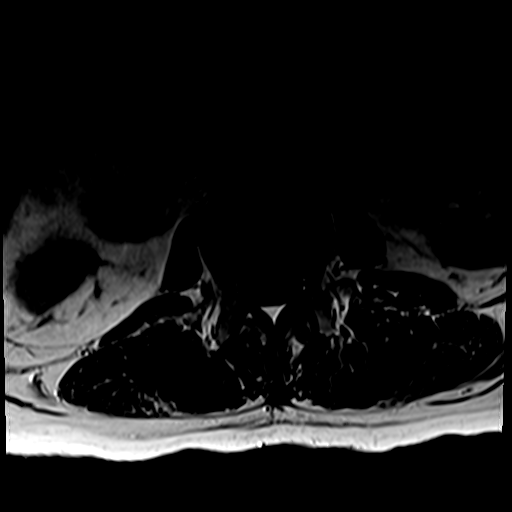
[im 34/40]
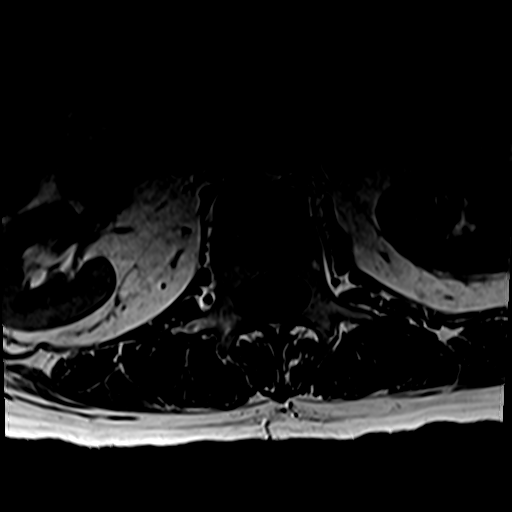
[im 40/40]
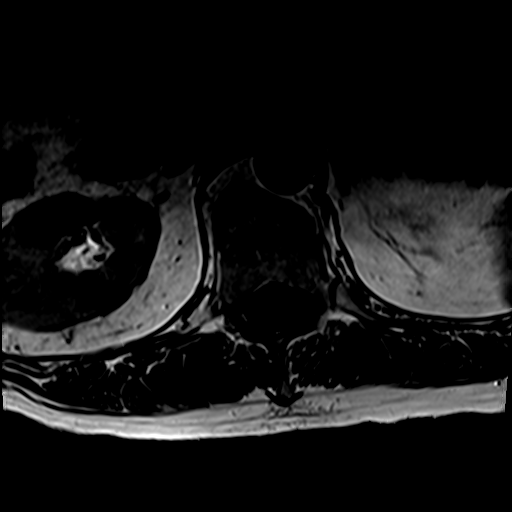

[31 of 48 positions shown; findings below may reference images not displayed]

FINDINGS: Segmentation:  Standard

Alignment:  Normal

Vertebrae:  No fracture, evidence of discitis, or bone lesion.

Conus medullaris and cauda equina: Conus extends to the L1 level.
Conus and cauda equina appear normal.

Paraspinal and other soft tissues: Negative.

Disc levels:

L1-L2: Normal disc space and facet joints. No spinal canal stenosis.
No neural foraminal stenosis.

L2-L3: Small central annular fissure without herniation. No spinal
canal stenosis. No neural foraminal stenosis.

L3-L4: Normal disc space and facet joints. No spinal canal stenosis.
No neural foraminal stenosis.

L4-L5: Small disc bulge. No spinal canal stenosis. No neural
foraminal stenosis.

L5-S1: Normal disc space and facet joints. No spinal canal stenosis.
No neural foraminal stenosis.

Visualized sacrum: Normal.
IMPRESSION: 1. No acute abnormality of the lumbar spine.
2. Mild lumbar degenerative disc disease without spinal canal or
neural foraminal stenosis.

## 2022-09-24 ENCOUNTER — Encounter: Payer: Self-pay | Admitting: Emergency Medicine

## 2022-09-24 ENCOUNTER — Emergency Department
Admission: EM | Admit: 2022-09-24 | Discharge: 2022-09-24 | Disposition: A | Discharge status: Home / Self Care | Payer: Medicare HMO | Attending: Emergency Medicine | Admitting: Emergency Medicine

## 2022-09-24 DIAGNOSIS — M25552 Pain in left hip: Secondary | ICD-10-CM | POA: Diagnosis not present

## 2022-09-24 DIAGNOSIS — W19XXXA Unspecified fall, initial encounter: Secondary | ICD-10-CM | POA: Insufficient documentation

## 2022-09-24 DIAGNOSIS — R109 Unspecified abdominal pain: Secondary | ICD-10-CM | POA: Diagnosis not present

## 2022-09-24 DIAGNOSIS — M25551 Pain in right hip: Secondary | ICD-10-CM | POA: Insufficient documentation

## 2022-09-24 DIAGNOSIS — M545 Low back pain, unspecified: Secondary | ICD-10-CM | POA: Insufficient documentation

## 2022-09-24 DIAGNOSIS — Y92199 Unspecified place in other specified residential institution as the place of occurrence of the external cause: Secondary | ICD-10-CM | POA: Insufficient documentation

## 2022-09-24 LAB — CBC
HCT: 45.4 % (ref 39.0–52.0)
Hemoglobin: 14.8 g/dL (ref 13.0–17.0)
MCH: 32.5 pg (ref 26.0–34.0)
MCHC: 32.6 g/dL (ref 30.0–36.0)
MCV: 99.6 fL (ref 80.0–100.0)
Platelets: 195 10*3/uL (ref 150–400)
RBC: 4.56 MIL/uL (ref 4.22–5.81)
RDW: 14.6 % (ref 11.5–15.5)
WBC: 3.7 10*3/uL — ABNORMAL LOW (ref 4.0–10.5)
nRBC: 0 % (ref 0.0–0.2)

## 2022-09-24 LAB — URINALYSIS, ROUTINE W REFLEX MICROSCOPIC
Bilirubin Urine: NEGATIVE
Glucose, UA: NEGATIVE mg/dL
Hgb urine dipstick: NEGATIVE
Ketones, ur: NEGATIVE mg/dL
Leukocytes,Ua: NEGATIVE
Nitrite: NEGATIVE
Protein, ur: NEGATIVE mg/dL
Specific Gravity, Urine: 1.013 (ref 1.005–1.030)
pH: 6 (ref 5.0–8.0)

## 2022-09-24 LAB — BASIC METABOLIC PANEL
Anion gap: 8 (ref 5–15)
BUN: 17 mg/dL (ref 6–20)
CO2: 27 mmol/L (ref 22–32)
Calcium: 9.1 mg/dL (ref 8.9–10.3)
Chloride: 102 mmol/L (ref 98–111)
Creatinine, Ser: 0.89 mg/dL (ref 0.61–1.24)
GFR, Estimated: >60 mL/min (ref >60)
Glucose, Bld: 90 mg/dL (ref 70–99)
Potassium: 4.2 mmol/L (ref 3.5–5.1)
Sodium: 137 mmol/L (ref 135–145)

## 2022-09-24 LAB — HEPATIC FUNCTION PANEL
ALT: 22 U/L (ref 0–44)
AST: 28 U/L (ref 15–41)
Albumin: 3.6 g/dL (ref 3.5–5.0)
Alkaline Phosphatase: 60 U/L (ref 38–126)
Bilirubin, Direct: <0.1 mg/dL (ref 0.0–0.2)
Total Bilirubin: 0.6 mg/dL (ref 0.3–1.2)
Total Protein: 6.8 g/dL (ref 6.5–8.1)

## 2022-09-24 LAB — VALPROIC ACID LEVEL: Valproic Acid Lvl: 81 ug/mL (ref 50.0–100.0)

## 2022-09-24 NOTE — ED Provider Notes (Signed)
[facility] Provider Note    Event Date/Time   First MD Initiated Contact with Patient 09/24/22 1634     (approximate)   History   Fall   HPI  Salem Laskoski is a 52 y.o. male  who presents to the emergency department today because of concern for falls. Most history is obtained from family in the room. They state that they were told he had been having falls today and was complaining of pain in the hips and low back. However when family has been with him he has been able to ambulate without any falls. The patient complains primarily of pain in his abdomen on my exam. Denies any difficulty with eating or drinking recently.     Physical Exam   Triage Vital Signs: ED Triage Vitals [09/24/22 1619]  Enc Vitals Group     BP 97/76     Pulse Rate 88     Resp 18     Temp 98 F (36.7 C)     Temp Source Oral     SpO2 94 %     Weight      Height      Head Circumference      Peak Flow      Pain Score      Pain Loc      Pain Edu?      Excl. in GC?     Most recent vital signs: Vitals:   09/24/22 1619  BP: 97/76  Pulse: 88  Resp: 18  Temp: 98 F (36.7 C)  SpO2: 94%   General: Awake, alert. CV:  Good peripheral perfusion. Regular rate and rhythm. Resp:  Normal effort. Lungs clear. Abd:  No distention. Non tender.   ED Results / Procedures / Treatments   Labs (all labs ordered are listed, but only abnormal results are displayed) Labs Reviewed  URINALYSIS, ROUTINE W REFLEX MICROSCOPIC - Abnormal; Notable for the following components:      Result Value   Color, Urine YELLOW (*)    APPearance CLEAR (*)    All other components within normal limits  CBC - Abnormal; Notable for the following components:   WBC 3.7 (*)    All other components within normal limits  BASIC METABOLIC PANEL  HEPATIC FUNCTION PANEL  VALPROIC ACID LEVEL     EKG  None   RADIOLOGY None    PROCEDURES:  Critical Care performed: No   MEDICATIONS ORDERED IN  ED: Medications - No data to display   IMPRESSION / MDM / ASSESSMENT AND PLAN / ED COURSE  I reviewed the triage vital signs and the nursing notes.                              Differential diagnosis includes, but is not limited to, dehydration, electrolyte abnormality, infection, drug interaction  Patient's presentation is most consistent with acute presentation with potential threat to life or bodily function.   Patient presented to the emergency department today from living facility because of concerns for increased falls and some behavioral concerns.  However family who is at bedside states he has been ambulating okay from them today and they have not noticed any abnormal behavior.  Workup here without concerning findings of electrolyte abnormalities, leukocytosis or signs of urinary tract infection.  Depakote was also checked and was not elevated.  Given that patient appears to be at baseline I think is reasonable for patient  be discharged back to facility.   FINAL CLINICAL IMPRESSION(S) / ED DIAGNOSES   Final diagnoses:  Fall, initial encounter    Note:  This document was prepared using Dragon voice recognition software and may include unintentional dictation errors.    Phineas Semen, MD 09/24/22 2015
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
## 2022-09-24 NOTE — Discharge Instructions (Signed)
Please have Jared Mayer be seen for any high fevers, further falls, increasing or worsening abdominal pain, persistent vomiting, bloody stools or any other new or concerning symptoms.

## 2022-10-26 ENCOUNTER — Ambulatory Visit (INDEPENDENT_AMBULATORY_CARE_PROVIDER_SITE_OTHER): Payer: Medicare HMO | Admitting: Physician Assistant

## 2022-10-26 ENCOUNTER — Encounter: Payer: Self-pay | Admitting: Physician Assistant

## 2022-10-26 VITALS — BP 104/73 | HR 101 | Ht 68.0 in | Wt 180.0 lb

## 2022-10-26 DIAGNOSIS — R3915 Urgency of urination: Secondary | ICD-10-CM | POA: Diagnosis not present

## 2022-10-26 LAB — BLADDER SCAN AMB NON-IMAGING: Scan Result: 0

## 2022-10-26 MED ORDER — MIRABEGRON ER 50 MG PO TB24: 50.0000 mg | ORAL_TABLET | Freq: Every day | ORAL | 11 refills | Status: DC

## 2022-10-26 NOTE — Progress Notes (Signed)
10/26/2022 9:23 AM   Jared Mayer Jul 06, 1970 [id number]  CC: Chief Complaint  Patient presents with   Urinary Urgency   HPI: Jared Mayer is a 52 y.o. male with developmental delay and schizoaffective disorder and OAB wet on Myrbetriq 50 mg who presents today for annual follow-up.  He is accompanied today by staff from his group home, who contributes to HPI.  Today he reports no acute concerns today.  His symptoms are well-managed on Myrbetriq.  No dysuria, gross hematuria, or flank pain.    He was treated for a UTI last month, however this is the only urinary infection he has had within the past year.  They report his symptoms were agitation, unclear urologic symptoms and labs unavailable for review.  He was seen in the ED on 09/24/2022 after being treated for UTI with continued behavioral issues and UA at that time was bland.  PVR 0 mL.  PMH: Past Medical History:  Diagnosis Date   Dyslipidemia    Hypothyroid    Manic behavior (HCC)    Mentally disabled    Schizoaffective disorder, bipolar type (HCC)     Surgical History: History reviewed. No pertinent surgical history.  Home Medications:  Allergies as of 10/26/2022   No Known Allergies      Medication List        Accurate as of October 26, 2022  9:23 AM. If you have any questions, ask your nurse or doctor.          atorvastatin 40 MG tablet Commonly known as: LIPITOR Take 1 tablet (40 mg total) by mouth daily.   buPROPion 150 MG 24 hr tablet Commonly known as: WELLBUTRIN XL Take 300 mg by mouth daily.   chlorhexidine 0.12 % solution Commonly known as: PERIDEX 7.5 mLs by Mouth Rinse route 2 (two) times daily. After brushing teeth. Do not swallow or rinse with water or mouthwash after.   cholecalciferol 25 MCG (1000 UNIT) tablet Commonly known as: VITAMIN D3 Take 1,000 Units by mouth daily.   divalproex 500 MG 24 hr tablet Commonly known as: DEPAKOTE ER Take 500-2,000 mg by mouth See admin  instructions. 500 mg every morning and 2000 mg at bedtime   docusate sodium 100 MG capsule Commonly known as: COLACE Take 100 mg by mouth 2 (two) times daily.   fexofenadine 180 MG tablet Commonly known as: ALLEGRA Take 180 mg by mouth daily as needed for allergies.   Fish Oil 1000 MG Caps Take 1,000 mg by mouth 2 (two) times daily.   fluticasone 50 MCG/ACT nasal spray Commonly known as: FLONASE Place 2 sprays into the nose daily as needed for allergies or rhinitis.   glycopyrrolate 1 MG tablet Commonly known as: ROBINUL Take 1 mg by mouth 2 (two) times daily.   hydrALAZINE 25 MG tablet Commonly known as: APRESOLINE Take by mouth.   hydrOXYzine 25 MG tablet Commonly known as: ATARAX Take 25 mg by mouth 2 (two) times daily.   Ingrezza 80 MG capsule Generic drug: valbenazine Take 80 mg by mouth at bedtime.   ketoconazole 2 % cream Commonly known as: NIZORAL SMARTSIG:1 Topical Daily   lamoTRIgine 100 MG tablet Commonly known as: LAMICTAL Take 100 mg by mouth daily.   levothyroxine 112 MCG tablet Commonly known as: SYNTHROID Take 112 mcg by mouth daily. On an empty stomach with a glass of water 30-60 minutes before breakfast   linaclotide 290 MCG Caps capsule Commonly known as: LINZESS Take 290 mcg by mouth daily.  LORazepam 0.5 MG tablet Commonly known as: ATIVAN Take 0.5-1 mg by mouth 2 (two) times daily. Take 1 tablet (0.5 mg) each morning and 2 tablets (1 mg) at bedtime   mirabegron ER 50 MG Tb24 tablet Commonly known as: MYRBETRIQ Take 1 tablet (50 mg total) by mouth daily.   OLANZapine 15 MG tablet Commonly known as: ZYPREXA Take 30 mg by mouth at bedtime.   omeprazole 40 MG capsule Commonly known as: PRILOSEC Take 40 mg by mouth daily.   PARoxetine 40 MG tablet Commonly known as: PAXIL Take 40 mg by mouth daily.   polyethylene glycol powder 17 GM/SCOOP powder Commonly known as: GLYCOLAX/MIRALAX Take 34 g by mouth daily. Mix with 8 ounces of  water   simethicone 80 MG chewable tablet Commonly known as: MYLICON Chew 80 mg by mouth every 6 (six) hours as needed for flatulence.   sodium fluoride 1.1 % Crea dental cream Commonly known as: PREVIDENT 5000 PLUS Place 1 application onto teeth 2 (two) times daily.   sucralfate 1 g tablet Commonly known as: CARAFATE Take 1 g by mouth 2 (two) times daily. Before meals   Systane Complete 0.6 % Soln Generic drug: Propylene Glycol Place 1 drop into both eyes 3 (three) times daily as needed for dry eyes.   topiramate 25 MG tablet Commonly known as: TOPAMAX Take 75 mg by mouth 2 (two) times daily.   triamcinolone cream 0.1 % Commonly known as: KENALOG Apply 1 application topically 2 (two) times daily as needed (for inflammation/irritation).        Allergies:  No Known Allergies  Family History: Family History  Problem Relation Age of Onset   Hypothyroidism Mother    Sarcoidosis Mother     Social History:   reports that he has never smoked. He has never used smokeless tobacco. He reports that he does not drink alcohol and does not use drugs.  Physical Exam: BP 104/73   Pulse (!) 101   Ht 5\' 8"  (1.727 m)   Wt 180 lb (81.6 kg)   BMI 27.37 kg/m   Constitutional:  Alert, no acute distress, nontoxic appearing HEENT: [place], AT Cardiovascular: No clubbing, cyanosis, or edema Respiratory: Normal respiratory effort, no increased work of breathing Skin: No rashes, bruises or suspicious lesions Neurologic: Grossly intact, no focal deficits, moving all 4 extremities Psychiatric: Normal mood and affect  Laboratory Data: Results for orders placed or performed in visit on 10/26/22  Bladder Scan (Post Void Residual) in office  Result Value Ref Range   Scan Result 0    Assessment & Plan:   1. Urinary urgency Well-managed on Myrbetriq, will continue this.  He is emptying appropriately.  Will see him back next year, or sooner if he develops more frequent UTIs or gross  hematuria. - Bladder Scan (Post Void Residual) in office - mirabegron ER (MYRBETRIQ) 50 MG TB24 tablet; Take 1 tablet (50 mg total) by mouth daily.  Dispense: 30 tablet; Refill: 11  Return in about 1 year (around 10/26/2023) for Annual OAB f/u with P[facility].  Carman Ching, PA-C  [facility] [place] [address], [address] [place], [place] [address] [phone]

## 2022-11-02 ENCOUNTER — Other Ambulatory Visit: Payer: Self-pay | Admitting: Neurology

## 2022-11-02 DIAGNOSIS — G2119 Other drug induced secondary parkinsonism: Secondary | ICD-10-CM

## 2022-11-04 ENCOUNTER — Encounter: Payer: Self-pay | Admitting: Neurology

## 2022-11-06 ENCOUNTER — Ambulatory Visit
Admission: RE | Admit: 2022-11-06 | Discharge: 2022-11-06 | Disposition: A | Discharge status: Home / Self Care | Payer: Medicare HMO | Source: Ambulatory Visit | Attending: Neurology | Admitting: Neurology

## 2022-11-06 DIAGNOSIS — G2119 Other drug induced secondary parkinsonism: Secondary | ICD-10-CM

## 2023-04-03 ENCOUNTER — Emergency Department: Payer: Medicare HMO

## 2023-04-03 ENCOUNTER — Other Ambulatory Visit: Payer: Self-pay

## 2023-04-03 ENCOUNTER — Emergency Department
Admission: EM | Admit: 2023-04-03 | Discharge: 2023-04-04 | Disposition: A | Discharge status: Home / Self Care | Payer: Medicare HMO | Attending: Emergency Medicine | Admitting: Emergency Medicine

## 2023-04-03 DIAGNOSIS — R456 Violent behavior: Secondary | ICD-10-CM | POA: Diagnosis not present

## 2023-04-03 DIAGNOSIS — F02811 Dementia in other diseases classified elsewhere, unspecified severity, with agitation: Secondary | ICD-10-CM

## 2023-04-03 DIAGNOSIS — Z79899 Other long term (current) drug therapy: Secondary | ICD-10-CM | POA: Insufficient documentation

## 2023-04-03 DIAGNOSIS — F250 Schizoaffective disorder, bipolar type: Secondary | ICD-10-CM | POA: Diagnosis present

## 2023-04-03 DIAGNOSIS — F70 Mild intellectual disabilities: Secondary | ICD-10-CM | POA: Diagnosis not present

## 2023-04-03 DIAGNOSIS — R4689 Other symptoms and signs involving appearance and behavior: Secondary | ICD-10-CM | POA: Diagnosis not present

## 2023-04-03 DIAGNOSIS — E039 Hypothyroidism, unspecified: Secondary | ICD-10-CM | POA: Diagnosis not present

## 2023-04-03 DIAGNOSIS — F0390 Unspecified dementia without behavioral disturbance: Secondary | ICD-10-CM | POA: Insufficient documentation

## 2023-04-03 DIAGNOSIS — G3183 Dementia with Lewy bodies: Secondary | ICD-10-CM | POA: Diagnosis not present

## 2023-04-03 DIAGNOSIS — G20C Parkinsonism, unspecified: Secondary | ICD-10-CM | POA: Diagnosis not present

## 2023-04-03 HISTORY — DX: Unspecified dementia, unspecified severity, without behavioral disturbance, psychotic disturbance, mood disturbance, and anxiety: F03.90

## 2023-04-03 LAB — COMPREHENSIVE METABOLIC PANEL
ALT: 22 U/L (ref 0–44)
AST: 28 U/L (ref 15–41)
Albumin: 3.5 g/dL (ref 3.5–5.0)
Alkaline Phosphatase: 72 U/L (ref 38–126)
Anion gap: 8 (ref 5–15)
BUN: 23 mg/dL — ABNORMAL HIGH (ref 6–20)
CO2: 25 mmol/L (ref 22–32)
Calcium: 8.4 mg/dL — ABNORMAL LOW (ref 8.9–10.3)
Chloride: 104 mmol/L (ref 98–111)
Creatinine, Ser: 0.72 mg/dL (ref 0.61–1.24)
GFR, Estimated: >60 mL/min (ref >60)
Glucose, Bld: 92 mg/dL (ref 70–99)
Potassium: 3.6 mmol/L (ref 3.5–5.1)
Sodium: 137 mmol/L (ref 135–145)
Total Bilirubin: 0.6 mg/dL (ref <1.2)
Total Protein: 6.6 g/dL (ref 6.5–8.1)

## 2023-04-03 LAB — ETHANOL: Alcohol, Ethyl (B): <10 mg/dL (ref <10)

## 2023-04-03 LAB — CBC
HCT: 40 % (ref 39.0–52.0)
Hemoglobin: 13.4 g/dL (ref 13.0–17.0)
MCH: 32.1 pg (ref 26.0–34.0)
MCHC: 33.5 g/dL (ref 30.0–36.0)
MCV: 95.7 fL (ref 80.0–100.0)
Platelets: 254 10*3/uL (ref 150–400)
RBC: 4.18 MIL/uL — ABNORMAL LOW (ref 4.22–5.81)
RDW: 14.1 % (ref 11.5–15.5)
WBC: 5.3 10*3/uL (ref 4.0–10.5)
nRBC: 0 % (ref 0.0–0.2)

## 2023-04-03 LAB — ACETAMINOPHEN LEVEL: Acetaminophen (Tylenol), Serum: <10 ug/mL — ABNORMAL LOW (ref 10–30)

## 2023-04-03 LAB — URINE DRUG SCREEN, QUALITATIVE (ARMC ONLY)
Amphetamines, Ur Screen: NOT DETECTED
Barbiturates, Ur Screen: NOT DETECTED
Benzodiazepine, Ur Scrn: POSITIVE — AB
Cannabinoid 50 Ng, Ur ~~LOC~~: NOT DETECTED
Cocaine Metabolite,Ur ~~LOC~~: NOT DETECTED
MDMA (Ecstasy)Ur Screen: NOT DETECTED
Methadone Scn, Ur: NOT DETECTED
Opiate, Ur Screen: NOT DETECTED
Phencyclidine (PCP) Ur S: NOT DETECTED
Tricyclic, Ur Screen: NOT DETECTED

## 2023-04-03 LAB — VALPROIC ACID LEVEL: Valproic Acid Lvl: 105 ug/mL — ABNORMAL HIGH (ref 50.0–100.0)

## 2023-04-03 LAB — URINALYSIS, ROUTINE W REFLEX MICROSCOPIC
Bilirubin Urine: NEGATIVE
Glucose, UA: NEGATIVE mg/dL
Hgb urine dipstick: NEGATIVE
Ketones, ur: NEGATIVE mg/dL
Leukocytes,Ua: NEGATIVE
Nitrite: NEGATIVE
Protein, ur: NEGATIVE mg/dL
Specific Gravity, Urine: 1.019 (ref 1.005–1.030)
pH: 7 (ref 5.0–8.0)

## 2023-04-03 LAB — SALICYLATE LEVEL: Salicylate Lvl: <7 mg/dL — ABNORMAL LOW (ref 7.0–30.0)

## 2023-04-03 LAB — TROPONIN I (HIGH SENSITIVITY): Troponin I (High Sensitivity): 3 ng/L (ref <18)

## 2023-04-03 MED ORDER — MIDODRINE HCL 5 MG PO TABS
2.5000 mg | ORAL_TABLET | Freq: Two times a day (BID) | ORAL | Status: DC
  Administered 2023-04-03 – 2023-04-07 (×9): 2.5 mg via ORAL
  Filled 2023-04-03 (×9): qty 1

## 2023-04-03 MED ORDER — CLONAZEPAM 0.5 MG PO TABS
1.0000 mg | ORAL_TABLET | Freq: Two times a day (BID) | ORAL | Status: DC | PRN
  Administered 2023-04-06: 1 mg via ORAL
  Filled 2023-04-03: qty 2

## 2023-04-03 MED ORDER — SIMETHICONE 80 MG PO CHEW: 80.0000 mg | CHEWABLE_TABLET | Freq: Four times a day (QID) | ORAL | Status: DC | PRN

## 2023-04-03 MED ORDER — BISACODYL 10 MG RE SUPP: 10.0000 mg | Freq: Every day | RECTAL | Status: DC | PRN

## 2023-04-03 MED ORDER — LACTULOSE 10 GM/15ML PO SOLN
20.0000 g | Freq: Two times a day (BID) | ORAL | Status: DC
  Administered 2023-04-03 – 2023-04-07 (×9): 20 g via ORAL
  Filled 2023-04-03 (×9): qty 30

## 2023-04-03 MED ORDER — LORATADINE 10 MG PO TABS
10.0000 mg | ORAL_TABLET | Freq: Every day | ORAL | Status: DC
  Administered 2023-04-03 – 2023-04-07 (×5): 10 mg via ORAL
  Filled 2023-04-03 (×5): qty 1

## 2023-04-03 MED ORDER — DIVALPROEX SODIUM 500 MG PO DR TAB
2000.0000 mg | DELAYED_RELEASE_TABLET | Freq: Every day | ORAL | Status: DC
  Administered 2023-04-04 – 2023-04-06 (×3): 2000 mg via ORAL
  Filled 2023-04-03 (×3): qty 4

## 2023-04-03 MED ORDER — DOCUSATE SODIUM 100 MG PO CAPS
100.0000 mg | ORAL_CAPSULE | Freq: Two times a day (BID) | ORAL | Status: DC
  Administered 2023-04-03 – 2023-04-07 (×9): 100 mg via ORAL
  Filled 2023-04-03 (×9): qty 1

## 2023-04-03 MED ORDER — LOXAPINE SUCCINATE 5 MG PO CAPS
25.0000 mg | ORAL_CAPSULE | Freq: Three times a day (TID) | ORAL | Status: DC
  Administered 2023-04-03 – 2023-04-06 (×9): 25 mg via ORAL
  Filled 2023-04-03 (×11): qty 5

## 2023-04-03 MED ORDER — GLYCOPYRROLATE 1 MG PO TABS
1.0000 mg | ORAL_TABLET | Freq: Two times a day (BID) | ORAL | Status: DC
  Administered 2023-04-03 – 2023-04-07 (×9): 1 mg via ORAL
  Filled 2023-04-03 (×10): qty 1

## 2023-04-03 MED ORDER — PAROXETINE HCL 20 MG PO TABS
40.0000 mg | ORAL_TABLET | Freq: Every day | ORAL | Status: DC
  Administered 2023-04-03 – 2023-04-07 (×5): 40 mg via ORAL
  Filled 2023-04-03 (×5): qty 2

## 2023-04-03 MED ORDER — MIRABEGRON ER 50 MG PO TB24
50.0000 mg | ORAL_TABLET | Freq: Every day | ORAL | Status: DC
  Administered 2023-04-03 – 2023-04-07 (×5): 50 mg via ORAL
  Filled 2023-04-03 (×5): qty 1

## 2023-04-03 MED ORDER — ACETAMINOPHEN 325 MG PO TABS
650.0000 mg | ORAL_TABLET | ORAL | Status: DC | PRN
  Administered 2023-04-05: 650 mg via ORAL
  Filled 2023-04-03: qty 2

## 2023-04-03 MED ORDER — POLYVINYL ALCOHOL 1.4 % OP SOLN: 1.0000 [drp] | Freq: Three times a day (TID) | OPHTHALMIC | Status: DC | PRN

## 2023-04-03 MED ORDER — ATORVASTATIN CALCIUM 20 MG PO TABS
40.0000 mg | ORAL_TABLET | Freq: Every day | ORAL | Status: DC
  Administered 2023-04-03 – 2023-04-07 (×5): 40 mg via ORAL
  Filled 2023-04-03 (×5): qty 2

## 2023-04-03 MED ORDER — LAMOTRIGINE 100 MG PO TABS
100.0000 mg | ORAL_TABLET | Freq: Every day | ORAL | Status: DC
  Administered 2023-04-03 – 2023-04-07 (×5): 100 mg via ORAL
  Filled 2023-04-03 (×5): qty 1

## 2023-04-03 MED ORDER — PANTOPRAZOLE SODIUM 40 MG PO TBEC
40.0000 mg | DELAYED_RELEASE_TABLET | Freq: Every day | ORAL | Status: DC
  Administered 2023-04-03 – 2023-04-07 (×5): 40 mg via ORAL
  Filled 2023-04-03 (×5): qty 1

## 2023-04-03 MED ORDER — VALBENAZINE TOSYLATE 40 MG PO CAPS
80.0000 mg | ORAL_CAPSULE | Freq: Every day | ORAL | Status: DC
  Administered 2023-04-03 – 2023-04-06 (×4): 80 mg via ORAL
  Filled 2023-04-03 (×4): qty 2

## 2023-04-03 MED ORDER — LORAZEPAM 2 MG/ML IJ SOLN
2.0000 mg | Freq: Once | INTRAMUSCULAR | Status: AC
  Administered 2023-04-03: 2 mg via INTRAMUSCULAR
  Filled 2023-04-03: qty 1

## 2023-04-03 MED ORDER — TOPIRAMATE 25 MG PO TABS
75.0000 mg | ORAL_TABLET | Freq: Two times a day (BID) | ORAL | Status: DC
  Administered 2023-04-03 – 2023-04-07 (×9): 75 mg via ORAL
  Filled 2023-04-03 (×9): qty 3

## 2023-04-03 MED ORDER — MELATONIN 5 MG PO TABS
10.0000 mg | ORAL_TABLET | Freq: Every day | ORAL | Status: DC
  Administered 2023-04-03 – 2023-04-06 (×4): 10 mg via ORAL
  Filled 2023-04-03 (×4): qty 2

## 2023-04-03 MED ORDER — LEVOTHYROXINE SODIUM 50 MCG PO TABS
125.0000 ug | ORAL_TABLET | Freq: Every day | ORAL | Status: DC
  Administered 2023-04-03 – 2023-04-07 (×5): 125 ug via ORAL
  Filled 2023-04-03 (×5): qty 3

## 2023-04-03 NOTE — ED Notes (Signed)
Pt started yelling out for the bathroom. Pt was assisted to the bathroom with this tech and [place], EDT.  Before getting to the toilet pt started to lean forward and had stopped walking. Pt was instructed to put one foot in front of the other to continue walking to the toilet but was unable to follow commands. Pt attempted to toilet but was not able to do anything. Brief was changed. When pt stood up from toilet, he was unable to follow command again to walk out of the bathroom. Pt was assisted back to bed and was left resting comfortably.

## 2023-04-03 NOTE — ED Notes (Signed)
Hospital meal provided, pt tolerated w/o complaints.  Waste discarded appropriately.  
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
## 2023-04-03 NOTE — ED Notes (Addendum)
Pt dressed out : Watch Rainbow bracelet Black shoes Blue jeans Location manager - pt kept. Needs to see.  Brief Black socks

## 2023-04-03 NOTE — BH Assessment (Cosign Needed)
Patient was seen by [facility] and a bed search for appropriate level of care has been started.       Transition of Care [facility]) - Initial/Assessment Note      Patient Details  Name: Jared Mayer MRN: [id number] Date of Birth: 10-30-1970   Transition of Care [facility]) CM/SW Contact:    Colette Ribas, LCSWA Phone Number: 04/03/2023, 10:59 AM   Clinical Narrative:                      CSW received consult that guardian-[name] [phone] wants to speak about options for higher level of care. CSW called and left message for patient.   11:30AM: Per guardian patient has received previous diagnosis of dementia and is on record. She describes his behavior of memory loss and acts of aggression towards others due to confusion. Discussed patient's options which is LTC-Memory care, as he has medicaid. Advised guardian I will start bed search.

## 2023-04-03 NOTE — ED Notes (Signed)
Pt continues to scream in hallway, will ask MD for medication to help calm patient

## 2023-04-03 NOTE — [place] FL2 (Signed)
[place] MEDICAID FL2 LEVEL OF CARE FORM     IDENTIFICATION  Patient Name: Jared Mayer Birthdate: 09-22-1970 Sex: male Admission Date (Current Location): 04/03/2023  [facility] and [place] Number:  Chiropodist and Address:  [facility], [address] [place], [place] [address]      Provider Number: [id number]  Attending Physician Name and Address:  No att. 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   Fall at home, initial encounter 03/11/2021   UTI (urinary tract infection) 03/29/2018    Orientation RESPIRATION BLADDER Height & Weight     Self  Normal Continent Weight:   Height:  5\' 8"  (172.7 cm)  BEHAVIORAL SYMPTOMS/MOOD NEUROLOGICAL BOWEL NUTRITION STATUS  Physically abusive   Continent  (Fluid Consistency)  AMBULATORY STATUS COMMUNICATION OF NEEDS Skin   Limited Assist Verbally Normal                       Personal Care Assistance Level of Assistance  Bathing, Feeding, Dressing Bathing Assistance: Limited assistance Feeding assistance: Limited assistance Dressing Assistance: Limited assistance     Functional Limitations Info  Sight, Hearing, Speech Sight Info: Adequate Hearing Info: Adequate Speech Info: Adequate    SPECIAL CARE FACTORS FREQUENCY                       Contractures Contractures Info: Not present    Additional Factors Info  Code Status Code  Status Info: Priro             Current Medications (04/03/2023):  This is the current hospital active medication list Current Facility-Administered Medications  Medication Dose Route Frequency Provider Last Rate Last Admin   acetaminophen (TYLENOL) tablet 650 mg  650 mg Oral Q4H PRN Minna Antis, MD       atorvastatin (LIPITOR) tablet 40 mg  40 mg Oral Daily Minna Antis, MD       bisacodyl (DULCOLAX) suppository 10 mg  10 mg Rectal Daily PRN Minna Antis, MD       clonazePAM Scarlette Calico) tablet 1 mg  1 mg Oral BID PRN Minna Antis, MD       Melene Muller ON 04/04/2023] divalproex (DEPAKOTE) DR tablet 2,000 mg  2,000 mg Oral QHS Minna Antis, MD       docusate sodium (COLACE) capsule 100 mg  100 mg Oral BID Minna Antis, MD       glycopyrrolate (ROBINUL) tablet 1 mg  1 mg Oral BID Minna Antis, MD       lactulose (CHRONULAC) 10 GM/15ML solution 20 g  20 g Oral BID Minna Antis, MD       lamoTRIgine (LAMICTAL) tablet 100 mg  100 mg Oral Daily Minna Antis, MD       levothyroxine (SYNTHROID) tablet 125 mcg  125 mcg Oral Daily Minna Antis, MD       loratadine (CLARITIN) tablet 10 mg  10 mg Oral Daily Minna Antis,  MD       loxapine (LOXITANE) capsule 25 mg  25 mg Oral TID Minna Antis, MD       melatonin tablet 10 mg  10 mg Oral QHS Minna Antis, MD       midodrine (PROAMATINE) tablet 2.5 mg  2.5 mg Oral BID Minna Antis, MD       mirabegron ER [facility]) tablet 50 mg  50 mg Oral Daily Minna Antis, MD       pantoprazole (PROTONIX) EC tablet 40 mg  40 mg Oral Daily Minna Antis, MD       PARoxetine (PAXIL) tablet 40 mg  40 mg Oral Daily Minna Antis, MD       polyvinyl alcohol (LIQUIFILM TEARS) 1.4 % ophthalmic solution 1 drop  1 drop Both Eyes TID PRN Minna Antis, MD       simethicone (MYLICON) chewable tablet 80 mg  80 mg Oral Q6H PRN Minna Antis, MD       topiramate (TOPAMAX)  tablet 75 mg  75 mg Oral BID Minna Antis, MD       valbenazine [facility]) capsule 80 mg  80 mg Oral QHS Minna Antis, MD       Current Outpatient Medications  Medication Sig Dispense Refill   acetaminophen (TYLENOL) 325 MG tablet Take 650 mg by mouth every 4 (four) hours as needed.     atorvastatin (LIPITOR) 40 MG tablet Take 1 tablet (40 mg total) by mouth daily.     bisacodyl (DULCOLAX) 10 MG suppository Place 10 mg rectally daily as needed.     chlorhexidine (PERIDEX) 0.12 % solution 7.5 mLs by Mouth Rinse route 2 (two) times daily. After brushing teeth. Do not swallow or rinse with water or mouthwash after.     clonazePAM (KLONOPIN) 1 MG tablet Take 1 mg by mouth 2 (two) times daily as needed.     diclofenac Sodium (VOLTAREN) 1 % GEL Apply 1 Application topically 2 (two) times daily as needed. To both kneew     divalproex (DEPAKOTE) 500 MG DR tablet Take 2,000 mg by mouth at bedtime.     docusate sodium (COLACE) 100 MG capsule Take 100 mg by mouth 2 (two) times daily.     fexofenadine (ALLEGRA) 180 MG tablet Take 180 mg by mouth daily as needed for allergies.      glycopyrrolate (ROBINUL) 1 MG tablet Take 1 mg by mouth 2 (two) times daily.     INGREZZA 80 MG capsule Take 80 mg by mouth at bedtime.     ketoconazole (NIZORAL) 2 % cream SMARTSIG:1 Topical Daily     lactulose (CHRONULAC) 10 GM/15ML solution Take 20 g by mouth 2 (two) times daily.     lamoTRIgine (LAMICTAL) 100 MG tablet Take 100 mg by mouth daily.     levothyroxine (SYNTHROID) 125 MCG tablet Take 125 mcg by mouth daily.     loxapine (LOXITANE) 25 MG capsule Take 25 mg by mouth 3 (three) times daily.     Melatonin 10 MG TABS Take 10 mg by mouth at bedtime.     midodrine (PROAMATINE) 2.5 MG tablet Take 2.5 mg by mouth 2 (two) times daily.     mirabegron ER (MYRBETRIQ) 50 MG TB24 tablet Take 1 tablet (50 mg total) by mouth daily. 30 tablet 11   omeprazole (PRILOSEC) 40 MG capsule Take 40 mg by mouth daily.      PARoxetine (PAXIL) 40 MG tablet Take 40 mg by mouth daily.     polyethylene  glycol powder (GLYCOLAX/MIRALAX) 17 GM/SCOOP powder Take 34 g by mouth daily. Mix with 8 ounces of water     simethicone (MYLICON) 80 MG chewable tablet Chew 80 mg by mouth every 6 (six) hours as needed for flatulence.     sodium fluoride (PREVIDENT 5000 PLUS) 1.1 % CREA dental cream Place 1 application onto teeth 2 (two) times daily.     SYSTANE COMPLETE 0.6 % SOLN Place 1 drop into both eyes 3 (three) times daily as needed for dry eyes.     topiramate (TOPAMAX) 25 MG tablet Take 75 mg by mouth 2 (two) times daily.     triamcinolone cream (KENALOG) 0.1 % Apply 1 application  topically 2 (two) times daily. To redness on face     buPROPion (WELLBUTRIN XL) 150 MG 24 hr tablet Take 300 mg by mouth daily. (Patient not taking: Reported on 04/03/2023)     cholecalciferol (VITAMIN D3) 25 MCG (1000 UT) tablet Take 1,000 Units by mouth daily. (Patient not taking: Reported on 04/03/2023)     divalproex (DEPAKOTE ER) 500 MG 24 hr tablet Take 500-2,000 mg by mouth See admin instructions. 500 mg every morning and 2000 mg at bedtime (Patient not taking: Reported on 04/03/2023)     fluticasone (FLONASE) 50 MCG/ACT nasal spray Place 2 sprays into the nose daily as needed for allergies or rhinitis.  (Patient not taking: Reported on 04/03/2023)     hydrOXYzine (ATARAX/VISTARIL) 25 MG tablet Take 25 mg by mouth 2 (two) times daily. (Patient not taking: Reported on 04/03/2023)     levothyroxine (SYNTHROID) 112 MCG tablet Take 112 mcg by mouth daily. On an empty stomach with a glass of water 30-60 minutes before breakfast (Patient not taking: Reported on 04/03/2023)     linaclotide (LINZESS) 290 MCG CAPS capsule Take 290 mcg by mouth daily. (Patient not taking: Reported on 04/03/2023)     LORazepam (ATIVAN) 0.5 MG tablet Take 0.5-1 mg by mouth 2 (two) times daily. Take 1 tablet (0.5 mg) each morning and 2 tablets (1 mg) at bedtime (Patient not  taking: Reported on 04/03/2023)     OLANZapine (ZYPREXA) 15 MG tablet Take 30 mg by mouth at bedtime. (Patient not taking: Reported on 04/03/2023)     Omega-3 Fatty Acids (FISH OIL) 1000 MG CAPS Take 1,000 mg by mouth 2 (two) times daily. (Patient not taking: Reported on 04/03/2023)     sucralfate (CARAFATE) 1 g tablet Take 1 g by mouth 2 (two) times daily. Before meals (Patient not taking: Reported on 04/03/2023)       Discharge Medications: Please see discharge summary for a list of discharge medications.  Relevant Imaging Results:  Relevant Lab Results:   Additional Information [id number]  Colette Ribas, LCSWA

## 2023-04-03 NOTE — [place] FL2 (Signed)
[place] MEDICAID FL2 LEVEL OF CARE FORM     IDENTIFICATION  Patient Name: Jared Mayer Birthdate: [birth year] Sex: male Admission Date (Current Location): 04/03/2023  [facility] and [place] Number:  Chiropodist and Address:  [facility], [address], [place], [place] [address]      Provider Number: [id number]  Attending Physician Name and Address:  No att. 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   Fall at home, initial encounter 03/11/2021   UTI (urinary tract infection) 03/29/2018    Orientation RESPIRATION BLADDER Height & Weight     Self  Normal Continent Weight:   Height:  5\' 8"  (172.7 cm)  BEHAVIORAL SYMPTOMS/MOOD NEUROLOGICAL BOWEL NUTRITION STATUS  Physically abusive   Continent  (Fluid Consistency)  AMBULATORY STATUS COMMUNICATION OF NEEDS Skin   Limited Assist Verbally Normal                       Personal Care Assistance Level of Assistance  Bathing, Feeding, Dressing Bathing Assistance: Limited assistance Feeding assistance: Limited assistance Dressing Assistance: Limited assistance     Functional Limitations Info  Sight, Hearing, Speech Sight Info: Adequate Hearing Info: Adequate Speech Info: Adequate    SPECIAL CARE FACTORS FREQUENCY                       Contractures Contractures Info: Not present    Additional Factors Info  Code Status Code  Status Info: Priro             Current Medications (04/03/2023):  This is the current hospital active medication list Current Facility-Administered Medications  Medication Dose Route Frequency Provider Last Rate Last Admin   acetaminophen (TYLENOL) tablet 650 mg  650 mg Oral Q4H PRN Minna Antis, MD       atorvastatin (LIPITOR) tablet 40 mg  40 mg Oral Daily Minna Antis, MD       bisacodyl (DULCOLAX) suppository 10 mg  10 mg Rectal Daily PRN Minna Antis, MD       clonazePAM Scarlette Calico) tablet 1 mg  1 mg Oral BID PRN Minna Antis, MD       Melene Muller ON 04/04/2023] divalproex (DEPAKOTE) DR tablet 2,000 mg  2,000 mg Oral QHS Minna Antis, MD       docusate sodium (COLACE) capsule 100 mg  100 mg Oral BID Minna Antis, MD       glycopyrrolate (ROBINUL) tablet 1 mg  1 mg Oral BID Minna Antis, MD       lactulose (CHRONULAC) 10 GM/15ML solution 20 g  20 g Oral BID Minna Antis, MD       lamoTRIgine (LAMICTAL) tablet 100 mg  100 mg Oral Daily Minna Antis, MD       levothyroxine (SYNTHROID) tablet 125 mcg  125 mcg Oral Daily Minna Antis, MD       loratadine (CLARITIN) tablet 10 mg  10 mg Oral Daily Minna Antis,  MD       loxapine (LOXITANE) capsule 25 mg  25 mg Oral TID Minna Antis, MD       melatonin tablet 10 mg  10 mg Oral QHS Minna Antis, MD       midodrine (PROAMATINE) tablet 2.5 mg  2.5 mg Oral BID Minna Antis, MD       mirabegron ER [facility]) tablet 50 mg  50 mg Oral Daily Minna Antis, MD       pantoprazole (PROTONIX) EC tablet 40 mg  40 mg Oral Daily Minna Antis, MD       PARoxetine (PAXIL) tablet 40 mg  40 mg Oral Daily Minna Antis, MD       polyvinyl alcohol (LIQUIFILM TEARS) 1.4 % ophthalmic solution 1 drop  1 drop Both Eyes TID PRN Minna Antis, MD       simethicone (MYLICON) chewable tablet 80 mg  80 mg Oral Q6H PRN Minna Antis, MD       topiramate (TOPAMAX)  tablet 75 mg  75 mg Oral BID Minna Antis, MD       valbenazine [facility]) capsule 80 mg  80 mg Oral QHS Minna Antis, MD       Current Outpatient Medications  Medication Sig Dispense Refill   acetaminophen (TYLENOL) 325 MG tablet Take 650 mg by mouth every 4 (four) hours as needed.     atorvastatin (LIPITOR) 40 MG tablet Take 1 tablet (40 mg total) by mouth daily.     bisacodyl (DULCOLAX) 10 MG suppository Place 10 mg rectally daily as needed.     chlorhexidine (PERIDEX) 0.12 % solution 7.5 mLs by Mouth Rinse route 2 (two) times daily. After brushing teeth. Do not swallow or rinse with water or mouthwash after.     clonazePAM (KLONOPIN) 1 MG tablet Take 1 mg by mouth 2 (two) times daily as needed.     diclofenac Sodium (VOLTAREN) 1 % GEL Apply 1 Application topically 2 (two) times daily as needed. To both kneew     divalproex (DEPAKOTE) 500 MG DR tablet Take 2,000 mg by mouth at bedtime.     docusate sodium (COLACE) 100 MG capsule Take 100 mg by mouth 2 (two) times daily.     fexofenadine (ALLEGRA) 180 MG tablet Take 180 mg by mouth daily as needed for allergies.      glycopyrrolate (ROBINUL) 1 MG tablet Take 1 mg by mouth 2 (two) times daily.     INGREZZA 80 MG capsule Take 80 mg by mouth at bedtime.     ketoconazole (NIZORAL) 2 % cream SMARTSIG:1 Topical Daily     lactulose (CHRONULAC) 10 GM/15ML solution Take 20 g by mouth 2 (two) times daily.     lamoTRIgine (LAMICTAL) 100 MG tablet Take 100 mg by mouth daily.     levothyroxine (SYNTHROID) 125 MCG tablet Take 125 mcg by mouth daily.     loxapine (LOXITANE) 25 MG capsule Take 25 mg by mouth 3 (three) times daily.     Melatonin 10 MG TABS Take 10 mg by mouth at bedtime.     midodrine (PROAMATINE) 2.5 MG tablet Take 2.5 mg by mouth 2 (two) times daily.     mirabegron ER (MYRBETRIQ) 50 MG TB24 tablet Take 1 tablet (50 mg total) by mouth daily. 30 tablet 11   omeprazole (PRILOSEC) 40 MG capsule Take 40 mg by mouth daily.      PARoxetine (PAXIL) 40 MG tablet Take 40 mg by mouth daily.     polyethylene  glycol powder (GLYCOLAX/MIRALAX) 17 GM/SCOOP powder Take 34 g by mouth daily. Mix with 8 ounces of water     simethicone (MYLICON) 80 MG chewable tablet Chew 80 mg by mouth every 6 (six) hours as needed for flatulence.     sodium fluoride (PREVIDENT 5000 PLUS) 1.1 % CREA dental cream Place 1 application onto teeth 2 (two) times daily.     SYSTANE COMPLETE 0.6 % SOLN Place 1 drop into both eyes 3 (three) times daily as needed for dry eyes.     topiramate (TOPAMAX) 25 MG tablet Take 75 mg by mouth 2 (two) times daily.     triamcinolone cream (KENALOG) 0.1 % Apply 1 application  topically 2 (two) times daily. To redness on face     buPROPion (WELLBUTRIN XL) 150 MG 24 hr tablet Take 300 mg by mouth daily. (Patient not taking: Reported on 04/03/2023)     cholecalciferol (VITAMIN D3) 25 MCG (1000 UT) tablet Take 1,000 Units by mouth daily. (Patient not taking: Reported on 04/03/2023)     divalproex (DEPAKOTE ER) 500 MG 24 hr tablet Take 500-2,000 mg by mouth See admin instructions. 500 mg every morning and 2000 mg at bedtime (Patient not taking: Reported on 04/03/2023)     fluticasone (FLONASE) 50 MCG/ACT nasal spray Place 2 sprays into the nose daily as needed for allergies or rhinitis.  (Patient not taking: Reported on 04/03/2023)     hydrOXYzine (ATARAX/VISTARIL) 25 MG tablet Take 25 mg by mouth 2 (two) times daily. (Patient not taking: Reported on 04/03/2023)     levothyroxine (SYNTHROID) 112 MCG tablet Take 112 mcg by mouth daily. On an empty stomach with a glass of water 30-60 minutes before breakfast (Patient not taking: Reported on 04/03/2023)     linaclotide (LINZESS) 290 MCG CAPS capsule Take 290 mcg by mouth daily. (Patient not taking: Reported on 04/03/2023)     LORazepam (ATIVAN) 0.5 MG tablet Take 0.5-1 mg by mouth 2 (two) times daily. Take 1 tablet (0.5 mg) each morning and 2 tablets (1 mg) at bedtime (Patient not  taking: Reported on 04/03/2023)     OLANZapine (ZYPREXA) 15 MG tablet Take 30 mg by mouth at bedtime. (Patient not taking: Reported on 04/03/2023)     Omega-3 Fatty Acids (FISH OIL) 1000 MG CAPS Take 1,000 mg by mouth 2 (two) times daily. (Patient not taking: Reported on 04/03/2023)     sucralfate (CARAFATE) 1 g tablet Take 1 g by mouth 2 (two) times daily. Before meals (Patient not taking: Reported on 04/03/2023)       Discharge Medications: Please see discharge summary for a list of discharge medications.  Relevant Imaging Results:  Relevant Lab Results:   Additional Information [id number]  Colette Ribas, LCSWA

## 2023-04-03 NOTE — ED Notes (Signed)
Vol /does not meet criterial for psych inpatient for admission /reassess this am per Dr. Dolores Frame

## 2023-04-03 NOTE — ED Notes (Signed)
Pt. Assisted to bathroom, urinated and had a bowel movement, assisted back to bed

## 2023-04-03 NOTE — ED Notes (Signed)
Pt given peanut butter crackers per his request.

## 2023-04-03 NOTE — ED Notes (Signed)
Pt was given dinner tray and beverage. Pt was unable to sit on side of bed unassisted. The pt's head of the bed was brought up to assist him in eating.

## 2023-04-03 NOTE — ED Notes (Signed)
Patient screaming in hallway, not redirectable
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
## 2023-04-03 NOTE — TOC Initial Note (Addendum)
Transition of Care [facility]) - Initial/Assessment Note    Patient Details  Name: Jared Mayer MRN: [id number] Date of Birth: 02-14-1971  [facility] CM/SW Contact:    Colette Ribas, LCSWA Phone Number: 04/03/2023, 10:59 AM  Clinical Narrative:                    CSW received consult that guardian-[name] [phone] wants to speak about options for higher level of care. CSW called and left message for patient.    11:30AM: Per guardian patient has received previous diagnosis of dementia and is on record. She describes his behavior of memory loss and acts of aggression towards others due to confusion. Discussed patient's options which is LTC-Memory care, as he has medicaid. Advised guardian I will start bed search.   Patient Goals and CMS Choice            Expected Discharge Plan and Services                                              Prior Living Arrangements/Services                       Activities of Daily Living      Permission Sought/Granted                  Emotional Assessment              Admission diagnosis:  Mental Health Eval Patient Active Problem List   Diagnosis Date Noted   Aggressive behavior 04/03/2023   Dementia (HCC)    Accidental overdose 03/13/2021   Altered mental status 03/11/2021   Schizoaffective disorder, bipolar type (HCC) 03/11/2021   Accidental overdose of lamotrigine 03/11/2021   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 [place] [address] Phone: [phone] Fax: [phone]     Social Determinants of Health  (SDOH) Social History: SDOH Screenings   Food Insecurity: Unknown (03/29/2018)  Transportation Needs: Unknown (03/29/2018)  Financial Resource Strain: Low Risk  (03/29/2018)  Physical Activity: Unknown (03/29/2018)  Social Connections: Unknown (03/29/2018)  Stress: No Stress Concern Present (03/29/2018)  Tobacco Use: Low Risk  (04/03/2023)   SDOH Interventions:     Readmission Risk Interventions     No data to display

## 2023-04-03 NOTE — ED Notes (Signed)
Patient is vol pending placement 

## 2023-04-03 NOTE — ED Notes (Signed)
Pt screaming in hallway, not redirectable

## 2023-04-04 DIAGNOSIS — F250 Schizoaffective disorder, bipolar type: Secondary | ICD-10-CM | POA: Diagnosis not present

## 2023-04-04 NOTE — ED Notes (Signed)
Pt given dinner tray.

## 2023-04-04 NOTE — ED Notes (Signed)
This RN and Ian Malkin, RN assisted patient onto bedside commode and provided peri-care. Patient in bed resting comfortably.

## 2023-04-04 NOTE — ED Provider Notes (Signed)
-----------------------------------------   5:22 AM on 04/04/2023 -----------------------------------------   Blood pressure 107/75, pulse 91, temperature 97.6 F (36.4 C), temperature source Oral, resp. rate 16, height 5\' 8"  (1.727 m), SpO2 95%.  The patient is calm and cooperative at this time.  There have been no acute events since the last update.  Awaiting disposition plan from case management/social work.    Mathis Cashman, [name], DO 04/04/23 Jeralyn Bennett

## 2023-04-04 NOTE — ED Notes (Signed)
Hospital meal provided, pt tolerated w/o complaints.  Waste discarded appropriately.  

## 2023-04-04 NOTE — ED Notes (Signed)
Patients lunch tray delivered 

## 2023-04-04 NOTE — ED Notes (Signed)
Pt was provided with lunch tray. Pt sitting up eating.

## 2023-04-04 NOTE — ED Notes (Signed)
Pt assisted the in room toilet and back to bed by this RN and NT. Pt urinated.

## 2023-04-04 NOTE — ED Notes (Signed)
Bed alarm turned on.

## 2023-04-04 NOTE — ED Notes (Signed)
This RN and Ian Malkin, RN assisted patient onto commode, provided peri-care, changed brief, and scrubs. Patient resting comfortably in bed watching cartoons. This RN provided crayons and coloring sheets per the patient's request.

## 2023-04-05 DIAGNOSIS — R4182 Altered mental status, unspecified: Secondary | ICD-10-CM | POA: Diagnosis not present

## 2023-04-05 DIAGNOSIS — F250 Schizoaffective disorder, bipolar type: Secondary | ICD-10-CM

## 2023-04-05 DIAGNOSIS — A4101 Sepsis due to Methicillin susceptible Staphylococcus aureus: Secondary | ICD-10-CM | POA: Diagnosis not present

## 2023-04-05 NOTE — Consult Note (Signed)
[facility] Face-to-Face Psychiatry Consult   Reason for Consult:  Consult Referring Physician:  Dr. Irean Hong Patient Identification: Dajean Gochez MRN:  [id number] Principal Diagnosis: Schizoaffective disorder, bipolar type [facility]) Diagnosis:  Principal Problem:   Schizoaffective disorder, bipolar type (HCC) Active Problems:   Mild intellectual disability   Aggressive behavior  Total Time spent with patient:  25 minutes  Subjective:  "coloring"  HPI:   Pt chart reviewed and assessed face to face. Pt seen at bedside, has been coloring, is being given lunch now. Reports he likes coloring. He is also looking forward to lunch. He is partially oriented, oriented to first and last name. Incorrectly states he is in his "52". When asked about date of birth, repeats "[age]...tw[age]...[age]...". Looks at his arm bracelet and then states his birthday is in [birth year]. He states we are in [place]. In no acute distress. Non toxic appearing. Denies suicidal, homicidal ideations. Denies auditory visual hallucinations or paranoia. Reports sleep and appetite are fair. Pt psych cleared and is boarding in the ED for [facility] disposition.  Collateral w/ legal guardian, [name], [phone]. Legal guardian is a Engineer, civil (consulting) at [employer]. Reports pt was given recent diagnosis of Lewy Body Dementia. She believes he may have some cognitive/memory decline although hard to tell as pt does tend to repeat statements and has baseline intellectual disability. She states group home is supposed to see pt today to assess whether he is appropriate to return to group home.  Past Psychiatric History: Intellectual disability, Schizoaffective disorder  Risk to Self: Denies suicidal ideations Risk to Others: Denies homicidal ideations Prior Inpatient Therapy: Unknown Prior Outpatient Therapy: Unknown  Past Medical History:  Past Medical History:  Diagnosis Date   Dementia (HCC)    Lewy Body   Dyslipidemia    Hypothyroid     Manic behavior (HCC)    Mentally disabled    Schizoaffective disorder, bipolar type (HCC)    History reviewed. No pertinent surgical history. Family History:  Family History  Problem Relation Age of Onset   Hypothyroidism Mother    Sarcoidosis Mother    Family Psychiatric  History: None reported Social History:  Social History   Substance and Sexual Activity  Alcohol Use Never     Social History   Substance and Sexual Activity  Drug Use Never    Social History   Socioeconomic History   Marital status: Single    Spouse name: Not on file   Number of children: Not on file   Years of education: Not on file   Highest education level: Not on file  Occupational History   Not on file  Tobacco Use   Smoking status: Never   Smokeless tobacco: Never  Vaping Use   Vaping status: Never Used  Substance and Sexual Activity   Alcohol use: Never   Drug use: Never   Sexual activity: Never  Other Topics Concern   Not on file  Social History Narrative   Not on file   Social Determinants of Health   Financial Resource Strain: Low Risk  (03/29/2018)   Overall Financial Resource Strain (CARDIA)    Difficulty of Paying Living Expenses: Not hard at all  Food Insecurity: Unknown (03/29/2018)   Hunger Vital Sign    Worried About Running Out of Food in the Last Year: Patient declined    Ran Out of Food in the Last Year: Patient declined  Transportation Needs: Unknown (03/29/2018)   PRAPARE - Administrator, Civil Service (Medical):  Patient declined    Lack of Transportation (Non-Medical): Patient declined  Physical Activity: Unknown (03/29/2018)   Exercise Vital Sign    Days of Exercise per Week: Patient declined    Minutes of Exercise per Session: Patient declined  Stress: No Stress Concern Present (03/29/2018)   [employer] of Occupational Health - Occupational Stress Questionnaire    Feeling of Stress : Only a little  Social Connections: Unknown (03/29/2018)    Social Connection and Isolation Panel [NHANES]    Frequency of Communication with Friends and Family: Patient declined    Frequency of Social Gatherings with Friends and Family: Patient declined    Attends Religious Services: Patient declined    Database administrator or Organizations: Patient declined    Attends Banker Meetings: Patient declined    Marital Status: Patient declined   Additional Social History:    Allergies:  No Known Allergies  Labs: No results found for this or any previous visit (from the past 48 hour(s)).  Current Facility-Administered Medications  Medication Dose Route Frequency Provider Last Rate Last Admin   acetaminophen (TYLENOL) tablet 650 mg  650 mg Oral Q4H PRN Minna Antis, MD       atorvastatin (LIPITOR) tablet 40 mg  40 mg Oral Daily Minna Antis, MD   40 mg at 04/05/23 1610   bisacodyl (DULCOLAX) suppository 10 mg  10 mg Rectal Daily PRN Minna Antis, MD       clonazePAM Scarlette Calico) tablet 1 mg  1 mg Oral BID PRN Minna Antis, MD       divalproex (DEPAKOTE) DR tablet 2,000 mg  2,000 mg Oral QHS Minna Antis, MD   2,000 mg at 04/04/23 2107   docusate sodium (COLACE) capsule 100 mg  100 mg Oral BID Minna Antis, MD   100 mg at 04/05/23 9604   glycopyrrolate (ROBINUL) tablet 1 mg  1 mg Oral BID Minna Antis, MD   1 mg at 04/05/23 5409   lactulose (CHRONULAC) 10 GM/15ML solution 20 g  20 g Oral BID Minna Antis, MD   20 g at 04/05/23 8119   lamoTRIgine (LAMICTAL) tablet 100 mg  100 mg Oral Daily Minna Antis, MD   100 mg at 04/05/23 1478   levothyroxine (SYNTHROID) tablet 125 mcg  125 mcg Oral Daily Minna Antis, MD   125 mcg at 04/05/23 2956   loratadine (CLARITIN) tablet 10 mg  10 mg Oral Daily Minna Antis, MD   10 mg at 04/05/23 2130   loxapine (LOXITANE) capsule 25 mg  25 mg Oral TID Minna Antis, MD   25 mg at 04/04/23 2346   melatonin tablet 10 mg  10 mg Oral QHS  Minna Antis, MD   10 mg at 04/04/23 2106   midodrine (PROAMATINE) tablet 2.5 mg  2.5 mg Oral BID Minna Antis, MD   2.5 mg at 04/05/23 8657   mirabegron ER (MYRBETRIQ) tablet 50 mg  50 mg Oral Daily Minna Antis, MD   50 mg at 04/05/23 0938   pantoprazole (PROTONIX) EC tablet 40 mg  40 mg Oral Daily Minna Antis, MD   40 mg at 04/05/23 8469   PARoxetine (PAXIL) tablet 40 mg  40 mg Oral Daily Minna Antis, MD   40 mg at 04/05/23 6295   polyvinyl alcohol (LIQUIFILM TEARS) 1.4 % ophthalmic solution 1 drop  1 drop Both Eyes TID PRN Minna Antis, MD       simethicone [facility]) chewable tablet 80 mg  80 mg Oral Q6H  PRN Minna Antis, MD       topiramate (TOPAMAX) tablet 75 mg  75 mg Oral BID Minna Antis, MD   75 mg at 04/05/23 8841   valbenazine (INGREZZA) capsule 80 mg  80 mg Oral Amedeo Kinsman, MD   80 mg at 04/04/23 2107   Current Outpatient Medications  Medication Sig Dispense Refill   acetaminophen (TYLENOL) 325 MG tablet Take 650 mg by mouth every 4 (four) hours as needed.     atorvastatin (LIPITOR) 40 MG tablet Take 1 tablet (40 mg total) by mouth daily.     bisacodyl (DULCOLAX) 10 MG suppository Place 10 mg rectally daily as needed.     chlorhexidine (PERIDEX) 0.12 % solution 7.5 mLs by Mouth Rinse route 2 (two) times daily. After brushing teeth. Do not swallow or rinse with water or mouthwash after.     clonazePAM (KLONOPIN) 1 MG tablet Take 1 mg by mouth 2 (two) times daily as needed.     diclofenac Sodium (VOLTAREN) 1 % GEL Apply 1 Application topically 2 (two) times daily as needed. To both kneew     divalproex (DEPAKOTE) 500 MG DR tablet Take 2,000 mg by mouth at bedtime.     docusate sodium (COLACE) 100 MG capsule Take 100 mg by mouth 2 (two) times daily.     fexofenadine (ALLEGRA) 180 MG tablet Take 180 mg by mouth daily as needed for allergies.      glycopyrrolate (ROBINUL) 1 MG tablet Take 1 mg by mouth 2 (two) times daily.      INGREZZA 80 MG capsule Take 80 mg by mouth at bedtime.     ketoconazole (NIZORAL) 2 % cream SMARTSIG:1 Topical Daily     lactulose (CHRONULAC) 10 GM/15ML solution Take 20 g by mouth 2 (two) times daily.     lamoTRIgine (LAMICTAL) 100 MG tablet Take 100 mg by mouth daily.     levothyroxine (SYNTHROID) 125 MCG tablet Take 125 mcg by mouth daily.     loxapine (LOXITANE) 25 MG capsule Take 25 mg by mouth 3 (three) times daily.     Melatonin 10 MG TABS Take 10 mg by mouth at bedtime.     midodrine (PROAMATINE) 2.5 MG tablet Take 2.5 mg by mouth 2 (two) times daily.     mirabegron ER (MYRBETRIQ) 50 MG TB24 tablet Take 1 tablet (50 mg total) by mouth daily. 30 tablet 11   omeprazole (PRILOSEC) 40 MG capsule Take 40 mg by mouth daily.     PARoxetine (PAXIL) 40 MG tablet Take 40 mg by mouth daily.     polyethylene glycol powder (GLYCOLAX/MIRALAX) 17 GM/SCOOP powder Take 34 g by mouth daily. Mix with 8 ounces of water     simethicone (MYLICON) 80 MG chewable tablet Chew 80 mg by mouth every 6 (six) hours as needed for flatulence.     sodium fluoride (PREVIDENT 5000 PLUS) 1.1 % CREA dental cream Place 1 application onto teeth 2 (two) times daily.     SYSTANE COMPLETE 0.6 % SOLN Place 1 drop into both eyes 3 (three) times daily as needed for dry eyes.     topiramate (TOPAMAX) 25 MG tablet Take 75 mg by mouth 2 (two) times daily.     triamcinolone cream (KENALOG) 0.1 % Apply 1 application  topically 2 (two) times daily. To redness on face     buPROPion (WELLBUTRIN XL) 150 MG 24 hr tablet Take 300 mg by mouth daily. (Patient not taking: Reported on 04/03/2023)  cholecalciferol (VITAMIN D3) 25 MCG (1000 UT) tablet Take 1,000 Units by mouth daily. (Patient not taking: Reported on 04/03/2023)     divalproex (DEPAKOTE ER) 500 MG 24 hr tablet Take 500-2,000 mg by mouth See admin instructions. 500 mg every morning and 2000 mg at bedtime (Patient not taking: Reported on 04/03/2023)     fluticasone (FLONASE) 50  MCG/ACT nasal spray Place 2 sprays into the nose daily as needed for allergies or rhinitis.  (Patient not taking: Reported on 04/03/2023)     hydrOXYzine (ATARAX/VISTARIL) 25 MG tablet Take 25 mg by mouth 2 (two) times daily. (Patient not taking: Reported on 04/03/2023)     levothyroxine (SYNTHROID) 112 MCG tablet Take 112 mcg by mouth daily. On an empty stomach with a glass of water 30-60 minutes before breakfast (Patient not taking: Reported on 04/03/2023)     linaclotide (LINZESS) 290 MCG CAPS capsule Take 290 mcg by mouth daily. (Patient not taking: Reported on 04/03/2023)     LORazepam (ATIVAN) 0.5 MG tablet Take 0.5-1 mg by mouth 2 (two) times daily. Take 1 tablet (0.5 mg) each morning and 2 tablets (1 mg) at bedtime (Patient not taking: Reported on 04/03/2023)     OLANZapine (ZYPREXA) 15 MG tablet Take 30 mg by mouth at bedtime. (Patient not taking: Reported on 04/03/2023)     Omega-3 Fatty Acids (FISH OIL) 1000 MG CAPS Take 1,000 mg by mouth 2 (two) times daily. (Patient not taking: Reported on 04/03/2023)     sucralfate (CARAFATE) 1 g tablet Take 1 g by mouth 2 (two) times daily. Before meals (Patient not taking: Reported on 04/03/2023)      Musculoskeletal: Strength & Muscle Tone:  sitting up in bed Gait & Station:  sitting up in bed Patient leans:  sitting up in bed            Psychiatric Specialty Exam:  Presentation  General Appearance:  Appropriate for Environment  Eye Contact: Fair  Speech: Garbled  Speech Volume: Decreased  Handedness: Right   Mood and Affect  Mood: -- ("ok")  Affect: Constricted; Flat   Thought Process  Thought Processes: Coherent  Descriptions of Associations:Intact  Orientation:Partial  Thought Content:WDL  History of Schizophrenia/Schizoaffective disorder:Yes Duration of Psychotic Symptoms:None currently Hallucinations:Hallucinations: None  Ideas of Reference:None  Suicidal Thoughts:Suicidal Thoughts:  No  Homicidal Thoughts:Homicidal Thoughts: No   Sensorium  Memory: Immediate Fair  Judgment: Intact  Insight: Other (comment) (limited)   Executive Functions  Concentration: Other (comment) (limited)  Attention Span: Other (comment) (limited)  Recall: Other (comment) (limited)  Fund of Knowledge: Other (comment) (limited)  Language: Other (comment) (limited)   Psychomotor Activity  Psychomotor Activity: Psychomotor Activity: Other (comment)   Assets  Assets: Desire for Improvement; Resilience   Sleep  Sleep: Sleep: Fair   Physical Exam: Physical Exam Constitutional:      General: He is not in acute distress.    Appearance: He is not ill-appearing, toxic-appearing or diaphoretic.  Eyes:     General: No scleral icterus. Cardiovascular:     Rate and Rhythm: Normal rate.  Pulmonary:     Effort: Pulmonary effort is normal. No respiratory distress.  Neurological:     Mental Status: He is alert.  Psychiatric:        Attention and Perception: Perception normal.        Mood and Affect: Mood normal. Affect is flat.        Behavior: Behavior is cooperative.        Thought  Content: Thought content normal.        Cognition and Memory: Cognition is impaired. Memory is impaired.    Review of Systems  Constitutional:  Negative for chills and fever.  Respiratory:  Negative for shortness of breath.   Cardiovascular:  Negative for chest pain and palpitations.  Gastrointestinal:  Negative for abdominal pain.  Neurological:  Negative for headaches.  Psychiatric/Behavioral: Negative.     Blood pressure 112/84, pulse 98, temperature 98.7 F (37.1 C), temperature source Oral, resp. rate 16, height 5\' 8"  (1.727 m), SpO2 98%. Body mass index is 27.37 kg/m.  Treatment Plan Summary: Plan    52 y/o male w/ history of lewy body dementia, intellectual disability, schizoaffective disorder admitted to [facility] for outbursts at group home. On assessment, pt is limited  historian. He does deny suicidal, homicidal ideations, auditory visual hallucinations or paranoia. There is no evidence of agitation, aggression, internal preoccupation. No delusions or paranoia elicited. Does not appear manic/hypomanic or psychotic. Calm, cooperative. Pt is psychiatrically cleared. Boarding in the emergency department awaiting TOC disposition. Per collateral from pt/s mother/legal guardian, group home will be assessing pt some time today and may be able to discharge back there.  Disposition: No evidence of imminent risk to self or others at present.   Patient does not meet criteria for psychiatric inpatient admission. Supportive therapy provided about ongoing stressors.  Lauree Chandler, NP 04/05/2023 12:52 PM

## 2023-04-05 NOTE — ED Notes (Addendum)
Patient acting like he was choking while feeding self. Had patient spit out all pieces of food put in mouth. Looked inside patients mouth seemed to be no remaining food left. Teeth missing and discoloration noticed. Patient drank some water and is finished with lunch at this time. Patient now coloring.

## 2023-04-05 NOTE — ED Provider Notes (Signed)
-----------------------------------------   4:47 AM on 04/05/2023 -----------------------------------------   Blood pressure 127/70, pulse (!) 110, temperature 99 F (37.2 C), temperature source Oral, resp. rate 19, height 5\' 8"  (1.727 m), SpO2 98%.  The patient is calm and cooperative at this time.  There have been no acute events since the last update.  Awaiting disposition plan from case management/social work.    [name], [name], DO 04/05/23 [phone]

## 2023-04-05 NOTE — ED Notes (Signed)
Patient received dinner tray at this time.

## 2023-04-05 NOTE — ED Provider Notes (Signed)
Emergency Medicine Observation Re-evaluation Note  Jared Mayer is a 52 y.o. male, seen on rounds today.  Pt initially presented to the ED for complaints of Psychiatric Evaluation  Currently, the patient is resting comfortably.  Physical Exam  BP 103/71 (BP Location: Left Arm)   Pulse 92   Temp 98.5 F (36.9 C) (Oral)   Resp 19   Ht 5\' 8"  (1.727 m)   SpO2 95%   BMI 27.37 kg/m  General: No acute distress Cardiac: Well-perfused extremities Lungs: No respiratory distress Psych: Appropriate mood and affect  ED Course / MDM  EKG:EKG Interpretation Date/Time:  Saturday April 03 2023 02:16:05 EST Ventricular Rate:  86 PR Interval:  152 QRS Duration:  80 QT Interval:  386 QTC Calculation: 461 R Axis:   124  Text Interpretation: Normal sinus rhythm Right axis deviation Abnormal ECG When compared with ECG of 23-Apr-2021 09:25, No significant change was found Confirmed by UNCONFIRMED, DOCTOR ([id number]), editor Lonell Face [phone]) on 04/05/2023 9:31:13 AM  I have reviewed the labs performed to date as well as medications administered while in observation.  Recent changes in the last 24 hours include none.  Plan  Current plan is for placement.   Merwyn Katos, MD 04/05/23 [phone]

## 2023-04-05 NOTE — ED Notes (Signed)
Breakfast placed at side, pt resting comfortably.

## 2023-04-05 NOTE — ED Notes (Addendum)
Psych NP at patient bedside. Patient received lunch tray at this time. Set up tray and cut food into tiny pieces for patient to be able to feed self.

## 2023-04-05 NOTE — ED Notes (Signed)
Coloring and watching TV in bed. Pt currently clean and dry. Angelique Blonder further needs.

## 2023-04-05 NOTE — ED Notes (Signed)
With assistance of NT helped get patient out of bed to ambulate to toilet. Patient was stating he needed to use the bathroom. Patient was incontinent of urine in brief, scrub pants, and incontinent pad. Let patient go to bathroom on toilet but did not go. Changed brief, pants and pad. Assisted patient back to bed and repositioned to continue eating lunch.

## 2023-04-05 NOTE — ED Notes (Signed)
Pt given PM snack at this time.  

## 2023-04-05 NOTE — ED Notes (Signed)
New linens. Pericare and new brief on patient by RN and EDT. Pt cooperative, calm and resting in bed calmly at this time.

## 2023-04-05 NOTE — TOC Progression Note (Addendum)
Transition of Care [facility]) - Progression Note    Patient Details  Name: Jared Mayer MRN: [id number] Date of Birth: Oct 04, 1970  Transition of Care [facility]) CM/SW Contact  Marquita Palms, LCSW Phone Number: 04/05/2023, 1:33 PM  Clinical Narrative:     CSW met with patients guardian in the emergency department. Patient's guardian reported she was not told about the medication change and she was not sure why they even change it. CSW called Meriam Sprague of [facility] [phone]. CSW called and lvm for [facility] to callback. Patient's guardian reported he was diagnosed with dementia about 3 months ago. Patient's guardian reported she does not have proof of his diagnosis. She reported that [place] and [facility] had gotten him the diagnosis. CSW awaiting phone call from [facility].  2:30 pm [facility] "diagnostic him by giving him a "test." Friday past medication changes were made by [facility] . No medication had been withheld before his medication. Beverly from [employer] called and reported his mother reported that she would find another placement.    Expected Discharge Plan and Services                                               Social Determinants of Health (SDOH) Interventions SDOH Screenings   Food Insecurity: Unknown (03/29/2018)  Transportation Needs: Unknown (03/29/2018)  Financial Resource Strain: Low Risk  (03/29/2018)  Physical Activity: Unknown (03/29/2018)  Social Connections: Unknown (03/29/2018)  Stress: No Stress Concern Present (03/29/2018)  Tobacco Use: Low Risk  (04/03/2023)    Readmission Risk Interventions     No data to display

## 2023-04-06 ENCOUNTER — Encounter: Payer: Self-pay | Admitting: Emergency Medicine

## 2023-04-06 NOTE — TOC Progression Note (Signed)
Transition of Care [facility]) - Progression Note    Patient Details  Name: Jared Mayer MRN: [id number] Date of Birth: 1970-10-28  [facility] CM/SW Contact  Marquita Palms, LCSW Phone Number: 04/06/2023, 9:42 AM  Clinical Narrative:     04/05/2023 CSW met with patient bedside on yesterday and patient was observed spitting food out of his mouth and screaming at the nurses and security as they tried to calm him down. 04/06/2023 CSW spoke with nurse to talk about his behaviors over night. Patients nurse reported he had no issues that she has seen. CSW expressed concern for patient diagnosis and called [facility] concerning DX. According to the nurse at [facility] partners who explained that she does not see a dementia diagnosis for this patient. Patient's current group home reported that patient would need to be inpatient before they would consider taking him back. She does recognize his medication changes but denies they affected him before he was brought to the emergency room. Patient cleared by psych and cleared to discharge. CSW will work with family towards placement.       Expected Discharge Plan and Services                                               Social Determinants of Health (SDOH) Interventions SDOH Screenings   Food Insecurity: Unknown (03/29/2018)  Transportation Needs: Unknown (03/29/2018)  Financial Resource Strain: Low Risk  (03/29/2018)  Physical Activity: Unknown (03/29/2018)  Social Connections: Unknown (03/29/2018)  Stress: No Stress Concern Present (03/29/2018)  Tobacco Use: Low Risk  (04/03/2023)    Readmission Risk Interventions     No data to display

## 2023-04-06 NOTE — ED Provider Notes (Signed)
Emergency Medicine Observation Re-evaluation Note  Jared Mayer is a 52 y.o. male, awaiting dispo  Physical Exam  BP 109/80 (BP Location: Right Arm)   Pulse (!) 111   Temp 98.9 F (37.2 C) (Oral)   Resp 18   Ht 5\' 8"  (1.727 m)   SpO2 93%   BMI 27.37 kg/m  Physical Exam General: Resting comfortably  ED Course / MDM   I have reviewed the labs performed to date as well as medications administered while in observation.    Plan  Current plan is for toc dispo.    Phineas Semen, MD 04/06/23 [phone]

## 2023-04-06 NOTE — ED Notes (Signed)
RN assisted pt to and from BR. New brief placed on patient. Pt resting in bed watching TV now.

## 2023-04-06 NOTE — ED Notes (Signed)
Pt with saturated brief after incontinence of urine. Pt cleaned and new brief with chux applied. Repositioned for comfort and denies any further needs.

## 2023-04-06 NOTE — ED Notes (Signed)
Food cut up for patient. Pt sitting up in bed eating at this time

## 2023-04-06 NOTE — ED Notes (Signed)
Pt cleaned up of BM and urine. Bedding changed and patient placed in new scrub top. Bed alarm on

## 2023-04-06 NOTE — ED Notes (Signed)
TV turned on for patient

## 2023-04-06 NOTE — ED Notes (Signed)
Pt resting in bed watching TV

## 2023-04-06 NOTE — ED Notes (Signed)
Pt eating at this time. Food cut up into small pieces

## 2023-04-06 NOTE — ED Notes (Signed)
VOL/ pt doesn't me psych inpt, pending placement

## 2023-04-06 NOTE — ED Notes (Signed)
VOL/ pt doesn't meet psych inpt, pending placement

## 2023-04-06 NOTE — ED Notes (Signed)
Pt resting in stretcher at this time. Awake and alert. Bed alarm on.

## 2023-04-06 NOTE — ED Notes (Signed)
Received report from previous nurse.  Patient is currently sleeping.  No assessment possible at this time.

## 2023-04-06 NOTE — ED Provider Notes (Signed)
-----------------------------------------   11:04 PM on 04/06/2023 -----------------------------------------   Blood pressure 113/67, pulse (!) 117, temperature 98 F (36.7 C), temperature source Oral, resp. rate 19, height 5\' 8"  (1.727 m), SpO2 92%.  The patient is calm and cooperative at this time.  There have been no acute events since the last update.  Awaiting disposition plan from [facility] team.   Janith Lima, MD 04/06/23 [phone]

## 2023-04-06 NOTE — ED Notes (Signed)
Pt sleeping in bed.  Chest rise and fall noted.

## 2023-04-07 MED ORDER — LOXAPINE SUCCINATE 5 MG PO CAPS
25.0000 mg | ORAL_CAPSULE | Freq: Three times a day (TID) | ORAL | Status: DC
  Filled 2023-04-07 (×2): qty 1

## 2023-04-07 MED ORDER — LOXAPINE SUCCINATE 5 MG PO CAPS
25.0000 mg | ORAL_CAPSULE | Freq: Three times a day (TID) | ORAL | Status: DC
  Administered 2023-04-07: 25 mg via ORAL
  Filled 2023-04-07 (×3): qty 5

## 2023-04-07 NOTE — ED Notes (Signed)
RN in to medicate pt. Pt is saturated in urine and feces. This RN and RN ZB assisted to clean pt and put on new brief and gown.

## 2023-04-07 NOTE — Discharge Instructions (Signed)
Return to the ER if you develop worsening symptoms or any other concerns.

## 2023-04-07 NOTE — ED Provider Notes (Signed)
-----------------------------------------   2:59 AM on 04/07/2023 -----------------------------------------   Blood pressure 113/67, pulse (!) 117, temperature 98 F (36.7 C), temperature source Oral, resp. rate 19, height 1.727 m (5\' 8" ), SpO2 92%.  The patient is calm and cooperative at this time.  There have been no acute events since the last update.  Awaiting disposition plan from [facility] team.   Loleta Rose, MD 04/07/23 [phone]

## 2023-04-07 NOTE — ED Notes (Signed)
RN to bedside to introduce self to pt. Pt sleeping at this time.

## 2023-04-07 NOTE — ED Notes (Signed)
Staff from group home with clothes to bedside. Pt dressed.

## 2023-04-07 NOTE — ED Provider Notes (Signed)
Discussed with Child psychotherapist and patient is plan to be picked up by his group home at 10 AM and go to an appointment at the [place] [facility].  I also discussed with the legal guardian [name] who is agreeable to this plan.  If there are any issues with patient getting back into the group home later today they will return patient to the emergency room for evaluation.   Concha Se, MD 04/07/23 [phone]

## 2023-04-07 NOTE — ED Notes (Signed)
Pt is doing a lot of excessive salivating and isnt controlling it himself that well.

## 2023-04-08 ENCOUNTER — Other Ambulatory Visit: Payer: Self-pay

## 2023-04-08 ENCOUNTER — Emergency Department: Payer: Medicare HMO

## 2023-04-08 ENCOUNTER — Inpatient Hospital Stay
Admission: EM | Admit: 2023-04-08 | Discharge: 2023-06-12 | DRG: 870 | Disposition: E | Payer: Medicare HMO | Attending: Internal Medicine | Admitting: Internal Medicine

## 2023-04-08 DIAGNOSIS — F0284 Dementia in other diseases classified elsewhere, unspecified severity, with anxiety: Secondary | ICD-10-CM | POA: Diagnosis present

## 2023-04-08 DIAGNOSIS — M6282 Rhabdomyolysis: Secondary | ICD-10-CM | POA: Diagnosis present

## 2023-04-08 DIAGNOSIS — R1312 Dysphagia, oropharyngeal phase: Secondary | ICD-10-CM | POA: Diagnosis not present

## 2023-04-08 DIAGNOSIS — J690 Pneumonitis due to inhalation of food and vomit: Secondary | ICD-10-CM | POA: Diagnosis not present

## 2023-04-08 DIAGNOSIS — A419 Sepsis, unspecified organism: Secondary | ICD-10-CM | POA: Diagnosis not present

## 2023-04-08 DIAGNOSIS — E785 Hyperlipidemia, unspecified: Secondary | ICD-10-CM | POA: Diagnosis present

## 2023-04-08 DIAGNOSIS — F02818 Dementia in other diseases classified elsewhere, unspecified severity, with other behavioral disturbance: Secondary | ICD-10-CM | POA: Diagnosis present

## 2023-04-08 DIAGNOSIS — Z66 Do not resuscitate: Secondary | ICD-10-CM | POA: Diagnosis present

## 2023-04-08 DIAGNOSIS — J15211 Pneumonia due to Methicillin susceptible Staphylococcus aureus: Secondary | ICD-10-CM | POA: Diagnosis present

## 2023-04-08 DIAGNOSIS — T426X5A Adverse effect of other antiepileptic and sedative-hypnotic drugs, initial encounter: Secondary | ICD-10-CM | POA: Diagnosis present

## 2023-04-08 DIAGNOSIS — E876 Hypokalemia: Secondary | ICD-10-CM | POA: Diagnosis present

## 2023-04-08 DIAGNOSIS — R6521 Severe sepsis with septic shock: Secondary | ICD-10-CM | POA: Diagnosis present

## 2023-04-08 DIAGNOSIS — Z79899 Other long term (current) drug therapy: Secondary | ICD-10-CM | POA: Diagnosis not present

## 2023-04-08 DIAGNOSIS — K219 Gastro-esophageal reflux disease without esophagitis: Secondary | ICD-10-CM | POA: Diagnosis present

## 2023-04-08 DIAGNOSIS — Z515 Encounter for palliative care: Secondary | ICD-10-CM

## 2023-04-08 DIAGNOSIS — E46 Unspecified protein-calorie malnutrition: Secondary | ICD-10-CM | POA: Diagnosis present

## 2023-04-08 DIAGNOSIS — G210 Malignant neuroleptic syndrome: Secondary | ICD-10-CM | POA: Diagnosis present

## 2023-04-08 DIAGNOSIS — G928 Other toxic encephalopathy: Secondary | ICD-10-CM | POA: Diagnosis present

## 2023-04-08 DIAGNOSIS — F02C18 Dementia in other diseases classified elsewhere, severe, with other behavioral disturbance: Secondary | ICD-10-CM | POA: Diagnosis not present

## 2023-04-08 DIAGNOSIS — F250 Schizoaffective disorder, bipolar type: Secondary | ICD-10-CM | POA: Diagnosis present

## 2023-04-08 DIAGNOSIS — T796XXA Traumatic ischemia of muscle, initial encounter: Secondary | ICD-10-CM | POA: Diagnosis not present

## 2023-04-08 DIAGNOSIS — E039 Hypothyroidism, unspecified: Secondary | ICD-10-CM | POA: Insufficient documentation

## 2023-04-08 DIAGNOSIS — R410 Disorientation, unspecified: Secondary | ICD-10-CM | POA: Diagnosis not present

## 2023-04-08 DIAGNOSIS — R400 Somnolence: Secondary | ICD-10-CM | POA: Diagnosis not present

## 2023-04-08 DIAGNOSIS — R4182 Altered mental status, unspecified: Secondary | ICD-10-CM | POA: Diagnosis present

## 2023-04-08 DIAGNOSIS — G3183 Dementia with Lewy bodies: Secondary | ICD-10-CM | POA: Diagnosis not present

## 2023-04-08 DIAGNOSIS — G249 Dystonia, unspecified: Secondary | ICD-10-CM | POA: Diagnosis present

## 2023-04-08 DIAGNOSIS — R296 Repeated falls: Secondary | ICD-10-CM | POA: Diagnosis present

## 2023-04-08 DIAGNOSIS — F71 Moderate intellectual disabilities: Secondary | ICD-10-CM | POA: Diagnosis present

## 2023-04-08 DIAGNOSIS — J9601 Acute respiratory failure with hypoxia: Secondary | ICD-10-CM | POA: Diagnosis not present

## 2023-04-08 DIAGNOSIS — E559 Vitamin D deficiency, unspecified: Secondary | ICD-10-CM | POA: Diagnosis present

## 2023-04-08 DIAGNOSIS — F419 Anxiety disorder, unspecified: Secondary | ICD-10-CM | POA: Insufficient documentation

## 2023-04-08 DIAGNOSIS — G9340 Encephalopathy, unspecified: Secondary | ICD-10-CM | POA: Diagnosis not present

## 2023-04-08 DIAGNOSIS — E871 Hypo-osmolality and hyponatremia: Secondary | ICD-10-CM | POA: Diagnosis present

## 2023-04-08 DIAGNOSIS — R6510 Systemic inflammatory response syndrome (SIRS) of non-infectious origin without acute organ dysfunction: Secondary | ICD-10-CM | POA: Diagnosis not present

## 2023-04-08 DIAGNOSIS — G2111 Neuroleptic induced parkinsonism: Secondary | ICD-10-CM | POA: Diagnosis not present

## 2023-04-08 DIAGNOSIS — R509 Fever, unspecified: Secondary | ICD-10-CM | POA: Diagnosis not present

## 2023-04-08 DIAGNOSIS — Z7189 Other specified counseling: Secondary | ICD-10-CM | POA: Diagnosis not present

## 2023-04-08 DIAGNOSIS — A4101 Sepsis due to Methicillin susceptible Staphylococcus aureus: Principal | ICD-10-CM | POA: Diagnosis present

## 2023-04-08 DIAGNOSIS — F32A Depression, unspecified: Secondary | ICD-10-CM | POA: Insufficient documentation

## 2023-04-08 DIAGNOSIS — Z1152 Encounter for screening for COVID-19: Secondary | ICD-10-CM

## 2023-04-08 DIAGNOSIS — Z79890 Hormone replacement therapy (postmenopausal): Secondary | ICD-10-CM | POA: Diagnosis not present

## 2023-04-08 DIAGNOSIS — T43505A Adverse effect of unspecified antipsychotics and neuroleptics, initial encounter: Secondary | ICD-10-CM | POA: Diagnosis not present

## 2023-04-08 DIAGNOSIS — R569 Unspecified convulsions: Secondary | ICD-10-CM | POA: Diagnosis not present

## 2023-04-08 DIAGNOSIS — R7881 Bacteremia: Secondary | ICD-10-CM | POA: Diagnosis not present

## 2023-04-08 DIAGNOSIS — B957 Other staphylococcus as the cause of diseases classified elsewhere: Secondary | ICD-10-CM | POA: Diagnosis not present

## 2023-04-08 DIAGNOSIS — G2402 Drug induced acute dystonia: Secondary | ICD-10-CM | POA: Diagnosis not present

## 2023-04-08 LAB — COMPREHENSIVE METABOLIC PANEL
ALT: 25 U/L (ref 0–44)
AST: 53 U/L — ABNORMAL HIGH (ref 15–41)
Albumin: 3.5 g/dL (ref 3.5–5.0)
Alkaline Phosphatase: 93 U/L (ref 38–126)
Anion gap: 11 (ref 5–15)
BUN: 28 mg/dL — ABNORMAL HIGH (ref 6–20)
CO2: 22 mmol/L (ref 22–32)
Calcium: 8.8 mg/dL — ABNORMAL LOW (ref 8.9–10.3)
Chloride: 105 mmol/L (ref 98–111)
Creatinine, Ser: 1.14 mg/dL (ref 0.61–1.24)
GFR, Estimated: >60 mL/min (ref >60)
Glucose, Bld: 166 mg/dL — ABNORMAL HIGH (ref 70–99)
Potassium: 3.5 mmol/L (ref 3.5–5.1)
Sodium: 138 mmol/L (ref 135–145)
Total Bilirubin: 0.7 mg/dL (ref <1.2)
Total Protein: 6.7 g/dL (ref 6.5–8.1)

## 2023-04-08 LAB — URINALYSIS, ROUTINE W REFLEX MICROSCOPIC
Bilirubin Urine: NEGATIVE
Glucose, UA: NEGATIVE mg/dL
Hgb urine dipstick: NEGATIVE
Ketones, ur: 5 mg/dL — AB
Leukocytes,Ua: NEGATIVE
Nitrite: NEGATIVE
Protein, ur: 30 mg/dL — AB
Specific Gravity, Urine: 1.027 (ref 1.005–1.030)
Squamous Epithelial / HPF: 0 /[HPF] (ref 0–5)
pH: 6 (ref 5.0–8.0)

## 2023-04-08 LAB — RESP PANEL BY RT-PCR (RSV, FLU A&B, COVID)  RVPGX2
Influenza A by PCR: NEGATIVE
Influenza B by PCR: NEGATIVE
Resp Syncytial Virus by PCR: NEGATIVE
SARS Coronavirus 2 by RT PCR: NEGATIVE

## 2023-04-08 LAB — CBC
HCT: 45.3 % (ref 39.0–52.0)
Hemoglobin: 15 g/dL (ref 13.0–17.0)
MCH: 32.3 pg (ref 26.0–34.0)
MCHC: 33.1 g/dL (ref 30.0–36.0)
MCV: 97.6 fL (ref 80.0–100.0)
Platelets: 302 10*3/uL (ref 150–400)
RBC: 4.64 MIL/uL (ref 4.22–5.81)
RDW: 14.6 % (ref 11.5–15.5)
WBC: 5.6 10*3/uL (ref 4.0–10.5)
nRBC: 0 % (ref 0.0–0.2)

## 2023-04-08 LAB — LACTIC ACID, PLASMA
Lactic Acid, Venous: 1.5 mmol/L (ref 0.5–1.9)
Lactic Acid, Venous: 1.2 mmol/L (ref 0.5–1.9)

## 2023-04-08 LAB — VALPROIC ACID LEVEL: Valproic Acid Lvl: 52 ug/mL (ref 50.0–100.0)

## 2023-04-08 LAB — PROCALCITONIN: Procalcitonin: <0.1 ng/mL

## 2023-04-08 LAB — CK: Total CK: 767 U/L — ABNORMAL HIGH (ref 49–397)

## 2023-04-08 LAB — MAGNESIUM: Magnesium: 2.2 mg/dL (ref 1.7–2.4)

## 2023-04-08 MED ORDER — VANCOMYCIN HCL IN DEXTROSE 1-5 GM/200ML-% IV SOLN
1000.0000 mg | Freq: Once | INTRAVENOUS | Status: DC
Start: 2023-04-08 — End: 2023-04-08

## 2023-04-08 MED ORDER — METRONIDAZOLE 500 MG/100ML IV SOLN
500.0000 mg | Freq: Two times a day (BID) | INTRAVENOUS | Status: DC
  Administered 2023-04-09 – 2023-04-10 (×3): 500 mg via INTRAVENOUS
  Filled 2023-04-08 (×3): qty 100

## 2023-04-08 MED ORDER — DIVALPROEX SODIUM ER 500 MG PO TB24
2000.0000 mg | ORAL_TABLET | Freq: Every day | ORAL | Status: DC
  Administered 2023-04-10 – 2023-04-20 (×11): 2000 mg via ORAL
  Filled 2023-04-08 (×12): qty 4

## 2023-04-08 MED ORDER — SODIUM CHLORIDE 0.9 % IV SOLN: 2.0000 g | Freq: Once | INTRAVENOUS | Status: DC

## 2023-04-08 MED ORDER — LACTULOSE 10 GM/15ML PO SOLN
20.0000 g | Freq: Two times a day (BID) | ORAL | Status: DC
  Administered 2023-04-10 – 2023-04-24 (×23): 20 g via ORAL
  Filled 2023-04-08 (×25): qty 30

## 2023-04-08 MED ORDER — ACETAMINOPHEN 650 MG RE SUPP
650.0000 mg | Freq: Four times a day (QID) | RECTAL | Status: DC | PRN
  Administered 2023-04-18 – 2023-04-22 (×3): 650 mg via RECTAL
  Filled 2023-04-08 (×4): qty 1

## 2023-04-08 MED ORDER — POLYETHYLENE GLYCOL 3350 17 GM/SCOOP PO POWD: 34.0000 g | Freq: Every day | ORAL | Status: DC

## 2023-04-08 MED ORDER — LEVOTHYROXINE SODIUM 50 MCG PO TABS
125.0000 ug | ORAL_TABLET | Freq: Every day | ORAL | Status: DC
  Administered 2023-04-09 – 2023-04-20 (×12): 125 ug via ORAL
  Filled 2023-04-08 (×6): qty 1
  Filled 2023-04-08: qty 3
  Filled 2023-04-08 (×6): qty 1

## 2023-04-08 MED ORDER — ACETAMINOPHEN 500 MG PO TABS
1000.0000 mg | ORAL_TABLET | Freq: Once | ORAL | Status: AC
  Administered 2023-04-08: 1000 mg via ORAL
  Filled 2023-04-08: qty 2

## 2023-04-08 MED ORDER — SIMETHICONE 80 MG PO CHEW: 80.0000 mg | CHEWABLE_TABLET | Freq: Four times a day (QID) | ORAL | Status: DC | PRN

## 2023-04-08 MED ORDER — MAGNESIUM HYDROXIDE 400 MG/5ML PO SUSP: 30.0000 mL | Freq: Every day | ORAL | Status: DC | PRN

## 2023-04-08 MED ORDER — TOPIRAMATE 25 MG PO TABS
75.0000 mg | ORAL_TABLET | Freq: Two times a day (BID) | ORAL | Status: DC
  Administered 2023-04-10 – 2023-04-20 (×20): 75 mg via ORAL
  Filled 2023-04-08 (×24): qty 3

## 2023-04-08 MED ORDER — LACTATED RINGERS IV SOLN
150.0000 mL/h | INTRAVENOUS | Status: AC
  Administered 2023-04-09 (×3): 150 mL/h via INTRAVENOUS

## 2023-04-08 MED ORDER — ATORVASTATIN CALCIUM 20 MG PO TABS
40.0000 mg | ORAL_TABLET | Freq: Every day | ORAL | Status: DC
  Administered 2023-04-10 – 2023-04-13 (×4): 40 mg via ORAL
  Filled 2023-04-08 (×5): qty 2

## 2023-04-08 MED ORDER — LAMOTRIGINE 100 MG PO TABS
100.0000 mg | ORAL_TABLET | Freq: Every day | ORAL | Status: DC
  Administered 2023-04-10 – 2023-04-20 (×10): 100 mg via ORAL
  Filled 2023-04-08 (×11): qty 1

## 2023-04-08 MED ORDER — DICLOFENAC SODIUM 1 % EX GEL: 1.0000 | Freq: Two times a day (BID) | CUTANEOUS | Status: DC | PRN

## 2023-04-08 MED ORDER — VANCOMYCIN HCL IN DEXTROSE 1-5 GM/200ML-% IV SOLN
1000.0000 mg | Freq: Once | INTRAVENOUS | Status: AC
  Administered 2023-04-08: 1000 mg via INTRAVENOUS
  Filled 2023-04-08: qty 200

## 2023-04-08 MED ORDER — MIDODRINE HCL 5 MG PO TABS
2.5000 mg | ORAL_TABLET | Freq: Two times a day (BID) | ORAL | Status: DC
  Administered 2023-04-10 – 2023-04-20 (×19): 2.5 mg via ORAL
  Filled 2023-04-08 (×22): qty 1

## 2023-04-08 MED ORDER — SODIUM CHLORIDE 0.9 % IV SOLN
2.0000 g | Freq: Once | INTRAVENOUS | Status: AC
  Administered 2023-04-08: 2 g via INTRAVENOUS
  Filled 2023-04-08: qty 12.5

## 2023-04-08 MED ORDER — CLONAZEPAM 1 MG PO TABS
1.0000 mg | ORAL_TABLET | Freq: Two times a day (BID) | ORAL | Status: DC | PRN
  Administered 2023-04-09 – 2023-04-11 (×2): 1 mg via ORAL
  Filled 2023-04-08: qty 1
  Filled 2023-04-08: qty 2

## 2023-04-08 MED ORDER — ONDANSETRON HCL 4 MG/2ML IJ SOLN
4.0000 mg | Freq: Four times a day (QID) | INTRAMUSCULAR | Status: DC | PRN
  Administered 2023-04-30: 4 mg via INTRAVENOUS

## 2023-04-08 MED ORDER — LACTATED RINGERS IV BOLUS
1000.0000 mL | Freq: Once | INTRAVENOUS | Status: AC
  Administered 2023-04-08: 1000 mL via INTRAVENOUS

## 2023-04-08 MED ORDER — TRAZODONE HCL 50 MG PO TABS
25.0000 mg | ORAL_TABLET | Freq: Every evening | ORAL | Status: DC | PRN
  Administered 2023-04-11: 25 mg via ORAL
  Filled 2023-04-08 (×2): qty 1

## 2023-04-08 MED ORDER — VALBENAZINE TOSYLATE 40 MG PO CAPS
80.0000 mg | ORAL_CAPSULE | Freq: Every day | ORAL | Status: DC
  Administered 2023-04-10 – 2023-04-19 (×10): 80 mg via ORAL
  Filled 2023-04-08 (×12): qty 2

## 2023-04-08 MED ORDER — ACETAMINOPHEN 325 MG PO TABS
650.0000 mg | ORAL_TABLET | Freq: Four times a day (QID) | ORAL | Status: DC | PRN
  Administered 2023-04-09: 650 mg via ORAL
  Filled 2023-04-08 (×2): qty 2

## 2023-04-08 MED ORDER — LORATADINE 10 MG PO TABS: 10.0000 mg | ORAL_TABLET | Freq: Every day | ORAL | Status: DC | PRN

## 2023-04-08 MED ORDER — LOXAPINE SUCCINATE 5 MG PO CAPS
25.0000 mg | ORAL_CAPSULE | Freq: Three times a day (TID) | ORAL | Status: DC
  Administered 2023-04-10 – 2023-04-20 (×30): 25 mg via ORAL
  Filled 2023-04-08 (×40): qty 5

## 2023-04-08 MED ORDER — ONDANSETRON HCL 4 MG PO TABS: 4.0000 mg | ORAL_TABLET | Freq: Four times a day (QID) | ORAL | Status: DC | PRN

## 2023-04-08 MED ORDER — CHLORHEXIDINE GLUCONATE 0.12 % MT SOLN
7.5000 mL | Freq: Two times a day (BID) | OROMUCOSAL | Status: DC
  Administered 2023-04-10 – 2023-05-11 (×59): 7.5 mL via OROMUCOSAL
  Filled 2023-04-08 (×58): qty 15

## 2023-04-08 MED ORDER — DOCUSATE SODIUM 100 MG PO CAPS
100.0000 mg | ORAL_CAPSULE | Freq: Two times a day (BID) | ORAL | Status: DC
  Administered 2023-04-10 – 2023-04-20 (×18): 100 mg via ORAL
  Filled 2023-04-08 (×23): qty 1

## 2023-04-08 MED ORDER — METRONIDAZOLE 500 MG/100ML IV SOLN
500.0000 mg | Freq: Once | INTRAVENOUS | Status: AC
  Administered 2023-04-08: 500 mg via INTRAVENOUS
  Filled 2023-04-08: qty 100

## 2023-04-08 MED ORDER — ENOXAPARIN SODIUM 40 MG/0.4ML IJ SOSY
40.0000 mg | PREFILLED_SYRINGE | INTRAMUSCULAR | Status: DC
  Administered 2023-04-09 – 2023-05-11 (×33): 40 mg via SUBCUTANEOUS
  Filled 2023-04-08 (×32): qty 0.4

## 2023-04-08 MED ORDER — MIRABEGRON ER 50 MG PO TB24
50.0000 mg | ORAL_TABLET | Freq: Every day | ORAL | Status: DC
  Administered 2023-04-10 – 2023-04-20 (×10): 50 mg via ORAL
  Filled 2023-04-08 (×14): qty 1

## 2023-04-08 MED ORDER — GLYCOPYRROLATE 1 MG PO TABS
1.0000 mg | ORAL_TABLET | Freq: Two times a day (BID) | ORAL | Status: DC
  Administered 2023-04-10 – 2023-04-24 (×24): 1 mg via ORAL
  Filled 2023-04-08 (×31): qty 1

## 2023-04-08 MED ORDER — PAROXETINE HCL 20 MG PO TABS
40.0000 mg | ORAL_TABLET | Freq: Every day | ORAL | Status: DC
  Administered 2023-04-10 – 2023-04-20 (×10): 40 mg via ORAL
  Filled 2023-04-08 (×13): qty 2

## 2023-04-08 MED ORDER — PANTOPRAZOLE SODIUM 40 MG PO TBEC
40.0000 mg | DELAYED_RELEASE_TABLET | Freq: Every day | ORAL | Status: DC
  Administered 2023-04-10 – 2023-04-20 (×10): 40 mg via ORAL
  Filled 2023-04-08 (×12): qty 1

## 2023-04-08 NOTE — Progress Notes (Signed)
ED Pharmacy Antibiotic Sign Off An antibiotic consult was received from an ED provider for cefepime and vancomycin per pharmacy dosing for sepsis/infection of unknown source. A chart review was completed to assess appropriateness.   The following one time order(s) were placed:  Cefepime 2 g IV x 1 Vancomycin 1 g IV x 1  Further antibiotic and/or antibiotic pharmacy consults should be ordered by the admitting provider if indicated.   Thank you for allowing pharmacy to be a part of this patient's care.   Merryl Hacker, [facility]  Clinical Pharmacist 04/08/23 7:01 PM

## 2023-04-08 NOTE — ED Provider Notes (Addendum)
[name] Provider Note    Event Date/Time   First MD Initiated Contact with Patient 04/08/23 1827     (approximate)   History   Chief Complaint Altered Mental Status   HPI  Jared Mayer is a 52 y.o. male with past medical history of schizoaffective disorder, intellectual disability, and drug-induced parkinsonism who presents to the ED for altered mental status.  Patient is competent to the ED by his mother, who is a Engineer, civil (consulting), and states that he has had a decline over about the past week.  She states that he has seemed stiff and more unsteady on his feet with multiple falls.  He has also been more disoriented with repetitive speech pattern and erratic behavior.  He has been more aggressive than usual at his group home, slamming his fists against walls and tables.  He was seen in the ED for psychiatric evaluation this past week, did not have any significant medication changes per mother.  Patient currently denies any complaints.     Physical Exam   Triage Vital Signs: ED Triage Vitals  Encounter Vitals Group     BP      Systolic BP Percentile      Diastolic BP Percentile      Pulse      Resp      Temp      Temp src      SpO2      Weight      Height      Head Circumference      Peak Flow      Pain Score      Pain Loc      Pain Education      Exclude from Growth Chart     Most recent vital signs: Vitals:   04/08/23 2115 04/08/23 2130  BP: 115/62 126/83  Pulse: (!) 107 (!) 111  Resp: (!) 23 (!) 23  Temp:    SpO2: 96% 96%    Constitutional: Alert and appropriately interactive. Eyes: Conjunctivae are normal. Head: Atraumatic. Nose: No congestion/rhinnorhea. Mouth/Throat: Mucous membranes are moist.  Neck: Supple with no meningismus. Cardiovascular: Tachycardic, regular rhythm. Grossly normal heart sounds.  2+ radial pulses bilaterally. Respiratory: Normal respiratory effort.  No retractions. Lungs CTAB. Gastrointestinal: Soft and  nontender. No distention. Musculoskeletal: No lower extremity tenderness nor edema.  Neurologic:  Normal speech and language. No gross focal neurologic deficits are appreciated.  Mild stiffness noted with clonus to bilateral ankles.    ED Results / Procedures / Treatments   Labs (all labs ordered are listed, but only abnormal results are displayed) Labs Reviewed  COMPREHENSIVE METABOLIC PANEL - Abnormal; Notable for the following components:      Result Value   Glucose, Bld 166 (*)    BUN 28 (*)    Calcium 8.8 (*)    AST 53 (*)    All other components within normal limits  URINALYSIS, ROUTINE W REFLEX MICROSCOPIC - Abnormal; Notable for the following components:   Color, Urine YELLOW (*)    APPearance HAZY (*)    Ketones, ur 5 (*)    Protein, ur 30 (*)    Bacteria, UA RARE (*)    All other components within normal limits  CK - Abnormal; Notable for the following components:   Total CK 767 (*)    All other components within normal limits  RESP PANEL BY RT-PCR (RSV, FLU A&B, COVID)  RVPGX2  CULTURE, BLOOD (ROUTINE X 2)  CULTURE,  BLOOD (ROUTINE X 2)  CBC  LACTIC ACID, PLASMA  LACTIC ACID, PLASMA  MAGNESIUM  VALPROIC ACID LEVEL  PROCALCITONIN  LAMOTRIGINE LEVEL  CBG MONITORING, ED   ED ECG REPORT I, Chesley Noon, the attending physician, personally viewed and interpreted this ECG.   Date: 04/08/2023  EKG Time: 22:36  Rate: 104  Rhythm: sinus tachycardia  Axis: Normal  Intervals:none  ST&T Change: None   RADIOLOGY Chest x-ray reviewed and interpreted by me with no infiltrate, edema, or effusion.  PROCEDURES:  Critical Care performed: Yes, see critical care procedure note(s)  .Critical Care  Performed by: Chesley Noon, MD Authorized by: Chesley Noon, MD   Critical care provider statement:    Critical care time (minutes):  30   Critical care time was exclusive of:  Separately billable procedures and treating other patients and teaching time    Critical care was necessary to treat or prevent imminent or life-threatening deterioration of the following conditions:  Sepsis and CNS failure or compromise   Critical care was time spent personally by me on the following activities:  Development of treatment plan with patient or surrogate, discussions with consultants, evaluation of patient's response to treatment, examination of patient, ordering and review of laboratory studies, ordering and review of radiographic studies, ordering and performing treatments and interventions, pulse oximetry, re-evaluation of patient's condition and review of old charts   I assumed direction of critical care for this patient from another provider in my specialty: no     Care discussed with: admitting provider      MEDICATIONS ORDERED IN ED: Medications  lactated ringers bolus 1,000 mL (0 mLs Intravenous Stopped 04/08/23 2116)  acetaminophen (TYLENOL) tablet 1,000 mg (1,000 mg Oral Given 04/08/23 2009)  ceFEPIme (MAXIPIME) 2 g in sodium chloride 0.9 % 100 mL IVPB (0 g Intravenous Stopped 04/08/23 2116)  metroNIDAZOLE (FLAGYL) IVPB 500 mg (0 mg Intravenous Stopped 04/08/23 2220)  vancomycin (VANCOCIN) IVPB 1000 mg/200 mL premix (0 mg Intravenous Stopped 04/08/23 2116)     IMPRESSION / MDM / ASSESSMENT AND PLAN / ED COURSE  I reviewed the triage vital signs and the nursing notes.                              52 y.o. male with past medical history of schizoaffective disorder, intellectual disability, and drug-induced parkinsonism who presents to the ED with multiple falls, stiffness in his extremities, change in behavior with increased confusion and aggression.  Patient's presentation is most consistent with acute presentation with potential threat to life or bodily function.  Differential diagnosis includes, but is not limited to, sepsis, UTI, pneumonia, electrolyte abnormality, Depakote toxicity, neuroleptic malignant syndrome, serotonin  syndrome.  Patient nontoxic-appearing and in no acute distress, however vitals are concerning for fever and tachycardia.  Presentation concerning for sepsis and we will start on broad-spectrum antibiotics, hydrate with IV fluids.  No findings concerning for meningitis, chest x-ray and urinalysis pending at this time.  I would also consider neuroleptic malignant syndrome or serotonin syndrome.  Clonus seems consistent with serotonin syndrome and he does not seem to have leadpipe rigidity concerning for neuroleptic malignant syndrome.  Do not feel benzodiazepines are needed at this time, will hydrate with IV fluids and reassess following labs.  He did have CT head performed 5 days ago following onset of his recent decline which was unremarkable.  Do not feel repeat imaging indicated at this time as he has not  had significant traumatic injury.  Labs show no significant anemia, leukocytosis, electrolyte abnormality, or AKI.  He does have mild elevation in his CK which could point towards neuroleptic malignant syndrome or serotonin syndrome, although he continues to display no signs of leadpipe rigidity on reassessment.  Lactic acid within normal limits, LFTs are unremarkable.  Urinalysis also without signs of infection, no clear source of sepsis at this time given unremarkable chest x-ray and no specific symptoms of infection.  Case discussed with hospitalist for admission.      FINAL CLINICAL IMPRESSION(S) / ED DIAGNOSES   Final diagnoses:  Altered mental status, unspecified altered mental status type  Multiple falls  Sepsis without acute organ dysfunction, due to unspecified organism [facility])     Rx / DC Orders   ED Discharge Orders     None        Note:  This document was prepared using Dragon voice recognition software and may include unintentional dictation errors.   Chesley Noon, MD 04/08/23 2227    Chesley Noon, MD 04/08/23 2248

## 2023-04-08 NOTE — Progress Notes (Signed)
CODE SEPSIS - PHARMACY COMMUNICATION  **Broad Spectrum Antibiotics should be administered within 1 hour of Sepsis diagnosis**  Time Code Sepsis Called/Page Received: 1900  Antibiotics Ordered: Cefepime, metronidazole, vancomycin  Time of 1st antibiotic administration: 1947  Additional action taken by pharmacy: No nurse logged into patient; called ED main phone to get transferred to nurse directly, no answer.  If necessary, Name of Provider/Nurse Contacted: N/A    Merryl Hacker ,PharmD Clinical Pharmacist  04/08/2023  7:01 PM

## 2023-04-08 NOTE — Progress Notes (Signed)
Pharmacy Antibiotic Note  Jared Mayer is a 52 y.o. male admitted on 04/08/2023 with infection of unknown source.  Pharmacy has been consulted for Cefepime & Vancomycin dosing for 7 days.  Plan: Cefepime 2 gm q8hr per indication & renal fxn.  Pt given Vancomycin 1000 mg once, ordered additional 1000 mg dose. Vancomycin 1750 mg IV Q 24 hrs. Goal AUC 400-550. Expected AUC: 481.1 SCr used: 1.14  Pharmacy will continue to follow and will adjust abx dosing whenever warranted.  Temp (24hrs), Avg:100.4 F (38 C), Min:100.4 F (38 C), Max:100.4 F (38 C)   Recent Labs  Lab 04/03/23 0020 04/08/23 1845 04/08/23 1946 04/08/23 2114  WBC 5.3 5.6  --   --   CREATININE 0.72 1.14  --   --   LATICACIDVEN  --   --  1.5 1.2    CrCl cannot be calculated (Unknown ideal weight.).    No Known Allergies  Antimicrobials this admission: 11/28 Cefepime >> x 7 days 11/28 Vancomycin >> x 7 days 11/28 Metronidazole >> x 7 days  Microbiology results: 11/28 BCx: Pending  Thank you for allowing pharmacy to be a part of this patient's care.  Otelia Sergeant, PharmD, [facility] 04/08/2023 11:16 PM

## 2023-04-08 NOTE — Assessment & Plan Note (Signed)
-   Will continue Ativan and Paxil.

## 2023-04-08 NOTE — Sepsis Progress Note (Addendum)
Elink monitoring for the code sepsis protocol.   2018: No RN assigned to patient to secure chat about needing labs drawn for code sepsis

## 2023-04-08 NOTE — Assessment & Plan Note (Addendum)
-   We will continue his mood stabilizers and psychotropic medications for now. - We will utilize IM Geodon for increased agitation.

## 2023-04-08 NOTE — Assessment & Plan Note (Addendum)
-   We will continue statin therapy. 

## 2023-04-08 NOTE — ED Triage Notes (Signed)
Pt comes via POV from group home with c/o AMS and incontinence. Pt has had multiple falls and just not acting right per family. Pt has had bloodwork done at PCP but no results yet. Family states pt was ok on Wed and then this started on Friday.

## 2023-04-08 NOTE — Assessment & Plan Note (Addendum)
-   This is associated with fever of unknown etiology. - The patient was admitted to a medical telemetry bed. - We will continue broad-spectrum IV antibiotic therapy pending follow-up on blood cultures for potential sepsis of unclear etiology that is manifested by fever, tachycardia and tachypnea.  Differential diagnosis would include neuroleptic malignant syndrome given elevated CK level though less likely. - Will monitor mental status. - Psychiatry and neurology consult will be obtained. - I notified Dr. Renato Shin and Otelia Limes about the patient.

## 2023-04-08 NOTE — Assessment & Plan Note (Signed)
-  Continue Synthroid and check TSH level.

## 2023-04-08 NOTE — Assessment & Plan Note (Addendum)
-   This could be associated with recurrent falls. - He will be placed on hydration with IV lactated ringer. - PT consult to be obtained.

## 2023-04-08 NOTE — H&P (Signed)
[place]   PATIENT NAME: Jared Mayer    MR#:  [id number]  DATE OF BIRTH:  Oct 03, 1970  DATE OF ADMISSION:  04/08/2023  PRIMARY CARE PHYSICIAN: [facility]   Patient is coming from: Group home.  REQUESTING/REFERRING PHYSICIAN: Delbert Phenix, MD  CHIEF COMPLAINT:   Chief Complaint  Patient presents with   Altered Mental Status    HISTORY OF PRESENT ILLNESS:  Jared Mayer is a 52 y.o. Caucasian male with medical history significant for schizoaffective disorder, bipolar type, dyslipidemia, drug-induced parkinsonism, and hypothyroidism, who presented to the emergency room with acute onset of altered mental status with recent recurrent falls and unsteady gait with worsening disorientation and repetitive speech pattern as well as erratic behavior.  He has been more aggressive than usual at his group home per his mother who is a Engineer, civil (consulting) and was accompanying him initially.  He has been slamming his fist against walls and tables.  He was seen in the ED for psychiatric evaluation recently and did not have any significant medication changes per his mother.  He denied any chest pain or palpitation.  No cough or wheezing or dyspnea.  No dysuria, oliguria or hematuria or flank pain.  He was noted to have a fever of 38 C and denied chills.  No headache or dizziness or blurred vision.  No paresthesias or new focal muscle weakness.  ED Course: Upon presentation to the emergency room, BP was 122/90 with heart rate 120 and respiratory rate of 18 and later 24 with temperature of 100.4/38.  Labs revealed a BUN of 28 with a glucose of 166 and calcium 8.8, AST 53 and otherwise unremarkable CMP.  Magnesium level was 2.2.  Total CK was elevated at 767 and lactic acid was 1.5 then 1.2.  Procalcitonin was less than 0.1.  CBC was within normal.  Valproic acid was 52. EKG as reviewed by me : EKG showed sinus tachycardia with rate 104 with suspected right atrial enlargement and right axis  deviation with low voltage QRS. Imaging: Portable chest x-ray showed no acute cardiopulmonary disease.  The patient was given 1 L bolus of IV lactated Ringer as well as IV cefepime, vancomycin and Flagyl.  He will be admitted to a medical telemetry bed for further evaluation and management. PAST MEDICAL HISTORY:   Past Medical History:  Diagnosis Date   Dementia (HCC)    Dyslipidemia    Hypothyroid    Manic behavior (HCC)    Mentally disabled    Schizoaffective disorder, bipolar type (HCC)   -Drug-induced parkinsonism.  PAST SURGICAL HISTORY:  History reviewed. No pertinent surgical history. Unknown previous surgeries. SOCIAL HISTORY:   Social History   Tobacco Use   Smoking status: Never   Smokeless tobacco: Never  Substance Use Topics   Alcohol use: Never    FAMILY HISTORY:   Family History  Problem Relation Age of Onset   Hypothyroidism Mother    Sarcoidosis Mother     DRUG ALLERGIES:  No Known Allergies  REVIEW OF SYSTEMS:   ROS As per history of present illness. All pertinent systems were reviewed above. Constitutional, HEENT, cardiovascular, respiratory, GI, GU, musculoskeletal, neuro, psychiatric, endocrine, integumentary and hematologic systems were reviewed and are otherwise negative/unremarkable except for positive findings mentioned above in the HPI.   MEDICATIONS AT HOME:   Prior to Admission medications   Medication Sig Start Date End Date Taking? Authorizing Provider  acetaminophen (TYLENOL) 325 MG tablet Take 650 mg by mouth every  4 (four) hours as needed.    [provider]  atorvastatin (LIPITOR) 40 MG tablet Take 1 tablet (40 mg total) by mouth daily. 03/17/21   Zigmund Nelton., MD  bisacodyl (DULCOLAX) 10 MG suppository Place 10 mg rectally daily as needed. 02/09/23   [provider]  buPROPion (WELLBUTRIN XL) 150 MG 24 hr tablet Take 300 mg by mouth daily. Patient not taking: Reported on 04/03/2023    [provider]  chlorhexidine (PERIDEX) 0.12 % solution 7.5 mLs by Mouth Rinse route 2 (two) times daily. After brushing teeth. Do not swallow or rinse with water or mouthwash after. 01/30/21   [provider]  cholecalciferol (VITAMIN D3) 25 MCG (1000 UT) tablet Take 1,000 Units by mouth daily. Patient not taking: Reported on 04/03/2023    [provider]  clonazePAM (KLONOPIN) 1 MG tablet Take 1 mg by mouth 2 (two) times daily as needed. 03/15/23   [provider]  diclofenac Sodium (VOLTAREN) 1 % GEL Apply 1 Application topically 2 (two) times daily as needed. To both kneew 03/24/23   [provider]  divalproex (DEPAKOTE ER) 500 MG 24 hr tablet Take 500-2,000 mg by mouth See admin instructions. 500 mg every morning and 2000 mg at bedtime Patient not taking: Reported on 04/03/2023    [provider]  divalproex (DEPAKOTE) 500 MG DR tablet Take 2,000 mg by mouth at bedtime.    [provider]  docusate sodium (COLACE) 100 MG capsule Take 100 mg by mouth 2 (two) times daily.    [provider]  fexofenadine (ALLEGRA) 180 MG tablet Take 180 mg by mouth daily as needed for allergies.     [provider]  fluticasone (FLONASE) 50 MCG/ACT nasal spray Place 2 sprays into the nose daily as needed for allergies or rhinitis.  Patient not taking: Reported on 04/03/2023    [provider]  glycopyrrolate (ROBINUL) 1 MG tablet Take 1 mg by mouth 2 (two) times daily. 03/05/21   [provider]  hydrOXYzine (ATARAX/VISTARIL) 25 MG tablet Take 25 mg by mouth 2 (two) times daily. Patient not taking: Reported on 04/03/2023 03/10/21   [provider]  INGREZZA 80 MG capsule Take 80 mg by mouth at bedtime. 02/17/21   [provider]  ketoconazole (NIZORAL) 2 % cream SMARTSIG:1 Topical Daily 10/14/21   [provider]  lactulose (CHRONULAC) 10 GM/15ML solution Take 20 g by mouth 2 (two) times daily.  03/24/23   [provider]  lamoTRIgine (LAMICTAL) 100 MG tablet Take 100 mg by mouth daily.    [provider]  levothyroxine (SYNTHROID) 112 MCG tablet Take 112 mcg by mouth daily. On an empty stomach with a glass of water 30-60 minutes before breakfast Patient not taking: Reported on 04/03/2023    [provider]  levothyroxine (SYNTHROID) 125 MCG tablet Take 125 mcg by mouth daily. 03/15/23   [provider]  linaclotide (LINZESS) 290 MCG CAPS capsule Take 290 mcg by mouth daily. Patient not taking: Reported on 04/03/2023    [provider]  LORazepam (ATIVAN) 0.5 MG tablet Take 0.5-1 mg by mouth 2 (two) times daily. Take 1 tablet (0.5 mg) each morning and 2 tablets (1 mg) at bedtime Patient not taking: Reported on 04/03/2023 03/10/21   [provider]  loxapine (LOXITANE) 25 MG capsule Take 25 mg by mouth 3 (three) times daily. 03/15/23   [provider]  Melatonin 10 MG TABS Take 10 mg by  mouth at bedtime.    [provider]  midodrine (PROAMATINE) 2.5 MG tablet Take 2.5 mg by mouth 2 (two) times daily. 03/29/23   [provider]  mirabegron ER (MYRBETRIQ) 50 MG TB24 tablet Take 1 tablet (50 mg total) by mouth daily. 10/26/22   Vaillancourt, Samantha, PA-C  OLANZapine (ZYPREXA) 15 MG tablet Take 30 mg by mouth at bedtime. Patient not taking: Reported on 04/03/2023    [provider]  Omega-3 Fatty Acids (FISH OIL) 1000 MG CAPS Take 1,000 mg by mouth 2 (two) times daily. Patient not taking: Reported on 04/03/2023    [provider]  omeprazole (PRILOSEC) 40 MG capsule Take 40 mg by mouth daily. 10/21/21   [provider]  PARoxetine (PAXIL) 40 MG tablet Take 40 mg by mouth daily. 10/21/21   [provider]  polyethylene glycol powder (GLYCOLAX/MIRALAX) 17 GM/SCOOP powder Take 34 g by mouth daily. Mix with 8 ounces of water 11/19/20   [provider]  simethicone (MYLICON)  80 MG chewable tablet Chew 80 mg by mouth every 6 (six) hours as needed for flatulence.    [provider]  sodium fluoride (PREVIDENT 5000 PLUS) 1.1 % CREA dental cream Place 1 application onto teeth 2 (two) times daily.    [provider]  sucralfate (CARAFATE) 1 g tablet Take 1 g by mouth 2 (two) times daily. Before meals Patient not taking: Reported on 04/03/2023    [provider]  SYSTANE COMPLETE 0.6 % SOLN Place 1 drop into both eyes 3 (three) times daily as needed for dry eyes. 01/21/21   [provider]  topiramate (TOPAMAX) 25 MG tablet Take 75 mg by mouth 2 (two) times daily.    [provider]  triamcinolone cream (KENALOG) 0.1 % Apply 1 application  topically 2 (two) times daily. To redness on face    [provider]      VITAL SIGNS:  Blood pressure 126/83, pulse (!) 111, temperature (!) 100.4 F (38 C), temperature source Rectal, resp. rate (!) 23, SpO2 96%.  PHYSICAL EXAMINATION:  Physical Exam  GENERAL:  52 y.o.-year-old Caucasian male patient lying in the bed with no acute distress.  EYES: Pupils equal, round, reactive to light and accommodation. No scleral icterus. Extraocular muscles intact.  HEENT: Head atraumatic, normocephalic. Oropharynx and nasopharynx clear.  NECK:  Supple, no jugular venous distention. No thyroid enlargement, no tenderness.  LUNGS: Normal breath sounds bilaterally, no wheezing, rales,rhonchi or crepitation. No use of accessory muscles of respiration.  CARDIOVASCULAR: Regular rate and rhythm, S1, S2 normal. No murmurs, rubs, or gallops.  ABDOMEN: Soft, nondistended, nontender. Bowel sounds present. No organomegaly or mass.  EXTREMITIES: No pedal edema, cyanosis, or clubbing.  NEUROLOGIC: Cranial nerves II through XII are intact. Muscle strength 5/5 in all extremities. Sensation intact. Gait not checked.  PSYCHIATRIC: The patient is alert and cooperative.  He is having repetitive speech.   Apathetic affect and good eye contact. SKIN: No obvious rash, lesion, or ulcer.   LABORATORY PANEL:   CBC Recent Labs  Lab 04/08/23 1845  WBC 5.6  HGB 15.0  HCT 45.3  PLT 302   ------------------------------------------------------------------------------------------------------------------  Chemistries  Recent Labs  Lab 04/08/23 1845 04/08/23 1946  NA 138  --   K 3.5  --   CL 105  --   CO2 22  --   GLUCOSE 166*  --   BUN 28*  --   CREATININE 1.14  --   CALCIUM 8.8*  --  MG  --  2.2  AST 53*  --   ALT 25  --   ALKPHOS 93  --   BILITOT 0.7  --    ------------------------------------------------------------------------------------------------------------------  Cardiac Enzymes No results for input(s): "TROPONINI" in the last 168 hours. ------------------------------------------------------------------------------------------------------------------  RADIOLOGY:  DG Chest Portable 1 View  Result Date: 04/08/2023 CLINICAL DATA:  Fever EXAM: PORTABLE CHEST 1 VIEW COMPARISON:  03/10/2021 FINDINGS: Stable mild elevation of right hemidiaphragm with right-sided volume loss. Lungs are clear. No pneumothorax or pleural effusion. Cardiac size within normal limits. Pulmonary vascularity is normal. No acute bone abnormality. IMPRESSION: No active disease. Electronically Signed   By: Helyn Numbers M.D.   On: 04/08/2023 19:27      IMPRESSION AND PLAN:  Assessment and Plan: * Altered mental status - This is associated with fever of unknown etiology. - The patient was admitted to a medical telemetry bed. - We will continue broad-spectrum IV antibiotic therapy pending follow-up on blood cultures for potential sepsis of unclear etiology that is manifested by fever, tachycardia and tachypnea.  Differential diagnosis would include neuroleptic malignant syndrome given elevated CK level though less likely. - Will monitor mental status. - Psychiatry and neurology consult will be  obtained. - I notified Dr. Renato Shin and Otelia Limes about the patient.  Rhabdomyolysis - This could be associated with recurrent falls. - He will be placed on hydration with IV lactated ringer. - PT consult to be obtained.  Schizoaffective disorder, bipolar type (HCC) - We will continue his mood stabilizers and psychotropic medications for now. - We will utilize IM Geodon for increased agitation.  Dyslipidemia - We will continue statin therapy.  Hypothyroidism - Continue Synthroid and check TSH level.  Anxiety and depression - Will continue Ativan and Paxil.    DVT prophylaxis: Lovenox. Advanced Care Planning:  Code Status: full code. Family Communication:  The plan of care was discussed in details with the patient (and family). I answered all questions. The patient agreed to proceed with the above mentioned plan. Further management will depend upon hospital course. Disposition Plan: Back to previous home environment Consults called: Psychiatry and neurology. All the records are reviewed and case discussed with ED provider.  Status is: Inpatient  At the time of the admission, it appears that the appropriate admission status for this patient is inpatient.  This is judged to be reasonable and necessary in order to provide the required intensity of service to ensure the patient's safety given the presenting symptoms, physical exam findings and initial radiographic and laboratory data in the context of comorbid conditions.  The patient requires inpatient status due to high intensity of service, high risk of further deterioration and high frequency of surveillance required.  I certify that at the time of admission, it is my clinical judgment that the patient will require inpatient hospital care extending more than 2 midnights.                            Dispo: The patient is from: Home              Anticipated d/c is to: Home              Patient currently is not medically stable to  d/c.              Difficult to place patient: No  Hannah Beat M.D on 04/08/2023 at 11:29 PM  [facility]   From 7 PM-7  AM, contact night-coverage [email or web address]  CC: Primary care physician; [facility]

## 2023-04-09 DIAGNOSIS — G9340 Encephalopathy, unspecified: Secondary | ICD-10-CM

## 2023-04-09 LAB — PROTIME-INR
INR: 1.1 (ref 0.8–1.2)
Prothrombin Time: 14.5 s (ref 11.4–15.2)

## 2023-04-09 LAB — BASIC METABOLIC PANEL
Anion gap: 9 (ref 5–15)
BUN: 22 mg/dL — ABNORMAL HIGH (ref 6–20)
CO2: 24 mmol/L (ref 22–32)
Calcium: 8.1 mg/dL — ABNORMAL LOW (ref 8.9–10.3)
Chloride: 106 mmol/L (ref 98–111)
Creatinine, Ser: 0.76 mg/dL (ref 0.61–1.24)
GFR, Estimated: >60 mL/min (ref >60)
Glucose, Bld: 82 mg/dL (ref 70–99)
Potassium: 3.3 mmol/L — ABNORMAL LOW (ref 3.5–5.1)
Sodium: 139 mmol/L (ref 135–145)

## 2023-04-09 LAB — CBC
HCT: 38.6 % — ABNORMAL LOW (ref 39.0–52.0)
Hemoglobin: 13.2 g/dL (ref 13.0–17.0)
MCH: 32.4 pg (ref 26.0–34.0)
MCHC: 34.2 g/dL (ref 30.0–36.0)
MCV: 94.6 fL (ref 80.0–100.0)
Platelets: 258 10*3/uL (ref 150–400)
RBC: 4.08 MIL/uL — ABNORMAL LOW (ref 4.22–5.81)
RDW: 14.4 % (ref 11.5–15.5)
WBC: 5.5 10*3/uL (ref 4.0–10.5)
nRBC: 0 % (ref 0.0–0.2)

## 2023-04-09 LAB — PROCALCITONIN: Procalcitonin: <0.1 ng/mL

## 2023-04-09 LAB — CORTISOL-AM, BLOOD: Cortisol - AM: 9.4 ug/dL (ref 6.7–22.6)

## 2023-04-09 LAB — HIV ANTIBODY (ROUTINE TESTING W REFLEX): HIV Screen 4th Generation wRfx: NONREACTIVE

## 2023-04-09 MED ORDER — SODIUM CHLORIDE 0.9 % IV SOLN
2.0000 g | INTRAVENOUS | Status: DC
  Administered 2023-04-10 (×2): 2 g via INTRAVENOUS
  Filled 2023-04-09 (×2): qty 20

## 2023-04-09 MED ORDER — VANCOMYCIN HCL IN DEXTROSE 1-5 GM/200ML-% IV SOLN
1000.0000 mg | Freq: Once | INTRAVENOUS | Status: AC
  Administered 2023-04-09: 1000 mg via INTRAVENOUS
  Filled 2023-04-09: qty 200

## 2023-04-09 MED ORDER — SODIUM CHLORIDE 0.9 % IV SOLN
2.0000 g | Freq: Three times a day (TID) | INTRAVENOUS | Status: DC
  Administered 2023-04-09 (×2): 2 g via INTRAVENOUS
  Filled 2023-04-09 (×2): qty 12.5

## 2023-04-09 MED ORDER — POLYETHYLENE GLYCOL 3350 17 G PO PACK
34.0000 g | PACK | Freq: Every day | ORAL | Status: DC
  Administered 2023-04-10 – 2023-04-20 (×10): 34 g via ORAL
  Filled 2023-04-09 (×12): qty 2

## 2023-04-09 MED ORDER — MORPHINE SULFATE (PF) 2 MG/ML IV SOLN: 2.0000 mg | INTRAVENOUS | Status: DC | PRN

## 2023-04-09 MED ORDER — DOXYCYCLINE HYCLATE 100 MG IV SOLR
100.0000 mg | Freq: Two times a day (BID) | INTRAVENOUS | Status: DC
  Administered 2023-04-09 – 2023-04-11 (×4): 100 mg via INTRAVENOUS
  Filled 2023-04-09 (×6): qty 100

## 2023-04-09 MED ORDER — POTASSIUM CHLORIDE CRYS ER 20 MEQ PO TBCR
40.0000 meq | EXTENDED_RELEASE_TABLET | Freq: Once | ORAL | Status: DC
  Filled 2023-04-09: qty 2

## 2023-04-09 MED ORDER — METOPROLOL TARTRATE 5 MG/5ML IV SOLN
5.0000 mg | Freq: Three times a day (TID) | INTRAVENOUS | Status: DC | PRN
  Administered 2023-04-18 – 2023-04-27 (×3): 5 mg via INTRAVENOUS
  Filled 2023-04-09 (×5): qty 5

## 2023-04-09 MED ORDER — VANCOMYCIN HCL 1750 MG/350ML IV SOLN: 1750.0000 mg | INTRAVENOUS | Status: DC

## 2023-04-09 MED ORDER — NITROGLYCERIN 0.4 MG SL SUBL: 0.4000 mg | SUBLINGUAL_TABLET | SUBLINGUAL | Status: DC | PRN

## 2023-04-09 NOTE — ED Notes (Signed)
Pt transported to room 32 with all belongings.

## 2023-04-09 NOTE — ED Notes (Signed)
RN to bedside to give pt morning medications. Pt alert and sitting up in bed. Pt given water to drink and tolerated well. Pt attempted to take medications out of cup but no meds from the cup entered pt's mouth. Pt started drinking water again and began to cough. Pt stopped coughing and RN attempted to see if pt could tolerate apple sauce. Small amount of apple sauce with no medications given to pt. Pt not swallowing medications. RN used suction to suction applesauce out of pt's mouth. MD made aware.

## 2023-04-09 NOTE — Progress Notes (Signed)
PROGRESS NOTE    Jared Mayer  RUE:[id number] DOB: [birth year] DOA: 04/08/2023 PCP: [facility]  Assessment & Plan:   Principal Problem:   Altered mental status Active Problems:   Rhabdomyolysis   Schizoaffective disorder, bipolar type (HCC)   Dyslipidemia   Hypothyroidism   Anxiety and depression   Sepsis without acute organ dysfunction (HCC)   Multiple falls  Assessment and Plan: Encephalopathy: etiology unclear. Blood cxs NGTD. CXR was neg. Urine drug was positive for benzos. Alcohol < 10. CK was initially > 2,000. Psych evaluated pt and stated pt did not meet inpatient psych criteria. Sepsis r/o currently as no source found currently but spiked a fever so will continue on empiric abxs. D/c IV cefepime and start IV rocephin. Continue flagyl, doxy.   Rhabdomyolysis: CK is still elevated but trending down. Continue on IVFs.   Schizoaffective disorder:  bipolar type. Continue on home dose of lamictal, loxapine, paroxetine. Does not meet inpatient psych criteria as per psych   Likely dysphagia: NPO. Speech consulted   HLD: continue on statin    Hypothyroidism: continue on levothyroxine    Depression: severity unknown. Continue on home dose of paroxetine   Anxiety: severity unknown. Continue on home dose of klonopin    DVT prophylaxis: lovenox  Code Status: full  Family Communication:  Disposition Plan: unclear  Level of care: Telemetry Medical Consultants:    Procedures:   Antimicrobials:    Subjective: Pt is confused   Objective: Vitals:   04/09/23 0400 04/09/23 0430 04/09/23 0521 04/09/23 0744  BP: 115/65 114/70  99/64  Pulse: (!) 102 (!) 106  91  Resp: 17 (!) 21  (!) 21  Temp:   98.1 F (36.7 C)   TempSrc:   Oral   SpO2: 98% 96%  97%  Weight:        Intake/Output Summary (Last 24 hours) at 04/09/2023 0829 Last data filed at 04/09/2023 0300 Gross per 24 hour  Intake --  Output 300 ml  Net -300 ml   Filed Weights   04/08/23 2358   Weight: 77.4 kg    Examination:  General exam: Appears uncomfortable  Respiratory system: decreased breath sounds  Cardiovascular system: S1 & S2+. No rubs, gallops or clicks. Gastrointestinal system: Abdomen is nondistended, soft and nontender. Normal bowel sounds heard. Central nervous system: awake. Unable to answer question appropriately  Psychiatry: Judgement and insight appears poor      Data Reviewed: I have personally reviewed following labs and imaging studies  CBC: Recent Labs  Lab 04/03/23 0020 04/08/23 1845 04/09/23 0450  WBC 5.3 5.6 5.5  HGB 13.4 15.0 13.2  HCT 40.0 45.3 38.6*  MCV 95.7 97.6 94.6  PLT 254 302 258   Basic Metabolic Panel: Recent Labs  Lab 04/03/23 0020 04/08/23 1845 04/08/23 1946 04/09/23 0450  NA 137 138  --  139  K 3.6 3.5  --  3.3*  CL 104 105  --  106  CO2 25 22  --  24  GLUCOSE 92 166*  --  82  BUN 23* 28*  --  22*  CREATININE 0.72 1.14  --  0.76  CALCIUM 8.4* 8.8*  --  8.1*  MG  --   --  2.2  --    GFR: Estimated Creatinine Clearance: 104.5 mL/min (by C-G formula based on SCr of 0.76 mg/dL). Liver Function Tests: Recent Labs  Lab 04/03/23 0020 04/08/23 1845  AST 28 53*  ALT 22 25  ALKPHOS 72 93  BILITOT  0.6 0.7  PROT 6.6 6.7  ALBUMIN 3.5 3.5   No results for input(s): "LIPASE", "AMYLASE" in the last 168 hours. No results for input(s): "AMMONIA" in the last 168 hours. Coagulation Profile: Recent Labs  Lab 04/09/23 0450  INR 1.1   Cardiac Enzymes: Recent Labs  Lab 04/08/23 1946  CKTOTAL 767*   BNP (last 3 results) No results for input(s): "PROBNP" in the last 8760 hours. HbA1C: No results for input(s): "HGBA1C" in the last 72 hours. CBG: No results for input(s): "GLUCAP" in the last 168 hours. Lipid Profile: No results for input(s): "CHOL", "HDL", "LDLCALC", "TRIG", "CHOLHDL", "LDLDIRECT" in the last 72 hours. Thyroid Function Tests: No results for input(s): "TSH", "T4TOTAL", "FREET4", "T3FREE",  "THYROIDAB" in the last 72 hours. Anemia Panel: No results for input(s): "VITAMINB12", "FOLATE", "FERRITIN", "TIBC", "IRON", "RETICCTPCT" in the last 72 hours. Sepsis Labs: Recent Labs  Lab 04/08/23 1946 04/08/23 2114 04/09/23 0450  PROCALCITON <0.10  --  <0.10  LATICACIDVEN 1.5 1.2  --     Recent Results (from the past 240 hour(s))  Culture, blood (routine x 2)     Status: None (Preliminary result)   Collection Time: 04/08/23  7:46 PM   Specimen: BLOOD  Result Value Ref Range Status   Specimen Description BLOOD RIGHT ANTECUBITAL  Final   Special Requests   Final    BOTTLES DRAWN AEROBIC AND ANAEROBIC Blood Culture adequate volume   Culture   Final    NO GROWTH < 12 HOURS Performed at [facility], [address] [place], [place] [address]    Report Status PENDING  Incomplete  Resp panel by RT-PCR (RSV, Flu A&B, Covid) Urine, Clean Catch     Status: None   Collection Time: 04/08/23  7:46 PM   Specimen: Urine, Clean Catch; Nasal Swab  Result Value Ref Range Status   SARS Coronavirus 2 by RT PCR NEGATIVE NEGATIVE Final    Comment: (NOTE) SARS-CoV-2 target nucleic acids are NOT DETECTED.  The SARS-CoV-2 RNA is generally detectable in upper respiratory specimens during the acute phase of infection. The lowest concentration of SARS-CoV-2 viral copies this assay can detect is 138 copies/mL. A negative result does not preclude SARS-Cov-2 infection and should not be used as the sole basis for treatment or other patient management decisions. A negative result may occur with  improper specimen collection/handling, submission of specimen other than nasopharyngeal swab, presence of viral mutation(s) within the areas targeted by this assay, and inadequate number of viral copies(<138 copies/mL). A negative result must be combined with clinical observations, patient history, and epidemiological information. The expected result is Negative.  Fact Sheet for Patients:   BloggerCourse.com  Fact Sheet for Healthcare Providers:  SeriousBroker.it  This test is no t yet approved or cleared by the [place] FDA and  has been authorized for detection and/or diagnosis of SARS-CoV-2 by FDA under an Emergency Use Authorization (EUA). This EUA will remain  in effect (meaning this test can be used) for the duration of the COVID-19 declaration under Section 564(b)(1) of the Act, 21 U.S.C.section 360bbb-3(b)(1), unless the authorization is terminated  or revoked sooner.       Influenza A by PCR NEGATIVE NEGATIVE Final   Influenza B by PCR NEGATIVE NEGATIVE Final    Comment: (NOTE) The Xpert Xpress SARS-CoV-2/FLU/RSV plus assay is intended as an aid in the diagnosis of influenza from Nasopharyngeal swab specimens and should not be used as a sole basis for treatment. Nasal washings and aspirates are  unacceptable for Xpert Xpress SARS-CoV-2/FLU/RSV testing.  Fact Sheet for Patients: BloggerCourse.com  Fact Sheet for Healthcare Providers: SeriousBroker.it  This test is not yet approved or cleared by the [place] FDA and has been authorized for detection and/or diagnosis of SARS-CoV-2 by FDA under an Emergency Use Authorization (EUA). This EUA will remain in effect (meaning this test can be used) for the duration of the COVID-19 declaration under Section 564(b)(1) of the Act, 21 U.S.C. section 360bbb-3(b)(1), unless the authorization is terminated or revoked.     Resp Syncytial Virus by PCR NEGATIVE NEGATIVE Final    Comment: (NOTE) Fact Sheet for Patients: BloggerCourse.com  Fact Sheet for Healthcare Providers: SeriousBroker.it  This test is not yet approved or cleared by the [place] FDA and has been authorized for detection and/or diagnosis of SARS-CoV-2 by FDA under an Emergency Use  Authorization (EUA). This EUA will remain in effect (meaning this test can be used) for the duration of the COVID-19 declaration under Section 564(b)(1) of the Act, 21 U.S.C. section 360bbb-3(b)(1), unless the authorization is terminated or revoked.  Performed at [facility], [address] [place], [place] [address]   Culture, blood (routine x 2)     Status: None (Preliminary result)   Collection Time: 04/08/23  8:11 PM   Specimen: BLOOD  Result Value Ref Range Status   Specimen Description BLOOD BLOOD LEFT ARM  Final   Special Requests   Final    BOTTLES DRAWN AEROBIC AND ANAEROBIC Blood Culture results may not be optimal due to an excessive volume of blood received in culture bottles   Culture   Final    NO GROWTH < 12 HOURS Performed at [facility], [address] [place], [place] [address]    Report Status PENDING  Incomplete         Radiology Studies: DG Chest Portable 1 View  Result Date: 04/08/2023 CLINICAL DATA:  Fever EXAM: PORTABLE CHEST 1 VIEW COMPARISON:  03/10/2021 FINDINGS: Stable mild elevation of right hemidiaphragm with right-sided volume loss. Lungs are clear. No pneumothorax or pleural effusion. Cardiac size within normal limits. Pulmonary vascularity is normal. No acute bone abnormality. IMPRESSION: No active disease. Electronically Signed   By: Helyn Numbers M.D.   On: 04/08/2023 19:27        Scheduled Meds:  atorvastatin  40 mg Oral Daily   chlorhexidine  7.5 mL Mouth Rinse BID   divalproex  2,000 mg Oral QHS   docusate sodium  100 mg Oral BID   enoxaparin (LOVENOX) injection  40 mg Subcutaneous Q24H   glycopyrrolate  1 mg Oral BID   lactulose  20 g Oral BID   lamoTRIgine  100 mg Oral Daily   levothyroxine  125 mcg Oral Q0600   loxapine  25 mg Oral TID   midodrine  2.5 mg Oral BID WC   mirabegron ER  50 mg Oral Daily   pantoprazole  40 mg Oral Daily   PARoxetine  40 mg Oral Daily   polyethylene glycol  34 g Oral Daily    topiramate  75 mg Oral BID   valbenazine  80 mg Oral QHS   Continuous Infusions:  ceFEPime (MAXIPIME) IV Stopped (04/09/23 0528)   lactated ringers 150 mL/hr (04/09/23 0147)   metronidazole     [START ON 04/10/2023] vancomycin       LOS: 1 day       Charise Killian, MD [facility] Pager [phone]  If 7PM-7AM, please contact  night-coverage [email or web address] 04/09/2023, 8:29 AM

## 2023-04-09 NOTE — ED Notes (Signed)
Pt provided peri care and brief changed. Pt had 1 urine occurrence. Pads and gown changed. Pt tolerated well. Pt adjusted in bed for comfort.

## 2023-04-09 NOTE — ED Notes (Signed)
Patient continually leaning of bed, pulling at cords patient moved to room visible by nursing station

## 2023-04-09 NOTE — ED Notes (Signed)
MD made aware of trending HR.

## 2023-04-10 DIAGNOSIS — G9340 Encephalopathy, unspecified: Secondary | ICD-10-CM | POA: Diagnosis not present

## 2023-04-10 LAB — CBC
HCT: 39.6 % (ref 39.0–52.0)
Hemoglobin: 13.5 g/dL (ref 13.0–17.0)
MCH: 32.1 pg (ref 26.0–34.0)
MCHC: 34.1 g/dL (ref 30.0–36.0)
MCV: 94.1 fL (ref 80.0–100.0)
Platelets: 257 10*3/uL (ref 150–400)
RBC: 4.21 MIL/uL — ABNORMAL LOW (ref 4.22–5.81)
RDW: 14.1 % (ref 11.5–15.5)
WBC: 5.3 10*3/uL (ref 4.0–10.5)
nRBC: 0 % (ref 0.0–0.2)

## 2023-04-10 LAB — BASIC METABOLIC PANEL
Anion gap: 9 (ref 5–15)
BUN: 11 mg/dL (ref 6–20)
CO2: 22 mmol/L (ref 22–32)
Calcium: 8.3 mg/dL — ABNORMAL LOW (ref 8.9–10.3)
Chloride: 106 mmol/L (ref 98–111)
Creatinine, Ser: 0.68 mg/dL (ref 0.61–1.24)
GFR, Estimated: >60 mL/min (ref >60)
Glucose, Bld: 71 mg/dL (ref 70–99)
Potassium: 3.6 mmol/L (ref 3.5–5.1)
Sodium: 137 mmol/L (ref 135–145)

## 2023-04-10 NOTE — Evaluation (Addendum)
Clinical/Bedside Swallow Evaluation Patient Details  Name: Jared Mayer MRN: [id number] Date of Birth: [birth year]  Today's Date: 04/10/2023 Time: SLP Start Time (ACUTE ONLY): 0910 SLP Stop Time (ACUTE ONLY): 1010 SLP Time Calculation (min) (ACUTE ONLY): 60 min  Past Medical History:  Past Medical History:  Diagnosis Date   Dementia (HCC)    Dyslipidemia    Hypothyroid    Manic behavior (HCC)    Mentally disabled    Schizoaffective disorder, bipolar type (HCC)    Past Surgical History: History reviewed. No pertinent surgical history. HPI:  Pt is a 52 y.o. Caucasian male with medical history significant for mental disability, Dementia, schizoaffective disorder, bipolar type, agressive/manic behavior, dyslipidemia, drug-induced parkinsonism, Falls, and hypothyroidism, who presented to the emergency room from his Group Home with acute onset of altered mental status with recent recurrent falls and unsteady gait with worsening disorientation and repetitive speech pattern as well as erratic behavior.  He has been more aggressive than usual at his group home per his mother who is a Engineer, civil (consulting) and was accompanying him initially.  He has been slamming his fist against walls and tables.  He was seen in the ED for psychiatric evaluation recently and did not have any significant medication changes per his mother.  He denied any chest pain or palpitation.  No cough or wheezing or dyspnea.  No dysuria, oliguria or hematuria or flank pain.  He was noted to have a fever of 38 C and denied chills.  No headache or dizziness or blurred vision.  No paresthesias or new focal muscle weakness.   CXR: Lungs are clear. No pneumothorax or pleural effusion.  Recent Head CT 03/2023: Unchanged asymmetric left cerebral hemispheric atrophy w/ no acute findings then.  Pt appears to require FULL assistance at meals d/t decreased awareness/attenttion w/ tasks.    OF NOTE: per chrat notes, "Per Caregiver, pt gets highly agitated and  yells aggressively when triggered. [name] explained that the pt. has been dx with Lewy body Dementia and feels that the pt. could benefit from a higher level of care.".     Assessment / Plan / Recommendation  Clinical Impression   Pt seen for BSE this morning during breakfast meal. Pt verbal(basic responses to simple questions re: self) w/ echolalia at times. Does Not consistently follow commands. Baseline Cognitive decline and mental disability per medical dxs/chart. Impulsive w/ decreased awareness of self.  On RA, afebrile. WBC WNL.   OF NOTE: MBSS 12/2018: pt "is presenting with moderate oropharyngeal dysphagia characterized by impulsive eating/drinking behaviors, disorganized oral management, delayed pharyngeal swallow initiation, trace pharyngeal residue, and laryngeal penetration of thin and thick liquids when taking large consecutive drinks. There is No observed tracheal aspiration, although he is at risk of trace aspiration of laryngeal vestibule residue. Given his impulsive eating/drinking behaviors due to his Cognitive status, the patient is at risk for choking and aspiration while eating/drinking. For optimal management, I recommend getting a home health speech therapy consult to assess in his usual home setting. In the meantime, I recommend that his diet be soft and moist (mechanical soft with gravies/sauces) and that he be discouraged the impulsive eating/drinking behaviors.". Known, Baseline dysphagia.   Pt appears to present w/ Chronic oropharyngeal phase dysphagia in setting of declined Cognitive and mental status'; Baseline Dementia(suspected Lewy Body Dementia per chart). This degree of Cognitive decline w/ poor attention/awareness to tasks can impact overall awareness/timing of chewing and swallowing and safety during po tasks which increases risk for choking, aspiration. Pt's  risk for aspiration/aspiration pneumonia is present but can be reduced when following general aspiration  precautions, using a modified diet consistency, and when given 100% SUPERVISION w/ oral intake to lessen IMPULSIVE eating/drinking behaviors.    Pt consumed ~90% of Breakfast meal w/ 100% Supervision by SLP of purees, soft/cup/chopped solids and thin liquids w/ inconsistent and immediate cough noted ~3x when pt was taking larger sips(straw not pinched by SLP) and when taking larger bites of foods -- IMPULSIVE behaviors. No overt clinical s/s of aspiration noted w/ trials when monitored for Small bites/sips Slowly -- clearing mouth b/t bites/sips also. No overt coughing, no decline in vocal quality, and no decline in respiratory status during/post trials. Oral phase is c/b Vertical Munching/chewing of foods, increased oral phase time for bolus management and oral clearing. Oral phase time appeared min disorganized in general. W/ Time given b/t boluses, pt cleared orally and was ready for the next bite/sip -- no Mixing of food and liquids. Pt attempted self-feeding but required MAX support and guidance for Small bites/sips d/t Cognitive decline. Was able to hold own Cup during drinking which improves safety of swallowing.  OM Exam was cursory but appeared [facility] w/ No unilateral weakness noted. Confusion of OM tasks and oral care+.     D/t pt's Baseline declined Cognitive status w/ Dementia and overall decreased awareness of self/tasks, recommend a more dysphagia level 3(mech soft) w/ Meats MINCED and gravies/condiments to moisten foods; thin liquids -- PINCHED STRAW. Aspiration precautions. 100% SUPERVISION at meals to monitor bolus size for SMALL bites/sips SLOWLY -- oral clearing b/t bites. Reduce Distractions during meals and engage pt during meals for self-feeding. Pills Crushed in Puree for safer swallowing. Support w/ feeding at meals as needed.   Discussed w/ MD/NSG that pt appears at/close to his Baseline per MBSS in 2020. ST services recommends follow w/ Palliative Care for GOC and education re: impact  of Cognitive decline/Dementia on swallowing. No further Acute ST services indicated; ST services can follow pt at discharge for further education w/ Staff as needed -- pt appears at his Baseline from a dysphagia standpoint. Largely suspect that pt's Dementia and overall functional status could continue to impact his swallowing/oral intake. Precautions posted in room; chart. Handouts left in room.  SLP Visit Diagnosis: Dysphagia, oropharyngeal phase (R13.12) (Chronic and Baseline in setting of Cognitive and mental status deficits)    Aspiration Risk  Mild aspiration risk;Risk for inadequate nutrition/hydration (reduced when Supervised w/ po intake; general aspiration precautions) -- appears at his Baseline presentation   Diet Recommendation   Thin;Dysphagia 3 (mechanical soft) (meats minced) = a more dysphagia level 3(mech soft) w/ Meats MINCED and gravies/condiments to moisten foods; thin liquids -- PINCHED STRAW. Aspiration precautions. 100% SUPERVISION at meals to monitor bolus size for SMALL bites/sips SLOWLY -- oral clearing b/t bites. Reduce Distractions during meals and engage pt during meals for self-feeding. Support w/ feeding at meals as needed.  Medication Administration: Crushed with puree    Other  Recommendations Recommended Consults:  (Dietician f/u; Palliative Care f/u for GOC) Oral Care Recommendations: Oral care BID;Oral care before and after PO;Staff/trained caregiver to provide oral care    Recommendations for follow up therapy are one component of a multi-disciplinary discharge planning process, led by the attending physician.  Recommendations may be updated based on patient status, additional functional criteria and insurance authorization.  Follow up Recommendations Follow physician's recommendations for discharge plan and follow up therapies (for education w/ Staff at his next venue of  care (as needed))      Assistance Recommended at Discharge  FULL Supervision at meals   Functional Status Assessment Patient has had a recent decline in their functional status and/or demonstrates limited ability to make significant improvements in function in a reasonable and predictable amount of time  Frequency and Duration  (n/a)   (n/a)       Prognosis Prognosis for improved oropharyngeal function: Fair Barriers to Reach Goals: Cognitive deficits;Language deficits;Severity of deficits;Time post onset;Behavior Barriers/Prognosis Comment: Mental Disability, Dementia, Psychiatric illness/behaviors; Chronic dysphagia/impulsivity w/ oral intake.      Swallow Study   General Date of Onset: 04/08/23 HPI: Pt is a 52 y.o. Caucasian male with medical history significant for mental disability, Dementia, schizoaffective disorder, bipolar type, agressive/manic behavior, dyslipidemia, drug-induced parkinsonism, Falls, and hypothyroidism, who presented to the emergency room from his Group Home with acute onset of altered mental status with recent recurrent falls and unsteady gait with worsening disorientation and repetitive speech pattern as well as erratic behavior.  He has been more aggressive than usual at his group home per his mother who is a Engineer, civil (consulting) and was accompanying him initially.  He has been slamming his fist against walls and tables.  He was seen in the ED for psychiatric evaluation recently and did not have any significant medication changes per his mother.  He denied any chest pain or palpitation.  No cough or wheezing or dyspnea.  No dysuria, oliguria or hematuria or flank pain.  He was noted to have a fever of 38 C and denied chills.  No headache or dizziness or blurred vision.  No paresthesias or new focal muscle weakness.  CXR: Lungs are clear. No pneumothorax or pleural effusion.  Recent Head CT 03/2023: Unchanged asymmetric left cerebral hemispheric atrophy w/ no acute findings then.  Pt appears to require FULL assistance at meals d/t decreased awareness/attenttion w/ tasks.    OF NOTE: per chrat notes, "Per Caregiver, pt gets highly agitated and yells aggressively when triggered. [name] explained that the pt. has been dx with Lewy body Dementia and feels that the pt. could benefit from a higher level of care.". Type of Study: Bedside Swallow Evaluation Previous Swallow Assessment: MBSS 12/2018: pt "is presenting with moderate oropharyngeal dysphagia characterized by impulsive eating/drinking behaviors, disorganized oral management, delayed pharyngeal swallow initiation, trace pharyngeal residue, and laryngeal penetration of thin and thick liquids when taking large consecutive drinks.  There is no observed tracheal aspiration, although he is at risk of trace aspiration of laryngeal vestibule residue.  Given his impulsive eating/drinking behaviors due to his cognitive status, the patient is at risk for choking and aspiration while eating/drinking.". Diet Prior to this Study: Dysphagia 3 (mechanical soft);Thin liquids (Level 0) (meats minced (per ST)) Temperature Spikes Noted: No (wbc 5.3) Respiratory Status: Room air History of Recent Intubation: No Behavior/Cognition: Alert;Cooperative;Pleasant mood;Confused;Distractible;Requires cueing;Doesn't follow directions;Impulsive Oral Cavity Assessment: Within Functional Limits Oral Care Completed by SLP: Yes (attempted) Oral Cavity - Dentition: Adequate natural dentition;Missing dentition (few) Vision: Functional for self-feeding Self-Feeding Abilities: Able to feed self;Total assist (Supervision d/t Impulsivity) Patient Positioning: Upright in bed (needed full support for upright sitting) Baseline Vocal Quality: Low vocal intensity (mumbled speech) Volitional Cough: Cognitively unable to elicit Volitional Swallow: Unable to elicit    Oral/Motor/Sensory Function Overall Oral Motor/Sensory Function: Within functional limits (open-mouth posture at rest; no unilateral weakness noted)   Ice Chips Ice chips: Not tested   Thin Liquid  Thin Liquid: Impaired (x1 w/ impulsive drinking (straw not  pinched)) Presentation: Straw;Self Fed (fully supported; ~12 ozs total) Oral Phase Impairments: Poor awareness of bolus Pharyngeal  Phase Impairments: Cough - Immediate (x1) Other Comments: water, milk, Ensure    Nectar Thick Nectar Thick Liquid: Not tested   Honey Thick Honey Thick Liquid: Not tested   Puree Puree: Impaired Presentation: Self Fed;Spoon (10+ trials) Oral Phase Impairments: Poor awareness of bolus Oral Phase Functional Implications: Prolonged oral transit (vertical munching on boluses) Pharyngeal Phase Impairments:  (none)   Solid     Solid: Impaired Presentation: Spoon;Self Fed (20+ trials) Oral Phase Impairments: Poor awareness of bolus;Impaired mastication Oral Phase Functional Implications: Prolonged oral transit;Impaired mastication (vertical munching on boluses) Pharyngeal Phase Impairments: Cough - Immediate (x2 during increased munching/chewing w/ larger boluses) Other Comments: suspect on residue/saliva from boluses        Jerilynn Som, MS, CCC-SLP Speech Language Pathologist Rehab Services; [facility] -  [phone] (ascom) Yoniel Arkwright 04/10/2023,12:57 PM

## 2023-04-10 NOTE — Progress Notes (Signed)
PROGRESS NOTE    Jared Mayer  NFA:[id number] DOB: 05/28/1970 DOA: 04/08/2023 PCP: [facility]  Assessment & Plan:   Principal Problem:   Altered mental status Active Problems:   Rhabdomyolysis   Schizoaffective disorder, bipolar type (HCC)   Dyslipidemia   Hypothyroidism   Anxiety and depression   Sepsis without acute organ dysfunction (HCC)   Multiple falls  Assessment and Plan: Encephalopathy: etiology unclear. Blood cxs NGTD. CXR was neg. Urine drug was positive for benzos. Alcohol < 10. CK was initially > 2,000. Psych evaluated pt and stated pt did not meet inpatient psych criteria. Sepsis r/o currently as no source found but spiked a fever so will continue on empiric abxs. No fever today so far. Continue on IV doxy, rocephin.   Rhabdomyolysis: CK elevated but trending down.   Schizoaffective disorder:  bipolar type. Continue on home dose of loxapine, lamictal, paroxetine. Does not meet inpatient psych criteria as per speech   Dysphagia: continue on dysphagia III diet as per speech   HLD: continue on statin    Hypothyroidism: continue on levothyroxine    Depression: severity unknown. Continue on home dose of paroxetine   Anxiety: severity unknown. Continue on home dose of klonopin    DVT prophylaxis: lovenox  Code Status: full  Family Communication:  Disposition Plan: unclear  Level of care: Telemetry Medical Consultants:    Procedures:   Antimicrobials:    Subjective: Pt is confused   Objective: Vitals:   04/09/23 2335 04/10/23 0428 04/10/23 0629 04/10/23 0809  BP: 133/87 126/80 120/75 108/72  Pulse: (!) 111 (!) 113 (!) 108 (!) 110  Resp: 20 20 20 18   Temp: 97.7 F (36.5 C) 98.3 F (36.8 C) 97.8 F (36.6 C) (!) 97.2 F (36.2 C)  TempSrc: Oral Oral Oral   SpO2: 96% 96% 94% 93%  Weight:        Intake/Output Summary (Last 24 hours) at 04/10/2023 1018 Last data filed at 04/10/2023 0300 Gross per 24 hour  Intake 298.32 ml   Output --  Net 298.32 ml   Filed Weights   04/08/23 2358  Weight: 77.4 kg    Examination:  General exam: Appears comfortable  Respiratory system: diminished breath sounds b/l   Cardiovascular system: S1/S2+. No rubs or clicks  Gastrointestinal system: abd is soft, NT, ND & hypoactive bowel sounds Central nervous system: alert & awake. Moves all extremities  Psychiatry: judgement and insight appears poor. Flat mood and affec    Data Reviewed: I have personally reviewed following labs and imaging studies  CBC: Recent Labs  Lab 04/08/23 1845 04/09/23 0450 04/10/23 0514  WBC 5.6 5.5 5.3  HGB 15.0 13.2 13.5  HCT 45.3 38.6* 39.6  MCV 97.6 94.6 94.1  PLT 302 258 257   Basic Metabolic Panel: Recent Labs  Lab 04/08/23 1845 04/08/23 1946 04/09/23 0450 04/10/23 0514  NA 138  --  139 137  K 3.5  --  3.3* 3.6  CL 105  --  106 106  CO2 22  --  24 22  GLUCOSE 166*  --  82 71  BUN 28*  --  22* 11  CREATININE 1.14  --  0.76 0.68  CALCIUM 8.8*  --  8.1* 8.3*  MG  --  2.2  --   --    GFR: Estimated Creatinine Clearance: 104.5 mL/min (by C-G formula based on SCr of 0.68 mg/dL). Liver Function Tests: Recent Labs  Lab 04/08/23 1845  AST 53*  ALT 25  ALKPHOS 93  BILITOT 0.7  PROT 6.7  ALBUMIN 3.5   No results for input(s): "LIPASE", "AMYLASE" in the last 168 hours. No results for input(s): "AMMONIA" in the last 168 hours. Coagulation Profile: Recent Labs  Lab 04/09/23 0450  INR 1.1   Cardiac Enzymes: Recent Labs  Lab 04/08/23 1946  CKTOTAL 767*   BNP (last 3 results) No results for input(s): "PROBNP" in the last 8760 hours. HbA1C: No results for input(s): "HGBA1C" in the last 72 hours. CBG: No results for input(s): "GLUCAP" in the last 168 hours. Lipid Profile: No results for input(s): "CHOL", "HDL", "LDLCALC", "TRIG", "CHOLHDL", "LDLDIRECT" in the last 72 hours. Thyroid Function Tests: No results for input(s): "TSH", "T4TOTAL", "FREET4", "T3FREE",  "THYROIDAB" in the last 72 hours. Anemia Panel: No results for input(s): "VITAMINB12", "FOLATE", "FERRITIN", "TIBC", "IRON", "RETICCTPCT" in the last 72 hours. Sepsis Labs: Recent Labs  Lab 04/08/23 1946 04/08/23 2114 04/09/23 0450  PROCALCITON <0.10  --  <0.10  LATICACIDVEN 1.5 1.2  --     Recent Results (from the past 240 hour(s))  Culture, blood (routine x 2)     Status: None (Preliminary result)   Collection Time: 04/08/23  7:46 PM   Specimen: BLOOD  Result Value Ref Range Status   Specimen Description BLOOD RIGHT ANTECUBITAL  Final   Special Requests   Final    BOTTLES DRAWN AEROBIC AND ANAEROBIC Blood Culture adequate volume   Culture   Final    NO GROWTH 2 DAYS Performed at [facility], [address], [place], [place] [address]    Report Status PENDING  Incomplete  Resp panel by RT-PCR (RSV, Flu A&B, Covid) Urine, Clean Catch     Status: None   Collection Time: 04/08/23  7:46 PM   Specimen: Urine, Clean Catch; Nasal Swab  Result Value Ref Range Status   SARS Coronavirus 2 by RT PCR NEGATIVE NEGATIVE Final    Comment: (NOTE) SARS-CoV-2 target nucleic acids are NOT DETECTED.  The SARS-CoV-2 RNA is generally detectable in upper respiratory specimens during the acute phase of infection. The lowest concentration of SARS-CoV-2 viral copies this assay can detect is 138 copies/mL. A negative result does not preclude SARS-Cov-2 infection and should not be used as the sole basis for treatment or other patient management decisions. A negative result may occur with  improper specimen collection/handling, submission of specimen other than nasopharyngeal swab, presence of viral mutation(s) within the areas targeted by this assay, and inadequate number of viral copies(<138 copies/mL). A negative result must be combined with clinical observations, patient history, and epidemiological information. The expected result is Negative.  Fact Sheet for Patients:   BloggerCourse.com  Fact Sheet for Healthcare Providers:  SeriousBroker.it  This test is no t yet approved or cleared by the [place] FDA and  has been authorized for detection and/or diagnosis of SARS-CoV-2 by FDA under an Emergency Use Authorization (EUA). This EUA will remain  in effect (meaning this test can be used) for the duration of the COVID-19 declaration under Section 564(b)(1) of the Act, 21 U.S.C.section 360bbb-3(b)(1), unless the authorization is terminated  or revoked sooner.       Influenza A by PCR NEGATIVE NEGATIVE Final   Influenza B by PCR NEGATIVE NEGATIVE Final    Comment: (NOTE) The Xpert Xpress SARS-CoV-2/FLU/RSV plus assay is intended as an aid in the diagnosis of influenza from Nasopharyngeal swab specimens and should not be used as a sole basis for treatment. Nasal washings and aspirates are  unacceptable for Xpert Xpress SARS-CoV-2/FLU/RSV testing.  Fact Sheet for Patients: BloggerCourse.com  Fact Sheet for Healthcare Providers: SeriousBroker.it  This test is not yet approved or cleared by the [place] FDA and has been authorized for detection and/or diagnosis of SARS-CoV-2 by FDA under an Emergency Use Authorization (EUA). This EUA will remain in effect (meaning this test can be used) for the duration of the COVID-19 declaration under Section 564(b)(1) of the Act, 21 U.S.C. section 360bbb-3(b)(1), unless the authorization is terminated or revoked.     Resp Syncytial Virus by PCR NEGATIVE NEGATIVE Final    Comment: (NOTE) Fact Sheet for Patients: BloggerCourse.com  Fact Sheet for Healthcare Providers: SeriousBroker.it  This test is not yet approved or cleared by the [place] FDA and has been authorized for detection and/or diagnosis of SARS-CoV-2 by FDA under an Emergency Use  Authorization (EUA). This EUA will remain in effect (meaning this test can be used) for the duration of the COVID-19 declaration under Section 564(b)(1) of the Act, 21 U.S.C. section 360bbb-3(b)(1), unless the authorization is terminated or revoked.  Performed at [facility], [address] [place], [place] [address]   Culture, blood (routine x 2)     Status: None (Preliminary result)   Collection Time: 04/08/23  8:11 PM   Specimen: BLOOD  Result Value Ref Range Status   Specimen Description BLOOD BLOOD LEFT ARM  Final   Special Requests   Final    BOTTLES DRAWN AEROBIC AND ANAEROBIC Blood Culture results may not be optimal due to an excessive volume of blood received in culture bottles   Culture   Final    NO GROWTH 2 DAYS Performed at [facility], [address] [place], [place] [address]    Report Status PENDING  Incomplete         Radiology Studies: DG Chest Portable 1 View  Result Date: 04/08/2023 CLINICAL DATA:  Fever EXAM: PORTABLE CHEST 1 VIEW COMPARISON:  03/10/2021 FINDINGS: Stable mild elevation of right hemidiaphragm with right-sided volume loss. Lungs are clear. No pneumothorax or pleural effusion. Cardiac size within normal limits. Pulmonary vascularity is normal. No acute bone abnormality. IMPRESSION: No active disease. Electronically Signed   By: Helyn Numbers M.D.   On: 04/08/2023 19:27        Scheduled Meds:  atorvastatin  40 mg Oral Daily   chlorhexidine  7.5 mL Mouth Rinse BID   divalproex  2,000 mg Oral QHS   docusate sodium  100 mg Oral BID   enoxaparin (LOVENOX) injection  40 mg Subcutaneous Q24H   glycopyrrolate  1 mg Oral BID   lactulose  20 g Oral BID   lamoTRIgine  100 mg Oral Daily   levothyroxine  125 mcg Oral Q0600   loxapine  25 mg Oral TID   midodrine  2.5 mg Oral BID WC   mirabegron ER  50 mg Oral Daily   pantoprazole  40 mg Oral Daily   PARoxetine  40 mg Oral Daily   polyethylene glycol  34 g Oral Daily    potassium chloride  40 mEq Oral Once   topiramate  75 mg Oral BID   valbenazine  80 mg Oral QHS   Continuous Infusions:  cefTRIAXone (ROCEPHIN)  IV 2 g (04/10/23 0210)   doxycycline (VIBRAMYCIN) IV 100 mg (04/10/23 0535)   metronidazole 500 mg (04/10/23 0911)     LOS: 2 days       Charise Killian, MD [facility] Pager [phone]  If 7PM-7AM, please contact night-coverage [email or web address] 04/10/2023, 10:18 AM

## 2023-04-10 NOTE — Plan of Care (Signed)

## 2023-04-11 DIAGNOSIS — G9340 Encephalopathy, unspecified: Secondary | ICD-10-CM | POA: Diagnosis not present

## 2023-04-11 LAB — BASIC METABOLIC PANEL
Anion gap: 8 (ref 5–15)
BUN: 11 mg/dL (ref 6–20)
CO2: 23 mmol/L (ref 22–32)
Calcium: 8.3 mg/dL — ABNORMAL LOW (ref 8.9–10.3)
Chloride: 107 mmol/L (ref 98–111)
Creatinine, Ser: 0.72 mg/dL (ref 0.61–1.24)
GFR, Estimated: >60 mL/min (ref >60)
Glucose, Bld: 91 mg/dL (ref 70–99)
Potassium: 3.7 mmol/L (ref 3.5–5.1)
Sodium: 138 mmol/L (ref 135–145)

## 2023-04-11 LAB — CBC
HCT: 42.3 % (ref 39.0–52.0)
Hemoglobin: 14.2 g/dL (ref 13.0–17.0)
MCH: 32.6 pg (ref 26.0–34.0)
MCHC: 33.6 g/dL (ref 30.0–36.0)
MCV: 97 fL (ref 80.0–100.0)
Platelets: 229 10*3/uL (ref 150–400)
RBC: 4.36 MIL/uL (ref 4.22–5.81)
RDW: 14.2 % (ref 11.5–15.5)
WBC: 4 10*3/uL (ref 4.0–10.5)
nRBC: 0 % (ref 0.0–0.2)

## 2023-04-11 LAB — CK: Total CK: 437 U/L — ABNORMAL HIGH (ref 49–397)

## 2023-04-11 MED ORDER — DOXYCYCLINE HYCLATE 100 MG PO TABS: 100.0000 mg | ORAL_TABLET | Freq: Two times a day (BID) | ORAL | Status: DC

## 2023-04-11 NOTE — Progress Notes (Signed)
PROGRESS NOTE    Jared Mayer  ZOX:[id number] DOB: [birth year] DOA: 04/08/2023 PCP: [facility]  Assessment & Plan:   Principal Problem:   Altered mental status Active Problems:   Rhabdomyolysis   Schizoaffective disorder, bipolar type (HCC)   Dyslipidemia   Hypothyroidism   Anxiety and depression   Sepsis without acute organ dysfunction (HCC)   Multiple falls  Assessment and Plan: Encephalopathy: etiology unclear but improving. Blood cxs NGTD. CXR was neg. Urine drug was positive for benzos. Alcohol < 10. CK was initially > 2,000. Psych evaluated pt and stated pt did not meet inpatient psych criteria. Sepsis r/o currently as no source found but spiked a fever day of admission so will continue on empiric abxs. No fever x 2 days so abxs d/c. Will monitor   Rhabdomyolysis: CK is still elevated but trending down   Schizoaffective disorder: bipolar type. Continue on home dose of loxapine, lamictal, paroxetine. Does not meet inpatient psych criteria as per speech   Dysphagia: continue on dysphagia III diet as per speech   HLD: continue on statin    Hypothyroidism: continue on levothyroxine    Depression: severity unknown. Continue on home dose of paroxetine   Anxiety: severity unknown. Continue on home dose of klonopin     DVT prophylaxis: lovenox  Code Status: full  Family Communication:  Disposition Plan: possibly d/c back to group home as pt does not meet inpatient psych criteria   Level of care: Telemetry Medical Consultants:    Procedures:   Antimicrobials:    Subjective: Pt c/o being hungry despite eating a full breakfast   Objective: Vitals:   04/10/23 1610 04/10/23 2010 04/10/23 2256 04/11/23 0421  BP: (!) 127/92 136/75 112/78 126/85  Pulse:  (!) 112 (!) 105 (!) 103  Resp: 18 18 18 16   Temp: 98.8 F (37.1 C) 97.6 F (36.4 C)  97.9 F (36.6 C)  TempSrc:    Oral  SpO2: 97% 98% 97% 99%  Weight:        Intake/Output Summary (Last 24  hours) at 04/11/2023 0829 Last data filed at 04/11/2023 0645 Gross per 24 hour  Intake 1845.81 ml  Output 1500 ml  Net 345.81 ml   Filed Weights   04/08/23 2358  Weight: 77.4 kg    Examination:  General exam: Appears calm & comfortable Respiratory system: decreased breath sounds b/l Cardiovascular system: S1 & S2+. No rubs or clicks  Gastrointestinal system: abd is soft, NT, ND, normal bowel sounds  Central nervous system: alert & awake. Moves all extremities  Psychiatry: judgement and insight appears poor. Flat mood and affect    Data Reviewed: I have personally reviewed following labs and imaging studies  CBC: Recent Labs  Lab 04/08/23 1845 04/09/23 0450 04/10/23 0514 04/11/23 0549  WBC 5.6 5.5 5.3 4.0  HGB 15.0 13.2 13.5 14.2  HCT 45.3 38.6* 39.6 42.3  MCV 97.6 94.6 94.1 97.0  PLT 302 258 257 229   Basic Metabolic Panel: Recent Labs  Lab 04/08/23 1845 04/08/23 1946 04/09/23 0450 04/10/23 0514 04/11/23 0549  NA 138  --  139 137 138  K 3.5  --  3.3* 3.6 3.7  CL 105  --  106 106 107  CO2 22  --  24 22 23   GLUCOSE 166*  --  82 71 91  BUN 28*  --  22* 11 11  CREATININE 1.14  --  0.76 0.68 0.72  CALCIUM 8.8*  --  8.1* 8.3* 8.3*  MG  --  2.2  --   --   --    GFR: Estimated Creatinine Clearance: 104.5 mL/min (by C-G formula based on SCr of 0.72 mg/dL). Liver Function Tests: Recent Labs  Lab 04/08/23 1845  AST 53*  ALT 25  ALKPHOS 93  BILITOT 0.7  PROT 6.7  ALBUMIN 3.5   No results for input(s): "LIPASE", "AMYLASE" in the last 168 hours. No results for input(s): "AMMONIA" in the last 168 hours. Coagulation Profile: Recent Labs  Lab 04/09/23 0450  INR 1.1   Cardiac Enzymes: Recent Labs  Lab 04/08/23 1946 04/11/23 0549  CKTOTAL 767* 437*   BNP (last 3 results) No results for input(s): "PROBNP" in the last 8760 hours. HbA1C: No results for input(s): "HGBA1C" in the last 72 hours. CBG: No results for input(s): "GLUCAP" in the last 168  hours. Lipid Profile: No results for input(s): "CHOL", "HDL", "LDLCALC", "TRIG", "CHOLHDL", "LDLDIRECT" in the last 72 hours. Thyroid Function Tests: No results for input(s): "TSH", "T4TOTAL", "FREET4", "T3FREE", "THYROIDAB" in the last 72 hours. Anemia Panel: No results for input(s): "VITAMINB12", "FOLATE", "FERRITIN", "TIBC", "IRON", "RETICCTPCT" in the last 72 hours. Sepsis Labs: Recent Labs  Lab 04/08/23 1946 04/08/23 2114 04/09/23 0450  PROCALCITON <0.10  --  <0.10  LATICACIDVEN 1.5 1.2  --     Recent Results (from the past 240 hour(s))  Culture, blood (routine x 2)     Status: None (Preliminary result)   Collection Time: 04/08/23  7:46 PM   Specimen: BLOOD  Result Value Ref Range Status   Specimen Description BLOOD RIGHT ANTECUBITAL  Final   Special Requests   Final    BOTTLES DRAWN AEROBIC AND ANAEROBIC Blood Culture adequate volume   Culture   Final    NO GROWTH 3 DAYS Performed at [facility], [address] [place], [place] [address]    Report Status PENDING  Incomplete  Resp panel by RT-PCR (RSV, Flu A&B, Covid) Urine, Clean Catch     Status: None   Collection Time: 04/08/23  7:46 PM   Specimen: Urine, Clean Catch; Nasal Swab  Result Value Ref Range Status   SARS Coronavirus 2 by RT PCR NEGATIVE NEGATIVE Final    Comment: (NOTE) SARS-CoV-2 target nucleic acids are NOT DETECTED.  The SARS-CoV-2 RNA is generally detectable in upper respiratory specimens during the acute phase of infection. The lowest concentration of SARS-CoV-2 viral copies this assay can detect is 138 copies/mL. A negative result does not preclude SARS-Cov-2 infection and should not be used as the sole basis for treatment or other patient management decisions. A negative result may occur with  improper specimen collection/handling, submission of specimen other than nasopharyngeal swab, presence of viral mutation(s) within the areas targeted by this assay, and inadequate number of  viral copies(<138 copies/mL). A negative result must be combined with clinical observations, patient history, and epidemiological information. The expected result is Negative.  Fact Sheet for Patients:  BloggerCourse.com  Fact Sheet for Healthcare Providers:  SeriousBroker.it  This test is no t yet approved or cleared by the [place] FDA and  has been authorized for detection and/or diagnosis of SARS-CoV-2 by FDA under an Emergency Use Authorization (EUA). This EUA will remain  in effect (meaning this test can be used) for the duration of the COVID-19 declaration under Section 564(b)(1) of the Act, 21 U.S.C.section 360bbb-3(b)(1), unless the authorization is terminated  or revoked sooner.       Influenza A by PCR NEGATIVE NEGATIVE Final   Influenza B  by PCR NEGATIVE NEGATIVE Final    Comment: (NOTE) The Xpert Xpress SARS-CoV-2/FLU/RSV plus assay is intended as an aid in the diagnosis of influenza from Nasopharyngeal swab specimens and should not be used as a sole basis for treatment. Nasal washings and aspirates are unacceptable for Xpert Xpress SARS-CoV-2/FLU/RSV testing.  Fact Sheet for Patients: BloggerCourse.com  Fact Sheet for Healthcare Providers: SeriousBroker.it  This test is not yet approved or cleared by the [place] FDA and has been authorized for detection and/or diagnosis of SARS-CoV-2 by FDA under an Emergency Use Authorization (EUA). This EUA will remain in effect (meaning this test can be used) for the duration of the COVID-19 declaration under Section 564(b)(1) of the Act, 21 U.S.C. section 360bbb-3(b)(1), unless the authorization is terminated or revoked.     Resp Syncytial Virus by PCR NEGATIVE NEGATIVE Final    Comment: (NOTE) Fact Sheet for Patients: BloggerCourse.com  Fact Sheet for Healthcare  Providers: SeriousBroker.it  This test is not yet approved or cleared by the [place] FDA and has been authorized for detection and/or diagnosis of SARS-CoV-2 by FDA under an Emergency Use Authorization (EUA). This EUA will remain in effect (meaning this test can be used) for the duration of the COVID-19 declaration under Section 564(b)(1) of the Act, 21 U.S.C. section 360bbb-3(b)(1), unless the authorization is terminated or revoked.  Performed at [facility], [address] [place], [place] [address]   Culture, blood (routine x 2)     Status: None (Preliminary result)   Collection Time: 04/08/23  8:11 PM   Specimen: BLOOD  Result Value Ref Range Status   Specimen Description BLOOD BLOOD LEFT ARM  Final   Special Requests   Final    BOTTLES DRAWN AEROBIC AND ANAEROBIC Blood Culture results may not be optimal due to an excessive volume of blood received in culture bottles   Culture   Final    NO GROWTH 3 DAYS Performed at [facility], [address], [place], [place] [address]    Report Status PENDING  Incomplete         Radiology Studies: No results found.      Scheduled Meds:  atorvastatin  40 mg Oral Daily   chlorhexidine  7.5 mL Mouth Rinse BID   divalproex  2,000 mg Oral QHS   docusate sodium  100 mg Oral BID   enoxaparin (LOVENOX) injection  40 mg Subcutaneous Q24H   glycopyrrolate  1 mg Oral BID   lactulose  20 g Oral BID   lamoTRIgine  100 mg Oral Daily   levothyroxine  125 mcg Oral Q0600   loxapine  25 mg Oral TID   midodrine  2.5 mg Oral BID WC   mirabegron ER  50 mg Oral Daily   pantoprazole  40 mg Oral Daily   PARoxetine  40 mg Oral Daily   polyethylene glycol  34 g Oral Daily   topiramate  75 mg Oral BID   valbenazine  80 mg Oral QHS   Continuous Infusions:     LOS: 3 days       Charise Killian, MD [facility] Pager [phone]  If 7PM-7AM, please contact  night-coverage [email or web address] 04/11/2023, 8:29 AM

## 2023-04-11 NOTE — Evaluation (Signed)
Occupational Therapy Evaluation Patient Details Name: Jared Mayer MRN: [id number] DOB: November 18, 1970 Today's Date: 04/11/2023   History of Present Illness Jared Mayer is a 52yoM who comes to [facility] on 04/08/23 from group home after progression in behavior disturbance- he has been more irritable, erratic behavior, repetitive speech, falls and stumbles. PMH dementia, bipolar type schizoaffective d/o, intellectual disability, drug-induced parkinsonism Pt lives in group home at baseline. Was here in ED from 11/23-11/28 for similar issue.   Clinical Impression   Patient presenting with decreased Ind in self care,balance, functional mobility/transfers,endurance, safety awareness, and cognition.  Patient has cognitive deficits at baseline and lives in group home. None present to confirm baseline. Pt is pleasant and cooperative overall. He does report his name, as therapist their name, and is able to report being in hospital. He does have moments of echolalia during session. Pt performing supine >sit with mod A for trunk support and LEs to EOB. Pt washing face with set up A and cuing to initiate. Pt stands with min +2 HHA and noted to have had BM. Pt stands for ~ 5 minutes for hygiene. He is unable to follow commands to take side steps and instead returns to sit on EOB and then attempts to put himself back into bed. Patient will benefit from acute OT to increase overall independence in the areas of ADLs, functional mobility, and safety awareness in order to safely discharge .Marland Kitchen       If plan is discharge home, recommend the following: A lot of help with walking and/or transfers;A lot of help with bathing/dressing/bathroom;Assistance with cooking/housework;Assist for transportation;Help with stairs or ramp for entrance;Direct supervision/assist for financial management;Direct supervision/assist for medications management    Functional Status Assessment  Patient has had a recent decline in their functional status  and demonstrates the ability to make significant improvements in function in a reasonable and predictable amount of time.  Equipment Recommendations  None recommended by OT       Precautions / Restrictions Precautions Precautions: Fall      Mobility Bed Mobility Overal bed mobility: Needs Assistance Bed Mobility: Supine to Sit, Sit to Supine     Supine to sit: Mod assist Sit to supine: Min assist   General bed mobility comments: mod A for trunk support and R LE to come to EOB and min A to return to supine    Transfers Overall transfer level: Needs assistance Equipment used: 2 person hand held assist Transfers: Sit to/from Stand Sit to Stand: Min assist, +2 physical assistance                  Balance Overall balance assessment: Needs assistance Sitting-balance support: Feet supported Sitting balance-Leahy Scale: Fair     Standing balance support: Bilateral upper extremity supported Standing balance-Leahy Scale: Poor                             ADL either performed or assessed with clinical judgement   ADL Overall ADL's : Needs assistance/impaired                                       General ADL Comments: Pt standing with +2 HHA and needing total A for hygiene as he had BM and was unaware. He stood for several minutes for hygiene to be completed but did not follow commands for side step to  get closer in bed.     Vision Patient Visual Report: No change from baseline              Pertinent Vitals/Pain Pain Assessment Pain Assessment: Faces Faces Pain Scale: No hurt     Extremity/Trunk Assessment Upper Extremity Assessment Upper Extremity Assessment: Generalized weakness   Lower Extremity Assessment Lower Extremity Assessment: Generalized weakness          Cognition Arousal: Alert Behavior During Therapy: Flat affect Overall Cognitive Status: History of cognitive impairments - at baseline                                                   Home Living Family/patient expects to be discharged to:: Group home Living Arrangements: Group Home                                      Prior Functioning/Environment Prior Level of Function : Patient poor historian/Family not available               ADLs Comments: Pt is poor historian and no staff from group home or family present during session. Pt reports ambulation at baseline with staff assist.        OT Problem List: Decreased strength;Decreased activity tolerance;Decreased safety awareness;Decreased cognition;Impaired balance (sitting and/or standing);Decreased knowledge of use of DME or AE      OT Treatment/Interventions: Self-care/ADL training;Therapeutic exercise;Therapeutic activities;Energy conservation;DME and/or AE instruction;Patient/family education;Balance training    OT Goals(Current goals can be found in the care plan section) Acute Rehab OT Goals Patient Stated Goal: to stand OT Goal Formulation: With patient Time For Goal Achievement: 04/25/23 Potential to Achieve Goals: Fair ADL Goals Pt Will Perform Grooming: with supervision;standing Pt Will Transfer to Toilet: with supervision;ambulating Pt Will Perform Toileting - Clothing Manipulation and hygiene: with supervision;sit to/from stand  OT Frequency: Min 1X/week       AM-PAC OT "6 Clicks" Daily Activity     Outcome Measure Help from another person eating meals?: A Lot Help from another person taking care of personal grooming?: A Lot Help from another person toileting, which includes using toliet, bedpan, or urinal?: Total Help from another person bathing (including washing, rinsing, drying)?: A Lot Help from another person to put on and taking off regular upper body clothing?: A Lot Help from another person to put on and taking off regular lower body clothing?: Total 6 Click Score: 10   End of Session Nurse Communication: Mobility  status  Activity Tolerance: Patient tolerated treatment well Patient left: in bed;with call bell/phone within reach;with bed alarm set  OT Visit Diagnosis: Unsteadiness on feet (R26.81);Muscle weakness (generalized) (M62.81)                Time: 1914-7829 OT Time Calculation (min): 14 min Charges:  OT General Charges $OT Visit: 1 Visit OT Evaluation $OT Eval Low Complexity: 1 Low OT Treatments $Self Care/Home Management : 8-22 mins  Jackquline Denmark, MS, OTR/L , CBIS ascom [phone]  04/11/23, 1:19 PM

## 2023-04-11 NOTE — Evaluation (Signed)
Physical Therapy Evaluation Patient Details Name: Jared Mayer MRN: [id number] DOB: 1971/05/08 Today's Date: 04/11/2023  History of Present Illness  Jared Mayer is a 52yoM who comes to [facility] on 04/08/23 from group home after progression in behavior disturbance- he has been more irritable, erratic behavior, repetitive speech, falls and stumbles. PMH dementia, bipolar type schizoaffective d/o, intellectual disability, drug-induced parkinsonism Pt lives in group home at baseline. Was here in ED from 11/23-11/28 for similar issue.  Clinical Impression  Pt asleep on entry, but arouses to voice, grows in alertness. Pt interactive, attends to author >50% of time. Pt's inability to follow commands consistently makes it difficult to partake in PT assessment. PT unable to follow any commands for movement of ankles/feet, legs to EOB. Follows simple commands with heavy multimodal cues for activity but complete redirection for cessation of grips, hand to head, etc. Unclear that pt will be appropriate for skilled PT services at this time due to cognitive limitations, but will keep with our services for a trial period to best determine potential.       If plan is discharge home, recommend the following: Two people to help with walking and/or transfers;Assist for transportation;Assistance with cooking/housework;Help with stairs or ramp for entrance;Assistance with feeding   Can travel by private vehicle   No    Equipment Recommendations None recommended by PT  Recommendations for Other Services       Functional Status Assessment Patient has had a recent decline in their functional status and/or demonstrates limited ability to make significant improvements in function in a reasonable and predictable amount of time     Precautions / Restrictions Precautions Precautions: Fall Restrictions Weight Bearing Restrictions: No      Mobility  Bed Mobility Overal bed mobility:  (unable to determine due to  cognitive deficits, inability to follow commands)                  Transfers                   General transfer comment: did not attempt due ot safety limitations    Ambulation/Gait                  Stairs            Wheelchair Mobility     Tilt Bed    Modified Rankin (Stroke Patients Only)       Balance                                             Pertinent Vitals/Pain Pain Assessment Pain Assessment: PAINAD Breathing: normal Negative Vocalization: none Facial Expression: smiling or inexpressive Body Language: relaxed Consolability: no need to console PAINAD Score: 0    Home Living Family/patient expects to be discharged to:: Group home Living Arrangements: Group Home                      Prior Function               Mobility Comments: Recent history needs clarification, previously (2019) was independent with mobility, but has had a slight decline per PT notes and ED notes over past 5 years.       Extremity/Trunk Assessment                Communication      Cognition  Arousal: Alert Behavior During Therapy: Flat affect Overall Cognitive Status: History of cognitive impairments - at baseline                                 General Comments: per 2019 note, not cognitively appropraite for call bell operation.        General Comments      Exercises     Assessment/Plan    PT Assessment Patient needs continued PT services  PT Problem List Decreased strength;Decreased mobility;Decreased cognition       PT Treatment Interventions Patient/family education;Therapeutic activities;Functional mobility training;Manual techniques    PT Goals (Current goals can be found in the Care Plan section)  Acute Rehab PT Goals PT Goal Formulation: Patient unable to participate in goal setting    Frequency Min 1X/week     Co-evaluation               AM-PAC PT "6 Clicks"  Mobility  Outcome Measure Help needed turning from your back to your side while in a flat bed without using bedrails?: Total Help needed moving from lying on your back to sitting on the side of a flat bed without using bedrails?: Total Help needed moving to and from a bed to a chair (including a wheelchair)?: Total Help needed standing up from a chair using your arms (e.g., wheelchair or bedside chair)?: Total Help needed to walk in hospital room?: Total Help needed climbing 3-5 steps with a railing? : Total 6 Click Score: 6    End of Session     Patient left: in bed Nurse Communication: Mobility status PT Visit Diagnosis: Other abnormalities of gait and mobility (R26.89);Muscle weakness (generalized) (M62.81);History of falling (Z91.81)    Time: 5784-6962 PT Time Calculation (min) (ACUTE ONLY): 11 min   Charges:   PT Evaluation $PT Eval Low Complexity: 1 Low   PT General Charges $$ ACUTE PT VISIT: 1 Visit        9:26 AM, 04/11/23 Rosamaria Lints, PT, DPT Physical Therapist - [facility]  [phone] (ASCOM)    Jared Mayer 04/11/2023, 9:21 AM

## 2023-04-11 NOTE — TOC CM/SW Note (Signed)
Attempted call to Legal Arlyn Dunning to confirm patient is from [facility] (as reflected per chart review) and plan to return at [place]. Left a VM requesting a return call from [place].  Alfonso Ramus, LCSW Transitions of Care Department [phone]

## 2023-04-11 NOTE — Plan of Care (Signed)
  Problem: Elimination: Goal: Will not experience complications related to bowel motility Outcome: Progressing Goal: Will not experience complications related to urinary retention Outcome: Progressing   Problem: Nutrition: Goal: Adequate nutrition will be maintained Outcome: Progressing   Problem: Safety: Goal: Ability to remain free from injury will improve Outcome: Progressing   Problem: Skin Integrity: Goal: Risk for impaired skin integrity will decrease Outcome: Progressing

## 2023-04-11 DEATH — deceased

## 2023-04-12 DIAGNOSIS — G9340 Encephalopathy, unspecified: Secondary | ICD-10-CM | POA: Diagnosis not present

## 2023-04-12 LAB — BASIC METABOLIC PANEL
Anion gap: 9 (ref 5–15)
BUN: 19 mg/dL (ref 6–20)
CO2: 25 mmol/L (ref 22–32)
Calcium: 8.9 mg/dL (ref 8.9–10.3)
Chloride: 106 mmol/L (ref 98–111)
Creatinine, Ser: 0.71 mg/dL (ref 0.61–1.24)
GFR, Estimated: >60 mL/min (ref >60)
Glucose, Bld: 96 mg/dL (ref 70–99)
Potassium: 3.9 mmol/L (ref 3.5–5.1)
Sodium: 140 mmol/L (ref 135–145)

## 2023-04-12 LAB — CBC
HCT: 43 % (ref 39.0–52.0)
Hemoglobin: 14.6 g/dL (ref 13.0–17.0)
MCH: 31.9 pg (ref 26.0–34.0)
MCHC: 34 g/dL (ref 30.0–36.0)
MCV: 94.1 fL (ref 80.0–100.0)
Platelets: 273 10*3/uL (ref 150–400)
RBC: 4.57 MIL/uL (ref 4.22–5.81)
RDW: 14.4 % (ref 11.5–15.5)
WBC: 5.9 10*3/uL (ref 4.0–10.5)
nRBC: 0 % (ref 0.0–0.2)

## 2023-04-12 NOTE — Progress Notes (Addendum)
Mobility Specialist - Progress Note   04/12/23 1431  Mobility  Activity Dangled on edge of bed;Stood at bedside;Ambulated with assistance in room  Level of Assistance Moderate assist, patient does 50-74%  Assistive Device None (HHA)  Distance Ambulated (ft) 4 ft  Activity Response Tolerated well  $Mobility charge 1 Mobility  Mobility Specialist Start Time (ACUTE ONLY) 1352  Mobility Specialist Stop Time (ACUTE ONLY) 1425  Mobility Specialist Time Calculation (min) (ACUTE ONLY) 33 min   Pt supine upon entry, utilizing RA. Pt completed bed mob MaxA-- unable to follow commands for movement of BLE towards EOB. Pt STS from EOB ModA +2 HHA. Pt took a few steps forward towards the sink ModA +2-- hips forward and resting on the sink with NBOS and L lateral lean noted while standing, unable to correct due to cognition. Multimodal cuing for BLE required during amb. Pt stood at the sink while brushing teeth MinA, cuing for sequencing steps and redirection to task. Pt took a few backwards steps, returned supine and left with alarm set and needs within reach.  Zetta Bills Mobility Specialist 04/12/23 2:53 PM

## 2023-04-12 NOTE — TOC Progression Note (Addendum)
Transition of Care [facility]) - Progression Note    Patient Details  Name: Jared Mayer MRN: [id number] Date of Birth: [birth year]  Transition of Care [facility]) CM/SW Contact  Margarito Liner, LCSW Phone Number: 04/12/2023, 9:54 AM  Clinical Narrative:   CSW called and spoke to Tammy from [facility]. She confirmed that patient is one of their ALF residents. He was not receiving home health prior to admission but his outpatient provider wanted him to get PT, OT, ST, RN. Their preferred agency is [facility]. Tammy stated that patient used a wheelchair prior to admission. Tammy stated that patient had aggressive behaviors at the facility which was new for him and led them to bring him to the hospital to be evaluated. Tammy will try to come assess patient today to determine if they can take him back or not.  11:43 am: ALF is reviewing therapy notes and feels like he may need rehab. CSW explained that he was not able to participate with therapy yesterday so insurance likely will not approve. Tammy will come see him this afternoon to confirm they cannot meet his needs.  2:09 pm: [facility] is unable to accept patient back and feels like he will need LTC SNF after rehab. CSW left his mother a voicemail.  3:02 pm: Received call back from patient's mother. CSW provided update.  Expected Discharge Plan: Assisted Living Barriers to Discharge: Continued Medical Work up  Expected Discharge Plan and Services       Living arrangements for the past 2 months: Assisted Living Facility                                       Social Determinants of Health (SDOH) Interventions SDOH Screenings   Food Insecurity: No Food Insecurity (04/12/2023)  Housing: Patient Unable To Answer (04/12/2023)  Transportation Needs: Patient Unable To Answer (04/12/2023)  Utilities: Patient Unable To Answer (04/12/2023)  Financial Resource Strain: Low Risk  (03/29/2018)  Physical Activity: Unknown (03/29/2018)  Social  Connections: Unknown (03/29/2018)  Stress: No Stress Concern Present (03/29/2018)  Tobacco Use: Low Risk  (04/08/2023)    Readmission Risk Interventions     No data to display

## 2023-04-12 NOTE — Plan of Care (Signed)

## 2023-04-12 NOTE — TOC Initial Note (Signed)
Transition of Care [facility]) - Initial/Assessment Note    Patient Details  Name: Jared Mayer MRN: [id number] Date of Birth: 17-Oct-1970  Transition of Care [facility]) CM/SW Contact:    Liliana Cline, LCSW Phone Number: 04/12/2023, 8:55 AM  Clinical Narrative:                 Call from Legal Guardian [name].  [name] confirmed patient is from [place] and can return if it is ok with [facility]. [name] states she left a message for Tammy with [facility], CSW informed [name] that [facility] will also reach out to Tammy to confirm. [name] states patient was recently diagnosed with Dementia and may need a higher level of care if [facility] cannot take him back. [name] states patient does not have any wandering or disruptive behaviors. Updated today's assigned TOC.  Expected Discharge Plan: Assisted Living Barriers to Discharge: Continued Medical Work up   Patient Goals and CMS Choice   CMS Medicare.gov Compare Post Acute Care list provided to:: Patient Represenative (must comment) Choice offered to / list presented to : [facility] POA / Guardian      Expected Discharge Plan and Services       Living arrangements for the past 2 months: Assisted Living Facility                                      Prior Living Arrangements/Services Living arrangements for the past 2 months: Assisted Living Facility Lives with:: Facility Resident Patient language and need for interpreter reviewed:: Yes Do you feel safe going back to the place where you live?: Yes      Need for Family Participation in Patient Care: Yes (Comment) Care giver support system in place?: Yes (comment)   Criminal Activity/Legal Involvement Pertinent to Current Situation/Hospitalization: No - Comment as needed  Activities of Daily Living      Permission Sought/Granted                  Emotional Assessment         Alcohol / Substance Use: Not Applicable Psych Involvement: No (comment)  Admission diagnosis:  Altered  mental status [R41.82] Multiple falls [R29.6] Altered mental status, unspecified altered mental status type [R41.82] Sepsis without acute organ dysfunction, due to unspecified organism [facility]) [A41.9] Patient Active Problem List   Diagnosis Date Noted   Dyslipidemia 04/08/2023   Hypothyroidism 04/08/2023   Anxiety and depression 04/08/2023   Rhabdomyolysis 04/08/2023   Sepsis without acute organ dysfunction (HCC) 04/08/2023   Multiple falls 04/08/2023   Aggressive behavior 04/03/2023   Dementia (HCC)    Accidental overdose 03/13/2021   Altered mental status 03/11/2021   Schizoaffective disorder, bipolar type (HCC) 03/11/2021   Accidental overdose of lamotrigine 03/11/2021   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 [place] [address] Phone: [phone] Fax: [phone]     Social Determinants of Health (SDOH) Social History: SDOH Screenings   Food Insecurity: No Food Insecurity (04/12/2023)  Housing: Patient Unable To Answer (04/12/2023)  Transportation Needs: Patient Unable To Answer (04/12/2023)  Utilities: Patient Unable  To Answer (04/12/2023)  Financial Resource Strain: Low Risk  (03/29/2018)  Physical Activity: Unknown (03/29/2018)  Social Connections: Unknown (03/29/2018)  Stress: No Stress Concern Present (03/29/2018)  Tobacco Use: Low Risk  (04/08/2023)   SDOH Interventions: Housing Interventions: Patient Unable to Answer Utilities Interventions: Patient Unable to Answer   Readmission Risk Interventions     No data to display

## 2023-04-12 NOTE — Progress Notes (Signed)
Pt confused and removed both IV's this shift even though wrapped.

## 2023-04-12 NOTE — Progress Notes (Signed)
PROGRESS NOTE    Jared Mayer  QMV:[id number] DOB: May 13, 1970 DOA: 04/08/2023 PCP: [facility]  Assessment & Plan:   Principal Problem:   Altered mental status Active Problems:   Rhabdomyolysis   Schizoaffective disorder, bipolar type (HCC)   Dyslipidemia   Hypothyroidism   Anxiety and depression   Sepsis without acute organ dysfunction (HCC)   Multiple falls  Assessment and Plan: Encephalopathy: etiology unclear but improving. Blood cxs NGTD. CXR was neg. Urine drug was positive for benzos. Alcohol < 10. CK was initially > 2,000. Psych evaluated pt and stated pt did not meet inpatient psych criteria. Sepsis r/o currently as no source found but spiked a fever day of admission. Abxs were d/c. Continue to monitor.   Rhabdomyolysis: CK is trending down   Schizoaffective disorder: bipolar type. Continue on home dose of loxapine, lamictal, paroxetine. Does not meet inpatient psych criteria as per speech   Dysphagia: continue on dysphagia III diet as per speech   HLD: continue on statin    Hypothyroidism: continue on levothyroxine    Depression: severity unknown. Continue on home dose of paroxetine   Anxiety: severity unknown. Continue on home dose of klonopin     DVT prophylaxis: lovenox  Code Status: full  Family Communication:  Disposition Plan: Pt's group home refuses to take the pt back.Unclear   Level of care: Telemetry Medical Consultants:    Procedures:   Antimicrobials:    Subjective: Pt c/o fatigue   Objective: Vitals:   04/11/23 1701 04/11/23 2246 04/12/23 0534 04/12/23 0837  BP: 108/68 119/75 114/67 114/75  Pulse: 60 (!) 110 (!) 102 96  Resp: 18 20 16 16   Temp: 98.3 F (36.8 C) 98.2 F (36.8 C) 98.3 F (36.8 C) 98.7 F (37.1 C)  TempSrc: Oral Oral Oral Axillary  SpO2: 95% 95% 96% 97%  Weight:        Intake/Output Summary (Last 24 hours) at 04/12/2023 1430 Last data filed at 04/11/2023 2230 Gross per 24 hour  Intake --   Output 800 ml  Net -800 ml   Filed Weights   04/08/23 2358  Weight: 77.4 kg    Examination:  General exam: Appears comfortable  Respiratory system: decreased breath sounds b/  Cardiovascular system: S1/S2+. No rubs or clicks  Gastrointestinal system: abd is soft, NT, ND & hypoactive bowel sounds Central nervous system: alert & awake. Moves all extremities  Psychiatry: judgement and insight appears poor     Data Reviewed: I have personally reviewed following labs and imaging studies  CBC: Recent Labs  Lab 04/08/23 1845 04/09/23 0450 04/10/23 0514 04/11/23 0549 04/12/23 0226  WBC 5.6 5.5 5.3 4.0 5.9  HGB 15.0 13.2 13.5 14.2 14.6  HCT 45.3 38.6* 39.6 42.3 43.0  MCV 97.6 94.6 94.1 97.0 94.1  PLT 302 258 257 229 273   Basic Metabolic Panel: Recent Labs  Lab 04/08/23 1845 04/08/23 1946 04/09/23 0450 04/10/23 0514 04/11/23 0549 04/12/23 0226  NA 138  --  139 137 138 140  K 3.5  --  3.3* 3.6 3.7 3.9  CL 105  --  106 106 107 106  CO2 22  --  24 22 23 25   GLUCOSE 166*  --  82 71 91 96  BUN 28*  --  22* 11 11 19   CREATININE 1.14  --  0.76 0.68 0.72 0.71  CALCIUM 8.8*  --  8.1* 8.3* 8.3* 8.9  MG  --  2.2  --   --   --   --  GFR: Estimated Creatinine Clearance: 104.5 mL/min (by C-G formula based on SCr of 0.71 mg/dL). Liver Function Tests: Recent Labs  Lab 04/08/23 1845  AST 53*  ALT 25  ALKPHOS 93  BILITOT 0.7  PROT 6.7  ALBUMIN 3.5   No results for input(s): "LIPASE", "AMYLASE" in the last 168 hours. No results for input(s): "AMMONIA" in the last 168 hours. Coagulation Profile: Recent Labs  Lab 04/09/23 0450  INR 1.1   Cardiac Enzymes: Recent Labs  Lab 04/08/23 1946 04/11/23 0549  CKTOTAL 767* 437*   BNP (last 3 results) No results for input(s): "PROBNP" in the last 8760 hours. HbA1C: No results for input(s): "HGBA1C" in the last 72 hours. CBG: No results for input(s): "GLUCAP" in the last 168 hours. Lipid Profile: No results for  input(s): "CHOL", "HDL", "LDLCALC", "TRIG", "CHOLHDL", "LDLDIRECT" in the last 72 hours. Thyroid Function Tests: No results for input(s): "TSH", "T4TOTAL", "FREET4", "T3FREE", "THYROIDAB" in the last 72 hours. Anemia Panel: No results for input(s): "VITAMINB12", "FOLATE", "FERRITIN", "TIBC", "IRON", "RETICCTPCT" in the last 72 hours. Sepsis Labs: Recent Labs  Lab 04/08/23 1946 04/08/23 2114 04/09/23 0450  PROCALCITON <0.10  --  <0.10  LATICACIDVEN 1.5 1.2  --     Recent Results (from the past 240 hour(s))  Culture, blood (routine x 2)     Status: None (Preliminary result)   Collection Time: 04/08/23  7:46 PM   Specimen: BLOOD  Result Value Ref Range Status   Specimen Description BLOOD RIGHT ANTECUBITAL  Final   Special Requests   Final    BOTTLES DRAWN AEROBIC AND ANAEROBIC Blood Culture adequate volume   Culture   Final    NO GROWTH 4 DAYS Performed at [facility], [address], [place], [place] [address]    Report Status PENDING  Incomplete  Resp panel by RT-PCR (RSV, Flu A&B, Covid) Urine, Clean Catch     Status: None   Collection Time: 04/08/23  7:46 PM   Specimen: Urine, Clean Catch; Nasal Swab  Result Value Ref Range Status   SARS Coronavirus 2 by RT PCR NEGATIVE NEGATIVE Final    Comment: (NOTE) SARS-CoV-2 target nucleic acids are NOT DETECTED.  The SARS-CoV-2 RNA is generally detectable in upper respiratory specimens during the acute phase of infection. The lowest concentration of SARS-CoV-2 viral copies this assay can detect is 138 copies/mL. A negative result does not preclude SARS-Cov-2 infection and should not be used as the sole basis for treatment or other patient management decisions. A negative result may occur with  improper specimen collection/handling, submission of specimen other than nasopharyngeal swab, presence of viral mutation(s) within the areas targeted by this assay, and inadequate number of viral copies(<138 copies/mL). A negative  result must be combined with clinical observations, patient history, and epidemiological information. The expected result is Negative.  Fact Sheet for Patients:  BloggerCourse.com  Fact Sheet for Healthcare Providers:  SeriousBroker.it  This test is no t yet approved or cleared by the [place] FDA and  has been authorized for detection and/or diagnosis of SARS-CoV-2 by FDA under an Emergency Use Authorization (EUA). This EUA will remain  in effect (meaning this test can be used) for the duration of the COVID-19 declaration under Section 564(b)(1) of the Act, 21 U.S.C.section 360bbb-3(b)(1), unless the authorization is terminated  or revoked sooner.       Influenza A by PCR NEGATIVE NEGATIVE Final   Influenza B by PCR NEGATIVE NEGATIVE Final    Comment: (NOTE) The Xpert  Xpress SARS-CoV-2/FLU/RSV plus assay is intended as an aid in the diagnosis of influenza from Nasopharyngeal swab specimens and should not be used as a sole basis for treatment. Nasal washings and aspirates are unacceptable for Xpert Xpress SARS-CoV-2/FLU/RSV testing.  Fact Sheet for Patients: BloggerCourse.com  Fact Sheet for Healthcare Providers: SeriousBroker.it  This test is not yet approved or cleared by the [place] FDA and has been authorized for detection and/or diagnosis of SARS-CoV-2 by FDA under an Emergency Use Authorization (EUA). This EUA will remain in effect (meaning this test can be used) for the duration of the COVID-19 declaration under Section 564(b)(1) of the Act, 21 U.S.C. section 360bbb-3(b)(1), unless the authorization is terminated or revoked.     Resp Syncytial Virus by PCR NEGATIVE NEGATIVE Final    Comment: (NOTE) Fact Sheet for Patients: BloggerCourse.com  Fact Sheet for Healthcare Providers: SeriousBroker.it  This  test is not yet approved or cleared by the [place] FDA and has been authorized for detection and/or diagnosis of SARS-CoV-2 by FDA under an Emergency Use Authorization (EUA). This EUA will remain in effect (meaning this test can be used) for the duration of the COVID-19 declaration under Section 564(b)(1) of the Act, 21 U.S.C. section 360bbb-3(b)(1), unless the authorization is terminated or revoked.  Performed at [facility], [address] [place], [place] [address]   Culture, blood (routine x 2)     Status: None (Preliminary result)   Collection Time: 04/08/23  8:11 PM   Specimen: BLOOD  Result Value Ref Range Status   Specimen Description BLOOD BLOOD LEFT ARM  Final   Special Requests   Final    BOTTLES DRAWN AEROBIC AND ANAEROBIC Blood Culture results may not be optimal due to an excessive volume of blood received in culture bottles   Culture   Final    NO GROWTH 4 DAYS Performed at [facility], [address], [place], [place] [address]    Report Status PENDING  Incomplete         Radiology Studies: No results found.      Scheduled Meds:  atorvastatin  40 mg Oral Daily   chlorhexidine  7.5 mL Mouth Rinse BID   divalproex  2,000 mg Oral QHS   docusate sodium  100 mg Oral BID   enoxaparin (LOVENOX) injection  40 mg Subcutaneous Q24H   glycopyrrolate  1 mg Oral BID   lactulose  20 g Oral BID   lamoTRIgine  100 mg Oral Daily   levothyroxine  125 mcg Oral Q0600   loxapine  25 mg Oral TID   midodrine  2.5 mg Oral BID WC   mirabegron ER  50 mg Oral Daily   pantoprazole  40 mg Oral Daily   PARoxetine  40 mg Oral Daily   polyethylene glycol  34 g Oral Daily   topiramate  75 mg Oral BID   valbenazine  80 mg Oral QHS   Continuous Infusions:     LOS: 4 days       Charise Killian, MD [facility] Pager [phone]  If 7PM-7AM, please contact night-coverage [email or web address] 04/12/2023, 2:30 PM

## 2023-04-13 DIAGNOSIS — G9340 Encephalopathy, unspecified: Secondary | ICD-10-CM | POA: Diagnosis not present

## 2023-04-13 LAB — BASIC METABOLIC PANEL
Anion gap: 8 (ref 5–15)
BUN: 17 mg/dL (ref 6–20)
CO2: 25 mmol/L (ref 22–32)
Calcium: 9.1 mg/dL (ref 8.9–10.3)
Chloride: 107 mmol/L (ref 98–111)
Creatinine, Ser: 0.69 mg/dL (ref 0.61–1.24)
GFR, Estimated: >60 mL/min (ref >60)
Glucose, Bld: 90 mg/dL (ref 70–99)
Potassium: 3.8 mmol/L (ref 3.5–5.1)
Sodium: 140 mmol/L (ref 135–145)

## 2023-04-13 LAB — CULTURE, BLOOD (ROUTINE X 2)
Culture: NO GROWTH
Culture: NO GROWTH
Special Requests: ADEQUATE

## 2023-04-13 LAB — CK: Total CK: 1113 U/L — ABNORMAL HIGH (ref 49–397)

## 2023-04-13 LAB — CBC
HCT: 44.1 % (ref 39.0–52.0)
Hemoglobin: 15.1 g/dL (ref 13.0–17.0)
MCH: 32.5 pg (ref 26.0–34.0)
MCHC: 34.2 g/dL (ref 30.0–36.0)
MCV: 94.8 fL (ref 80.0–100.0)
Platelets: 252 10*3/uL (ref 150–400)
RBC: 4.65 MIL/uL (ref 4.22–5.81)
RDW: 14.5 % (ref 11.5–15.5)
WBC: 5.7 10*3/uL (ref 4.0–10.5)
nRBC: 0 % (ref 0.0–0.2)

## 2023-04-13 LAB — LAMOTRIGINE LEVEL: Lamotrigine Lvl: 6.3 ug/mL (ref 2.0–20.0)

## 2023-04-13 NOTE — Progress Notes (Signed)
PROGRESS NOTE   HPI was taken from Dr. Arville Care: Jared Mayer is a 52 y.o. Caucasian male with medical history significant for schizoaffective disorder, bipolar type, dyslipidemia, drug-induced parkinsonism, and hypothyroidism, who presented to the emergency room with acute onset of altered mental status with recent recurrent falls and unsteady gait with worsening disorientation and repetitive speech pattern as well as erratic behavior.  He has been more aggressive than usual at his group home per his mother who is a Engineer, civil (consulting) and was accompanying him initially.  He has been slamming his fist against walls and tables.  He was seen in the ED for psychiatric evaluation recently and did not have any significant medication changes per his mother.  He denied any chest pain or palpitation.  No cough or wheezing or dyspnea.  No dysuria, oliguria or hematuria or flank pain.  He was noted to have a fever of 38 C and denied chills.  No headache or dizziness or blurred vision.  No paresthesias or new focal muscle weakness.   ED Course: Upon presentation to the emergency room, BP was 122/90 with heart rate 120 and respiratory rate of 18 and later 24 with temperature of 100.4/38.  Labs revealed a BUN of 28 with a glucose of 166 and calcium 8.8, AST 53 and otherwise unremarkable CMP.  Magnesium level was 2.2.  Total CK was elevated at 767 and lactic acid was 1.5 then 1.2.  Procalcitonin was less than 0.1.  CBC was within normal.  Valproic acid was 52. EKG as reviewed by me : EKG showed sinus tachycardia with rate 104 with suspected right atrial enlargement and right axis deviation with low voltage QRS. Imaging: Portable chest x-ray showed no acute cardiopulmonary disease.   The patient was given 1 L bolus of IV lactated Ringer as well as IV cefepime, vancomycin and Flagyl.  He will be admitted to a medical telemetry bed for further evaluation and management.   Jared Mayer  KZS:[id number] DOB: 08-08-1970 DOA:  04/08/2023 PCP: [facility]  Assessment & Plan:   Principal Problem:   Altered mental status Active Problems:   Rhabdomyolysis   Schizoaffective disorder, bipolar type (HCC)   Dyslipidemia   Hypothyroidism   Anxiety and depression   Sepsis without acute organ dysfunction (HCC)   Multiple falls  Assessment and Plan: Encephalopathy: etiology unclear but improving. Blood cxs NGTD. CXR was neg. Urine drug was positive for benzos. Alcohol < 10. CK was initially > 2,000. Psych evaluated pt and stated pt did not meet inpatient psych criteria. Sepsis r/o currently as no source found but spiked a fever day of admission. Abxs were d/c. Continue to monitor   Rhabdomyolysis: CK is trending up today. Holding statin now   Schizoaffective disorder: bipolar type. Continue on home dose of paroxetine, loxapine, lamictal. Does not meet inpatient psych criteria as per psych. Pt's group home will not take pt back   Dysphagia: continue on dysphagia III diet   HLD: holding statin b/c CK was trending up today   Hypothyroidism: continue on levothyroxine    Depression: severity unknown. Continue on home dose of paroxetine   Anxiety: severity unknown. Continue on home dose of klonopin     DVT prophylaxis: lovenox  Code Status: full  Family Communication:  Disposition Plan: Pt's group home refuses to take the pt back. Unclear   Level of care: Telemetry Medical Consultants:    Procedures:   Antimicrobials:    Subjective: Pt c/o tired   Objective: Vitals:  04/12/23 1743 04/12/23 1948 04/13/23 0416 04/13/23 0842  BP: 131/69 (!) 112/56 114/74 114/73  Pulse:   96 (!) 118  Resp:  17 19 18   Temp: 97.8 F (36.6 C) 97.8 F (36.6 C) 97.8 F (36.6 C) 98.2 F (36.8 C)  TempSrc: Oral Axillary Oral Oral  SpO2: 95% 95% 98% 97%  Weight:        Intake/Output Summary (Last 24 hours) at 04/13/2023 1534 Last data filed at 04/13/2023 1215 Gross per 24 hour  Intake --  Output 700  ml  Net -700 ml   Filed Weights   04/08/23 2358  Weight: 77.4 kg    Examination:  General exam: appears calm  Respiratory system: diminished breath sounds b/l  Cardiovascular system: S1 & S2+. No rubs or clicks  Gastrointestinal system: abd is soft, NT, ND & hypoactive bowel sounds Central nervous system: alert & awake. Moves all extremities  Psychiatry: judgement and insight appears poor     Data Reviewed: I have personally reviewed following labs and imaging studies  CBC: Recent Labs  Lab 04/09/23 0450 04/10/23 0514 04/11/23 0549 04/12/23 0226 04/13/23 0532  WBC 5.5 5.3 4.0 5.9 5.7  HGB 13.2 13.5 14.2 14.6 15.1  HCT 38.6* 39.6 42.3 43.0 44.1  MCV 94.6 94.1 97.0 94.1 94.8  PLT 258 257 229 273 252   Basic Metabolic Panel: Recent Labs  Lab 04/08/23 1946 04/09/23 0450 04/10/23 0514 04/11/23 0549 04/12/23 0226 04/13/23 0532  NA  --  139 137 138 140 140  K  --  3.3* 3.6 3.7 3.9 3.8  CL  --  106 106 107 106 107  CO2  --  24 22 23 25 25   GLUCOSE  --  82 71 91 96 90  BUN  --  22* 11 11 19 17   CREATININE  --  0.76 0.68 0.72 0.71 0.69  CALCIUM  --  8.1* 8.3* 8.3* 8.9 9.1  MG 2.2  --   --   --   --   --    GFR: Estimated Creatinine Clearance: 104.5 mL/min (by C-G formula based on SCr of 0.69 mg/dL). Liver Function Tests: Recent Labs  Lab 04/08/23 1845  AST 53*  ALT 25  ALKPHOS 93  BILITOT 0.7  PROT 6.7  ALBUMIN 3.5   No results for input(s): "LIPASE", "AMYLASE" in the last 168 hours. No results for input(s): "AMMONIA" in the last 168 hours. Coagulation Profile: Recent Labs  Lab 04/09/23 0450  INR 1.1   Cardiac Enzymes: Recent Labs  Lab 04/08/23 1946 04/11/23 0549 04/13/23 0532  CKTOTAL 767* 437* 1,113*   BNP (last 3 results) No results for input(s): "PROBNP" in the last 8760 hours. HbA1C: No results for input(s): "HGBA1C" in the last 72 hours. CBG: No results for input(s): "GLUCAP" in the last 168 hours. Lipid Profile: No results for  input(s): "CHOL", "HDL", "LDLCALC", "TRIG", "CHOLHDL", "LDLDIRECT" in the last 72 hours. Thyroid Function Tests: No results for input(s): "TSH", "T4TOTAL", "FREET4", "T3FREE", "THYROIDAB" in the last 72 hours. Anemia Panel: No results for input(s): "VITAMINB12", "FOLATE", "FERRITIN", "TIBC", "IRON", "RETICCTPCT" in the last 72 hours. Sepsis Labs: Recent Labs  Lab 04/08/23 1946 04/08/23 2114 04/09/23 0450  PROCALCITON <0.10  --  <0.10  LATICACIDVEN 1.5 1.2  --     Recent Results (from the past 240 hour(s))  Culture, blood (routine x 2)     Status: None   Collection Time: 04/08/23  7:46 PM   Specimen: BLOOD  Result Value Ref Range  Status   Specimen Description BLOOD RIGHT ANTECUBITAL  Final   Special Requests   Final    BOTTLES DRAWN AEROBIC AND ANAEROBIC Blood Culture adequate volume   Culture   Final    NO GROWTH 5 DAYS Performed at [facility], [address] [place], [place] [address]    Report Status 04/13/2023 FINAL  Final  Resp panel by RT-PCR (RSV, Flu A&B, Covid) Urine, Clean Catch     Status: None   Collection Time: 04/08/23  7:46 PM   Specimen: Urine, Clean Catch; Nasal Swab  Result Value Ref Range Status   SARS Coronavirus 2 by RT PCR NEGATIVE NEGATIVE Final    Comment: (NOTE) SARS-CoV-2 target nucleic acids are NOT DETECTED.  The SARS-CoV-2 RNA is generally detectable in upper respiratory specimens during the acute phase of infection. The lowest concentration of SARS-CoV-2 viral copies this assay can detect is 138 copies/mL. A negative result does not preclude SARS-Cov-2 infection and should not be used as the sole basis for treatment or other patient management decisions. A negative result may occur with  improper specimen collection/handling, submission of specimen other than nasopharyngeal swab, presence of viral mutation(s) within the areas targeted by this assay, and inadequate number of viral copies(<138 copies/mL). A negative result must be  combined with clinical observations, patient history, and epidemiological information. The expected result is Negative.  Fact Sheet for Patients:  BloggerCourse.com  Fact Sheet for Healthcare Providers:  SeriousBroker.it  This test is no t yet approved or cleared by the [place] FDA and  has been authorized for detection and/or diagnosis of SARS-CoV-2 by FDA under an Emergency Use Authorization (EUA). This EUA will remain  in effect (meaning this test can be used) for the duration of the COVID-19 declaration under Section 564(b)(1) of the Act, 21 U.S.C.section 360bbb-3(b)(1), unless the authorization is terminated  or revoked sooner.       Influenza A by PCR NEGATIVE NEGATIVE Final   Influenza B by PCR NEGATIVE NEGATIVE Final    Comment: (NOTE) The Xpert Xpress SARS-CoV-2/FLU/RSV plus assay is intended as an aid in the diagnosis of influenza from Nasopharyngeal swab specimens and should not be used as a sole basis for treatment. Nasal washings and aspirates are unacceptable for Xpert Xpress SARS-CoV-2/FLU/RSV testing.  Fact Sheet for Patients: BloggerCourse.com  Fact Sheet for Healthcare Providers: SeriousBroker.it  This test is not yet approved or cleared by the [place] FDA and has been authorized for detection and/or diagnosis of SARS-CoV-2 by FDA under an Emergency Use Authorization (EUA). This EUA will remain in effect (meaning this test can be used) for the duration of the COVID-19 declaration under Section 564(b)(1) of the Act, 21 U.S.C. section 360bbb-3(b)(1), unless the authorization is terminated or revoked.     Resp Syncytial Virus by PCR NEGATIVE NEGATIVE Final    Comment: (NOTE) Fact Sheet for Patients: BloggerCourse.com  Fact Sheet for Healthcare Providers: SeriousBroker.it  This test is not yet  approved or cleared by the [place] FDA and has been authorized for detection and/or diagnosis of SARS-CoV-2 by FDA under an Emergency Use Authorization (EUA). This EUA will remain in effect (meaning this test can be used) for the duration of the COVID-19 declaration under Section 564(b)(1) of the Act, 21 U.S.C. section 360bbb-3(b)(1), unless the authorization is terminated or revoked.  Performed at [facility], [address] [place], [place] [address]   Culture, blood (routine x 2)     Status: None   Collection  Time: 04/08/23  8:11 PM   Specimen: BLOOD  Result Value Ref Range Status   Specimen Description BLOOD BLOOD LEFT ARM  Final   Special Requests   Final    BOTTLES DRAWN AEROBIC AND ANAEROBIC Blood Culture results may not be optimal due to an excessive volume of blood received in culture bottles   Culture   Final    NO GROWTH 5 DAYS Performed at [facility], [address], [place], [place] [address]    Report Status 04/13/2023 FINAL  Final         Radiology Studies: No results found.      Scheduled Meds:  chlorhexidine  7.5 mL Mouth Rinse BID   divalproex  2,000 mg Oral QHS   docusate sodium  100 mg Oral BID   enoxaparin (LOVENOX) injection  40 mg Subcutaneous Q24H   glycopyrrolate  1 mg Oral BID   lactulose  20 g Oral BID   lamoTRIgine  100 mg Oral Daily   levothyroxine  125 mcg Oral Q0600   loxapine  25 mg Oral TID   midodrine  2.5 mg Oral BID WC   mirabegron ER  50 mg Oral Daily   pantoprazole  40 mg Oral Daily   PARoxetine  40 mg Oral Daily   polyethylene glycol  34 g Oral Daily   topiramate  75 mg Oral BID   valbenazine  80 mg Oral QHS   Continuous Infusions:     LOS: 5 days       Charise Killian, MD [facility] Pager [phone]  If 7PM-7AM, please contact night-coverage [email or web address] 04/13/2023, 3:34 PM

## 2023-04-13 NOTE — NC FL2 (Signed)
MEDICAID FL2 LEVEL OF CARE FORM     IDENTIFICATION  Patient Name: Jared Mayer Birthdate: [birth year] Sex: male Admission Date (Current Location): 04/08/2023  [place] and [place] Number:  Chiropodist and Address:  [facility], [address], [place], [place] [address]      Provider Number: [id number]  Attending Physician Name and Address:  Charise Killian, MD  Relative Name and Phone Number:       Current Level of Care: Hospital Recommended Level of Care: Skilled Nursing Facility Prior Approval Number:    Date Approved/Denied:   PASRR Number: Uncompleted screening done last admission. Will start new screening once closed out.  Discharge Plan: SNF    Current Diagnoses: Patient Active Problem List   Diagnosis Date Noted   Dyslipidemia 04/08/2023   Hypothyroidism 04/08/2023   Anxiety and depression 04/08/2023   Rhabdomyolysis 04/08/2023   Sepsis without acute organ dysfunction (HCC) 04/08/2023   Multiple falls 04/08/2023   Aggressive behavior 04/03/2023   Dementia (HCC)    Accidental overdose 03/13/2021   Altered mental status 03/11/2021   Schizoaffective disorder, bipolar type (HCC) 03/11/2021   Accidental overdose of lamotrigine 03/11/2021   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   Fall at home, initial encounter 03/11/2021   UTI (urinary tract infection) 03/29/2018    Orientation RESPIRATION BLADDER Height & Weight     Self  Normal Incontinent, External catheter Weight: 170 lb 10.2 oz (77.4 kg) Height:     BEHAVIORAL SYMPTOMS/MOOD NEUROLOGICAL BOWEL NUTRITION STATUS   (Restless at times)   Incontinent Diet (DYS 3. MINCED meats, gravies. Cream soups only!)  AMBULATORY STATUS COMMUNICATION OF NEEDS Skin   Extensive Assist Verbally Skin abrasions, Other (Comment) (Erythema/redness.)                       Personal Care Assistance Level of Assistance  Bathing, Feeding, Dressing Bathing Assistance: Maximum  assistance Feeding assistance: Limited assistance Dressing Assistance: Maximum assistance     Functional Limitations Info  Sight, Hearing, Speech Sight Info: Adequate Hearing Info: Adequate Speech Info: Adequate    SPECIAL CARE FACTORS FREQUENCY                       Contractures Contractures Info: Not present    Additional Factors Info  Code Status, Allergies Code Status Info: Full code Allergies Info: NKDA           Current Medications (04/13/2023):  This is the current hospital active medication list Current Facility-Administered Medications  Medication Dose Route Frequency Provider Last Rate Last Admin   acetaminophen (TYLENOL) tablet 650 mg  650 mg Oral Q6H PRN Mansy, Jan A, MD   650 mg at 04/09/23 0502   Or   acetaminophen (TYLENOL) suppository 650 mg  650 mg Rectal Q6H PRN Mansy, Jan A, MD       chlorhexidine (PERIDEX) 0.12 % solution 7.5 mL  7.5 mL Mouth Rinse BID Mansy, Jan A, MD   7.5 mL at 04/13/23 0844   clonazePAM (KLONOPIN) tablet 1 mg  1 mg Oral BID PRN Mansy, Jan A, MD   1 mg at 04/11/23 1751   diclofenac Sodium (VOLTAREN) 1 % topical gel 1 Application  1 Application Topical BID PRN Mansy, Jan A, MD       divalproex (DEPAKOTE ER) 24 hr tablet 2,000 mg  2,000 mg Oral QHS Mansy, Jan A, MD   2,000 mg at 04/12/23 2157   docusate  sodium (COLACE) capsule 100 mg  100 mg Oral BID Mansy, Jan A, MD   100 mg at 04/12/23 2156   enoxaparin (LOVENOX) injection 40 mg  40 mg Subcutaneous Q24H Mansy, Jan A, MD   40 mg at 04/13/23 0845   glycopyrrolate (ROBINUL) tablet 1 mg  1 mg Oral BID Mansy, Jan A, MD   1 mg at 04/13/23 0853   lactulose (CHRONULAC) 10 GM/15ML solution 20 g  20 g Oral BID Mansy, Jan A, MD   20 g at 04/13/23 0846   lamoTRIgine (LAMICTAL) tablet 100 mg  100 mg Oral Daily Mansy, Jan A, MD   100 mg at 04/13/23 0904   levothyroxine (SYNTHROID) tablet 125 mcg  125 mcg Oral Q0600 Mansy, Jan A, MD   125 mcg at 04/13/23 0655   loratadine (CLARITIN) tablet 10  mg  10 mg Oral Daily PRN Mansy, Jan A, MD       loxapine (LOXITANE) capsule 25 mg  25 mg Oral TID Mansy, Jan A, MD   25 mg at 04/13/23 0855   magnesium hydroxide (MILK OF MAGNESIA) suspension 30 mL  30 mL Oral Daily PRN Mansy, Jan A, MD       metoprolol tartrate (LOPRESSOR) injection 5 mg  5 mg Intravenous Q8H PRN Charise Killian, MD       midodrine (PROAMATINE) tablet 2.5 mg  2.5 mg Oral BID WC Mansy, Jan A, MD   2.5 mg at 04/13/23 0902   mirabegron ER (MYRBETRIQ) tablet 50 mg  50 mg Oral Daily Mansy, Jan A, MD   50 mg at 04/13/23 0852   nitroGLYCERIN (NITROSTAT) SL tablet 0.4 mg  0.4 mg Sublingual Q5 min PRN Mansy, Jan A, MD       ondansetron [facility]) tablet 4 mg  4 mg Oral Q6H PRN Mansy, Jan A, MD       Or   ondansetron [facility]) injection 4 mg  4 mg Intravenous Q6H PRN Mansy, Jan A, MD       pantoprazole (PROTONIX) EC tablet 40 mg  40 mg Oral Daily Mansy, Jan A, MD   40 mg at 04/13/23 0900   PARoxetine (PAXIL) tablet 40 mg  40 mg Oral Daily Mansy, Jan A, MD   40 mg at 04/13/23 0848   polyethylene glycol (MIRALAX / GLYCOLAX) packet 34 g  34 g Oral Daily Otelia Sergeant, RPH   34 g at 04/13/23 0904   simethicone (MYLICON) chewable tablet 80 mg  80 mg Oral Q6H PRN Mansy, Jan A, MD       topiramate (TOPAMAX) tablet 75 mg  75 mg Oral BID Mansy, Jan A, MD   75 mg at 04/13/23 0850   traZODone (DESYREL) tablet 25 mg  25 mg Oral QHS PRN Mansy, Jan A, MD   25 mg at 04/11/23 2235   valbenazine [facility]) capsule 80 mg  80 mg Oral QHS Mansy, Jan A, MD   80 mg at 04/12/23 2156     Discharge Medications: Please see discharge summary for a list of discharge medications.  Relevant Imaging Results:  Relevant Lab Results:   Additional Information SS#: [id number]. Needs LTC placement. Was living at [facility] but they cannot take him back.  Margarito Liner, LCSW

## 2023-04-13 NOTE — Progress Notes (Signed)
Physical Therapy Treatment Patient Details Name: Jared Mayer MRN: [id number] DOB: [birth year] Today's Date: 04/13/2023   History of Present Illness Jared Mayer is a 52yoM who comes to [facility] on 04/08/23 from group home after progression in behavior disturbance- he has been more irritable, erratic behavior, repetitive speech, falls and stumbles. PMH dementia, bipolar type schizoaffective d/o, intellectual disability, drug-induced parkinsonism Pt lives in group home at baseline. Was here in ED from 11/23-11/28 for similar issue.    PT Comments  Pt alert but minimally verbal with PT, did whisper a few words, "thank you" and "yes I want blankets" when asked. Pt required min-modA for bed mobility but when he saw the [facility] he was able to grab for the arm rest. With visual cues, hand placement assist, and extra time for processing he was able to stand pivot with CGA, and then returned to bed minA (mostly for initiation of movement, second assist present for safety but not necessary). Pt most successful with transferring to his R. Returned to supine with needs in reach.     If plan is discharge home, recommend the following: Assist for transportation;Assistance with cooking/housework;Help with stairs or ramp for entrance;Assistance with feeding;A little help with walking and/or transfers;A lot of help with bathing/dressing/bathroom   Can travel by private vehicle     No  Equipment Recommendations  None recommended by PT    Recommendations for Other Services       Precautions / Restrictions Precautions Precautions: Fall Restrictions Weight Bearing Restrictions: No     Mobility  Bed Mobility Overal bed mobility: Needs Assistance Bed Mobility: Supine to Sit, Sit to Supine     Supine to sit: Min assist, HOB elevated Sit to supine: Mod assist        Transfers Overall transfer level: Needs assistance Equipment used: 1 person hand held assist Transfers: Bed to chair/wheelchair/BSC   Stand  pivot transfers: Contact guard assist, Min assist         General transfer comment: CGA to recliner, minA to return to EOB. WC on R, bed on R side when returning    Ambulation/Gait               General Gait Details: deferred   Stairs             Wheelchair Mobility     Tilt Bed    Modified Rankin (Stroke Patients Only)       Balance Overall balance assessment: Needs assistance Sitting-balance support: Feet supported Sitting balance-Leahy Scale: Fair     Standing balance support: Bilateral upper extremity supported Standing balance-Leahy Scale: Poor                              Cognition Arousal: Alert Behavior During Therapy: Flat affect Overall Cognitive Status: History of cognitive impairments - at baseline                                          Exercises      General Comments        Pertinent Vitals/Pain Pain Assessment Pain Assessment: Faces Faces Pain Scale: No hurt    Home Living                          Prior Function  PT Goals (current goals can now be found in the care plan section) Progress towards PT goals: Progressing toward goals    Frequency    Min 1X/week      PT Plan      Co-evaluation              AM-PAC PT "6 Clicks" Mobility   Outcome Measure  Help needed turning from your back to your side while in a flat bed without using bedrails?: A Lot Help needed moving from lying on your back to sitting on the side of a flat bed without using bedrails?: A Lot Help needed moving to and from a bed to a chair (including a wheelchair)?: A Little Help needed standing up from a chair using your arms (e.g., wheelchair or bedside chair)?: A Little Help needed to walk in hospital room?: Total Help needed climbing 3-5 steps with a railing? : Total 6 Click Score: 12    End of Session   Activity Tolerance: Patient tolerated treatment well Patient left: in  bed;with call bell/phone within reach;with bed alarm set Nurse Communication: Mobility status PT Visit Diagnosis: Other abnormalities of gait and mobility (R26.89);Muscle weakness (generalized) (M62.81);History of falling (Z91.81)     Time: 1330-1350 PT Time Calculation (min) (ACUTE ONLY): 20 min  Charges:    $Therapeutic Activity: 8-22 mins PT General Charges $$ ACUTE PT VISIT: 1 Visit                     Olga Coaster PT, DPT 2:11 PM,04/13/23

## 2023-04-13 NOTE — Plan of Care (Signed)
  Problem: Respiratory: Goal: Ability to maintain adequate ventilation will improve Outcome: Progressing   Problem: Clinical Measurements: Goal: Ability to maintain clinical measurements within normal limits will improve Outcome: Progressing   Problem: Clinical Measurements: Goal: Will remain free from infection Outcome: Progressing   Problem: Clinical Measurements: Goal: Respiratory complications will improve Outcome: Progressing

## 2023-04-14 DIAGNOSIS — R410 Disorientation, unspecified: Secondary | ICD-10-CM | POA: Diagnosis not present

## 2023-04-14 LAB — BASIC METABOLIC PANEL
Anion gap: 7 (ref 5–15)
BUN: 23 mg/dL — ABNORMAL HIGH (ref 6–20)
CO2: 29 mmol/L (ref 22–32)
Calcium: 8.8 mg/dL — ABNORMAL LOW (ref 8.9–10.3)
Chloride: 105 mmol/L (ref 98–111)
Creatinine, Ser: 0.87 mg/dL (ref 0.61–1.24)
GFR, Estimated: >60 mL/min (ref >60)
Glucose, Bld: 89 mg/dL (ref 70–99)
Potassium: 3.5 mmol/L (ref 3.5–5.1)
Sodium: 141 mmol/L (ref 135–145)

## 2023-04-14 LAB — TSH: TSH: 2.575 u[IU]/mL (ref 0.350–4.500)

## 2023-04-14 LAB — GLUCOSE, CAPILLARY
Glucose-Capillary: 92 mg/dL (ref 70–99)
Glucose-Capillary: 109 mg/dL — ABNORMAL HIGH (ref 70–99)

## 2023-04-14 LAB — CBC
HCT: 42.7 % (ref 39.0–52.0)
Hemoglobin: 14.2 g/dL (ref 13.0–17.0)
MCH: 32.1 pg (ref 26.0–34.0)
MCHC: 33.3 g/dL (ref 30.0–36.0)
MCV: 96.6 fL (ref 80.0–100.0)
Platelets: 269 10*3/uL (ref 150–400)
RBC: 4.42 MIL/uL (ref 4.22–5.81)
RDW: 14.6 % (ref 11.5–15.5)
WBC: 4.7 10*3/uL (ref 4.0–10.5)
nRBC: 0 % (ref 0.0–0.2)

## 2023-04-14 LAB — CK: Total CK: 1055 U/L — ABNORMAL HIGH (ref 49–397)

## 2023-04-14 MED ORDER — VITAMIN D (ERGOCALCIFEROL) 1.25 MG (50000 UNIT) PO CAPS
50000.0000 [IU] | ORAL_CAPSULE | ORAL | Status: DC
  Administered 2023-04-15 – 2023-05-06 (×2): 50000 [IU] via ORAL
  Filled 2023-04-14 (×4): qty 1

## 2023-04-14 NOTE — Progress Notes (Signed)
Occupational Therapy Treatment Patient Details Name: Jared Mayer MRN: [id number] DOB: 1971/02/27 Today's Date: 04/14/2023   History of present illness Jared Mayer is a 52yoM who comes to [facility] on 04/08/23 from group home after progression in behavior disturbance- he has been more irritable, erratic behavior, repetitive speech, falls and stumbles. PMH dementia, bipolar type schizoaffective d/o, intellectual disability, drug-induced parkinsonism Pt lives in group home at baseline. Was here in ED from 11/23-11/28 for similar issue.   OT comments  Chart reviewed to date, pt greeted in bed, responsive to name but generally echolalic throughout. +2 required for bed mobility and MAX A for STS and SPT to/from bsc with step by step multi modal cues. Pt requires TOTAL A for peri care, unable to maintain standing on this date. Of note, HR in 120s at rest, 130s with mobility. BP 99/68 (MAP 79) spo2 96% on RA after mobility. Nurse notified of heart rate. Pt performance appears to fluctuate, will continue to assess. OT will continue to follow acutely.       If plan is discharge home, recommend the following:  A lot of help with walking and/or transfers;A lot of help with bathing/dressing/bathroom;Assistance with cooking/housework;Assist for transportation;Help with stairs or ramp for entrance;Direct supervision/assist for financial management;Direct supervision/assist for medications management   Equipment Recommendations  None recommended by OT    Recommendations for Other Services      Precautions / Restrictions Precautions Precautions: Fall Restrictions Weight Bearing Restrictions: No       Mobility Bed Mobility Overal bed mobility: Needs Assistance Bed Mobility: Supine to Sit, Sit to Supine     Supine to sit: Max assist, HOB elevated Sit to supine: Max assist, HOB elevated, +2 for physical assistance        Transfers Overall transfer level: Needs assistance Equipment used: 2 person  hand held assist                     Balance Overall balance assessment: Needs assistance Sitting-balance support: Feet supported Sitting balance-Leahy Scale: Fair     Standing balance support: Bilateral upper extremity supported Standing balance-Leahy Scale: Poor                             ADL either performed or assessed with clinical judgement   ADL Overall ADL's : Needs assistance/impaired     Grooming: Wash/dry face;Maximal assistance       Lower Body Bathing: Total assistance;+2 for physical assistance;+2 for safety/equipment   Upper Body Dressing : Minimal assistance       Toilet Transfer: Maximal assistance;+2 for physical assistance;+2 for safety/equipment   Toileting- Clothing Manipulation and Hygiene: Maximal assistance;Total assistance;Sit to/from stand Toileting - Clothing Manipulation Details (indicate cue type and reason): incontient BM       General ADL Comments: pt unable to tolerate STS for peri care on this date    Extremity/Trunk Assessment              Vision       Perception     Praxis      Cognition Arousal: Alert Behavior During Therapy: Flat affect Overall Cognitive Status: No family/caregiver present to determine baseline cognitive functioning Area of Impairment: Orientation, Attention, Memory, Following commands, Safety/judgement, Awareness, Problem solving                 Orientation Level: Disoriented to, Place, Time, Situation Current Attention Level: Focused Memory: Decreased short-term memory, Decreased recall  of precautions Following Commands: Follows one step commands inconsistently Safety/Judgement: Decreased awareness of safety, Decreased awareness of deficits Awareness: Intellectual Problem Solving: Slow processing, Decreased initiation, Difficulty sequencing, Requires verbal cues, Requires tactile cues General Comments: pt is echolalic        Exercises      Shoulder Instructions        General Comments      Pertinent Vitals/ Pain       Pain Assessment Pain Assessment: Faces Faces Pain Scale: Hurts a little bit Pain Location: generalized Pain Intervention(s): Monitored during session  Home Living                                          Prior Functioning/Environment              Frequency  Min 1X/week        Progress Toward Goals  OT Goals(current goals can now be found in the care plan section)  Progress towards OT goals: Progressing toward goals  Acute Rehab OT Goals Time For Goal Achievement: 04/25/23  Plan      Co-evaluation                 AM-PAC OT "6 Clicks" Daily Activity     Outcome Measure   Help from another person eating meals?: A Lot Help from another person taking care of personal grooming?: A Lot Help from another person toileting, which includes using toliet, bedpan, or urinal?: Total Help from another person bathing (including washing, rinsing, drying)?: A Lot Help from another person to put on and taking off regular upper body clothing?: A Lot Help from another person to put on and taking off regular lower body clothing?: Total 6 Click Score: 10    End of Session    OT Visit Diagnosis: Unsteadiness on feet (R26.81);Muscle weakness (generalized) (M62.81)   Activity Tolerance Patient tolerated treatment well   Patient Left in bed;with call bell/phone within reach;with bed alarm set   Nurse Communication Mobility status (increased HR)        Time: 4098-1191 OT Time Calculation (min): 25 min  Charges: OT General Charges $OT Visit: 1 Visit OT Treatments $Self Care/Home Management : 23-37 mins  Oleta Mouse, OTD OTR/L  04/14/23, 4:11 PM

## 2023-04-14 NOTE — Progress Notes (Addendum)
Progress Note    Jared Mayer  ZOX:[id number] DOB: [birth year]  DOA: 04/08/2023 PCP: Almetta Lovely, [facility]      Brief Narrative:    Medical records reviewed and are as summarized below:  Jared Mayer is a 52 y.o. male with medical history significant for intellectual disability, schizoaffective disorder, Lewy body dementia, dyslipidemia, drug-induced parkinsonism, hypothyroidism, who presented today for hospital because of altered mental status, disorientation, repetitive speech pattern, erratic behavior and recurrent falls.  Reportedly, he had been more aggressive at the group home than usual.      Assessment/Plan:   Principal Problem:   Altered mental status Active Problems:   Rhabdomyolysis   Schizoaffective disorder, bipolar type (HCC)   Dyslipidemia   Hypothyroidism   Anxiety and depression   Sepsis without acute organ dysfunction (HCC)   Multiple falls    Body mass index is 25.95 kg/m.   Acute metabolic encephalopathy on underlying Lewy body dementia:   Rhabdomyolysis: CK down from 1,113  to 1,055.  Statin on hold.  Monitor CK levels.  Check TSH. Chart review showed that CK was 4,622 on 03/11/2021.   Dysphagia: Continue dysphagia 3 diet   Vitamin D deficiency: Vitamin D level on 04/07/2023 from an outside lab was 11.1.  Start high-dose vitamin D supplement 50,000 units weekly.   Schizoaffective disorder, erratic behavior, depression, anxiety: Continue psychotropics.  Patient does not meet criteria for admission to inpatient behavioral health unit.  Unfortunately, group home will not take him back.  Follow-up with social worker to assist with disposition.   Multiple falls: Plan to discharge to long-term care facility.    Diet Order             DIET DYS 3 Room service appropriate? Yes with Assist; Fluid consistency: Thin  Diet effective now                             Consultants: Psychiatrist  Procedures: None    Medications:    chlorhexidine  7.5 mL Mouth Rinse BID   divalproex  2,000 mg Oral QHS   docusate sodium  100 mg Oral BID   enoxaparin (LOVENOX) injection  40 mg Subcutaneous Q24H   glycopyrrolate  1 mg Oral BID   lactulose  20 g Oral BID   lamoTRIgine  100 mg Oral Daily   levothyroxine  125 mcg Oral Q0600   loxapine  25 mg Oral TID   midodrine  2.5 mg Oral BID WC   mirabegron ER  50 mg Oral Daily   pantoprazole  40 mg Oral Daily   PARoxetine  40 mg Oral Daily   polyethylene glycol  34 g Oral Daily   topiramate  75 mg Oral BID   valbenazine  80 mg Oral QHS   Continuous Infusions:   Anti-infectives (From admission, onward)    Start     Dose/Rate Route Frequency Ordered Stop   04/11/23 1800  doxycycline (VIBRA-TABS) tablet 100 mg  Status:  Discontinued        100 mg Oral Every 12 hours 04/11/23 0734 04/11/23 0828   04/10/23 2000  vancomycin (VANCOREADY) IVPB 1750 mg/350 mL  Status:  Discontinued       Placed in "Followed by" Linked Group   1,750 mg 175 mL/hr over 120 Minutes Intravenous Every 24 hours 04/09/23 0015 04/09/23 1445   04/09/23 2200  cefTRIAXone (ROCEPHIN) 2 g in sodium chloride 0.9 % 100 mL IVPB  Status:  Discontinued        2 g 200 mL/hr over 30 Minutes Intravenous Every 24 hours 04/09/23 1445 04/11/23 0828   04/09/23 1500  doxycycline (VIBRAMYCIN) 100 mg in dextrose 5 % 250 mL IVPB  Status:  Discontinued        100 mg 125 mL/hr over 120 Minutes Intravenous Every 12 hours 04/09/23 1445 04/11/23 0734   04/09/23 1000  metroNIDAZOLE (FLAGYL) IVPB 500 mg  Status:  Discontinued        500 mg 100 mL/hr over 60 Minutes Intravenous Every 12 hours 04/08/23 2309 04/10/23 1436   04/09/23 0500  ceFEPIme (MAXIPIME) 2 g in sodium chloride 0.9 % 100 mL IVPB  Status:  Discontinued        2 g 200 mL/hr over 30 Minutes Intravenous Every 8 hours 04/09/23 0009 04/09/23 1445   04/09/23 0200  vancomycin  (VANCOCIN) IVPB 1000 mg/200 mL premix       Placed in "Followed by" Linked Group   1,000 mg 200 mL/hr over 60 Minutes Intravenous  Once 04/09/23 0015 04/09/23 0419   04/08/23 2315  ceFEPIme (MAXIPIME) 2 g in sodium chloride 0.9 % 100 mL IVPB  Status:  Discontinued        2 g 200 mL/hr over 30 Minutes Intravenous  Once 04/08/23 2309 04/08/23 2320   04/08/23 2315  vancomycin (VANCOCIN) IVPB 1000 mg/200 mL premix  Status:  Discontinued        1,000 mg 200 mL/hr over 60 Minutes Intravenous  Once 04/08/23 2309 04/08/23 2321   04/08/23 1915  ceFEPIme (MAXIPIME) 2 g in sodium chloride 0.9 % 100 mL IVPB        2 g 200 mL/hr over 30 Minutes Intravenous  Once 04/08/23 1900 04/08/23 2116   04/08/23 1915  metroNIDAZOLE (FLAGYL) IVPB 500 mg        500 mg 100 mL/hr over 60 Minutes Intravenous  Once 04/08/23 1900 04/08/23 2220   04/08/23 1915  vancomycin (VANCOCIN) IVPB 1000 mg/200 mL premix        1,000 mg 200 mL/hr over 60 Minutes Intravenous  Once 04/08/23 1900 04/08/23 2116              Family Communication/Anticipated D/C date and plan/Code Status   DVT prophylaxis: enoxaparin (LOVENOX) injection 40 mg Start: 04/09/23 1000     Code Status: Full Code  Family Communication: None Disposition Plan: Plan to discharge to long-term care facility   Status is: Inpatient Remains inpatient appropriate because: Awaiting placement       Subjective:   Interval event noted.  Patient is confused and cannot provide any history.  He keeps repeating his words.  Speech is slurred and difficult to understand.  Objective:    Vitals:   04/13/23 1538 04/13/23 1945 04/14/23 0404 04/14/23 0828  BP: 126/78 106/63 110/74 100/74  Pulse: 93 (!) 110 (!) 103 100  Resp: 18 19 19 18   Temp: (!) 97.3 F (36.3 C) 97.7 F (36.5 C) 97.6 F (36.4 C) 98 F (36.7 C)  TempSrc: Oral Oral Oral   SpO2: 97% 94% 98% 97%  Weight:       No data found.   Intake/Output Summary (Last 24 hours) at 04/14/2023  1039 Last data filed at 04/14/2023 0405 Gross per 24 hour  Intake --  Output 600 ml  Net -600 ml   Filed Weights   04/08/23 2358  Weight: 77.4 kg    Exam:  GEN: NAD SKIN: Warm and dry  EYES: No pallor or icterus ENT: MMM CV: RRR PULM: CTA B ABD: soft, ND, NT, +BS CNS: Alert but disoriented, speech is slurred and difficult to understand. EXT: No edema or tenderness PSYCH: He is repeating his own words (palilalia)       Data Reviewed:   I have personally reviewed following labs and imaging studies:  Labs: Labs show the following:   Basic Metabolic Panel: Recent Labs  Lab 04/08/23 1946 04/09/23 0450 04/10/23 0514 04/11/23 0549 04/12/23 0226 04/13/23 0532 04/14/23 0611  NA  --    < > 137 138 140 140 141  K  --    < > 3.6 3.7 3.9 3.8 3.5  CL  --    < > 106 107 106 107 105  CO2  --    < > 22 23 25 25 29   GLUCOSE  --    < > 71 91 96 90 89  BUN  --    < > 11 11 19 17  23*  CREATININE  --    < > 0.68 0.72 0.71 0.69 0.87  CALCIUM  --    < > 8.3* 8.3* 8.9 9.1 8.8*  MG 2.2  --   --   --   --   --   --    < > = values in this interval not displayed.   GFR Estimated Creatinine Clearance: 96.1 mL/min (by C-G formula based on SCr of 0.87 mg/dL). Liver Function Tests: Recent Labs  Lab 04/08/23 1845  AST 53*  ALT 25  ALKPHOS 93  BILITOT 0.7  PROT 6.7  ALBUMIN 3.5   No results for input(s): "LIPASE", "AMYLASE" in the last 168 hours. No results for input(s): "AMMONIA" in the last 168 hours. Coagulation profile Recent Labs  Lab 04/09/23 0450  INR 1.1    CBC: Recent Labs  Lab 04/10/23 0514 04/11/23 0549 04/12/23 0226 04/13/23 0532 04/14/23 0611  WBC 5.3 4.0 5.9 5.7 4.7  HGB 13.5 14.2 14.6 15.1 14.2  HCT 39.6 42.3 43.0 44.1 42.7  MCV 94.1 97.0 94.1 94.8 96.6  PLT 257 229 273 252 269   Cardiac Enzymes: Recent Labs  Lab 04/08/23 1946 04/11/23 0549 04/13/23 0532 04/14/23 0611  CKTOTAL 767* 437* 1,113* 1,055*   BNP (last 3 results) No results  for input(s): "PROBNP" in the last 8760 hours. CBG: Recent Labs  Lab 04/14/23 0845  GLUCAP 92   D-Dimer: No results for input(s): "DDIMER" in the last 72 hours. Hgb A1c: No results for input(s): "HGBA1C" in the last 72 hours. Lipid Profile: No results for input(s): "CHOL", "HDL", "LDLCALC", "TRIG", "CHOLHDL", "LDLDIRECT" in the last 72 hours. Thyroid function studies: No results for input(s): "TSH", "T4TOTAL", "T3FREE", "THYROIDAB" in the last 72 hours.  Invalid input(s): "FREET3" Anemia work up: No results for input(s): "VITAMINB12", "FOLATE", "FERRITIN", "TIBC", "IRON", "RETICCTPCT" in the last 72 hours. Sepsis Labs: Recent Labs  Lab 04/08/23 1946 04/08/23 2114 04/09/23 0450 04/10/23 0514 04/11/23 0549 04/12/23 0226 04/13/23 0532 04/14/23 0611  PROCALCITON <0.10  --  <0.10  --   --   --   --   --   WBC  --   --  5.5   < > 4.0 5.9 5.7 4.7  LATICACIDVEN 1.5 1.2  --   --   --   --   --   --    < > = values in this interval not displayed.    Microbiology Recent Results (from the past 240 hour(s))  Culture,  blood (routine x 2)     Status: None   Collection Time: 04/08/23  7:46 PM   Specimen: BLOOD  Result Value Ref Range Status   Specimen Description BLOOD RIGHT ANTECUBITAL  Final   Special Requests   Final    BOTTLES DRAWN AEROBIC AND ANAEROBIC Blood Culture adequate volume   Culture   Final    NO GROWTH 5 DAYS Performed at [facility], [address] [place], [place] [address]    Report Status 04/13/2023 FINAL  Final  Resp panel by RT-PCR (RSV, Flu A&B, Covid) Urine, Clean Catch     Status: None   Collection Time: 04/08/23  7:46 PM   Specimen: Urine, Clean Catch; Nasal Swab  Result Value Ref Range Status   SARS Coronavirus 2 by RT PCR NEGATIVE NEGATIVE Final    Comment: (NOTE) SARS-CoV-2 target nucleic acids are NOT DETECTED.  The SARS-CoV-2 RNA is generally detectable in upper respiratory specimens during the acute phase of infection. The  lowest concentration of SARS-CoV-2 viral copies this assay can detect is 138 copies/mL. A negative result does not preclude SARS-Cov-2 infection and should not be used as the sole basis for treatment or other patient management decisions. A negative result may occur with  improper specimen collection/handling, submission of specimen other than nasopharyngeal swab, presence of viral mutation(s) within the areas targeted by this assay, and inadequate number of viral copies(<138 copies/mL). A negative result must be combined with clinical observations, patient history, and epidemiological information. The expected result is Negative.  Fact Sheet for Patients:  BloggerCourse.com  Fact Sheet for Healthcare Providers:  SeriousBroker.it  This test is no t yet approved or cleared by the [place] FDA and  has been authorized for detection and/or diagnosis of SARS-CoV-2 by FDA under an Emergency Use Authorization (EUA). This EUA will remain  in effect (meaning this test can be used) for the duration of the COVID-19 declaration under Section 564(b)(1) of the Act, 21 U.S.C.section 360bbb-3(b)(1), unless the authorization is terminated  or revoked sooner.       Influenza A by PCR NEGATIVE NEGATIVE Final   Influenza B by PCR NEGATIVE NEGATIVE Final    Comment: (NOTE) The Xpert Xpress SARS-CoV-2/FLU/RSV plus assay is intended as an aid in the diagnosis of influenza from Nasopharyngeal swab specimens and should not be used as a sole basis for treatment. Nasal washings and aspirates are unacceptable for Xpert Xpress SARS-CoV-2/FLU/RSV testing.  Fact Sheet for Patients: BloggerCourse.com  Fact Sheet for Healthcare Providers: SeriousBroker.it  This test is not yet approved or cleared by the [place] FDA and has been authorized for detection and/or diagnosis of SARS-CoV-2 by FDA under  an Emergency Use Authorization (EUA). This EUA will remain in effect (meaning this test can be used) for the duration of the COVID-19 declaration under Section 564(b)(1) of the Act, 21 U.S.C. section 360bbb-3(b)(1), unless the authorization is terminated or revoked.     Resp Syncytial Virus by PCR NEGATIVE NEGATIVE Final    Comment: (NOTE) Fact Sheet for Patients: BloggerCourse.com  Fact Sheet for Healthcare Providers: SeriousBroker.it  This test is not yet approved or cleared by the [place] FDA and has been authorized for detection and/or diagnosis of SARS-CoV-2 by FDA under an Emergency Use Authorization (EUA). This EUA will remain in effect (meaning this test can be used) for the duration of the COVID-19 declaration under Section 564(b)(1) of the Act, 21 U.S.C. section 360bbb-3(b)(1), unless the authorization is terminated or revoked.  Performed  at [facility], [address] [place], [place] [address]   Culture, blood (routine x 2)     Status: None   Collection Time: 04/08/23  8:11 PM   Specimen: BLOOD  Result Value Ref Range Status   Specimen Description BLOOD BLOOD LEFT ARM  Final   Special Requests   Final    BOTTLES DRAWN AEROBIC AND ANAEROBIC Blood Culture results may not be optimal due to an excessive volume of blood received in culture bottles   Culture   Final    NO GROWTH 5 DAYS Performed at [facility], [address], [place], [place] [address]    Report Status 04/13/2023 FINAL  Final    Procedures and diagnostic studies:  No results found.             LOS: 6 days   Nicollette Wilhelmi  [facility]   Pager on [email or web address]. If 7PM-7AM, please contact night-coverage at [email or web address]     04/14/2023, 10:39 AM

## 2023-04-14 NOTE — Plan of Care (Signed)
  Problem: Clinical Measurements: Goal: Ability to maintain clinical measurements within normal limits will improve Outcome: Progressing Goal: Will remain free from infection Outcome: Progressing Goal: Diagnostic test results will improve Outcome: Progressing Goal: Respiratory complications will improve Outcome: Progressing Goal: Cardiovascular complication will be avoided Outcome: Progressing   Problem: Safety: Goal: Ability to remain free from injury will improve Outcome: Progressing   Problem: Elimination: Goal: Will not experience complications related to bowel motility Outcome: Progressing Goal: Will not experience complications related to urinary retention Outcome: Progressing

## 2023-04-14 NOTE — TOC Progression Note (Signed)
Transition of Care [facility]) - Progression Note    Patient Details  Name: Jared Mayer MRN: [id number] Date of Birth: 1970-12-31  [facility]) CM/SW Contact  Margarito Liner, LCSW Phone Number: 04/14/2023, 4:57 PM  Clinical Narrative:   CSW uploaded requested clinicals into [place] [facility] for PASARR review. Called mom today and provided update.  Expected Discharge Plan: Assisted Living Barriers to Discharge: Continued Medical Work up  Expected Discharge Plan and Services       Living arrangements for the past 2 months: Assisted Living Facility                                       Social Determinants of Health (SDOH) Interventions SDOH Screenings   Food Insecurity: No Food Insecurity (04/12/2023)  Housing: Patient Unable To Answer (04/12/2023)  Transportation Needs: Patient Unable To Answer (04/12/2023)  Utilities: Patient Unable To Answer (04/12/2023)  Financial Resource Strain: Low Risk  (03/29/2018)  Physical Activity: Unknown (03/29/2018)  Social Connections: Unknown (03/29/2018)  Stress: No Stress Concern Present (03/29/2018)  Tobacco Use: Low Risk  (04/08/2023)    Readmission Risk Interventions     No data to display

## 2023-04-15 DIAGNOSIS — R410 Disorientation, unspecified: Secondary | ICD-10-CM | POA: Diagnosis not present

## 2023-04-15 NOTE — Care Management Important Message (Signed)
Important Message  Patient Details  Name: Jared Mayer MRN: [id number] Date of Birth: [birth year]   Important Message Given:  Yes - Medicare IM  I reviewed the Important Message from Medicare with the patients legal guardian, [name] by phone [phone] and she stated she understood these rights. I wished him a speedy recovery and thanked her for her time.   Olegario Messier A Kendrah Lovern 04/15/2023, 3:51 PM

## 2023-04-15 NOTE — TOC Progression Note (Signed)
Transition of Care [facility]) - Progression Note    Patient Details  Name: Jared Mayer MRN: [id number] Date of Birth: September 02, 1970  Transition of Care [facility]) CM/SW Contact  Chapman Fitch, RN Phone Number: 04/15/2023, 4:43 PM  Clinical Narrative:     Sherron Monday with Archie Patten at [facility] [phone] Provided her with guardian information.  She will reach out to guardian in order to proceed with PASRR  Expected Discharge Plan: Assisted Living Barriers to Discharge: Continued Medical Work up  Expected Discharge Plan and Services       Living arrangements for the past 2 months: Assisted Living Facility                                       Social Determinants of Health (SDOH) Interventions SDOH Screenings   Food Insecurity: No Food Insecurity (04/12/2023)  Housing: Patient Unable To Answer (04/12/2023)  Transportation Needs: Patient Unable To Answer (04/12/2023)  Utilities: Patient Unable To Answer (04/12/2023)  Financial Resource Strain: Low Risk  (03/29/2018)  Physical Activity: Unknown (03/29/2018)  Social Connections: Unknown (03/29/2018)  Stress: No Stress Concern Present (03/29/2018)  Tobacco Use: Low Risk  (04/08/2023)    Readmission Risk Interventions     No data to display

## 2023-04-15 NOTE — Progress Notes (Signed)
Physical Therapy Treatment Patient Details Name: Jared Mayer MRN: [id number] DOB: 1971/02/28 Today's Date: 04/15/2023   History of Present Illness Jared Mayer is a 52yoM who comes to [facility] on 04/08/23 from group home after progression in behavior disturbance- he has been more irritable, erratic behavior, repetitive speech, falls and stumbles. PMH dementia, bipolar type schizoaffective d/o, intellectual disability, drug-induced parkinsonism Pt lives in group home at baseline. Was here in ED from 11/23-11/28 for similar issue.    PT Comments  Pt received in bed leaning to Right side, assisted to EOB with ModA of 2, however anticipate pt is physically capable to perform task with significantly less assist in his own environment. Pt stood from bed with MinA and with guidance of one assist in front of him and additional assist following for safety, pt was able to ambulate around nurse's station without difficulty or LOB. No buckling or shuffle noted, good foot clearance, easily re-directed back to room. Pt positioned in recliner with supportive pillows on Right side to prevent lean. Monitored pt for several minutes to ensure safety. Chair alarm in place, nursing notified at completion of visit.    If plan is discharge home, recommend the following: Assist for transportation;Assistance with cooking/housework;Help with stairs or ramp for entrance;Assistance with feeding;A little help with walking and/or transfers;A lot of help with bathing/dressing/bathroom   Can travel by private vehicle     Yes  Equipment Recommendations  None recommended by PT    Recommendations for Other Services       Precautions / Restrictions Precautions Precautions: Fall Restrictions Weight Bearing Restrictions: No     Mobility  Bed Mobility Overal bed mobility: Needs Assistance Bed Mobility: Supine to Sit     Supine to sit: Max assist, HOB elevated     General bed mobility comments: Anticipate pt is able to  perform bed mobility without significant functional assist within his own environment    Transfers Overall transfer level: Needs assistance Equipment used: 2 person hand held assist Transfers: Sit to/from Stand Sit to Stand: Min assist, +2 physical assistance           General transfer comment: Anticipate pt is able to stand with less physical assist within his own environment    Ambulation/Gait Ambulation/Gait assistance: Contact guard assist, +2 physical assistance (+2 for safety due to not knowing pt's baseline) Gait Distance (Feet): 165 Feet Assistive device: 2 person hand held assist Gait Pattern/deviations: Step-through pattern       General Gait Details: Pt able to ambulate around nurse's station with therapist walking in front guiding him and another assist behind for safety. No LOB   Stairs             Wheelchair Mobility     Tilt Bed    Modified Rankin (Stroke Patients Only)       Balance Overall balance assessment: Needs assistance Sitting-balance support: Feet supported Sitting balance-Leahy Scale: Fair     Standing balance support: During functional activity, Bilateral upper extremity supported Standing balance-Leahy Scale: Fair Standing balance comment: Pt walked around Nurse's station with HHA to guide, not AD, no LOB                            Cognition Arousal: Alert Behavior During Therapy: WFL for tasks assessed/performed Overall Cognitive Status: No family/caregiver present to determine baseline cognitive functioning  Following Commands: Follows one step commands inconsistently                Exercises      General Comments General comments (skin integrity, edema, etc.): Pt able to answer questions appropriately this date and progress gait training      Pertinent Vitals/Pain Pain Assessment Pain Assessment: No/denies pain    Home Living                           Prior Function            PT Goals (current goals can now be found in the care plan section) Progress towards PT goals: Progressing toward goals    Frequency    Min 1X/week      PT Plan      Co-evaluation              AM-PAC PT "6 Clicks" Mobility   Outcome Measure  Help needed turning from your back to your side while in a flat bed without using bedrails?: A Lot Help needed moving from lying on your back to sitting on the side of a flat bed without using bedrails?: A Lot Help needed moving to and from a bed to a chair (including a wheelchair)?: A Little Help needed standing up from a chair using your arms (e.g., wheelchair or bedside chair)?: A Little Help needed to walk in hospital room?: A Little Help needed climbing 3-5 steps with a railing? : A Lot 6 Click Score: 15    End of Session Equipment Utilized During Treatment: Gait belt Activity Tolerance: Patient tolerated treatment well Patient left: in chair;with call bell/phone within reach;with chair alarm set Nurse Communication: Mobility status;Other (comment) (Improved mobility) PT Visit Diagnosis: Other abnormalities of gait and mobility (R26.89);Muscle weakness (generalized) (M62.81);History of falling (Z91.81)     Time: 9485-4627 PT Time Calculation (min) (ACUTE ONLY): 32 min  Charges:    $Gait Training: 8-22 mins $Therapeutic Activity: 8-22 mins PT General Charges $$ ACUTE PT VISIT: 1 Visit                    Zadie Cleverly, PTA  Jannet Askew 04/15/2023, 12:40 PM

## 2023-04-15 NOTE — Progress Notes (Signed)
Progress Note    Channer Bielawski  AVW:[id number] DOB: 01-Mar-1971  DOA: 04/08/2023 PCP: Almetta Lovely, [facility]      Brief Narrative:    Medical records reviewed and are as summarized below:  Magnus Aurich is a 52 y.o. male with medical history significant for intellectual disability, schizoaffective disorder, Lewy body dementia, dyslipidemia, drug-induced parkinsonism, hypothyroidism, who presented today for hospital because of altered mental status, disorientation, repetitive speech pattern, erratic behavior and recurrent falls.  Reportedly, he had been more aggressive at the group home than usual.      Assessment/Plan:   Principal Problem:   Altered mental status Active Problems:   Rhabdomyolysis   Schizoaffective disorder, bipolar type (HCC)   Dyslipidemia   Hypothyroidism   Anxiety and depression   Sepsis without acute organ dysfunction (HCC)   Multiple falls    Body mass index is 25.95 kg/m.   Acute metabolic encephalopathy on underlying Lewy body dementia: He is still confused.  It is unclear what his actual baseline mental status is.  He is no longer lethargic.   Rhabdomyolysis: CK down from 1,113  to 1,055.  Statin on hold.  Monitor CK levels.  Check TSH. Chart review showed that CK was 4,622 on 03/11/2021.   Dysphagia: Continue dysphagia 3 diet   Vitamin D deficiency: Vitamin D level on 04/07/2023 from an outside lab was 11.1.  Started on high-dose weekly vitamin D supplement on 04/15/2023   Schizoaffective disorder, erratic behavior, depression, anxiety: Continue psychotropics.  Patient does not meet criteria for admission to inpatient behavioral health unit.  Unfortunately, group home will not take him back.  Follow-up with social worker to assist with disposition.   Multiple falls: Plan to discharge to long-term care facility.    Diet Order             DIET DYS 3 Room service appropriate? Yes with Assist; Fluid consistency: Thin  Diet  effective now                            Consultants: Psychiatrist  Procedures: None    Medications:    chlorhexidine  7.5 mL Mouth Rinse BID   divalproex  2,000 mg Oral QHS   docusate sodium  100 mg Oral BID   enoxaparin (LOVENOX) injection  40 mg Subcutaneous Q24H   glycopyrrolate  1 mg Oral BID   lactulose  20 g Oral BID   lamoTRIgine  100 mg Oral Daily   levothyroxine  125 mcg Oral Q0600   loxapine  25 mg Oral TID   midodrine  2.5 mg Oral BID WC   mirabegron ER  50 mg Oral Daily   pantoprazole  40 mg Oral Daily   PARoxetine  40 mg Oral Daily   polyethylene glycol  34 g Oral Daily   topiramate  75 mg Oral BID   valbenazine  80 mg Oral QHS   Vitamin D (Ergocalciferol)  50,000 Units Oral Q7 days   Continuous Infusions:   Anti-infectives (From admission, onward)    Start     Dose/Rate Route Frequency Ordered Stop   04/11/23 1800  doxycycline (VIBRA-TABS) tablet 100 mg  Status:  Discontinued        100 mg Oral Every 12 hours 04/11/23 0734 04/11/23 0828   04/10/23 2000  vancomycin (VANCOREADY) IVPB 1750 mg/350 mL  Status:  Discontinued       Placed in "Followed by" Linked Group  1,750 mg 175 mL/hr over 120 Minutes Intravenous Every 24 hours 04/09/23 0015 04/09/23 1445   04/09/23 2200  cefTRIAXone (ROCEPHIN) 2 g in sodium chloride 0.9 % 100 mL IVPB  Status:  Discontinued        2 g 200 mL/hr over 30 Minutes Intravenous Every 24 hours 04/09/23 1445 04/11/23 0828   04/09/23 1500  doxycycline (VIBRAMYCIN) 100 mg in dextrose 5 % 250 mL IVPB  Status:  Discontinued        100 mg 125 mL/hr over 120 Minutes Intravenous Every 12 hours 04/09/23 1445 04/11/23 0734   04/09/23 1000  metroNIDAZOLE (FLAGYL) IVPB 500 mg  Status:  Discontinued        500 mg 100 mL/hr over 60 Minutes Intravenous Every 12 hours 04/08/23 2309 04/10/23 1436   04/09/23 0500  ceFEPIme (MAXIPIME) 2 g in sodium chloride 0.9 % 100 mL IVPB  Status:  Discontinued        2 g 200 mL/hr over 30  Minutes Intravenous Every 8 hours 04/09/23 0009 04/09/23 1445   04/09/23 0200  vancomycin (VANCOCIN) IVPB 1000 mg/200 mL premix       Placed in "Followed by" Linked Group   1,000 mg 200 mL/hr over 60 Minutes Intravenous  Once 04/09/23 0015 04/09/23 0419   04/08/23 2315  ceFEPIme (MAXIPIME) 2 g in sodium chloride 0.9 % 100 mL IVPB  Status:  Discontinued        2 g 200 mL/hr over 30 Minutes Intravenous  Once 04/08/23 2309 04/08/23 2320   04/08/23 2315  vancomycin (VANCOCIN) IVPB 1000 mg/200 mL premix  Status:  Discontinued        1,000 mg 200 mL/hr over 60 Minutes Intravenous  Once 04/08/23 2309 04/08/23 2321   04/08/23 1915  ceFEPIme (MAXIPIME) 2 g in sodium chloride 0.9 % 100 mL IVPB        2 g 200 mL/hr over 30 Minutes Intravenous  Once 04/08/23 1900 04/08/23 2116   04/08/23 1915  metroNIDAZOLE (FLAGYL) IVPB 500 mg        500 mg 100 mL/hr over 60 Minutes Intravenous  Once 04/08/23 1900 04/08/23 2220   04/08/23 1915  vancomycin (VANCOCIN) IVPB 1000 mg/200 mL premix        1,000 mg 200 mL/hr over 60 Minutes Intravenous  Once 04/08/23 1900 04/08/23 2116              Family Communication/Anticipated D/C date and plan/Code Status   DVT prophylaxis: enoxaparin (LOVENOX) injection 40 mg Start: 04/09/23 1000     Code Status: Full Code  Family Communication: None Disposition Plan: Plan to discharge to long-term care facility   Status is: Inpatient Remains inpatient appropriate because: Awaiting placement       Subjective:   Interval is noted.  He is confused and cannot provide any history.  He kept repeating everything I said.  Objective:    Vitals:   04/14/23 2013 04/14/23 2018 04/15/23 0903 04/15/23 0914  BP: 114/81  115/78   Pulse:  (!) 101 (!) 116 (!) 102  Resp: 20     Temp: 98.3 F (36.8 C)  (!) 97.5 F (36.4 C)   TempSrc: Axillary     SpO2: 98% 95% 94%   Weight:       No data found.   Intake/Output Summary (Last 24 hours) at 04/15/2023 1552 Last  data filed at 04/15/2023 1003 Gross per 24 hour  Intake 120 ml  Output 400 ml  Net -280 ml  Filed Weights   04/08/23 2358  Weight: 77.4 kg    Exam:   GEN: NAD, drooling from the mouth SKIN: Warm and dry EYES: No pallor or icterus ENT: MMM CV: RRR PULM: CTA B ABD: soft, ND, NT, +BS CNS: AAO x 1 (person).  Resting tremors noted on bilateral hands EXT: No edema or tenderness PSYCH: Echolalia (he was repeating my words).  Poor insight and judgment.     Data Reviewed:   I have personally reviewed following labs and imaging studies:  Labs: Labs show the following:   Basic Metabolic Panel: Recent Labs  Lab 04/08/23 1946 04/09/23 0450 04/10/23 0514 04/11/23 0549 04/12/23 0226 04/13/23 0532 04/14/23 0611  NA  --    < > 137 138 140 140 141  K  --    < > 3.6 3.7 3.9 3.8 3.5  CL  --    < > 106 107 106 107 105  CO2  --    < > 22 23 25 25 29   GLUCOSE  --    < > 71 91 96 90 89  BUN  --    < > 11 11 19 17  23*  CREATININE  --    < > 0.68 0.72 0.71 0.69 0.87  CALCIUM  --    < > 8.3* 8.3* 8.9 9.1 8.8*  MG 2.2  --   --   --   --   --   --    < > = values in this interval not displayed.   GFR Estimated Creatinine Clearance: 96.1 mL/min (by C-G formula based on SCr of 0.87 mg/dL). Liver Function Tests: Recent Labs  Lab 04/08/23 1845  AST 53*  ALT 25  ALKPHOS 93  BILITOT 0.7  PROT 6.7  ALBUMIN 3.5   No results for input(s): "LIPASE", "AMYLASE" in the last 168 hours. No results for input(s): "AMMONIA" in the last 168 hours. Coagulation profile Recent Labs  Lab 04/09/23 0450  INR 1.1    CBC: Recent Labs  Lab 04/10/23 0514 04/11/23 0549 04/12/23 0226 04/13/23 0532 04/14/23 0611  WBC 5.3 4.0 5.9 5.7 4.7  HGB 13.5 14.2 14.6 15.1 14.2  HCT 39.6 42.3 43.0 44.1 42.7  MCV 94.1 97.0 94.1 94.8 96.6  PLT 257 229 273 252 269   Cardiac Enzymes: Recent Labs  Lab 04/08/23 1946 04/11/23 0549 04/13/23 0532 04/14/23 0611  CKTOTAL 767* 437* 1,113* 1,055*   BNP  (last 3 results) No results for input(s): "PROBNP" in the last 8760 hours. CBG: Recent Labs  Lab 04/14/23 0845 04/14/23 1148  GLUCAP 92 109*   D-Dimer: No results for input(s): "DDIMER" in the last 72 hours. Hgb A1c: No results for input(s): "HGBA1C" in the last 72 hours. Lipid Profile: No results for input(s): "CHOL", "HDL", "LDLCALC", "TRIG", "CHOLHDL", "LDLDIRECT" in the last 72 hours. Thyroid function studies: Recent Labs    04/14/23 0900  TSH 2.575   Anemia work up: No results for input(s): "VITAMINB12", "FOLATE", "FERRITIN", "TIBC", "IRON", "RETICCTPCT" in the last 72 hours. Sepsis Labs: Recent Labs  Lab 04/08/23 1946 04/08/23 2114 04/09/23 0450 04/10/23 0514 04/11/23 0549 04/12/23 0226 04/13/23 0532 04/14/23 0611  PROCALCITON <0.10  --  <0.10  --   --   --   --   --   WBC  --   --  5.5   < > 4.0 5.9 5.7 4.7  LATICACIDVEN 1.5 1.2  --   --   --   --   --   --    < > =  values in this interval not displayed.    Microbiology Recent Results (from the past 240 hour(s))  Culture, blood (routine x 2)     Status: None   Collection Time: 04/08/23  7:46 PM   Specimen: BLOOD  Result Value Ref Range Status   Specimen Description BLOOD RIGHT ANTECUBITAL  Final   Special Requests   Final    BOTTLES DRAWN AEROBIC AND ANAEROBIC Blood Culture adequate volume   Culture   Final    NO GROWTH 5 DAYS Performed at [facility], [address] [place], [place] [address]    Report Status 04/13/2023 FINAL  Final  Resp panel by RT-PCR (RSV, Flu A&B, Covid) Urine, Clean Catch     Status: None   Collection Time: 04/08/23  7:46 PM   Specimen: Urine, Clean Catch; Nasal Swab  Result Value Ref Range Status   SARS Coronavirus 2 by RT PCR NEGATIVE NEGATIVE Final    Comment: (NOTE) SARS-CoV-2 target nucleic acids are NOT DETECTED.  The SARS-CoV-2 RNA is generally detectable in upper respiratory specimens during the acute phase of infection. The lowest concentration of  SARS-CoV-2 viral copies this assay can detect is 138 copies/mL. A negative result does not preclude SARS-Cov-2 infection and should not be used as the sole basis for treatment or other patient management decisions. A negative result may occur with  improper specimen collection/handling, submission of specimen other than nasopharyngeal swab, presence of viral mutation(s) within the areas targeted by this assay, and inadequate number of viral copies(<138 copies/mL). A negative result must be combined with clinical observations, patient history, and epidemiological information. The expected result is Negative.  Fact Sheet for Patients:  BloggerCourse.com  Fact Sheet for Healthcare Providers:  SeriousBroker.it  This test is no t yet approved or cleared by the [place] FDA and  has been authorized for detection and/or diagnosis of SARS-CoV-2 by FDA under an Emergency Use Authorization (EUA). This EUA will remain  in effect (meaning this test can be used) for the duration of the COVID-19 declaration under Section 564(b)(1) of the Act, 21 U.S.C.section 360bbb-3(b)(1), unless the authorization is terminated  or revoked sooner.       Influenza A by PCR NEGATIVE NEGATIVE Final   Influenza B by PCR NEGATIVE NEGATIVE Final    Comment: (NOTE) The Xpert Xpress SARS-CoV-2/FLU/RSV plus assay is intended as an aid in the diagnosis of influenza from Nasopharyngeal swab specimens and should not be used as a sole basis for treatment. Nasal washings and aspirates are unacceptable for Xpert Xpress SARS-CoV-2/FLU/RSV testing.  Fact Sheet for Patients: BloggerCourse.com  Fact Sheet for Healthcare Providers: SeriousBroker.it  This test is not yet approved or cleared by the [place] FDA and has been authorized for detection and/or diagnosis of SARS-CoV-2 by FDA under an Emergency Use  Authorization (EUA). This EUA will remain in effect (meaning this test can be used) for the duration of the COVID-19 declaration under Section 564(b)(1) of the Act, 21 U.S.C. section 360bbb-3(b)(1), unless the authorization is terminated or revoked.     Resp Syncytial Virus by PCR NEGATIVE NEGATIVE Final    Comment: (NOTE) Fact Sheet for Patients: BloggerCourse.com  Fact Sheet for Healthcare Providers: SeriousBroker.it  This test is not yet approved or cleared by the [place] FDA and has been authorized for detection and/or diagnosis of SARS-CoV-2 by FDA under an Emergency Use Authorization (EUA). This EUA will remain in effect (meaning this test can be used) for the duration of the COVID-19 declaration  under Section 564(b)(1) of the Act, 21 U.S.C. section 360bbb-3(b)(1), unless the authorization is terminated or revoked.  Performed at [facility], [address] [place], [place] [address]   Culture, blood (routine x 2)     Status: None   Collection Time: 04/08/23  8:11 PM   Specimen: BLOOD  Result Value Ref Range Status   Specimen Description BLOOD BLOOD LEFT ARM  Final   Special Requests   Final    BOTTLES DRAWN AEROBIC AND ANAEROBIC Blood Culture results may not be optimal due to an excessive volume of blood received in culture bottles   Culture   Final    NO GROWTH 5 DAYS Performed at [facility], [address] [place], [place] [address]    Report Status 04/13/2023 FINAL  Final    Procedures and diagnostic studies:  No results found.             LOS: 7 days   Alphia Behanna  [facility]   Pager on [email or web address]. If 7PM-7AM, please contact night-coverage at [email or web address]     04/15/2023, 3:52 PM

## 2023-04-15 NOTE — Care Management Important Message (Signed)
Important Message  Patient Details  Name: Jared Mayer MRN: [id number] Date of Birth: [birth year]   Important Message Given:  Other (see comment)  Left a message for patient's legal guardian, Jared Mayer, [phone]. Will await a return call.   Olegario Messier A Shakeerah Gradel 04/15/2023, 2:55 PM

## 2023-04-16 DIAGNOSIS — R410 Disorientation, unspecified: Secondary | ICD-10-CM | POA: Diagnosis not present

## 2023-04-16 NOTE — Progress Notes (Signed)
OT Cancellation Note  Patient Details Name: Jared Mayer MRN: [id number] DOB: 04-05-1971   Cancelled Treatment:    Reason Eval/Treat Not Completed: Fatigue/lethargy limiting ability to participate. Discussed with PT - pt unable to maintain level of alertness at a safe level to participate in therapy. Pt was asleep, and awakens to touch, but does not stay awake. Will re-attempt as able.  [name] L. Nichlos Kunzler, OTR/L  04/16/23, 2:56 PM

## 2023-04-16 NOTE — Progress Notes (Signed)
PT Cancellation Note  Patient Details Name: Sanjeev Renna MRN: [id number] DOB: 1971-01-15   Cancelled Treatment:    Reason Eval/Treat Not Completed: Fatigue/lethargy limiting ability to participate. Patient asleep upon PT arrival, awakens to therapist voice/touch however, multiple attempts to wake and encourage OOB mobility, but patient unable to maintain arousal for safe participation in PT services.   Creed Copper Fairly, PT, DPT 04/16/23 2:01 PM

## 2023-04-16 NOTE — Plan of Care (Signed)
  Problem: Safety: Goal: Ability to remain free from injury will improve Outcome: Progressing   Problem: Skin Integrity: Goal: Risk for impaired skin integrity will decrease Outcome: Progressing   Problem: Health Behavior/Discharge Planning: Goal: Ability to manage health-related needs will improve Outcome: Progressing

## 2023-04-16 NOTE — Progress Notes (Signed)
Progress Note    Jared Mayer  HQI:[id number] DOB: 1970-12-18  DOA: 04/08/2023 PCP: Almetta Lovely, [facility]      Brief Narrative:    Medical records reviewed and are as summarized below:  Jared Mayer is a 52 y.o. male with medical history significant for intellectual disability, schizoaffective disorder, Lewy body dementia, dyslipidemia, drug-induced parkinsonism, hypothyroidism, who presented today for hospital because of altered mental status, disorientation, repetitive speech pattern, erratic behavior and recurrent falls.  Reportedly, he had been more aggressive at the group home than usual.      Assessment/Plan:   Principal Problem:   Altered mental status Active Problems:   Rhabdomyolysis   Schizoaffective disorder, bipolar type (HCC)   Dyslipidemia   Hypothyroidism   Anxiety and depression   Sepsis without acute organ dysfunction (HCC)   Multiple falls    Body mass index is 25.95 kg/m.   Acute metabolic encephalopathy on underlying Lewy body dementia: He is still confused.  It is unclear what his actual baseline mental status is.  He is not lethargic.   Rhabdomyolysis: CK down from 1,113  to 1,055.  Statin on hold.  Monitor CK levels.  TSH was normal (2.575) Chart review showed that CK was 4,622 on 03/11/2021.   Dysphagia: Continue dysphagia 3 diet   Vitamin D deficiency: Vitamin D level on 04/07/2023 from an outside lab was 11.1.  Started on high-dose weekly vitamin D supplement on 04/15/2023.    Schizoaffective disorder, erratic behavior, depression, anxiety: Continue psychotropics.  Patient does not meet criteria for admission to inpatient behavioral health unit.  Unfortunately, group home will not take him back.  Follow-up with social worker to assist with disposition.   Multiple falls: Plan to discharge to long-term care facility.   Plan discussed with Jared Mayer, mother over the phone  Diet Order             DIET DYS 3 Room service  appropriate? Yes with Assist; Fluid consistency: Thin  Diet effective now                            Consultants: Psychiatrist  Procedures: None    Medications:    chlorhexidine  7.5 mL Mouth Rinse BID   divalproex  2,000 mg Oral QHS   docusate sodium  100 mg Oral BID   enoxaparin (LOVENOX) injection  40 mg Subcutaneous Q24H   glycopyrrolate  1 mg Oral BID   lactulose  20 g Oral BID   lamoTRIgine  100 mg Oral Daily   levothyroxine  125 mcg Oral Q0600   loxapine  25 mg Oral TID   midodrine  2.5 mg Oral BID WC   mirabegron ER  50 mg Oral Daily   pantoprazole  40 mg Oral Daily   PARoxetine  40 mg Oral Daily   polyethylene glycol  34 g Oral Daily   topiramate  75 mg Oral BID   valbenazine  80 mg Oral QHS   Vitamin D (Ergocalciferol)  50,000 Units Oral Q7 days   Continuous Infusions:   Anti-infectives (From admission, onward)    Start     Dose/Rate Route Frequency Ordered Stop   04/11/23 1800  doxycycline (VIBRA-TABS) tablet 100 mg  Status:  Discontinued        100 mg Oral Every 12 hours 04/11/23 0734 04/11/23 0828   04/10/23 2000  vancomycin (VANCOREADY) IVPB 1750 mg/350 mL  Status:  Discontinued  Placed in "Followed by" Linked Group   1,750 mg 175 mL/hr over 120 Minutes Intravenous Every 24 hours 04/09/23 0015 04/09/23 1445   04/09/23 2200  cefTRIAXone (ROCEPHIN) 2 g in sodium chloride 0.9 % 100 mL IVPB  Status:  Discontinued        2 g 200 mL/hr over 30 Minutes Intravenous Every 24 hours 04/09/23 1445 04/11/23 0828   04/09/23 1500  doxycycline (VIBRAMYCIN) 100 mg in dextrose 5 % 250 mL IVPB  Status:  Discontinued        100 mg 125 mL/hr over 120 Minutes Intravenous Every 12 hours 04/09/23 1445 04/11/23 0734   04/09/23 1000  metroNIDAZOLE (FLAGYL) IVPB 500 mg  Status:  Discontinued        500 mg 100 mL/hr over 60 Minutes Intravenous Every 12 hours 04/08/23 2309 04/10/23 1436   04/09/23 0500  ceFEPIme (MAXIPIME) 2 g in sodium chloride 0.9 % 100 mL  IVPB  Status:  Discontinued        2 g 200 mL/hr over 30 Minutes Intravenous Every 8 hours 04/09/23 0009 04/09/23 1445   04/09/23 0200  vancomycin (VANCOCIN) IVPB 1000 mg/200 mL premix       Placed in "Followed by" Linked Group   1,000 mg 200 mL/hr over 60 Minutes Intravenous  Once 04/09/23 0015 04/09/23 0419   04/08/23 2315  ceFEPIme (MAXIPIME) 2 g in sodium chloride 0.9 % 100 mL IVPB  Status:  Discontinued        2 g 200 mL/hr over 30 Minutes Intravenous  Once 04/08/23 2309 04/08/23 2320   04/08/23 2315  vancomycin (VANCOCIN) IVPB 1000 mg/200 mL premix  Status:  Discontinued        1,000 mg 200 mL/hr over 60 Minutes Intravenous  Once 04/08/23 2309 04/08/23 2321   04/08/23 1915  ceFEPIme (MAXIPIME) 2 g in sodium chloride 0.9 % 100 mL IVPB        2 g 200 mL/hr over 30 Minutes Intravenous  Once 04/08/23 1900 04/08/23 2116   04/08/23 1915  metroNIDAZOLE (FLAGYL) IVPB 500 mg        500 mg 100 mL/hr over 60 Minutes Intravenous  Once 04/08/23 1900 04/08/23 2220   04/08/23 1915  vancomycin (VANCOCIN) IVPB 1000 mg/200 mL premix        1,000 mg 200 mL/hr over 60 Minutes Intravenous  Once 04/08/23 1900 04/08/23 2116              Family Communication/Anticipated D/C date and plan/Code Status   DVT prophylaxis: enoxaparin (LOVENOX) injection 40 mg Start: 04/09/23 1000     Code Status: Full Code  Family Communication: Jared Mayer, mother and legal guardian Disposition Plan: Plan to discharge to long-term care facility   Status is: Inpatient Remains inpatient appropriate because: Awaiting placement       Subjective:   Interval events noted.  He is unable to provide any history because of confusion.  He just repeats my words.  Jared Lush, RN, was at the bedside  Objective:    Vitals:   04/15/23 1644 04/15/23 2001 04/16/23 0347 04/16/23 0848  BP: 97/61 101/66 121/76 112/70  Pulse: (!) 101 (!) 109 (!) 109   Resp:  18 18 18   Temp: 98.2 F (36.8 C) 98.1 F (36.7 C) 98.2 F (36.8  C) 97.9 F (36.6 C)  TempSrc: Axillary Oral Oral   SpO2: 99% 95% 95% 97%  Weight:       No data found.   Intake/Output Summary (Last 24 hours) at  04/16/2023 1324 Last data filed at 04/16/2023 1051 Gross per 24 hour  Intake 240 ml  Output 450 ml  Net -210 ml   Filed Weights   04/08/23 2358  Weight: 77.4 kg    Exam:  GEN: NAD SKIN: Warm and dry EYES: No pallor or icterus ENT: MMM CV: RRR PULM: CTA B ABD: soft, ND, NT, +BS CNS: Alert but confused, tremors of bilateral hands EXT: No edema or tenderness PSYCH: Poor insight and judgment.  Echolalia.     Data Reviewed:   I have personally reviewed following labs and imaging studies:  Labs: Labs show the following:   Basic Metabolic Panel: Recent Labs  Lab 04/10/23 0514 04/11/23 0549 04/12/23 0226 04/13/23 0532 04/14/23 0611  NA 137 138 140 140 141  K 3.6 3.7 3.9 3.8 3.5  CL 106 107 106 107 105  CO2 22 23 25 25 29   GLUCOSE 71 91 96 90 89  BUN 11 11 19 17  23*  CREATININE 0.68 0.72 0.71 0.69 0.87  CALCIUM 8.3* 8.3* 8.9 9.1 8.8*   GFR Estimated Creatinine Clearance: 96.1 mL/min (by C-G formula based on SCr of 0.87 mg/dL). Liver Function Tests: No results for input(s): "AST", "ALT", "ALKPHOS", "BILITOT", "PROT", "ALBUMIN" in the last 168 hours.  No results for input(s): "LIPASE", "AMYLASE" in the last 168 hours. No results for input(s): "AMMONIA" in the last 168 hours. Coagulation profile No results for input(s): "INR", "PROTIME" in the last 168 hours.   CBC: Recent Labs  Lab 04/10/23 0514 04/11/23 0549 04/12/23 0226 04/13/23 0532 04/14/23 0611  WBC 5.3 4.0 5.9 5.7 4.7  HGB 13.5 14.2 14.6 15.1 14.2  HCT 39.6 42.3 43.0 44.1 42.7  MCV 94.1 97.0 94.1 94.8 96.6  PLT 257 229 273 252 269   Cardiac Enzymes: Recent Labs  Lab 04/11/23 0549 04/13/23 0532 04/14/23 0611  CKTOTAL 437* 1,113* 1,055*   BNP (last 3 results) No results for input(s): "PROBNP" in the last 8760 hours. CBG: Recent Labs   Lab 04/14/23 0845 04/14/23 1148  GLUCAP 92 109*   D-Dimer: No results for input(s): "DDIMER" in the last 72 hours. Hgb A1c: No results for input(s): "HGBA1C" in the last 72 hours. Lipid Profile: No results for input(s): "CHOL", "HDL", "LDLCALC", "TRIG", "CHOLHDL", "LDLDIRECT" in the last 72 hours. Thyroid function studies: Recent Labs    04/14/23 0900  TSH 2.575   Anemia work up: No results for input(s): "VITAMINB12", "FOLATE", "FERRITIN", "TIBC", "IRON", "RETICCTPCT" in the last 72 hours. Sepsis Labs: Recent Labs  Lab 04/11/23 0549 04/12/23 0226 04/13/23 0532 04/14/23 0611  WBC 4.0 5.9 5.7 4.7    Microbiology Recent Results (from the past 240 hour(s))  Culture, blood (routine x 2)     Status: None   Collection Time: 04/08/23  7:46 PM   Specimen: BLOOD  Result Value Ref Range Status   Specimen Description BLOOD RIGHT ANTECUBITAL  Final   Special Requests   Final    BOTTLES DRAWN AEROBIC AND ANAEROBIC Blood Culture adequate volume   Culture   Final    NO GROWTH 5 DAYS Performed at [facility], [address] [place], [place] [address]    Report Status 04/13/2023 FINAL  Final  Resp panel by RT-PCR (RSV, Flu A&B, Covid) Urine, Clean Catch     Status: None   Collection Time: 04/08/23  7:46 PM   Specimen: Urine, Clean Catch; Nasal Swab  Result Value Ref Range Status   SARS Coronavirus 2 by RT PCR NEGATIVE  NEGATIVE Final    Comment: (NOTE) SARS-CoV-2 target nucleic acids are NOT DETECTED.  The SARS-CoV-2 RNA is generally detectable in upper respiratory specimens during the acute phase of infection. The lowest concentration of SARS-CoV-2 viral copies this assay can detect is 138 copies/mL. A negative result does not preclude SARS-Cov-2 infection and should not be used as the sole basis for treatment or other patient management decisions. A negative result may occur with  improper specimen collection/handling, submission of specimen other than  nasopharyngeal swab, presence of viral mutation(s) within the areas targeted by this assay, and inadequate number of viral copies(<138 copies/mL). A negative result must be combined with clinical observations, patient history, and epidemiological information. The expected result is Negative.  Fact Sheet for Patients:  BloggerCourse.com  Fact Sheet for Healthcare Providers:  SeriousBroker.it  This test is no t yet approved or cleared by the [place] FDA and  has been authorized for detection and/or diagnosis of SARS-CoV-2 by FDA under an Emergency Use Authorization (EUA). This EUA will remain  in effect (meaning this test can be used) for the duration of the COVID-19 declaration under Section 564(b)(1) of the Act, 21 U.S.C.section 360bbb-3(b)(1), unless the authorization is terminated  or revoked sooner.       Influenza A by PCR NEGATIVE NEGATIVE Final   Influenza B by PCR NEGATIVE NEGATIVE Final    Comment: (NOTE) The Xpert Xpress SARS-CoV-2/FLU/RSV plus assay is intended as an aid in the diagnosis of influenza from Nasopharyngeal swab specimens and should not be used as a sole basis for treatment. Nasal washings and aspirates are unacceptable for Xpert Xpress SARS-CoV-2/FLU/RSV testing.  Fact Sheet for Patients: BloggerCourse.com  Fact Sheet for Healthcare Providers: SeriousBroker.it  This test is not yet approved or cleared by the [place] FDA and has been authorized for detection and/or diagnosis of SARS-CoV-2 by FDA under an Emergency Use Authorization (EUA). This EUA will remain in effect (meaning this test can be used) for the duration of the COVID-19 declaration under Section 564(b)(1) of the Act, 21 U.S.C. section 360bbb-3(b)(1), unless the authorization is terminated or revoked.     Resp Syncytial Virus by PCR NEGATIVE NEGATIVE Final    Comment:  (NOTE) Fact Sheet for Patients: BloggerCourse.com  Fact Sheet for Healthcare Providers: SeriousBroker.it  This test is not yet approved or cleared by the [place] FDA and has been authorized for detection and/or diagnosis of SARS-CoV-2 by FDA under an Emergency Use Authorization (EUA). This EUA will remain in effect (meaning this test can be used) for the duration of the COVID-19 declaration under Section 564(b)(1) of the Act, 21 U.S.C. section 360bbb-3(b)(1), unless the authorization is terminated or revoked.  Performed at [facility], [address] [place], [place] [address]   Culture, blood (routine x 2)     Status: None   Collection Time: 04/08/23  8:11 PM   Specimen: BLOOD  Result Value Ref Range Status   Specimen Description BLOOD BLOOD LEFT ARM  Final   Special Requests   Final    BOTTLES DRAWN AEROBIC AND ANAEROBIC Blood Culture results may not be optimal due to an excessive volume of blood received in culture bottles   Culture   Final    NO GROWTH 5 DAYS Performed at [facility], [address] [place], [place] [address]    Report Status 04/13/2023 FINAL  Final    Procedures and diagnostic studies:  No results found.  LOS: 8 days   Jared Mayer  [facility]   Pager on [email or web address]. If 7PM-7AM, please contact night-coverage at [email or web address]     04/16/2023, 1:24 PM

## 2023-04-17 DIAGNOSIS — R410 Disorientation, unspecified: Secondary | ICD-10-CM | POA: Diagnosis not present

## 2023-04-17 NOTE — Progress Notes (Signed)
   04/17/23 0838  Assess: MEWS Score  Temp 98 F (36.7 C)  BP 132/87  MAP (mmHg) 102  Pulse Rate (!) 118  Resp (!) 21  Level of Consciousness Alert  SpO2 97 %  O2 Device Room Air  Assess: MEWS Score  MEWS Temp 0  MEWS Systolic 0  MEWS Pulse 2  MEWS RR 1  MEWS LOC 0  MEWS Score 3  MEWS Score Color Yellow  Assess: if the MEWS score is Yellow or Red  Were vital signs accurate and taken at a resting state? No, vital signs rechecked  Does the patient meet 2 or more of the SIRS criteria? Yes  Does the patient have a confirmed or suspected source of infection? No  MEWS guidelines implemented  Yes, yellow  Treat  MEWS Interventions Considered administering scheduled or prn medications/treatments as ordered  Take Vital Signs  Increase Vital Sign Frequency  Yellow: Q2hr x1, continue Q4hrs until patient remains green for 12hrs  Escalate  MEWS: Escalate Yellow: Discuss with charge nurse and consider notifying provider and/or RRT  Notify: Charge Nurse/RN  Name of Charge Nurse/RN Notified Velvet  Provider Notification  Provider Name/Title Myriam Forehand  Date Provider Notified 04/17/23  Time Provider Notified [phone]  Method of Notification  (secure chat)  Notification Reason Change in status  Assess: SIRS CRITERIA  SIRS Temperature  0  SIRS Pulse 1  SIRS Respirations  1  SIRS WBC 0  SIRS Score Sum  2

## 2023-04-17 NOTE — Plan of Care (Signed)

## 2023-04-17 NOTE — Progress Notes (Signed)
Mobility Specialist - Progress Note   04/17/23 1525  Mobility  Activity Ambulated with assistance in hallway  Level of Assistance Moderate assist, patient does 50-74%  Assistive Device Other (Comment)  Distance Ambulated (ft) 25 ft  Activity Response Tolerated well  Mobility visit 1 Mobility     Pt lying in bed upon arrival, utilizing RA. Pt minimally verbally responsive, but agreeable to activity. Pt achieved EOB sitting with maxA +2. Pt feels a little clammy and damp, new gown and linen provided. Lateral R lean in sitting/standing. Pt ambulated in hallway with mod-maxA +2 using B HHA. VC for sequencing steps. Occasional assist to maintain midline balance as pt begins to lean anteriorly, in a slanted stance. Pt returned to room and assisted in semi-supine position with pillow wedged to support neck and UE. Alarm set, needs in reach. RN notified.   Filiberto Pinks Mobility Specialist 04/17/23, 3:33 PM

## 2023-04-17 NOTE — Progress Notes (Signed)
Progress Note    Melton Fineran  AOZ:[id number] DOB: Mar 25, 1971  DOA: 04/08/2023 PCP: Almetta Lovely, [facility]      Brief Narrative:    Medical records reviewed and are as summarized below:  Leondre Swearengen is a 52 y.o. male with medical history significant for intellectual disability, schizoaffective disorder, Lewy body dementia, dyslipidemia, drug-induced parkinsonism, hypothyroidism, who presented today for hospital because of altered mental status, disorientation, repetitive speech pattern, erratic behavior and recurrent falls.  Reportedly, he had been more aggressive at the group home than usual.      Assessment/Plan:   Principal Problem:   Altered mental status Active Problems:   Rhabdomyolysis   Schizoaffective disorder, bipolar type (HCC)   Dyslipidemia   Hypothyroidism   Anxiety and depression   Sepsis without acute organ dysfunction (HCC)   Multiple falls    Body mass index is 25.95 kg/m.   Acute metabolic encephalopathy on underlying Lewy body dementia: He still confused.  Suspect he is at baseline.   Rhabdomyolysis: CK down from 1,113  to 1,055.  Statin on hold.  Monitor CK levels.  TSH was normal (2.575) Chart review showed that CK was 4,622 on 03/11/2021.   Dysphagia: Continue dysphagia 3 diet   Vitamin D deficiency: Vitamin D level on 04/07/2023 from an outside lab was 11.1.  Started on high-dose weekly vitamin D supplement on 04/15/2023.    Schizoaffective disorder, erratic behavior, depression, anxiety: Continue psychotropics.  Patient does not meet criteria for admission to inpatient behavioral health unit.  Unfortunately, group home will not take him back.  Follow-up with social worker to assist with disposition.   Multiple falls: Plan to discharge to long-term care facility.     Diet Order             DIET DYS 3 Room service appropriate? Yes with Assist; Fluid consistency: Thin  Diet effective now                             Consultants: Psychiatrist  Procedures: None    Medications:    chlorhexidine  7.5 mL Mouth Rinse BID   divalproex  2,000 mg Oral QHS   docusate sodium  100 mg Oral BID   enoxaparin (LOVENOX) injection  40 mg Subcutaneous Q24H   glycopyrrolate  1 mg Oral BID   lactulose  20 g Oral BID   lamoTRIgine  100 mg Oral Daily   levothyroxine  125 mcg Oral Q0600   loxapine  25 mg Oral TID   midodrine  2.5 mg Oral BID WC   mirabegron ER  50 mg Oral Daily   pantoprazole  40 mg Oral Daily   PARoxetine  40 mg Oral Daily   polyethylene glycol  34 g Oral Daily   topiramate  75 mg Oral BID   valbenazine  80 mg Oral QHS   Vitamin D (Ergocalciferol)  50,000 Units Oral Q7 days   Continuous Infusions:   Anti-infectives (From admission, onward)    Start     Dose/Rate Route Frequency Ordered Stop   04/11/23 1800  doxycycline (VIBRA-TABS) tablet 100 mg  Status:  Discontinued        100 mg Oral Every 12 hours 04/11/23 0734 04/11/23 0828   04/10/23 2000  vancomycin (VANCOREADY) IVPB 1750 mg/350 mL  Status:  Discontinued       Placed in "Followed by" Linked Group   1,750 mg 175 mL/hr over 120 Minutes Intravenous  Every 24 hours 04/09/23 0015 04/09/23 1445   04/09/23 2200  cefTRIAXone (ROCEPHIN) 2 g in sodium chloride 0.9 % 100 mL IVPB  Status:  Discontinued        2 g 200 mL/hr over 30 Minutes Intravenous Every 24 hours 04/09/23 1445 04/11/23 0828   04/09/23 1500  doxycycline (VIBRAMYCIN) 100 mg in dextrose 5 % 250 mL IVPB  Status:  Discontinued        100 mg 125 mL/hr over 120 Minutes Intravenous Every 12 hours 04/09/23 1445 04/11/23 0734   04/09/23 1000  metroNIDAZOLE (FLAGYL) IVPB 500 mg  Status:  Discontinued        500 mg 100 mL/hr over 60 Minutes Intravenous Every 12 hours 04/08/23 2309 04/10/23 1436   04/09/23 0500  ceFEPIme (MAXIPIME) 2 g in sodium chloride 0.9 % 100 mL IVPB  Status:  Discontinued        2 g 200 mL/hr over 30 Minutes Intravenous Every 8 hours  04/09/23 0009 04/09/23 1445   04/09/23 0200  vancomycin (VANCOCIN) IVPB 1000 mg/200 mL premix       Placed in "Followed by" Linked Group   1,000 mg 200 mL/hr over 60 Minutes Intravenous  Once 04/09/23 0015 04/09/23 0419   04/08/23 2315  ceFEPIme (MAXIPIME) 2 g in sodium chloride 0.9 % 100 mL IVPB  Status:  Discontinued        2 g 200 mL/hr over 30 Minutes Intravenous  Once 04/08/23 2309 04/08/23 2320   04/08/23 2315  vancomycin (VANCOCIN) IVPB 1000 mg/200 mL premix  Status:  Discontinued        1,000 mg 200 mL/hr over 60 Minutes Intravenous  Once 04/08/23 2309 04/08/23 2321   04/08/23 1915  ceFEPIme (MAXIPIME) 2 g in sodium chloride 0.9 % 100 mL IVPB        2 g 200 mL/hr over 30 Minutes Intravenous  Once 04/08/23 1900 04/08/23 2116   04/08/23 1915  metroNIDAZOLE (FLAGYL) IVPB 500 mg        500 mg 100 mL/hr over 60 Minutes Intravenous  Once 04/08/23 1900 04/08/23 2220   04/08/23 1915  vancomycin (VANCOCIN) IVPB 1000 mg/200 mL premix        1,000 mg 200 mL/hr over 60 Minutes Intravenous  Once 04/08/23 1900 04/08/23 2116              Family Communication/Anticipated D/C date and plan/Code Status   DVT prophylaxis: enoxaparin (LOVENOX) injection 40 mg Start: 04/09/23 1000     Code Status: Full Code  Family Communication: None Disposition Plan: Plan to discharge to long-term care facility   Status is: Inpatient Remains inpatient appropriate because: Awaiting placement       Subjective:   Interval events noted.  He is unable to provide any history.  Sue Lush, RN, was at the bedside  Objective:    Vitals:   04/17/23 0237 04/17/23 0412 04/17/23 0838 04/17/23 0934  BP: 112/68 (!) 103/51 132/87 99/67  Pulse: (!) 104 (!) 101 (!) 118 (!) 117  Resp: 18 18 (!) 21 19  Temp: 98.1 F (36.7 C) 98.1 F (36.7 C) 98 F (36.7 C) 97.7 F (36.5 C)  TempSrc: Oral Oral  Oral  SpO2: 94% 98% 97% 95%  Weight:       No data found.  No intake or output data in the 24 hours  ending 04/17/23 1259  Filed Weights   04/08/23 2358  Weight: 77.4 kg    Exam:  GEN: NAD SKIN: Warm and  dry EYES: No pallor or icterus ENT: MMM CV: RRR PULM: CTA B ABD: soft, ND, NT, +BS CNS: Alert but disoriented EXT: No edema or tenderness PSYCH: Calm and cooperative.  Echolalia   Data Reviewed:   I have personally reviewed following labs and imaging studies:  Labs: Labs show the following:   Basic Metabolic Panel: Recent Labs  Lab 04/11/23 0549 04/12/23 0226 04/13/23 0532 04/14/23 0611  NA 138 140 140 141  K 3.7 3.9 3.8 3.5  CL 107 106 107 105  CO2 23 25 25 29   GLUCOSE 91 96 90 89  BUN 11 19 17  23*  CREATININE 0.72 0.71 0.69 0.87  CALCIUM 8.3* 8.9 9.1 8.8*   GFR Estimated Creatinine Clearance: 96.1 mL/min (by C-G formula based on SCr of 0.87 mg/dL). Liver Function Tests: No results for input(s): "AST", "ALT", "ALKPHOS", "BILITOT", "PROT", "ALBUMIN" in the last 168 hours.  No results for input(s): "LIPASE", "AMYLASE" in the last 168 hours. No results for input(s): "AMMONIA" in the last 168 hours. Coagulation profile No results for input(s): "INR", "PROTIME" in the last 168 hours.   CBC: Recent Labs  Lab 04/11/23 0549 04/12/23 0226 04/13/23 0532 04/14/23 0611  WBC 4.0 5.9 5.7 4.7  HGB 14.2 14.6 15.1 14.2  HCT 42.3 43.0 44.1 42.7  MCV 97.0 94.1 94.8 96.6  PLT 229 273 252 269   Cardiac Enzymes: Recent Labs  Lab 04/11/23 0549 04/13/23 0532 04/14/23 0611  CKTOTAL 437* 1,113* 1,055*   BNP (last 3 results) No results for input(s): "PROBNP" in the last 8760 hours. CBG: Recent Labs  Lab 04/14/23 0845 04/14/23 1148  GLUCAP 92 109*   D-Dimer: No results for input(s): "DDIMER" in the last 72 hours. Hgb A1c: No results for input(s): "HGBA1C" in the last 72 hours. Lipid Profile: No results for input(s): "CHOL", "HDL", "LDLCALC", "TRIG", "CHOLHDL", "LDLDIRECT" in the last 72 hours. Thyroid function studies: No results for input(s): "TSH",  "T4TOTAL", "T3FREE", "THYROIDAB" in the last 72 hours.  Invalid input(s): "FREET3"  Anemia work up: No results for input(s): "VITAMINB12", "FOLATE", "FERRITIN", "TIBC", "IRON", "RETICCTPCT" in the last 72 hours. Sepsis Labs: Recent Labs  Lab 04/11/23 0549 04/12/23 0226 04/13/23 0532 04/14/23 0611  WBC 4.0 5.9 5.7 4.7    Microbiology Recent Results (from the past 240 hour(s))  Culture, blood (routine x 2)     Status: None   Collection Time: 04/08/23  7:46 PM   Specimen: BLOOD  Result Value Ref Range Status   Specimen Description BLOOD RIGHT ANTECUBITAL  Final   Special Requests   Final    BOTTLES DRAWN AEROBIC AND ANAEROBIC Blood Culture adequate volume   Culture   Final    NO GROWTH 5 DAYS Performed at [facility], [address] [place], [place] [address]    Report Status 04/13/2023 FINAL  Final  Resp panel by RT-PCR (RSV, Flu A&B, Covid) Urine, Clean Catch     Status: None   Collection Time: 04/08/23  7:46 PM   Specimen: Urine, Clean Catch; Nasal Swab  Result Value Ref Range Status   SARS Coronavirus 2 by RT PCR NEGATIVE NEGATIVE Final    Comment: (NOTE) SARS-CoV-2 target nucleic acids are NOT DETECTED.  The SARS-CoV-2 RNA is generally detectable in upper respiratory specimens during the acute phase of infection. The lowest concentration of SARS-CoV-2 viral copies this assay can detect is 138 copies/mL. A negative result does not preclude SARS-Cov-2 infection and should not be used as the sole basis for treatment  or other patient management decisions. A negative result may occur with  improper specimen collection/handling, submission of specimen other than nasopharyngeal swab, presence of viral mutation(s) within the areas targeted by this assay, and inadequate number of viral copies(<138 copies/mL). A negative result must be combined with clinical observations, patient history, and epidemiological information. The expected result is Negative.  Fact  Sheet for Patients:  BloggerCourse.com  Fact Sheet for Healthcare Providers:  SeriousBroker.it  This test is no t yet approved or cleared by the [place] FDA and  has been authorized for detection and/or diagnosis of SARS-CoV-2 by FDA under an Emergency Use Authorization (EUA). This EUA will remain  in effect (meaning this test can be used) for the duration of the COVID-19 declaration under Section 564(b)(1) of the Act, 21 U.S.C.section 360bbb-3(b)(1), unless the authorization is terminated  or revoked sooner.       Influenza A by PCR NEGATIVE NEGATIVE Final   Influenza B by PCR NEGATIVE NEGATIVE Final    Comment: (NOTE) The Xpert Xpress SARS-CoV-2/FLU/RSV plus assay is intended as an aid in the diagnosis of influenza from Nasopharyngeal swab specimens and should not be used as a sole basis for treatment. Nasal washings and aspirates are unacceptable for Xpert Xpress SARS-CoV-2/FLU/RSV testing.  Fact Sheet for Patients: BloggerCourse.com  Fact Sheet for Healthcare Providers: SeriousBroker.it  This test is not yet approved or cleared by the [place] FDA and has been authorized for detection and/or diagnosis of SARS-CoV-2 by FDA under an Emergency Use Authorization (EUA). This EUA will remain in effect (meaning this test can be used) for the duration of the COVID-19 declaration under Section 564(b)(1) of the Act, 21 U.S.C. section 360bbb-3(b)(1), unless the authorization is terminated or revoked.     Resp Syncytial Virus by PCR NEGATIVE NEGATIVE Final    Comment: (NOTE) Fact Sheet for Patients: BloggerCourse.com  Fact Sheet for Healthcare Providers: SeriousBroker.it  This test is not yet approved or cleared by the [place] FDA and has been authorized for detection and/or diagnosis of SARS-CoV-2 by FDA under an  Emergency Use Authorization (EUA). This EUA will remain in effect (meaning this test can be used) for the duration of the COVID-19 declaration under Section 564(b)(1) of the Act, 21 U.S.C. section 360bbb-3(b)(1), unless the authorization is terminated or revoked.  Performed at [facility], [address] [place], [place] [address]   Culture, blood (routine x 2)     Status: None   Collection Time: 04/08/23  8:11 PM   Specimen: BLOOD  Result Value Ref Range Status   Specimen Description BLOOD BLOOD LEFT ARM  Final   Special Requests   Final    BOTTLES DRAWN AEROBIC AND ANAEROBIC Blood Culture results may not be optimal due to an excessive volume of blood received in culture bottles   Culture   Final    NO GROWTH 5 DAYS Performed at [facility], [address], [place], [place] [address]    Report Status 04/13/2023 FINAL  Final    Procedures and diagnostic studies:  No results found.             LOS: 9 days   Dariah Mcsorley  [employer]ered loss adjuster on [email or web address]. If 7PM-7AM, please contact night-coverage at [email or web address]     04/17/2023, 12:59 PM

## 2023-04-17 NOTE — TOC Progression Note (Signed)
Transition of Care [facility]) - Progression Note    Patient Details  Name: Jared Mayer MRN: [id number] Date of Birth: Jan 05, 1971  Transition of [facility] CM/SW Contact  Liliana Cline, LCSW Phone Number: 04/17/2023, 9:40 AM  Clinical Narrative:    PASRR pending.  Expected Discharge Plan: Assisted Living Barriers to Discharge: Continued Medical Work up  Expected Discharge Plan and Services       Living arrangements for the past 2 months: Assisted Living Facility                                       Social Determinants of Health (SDOH) Interventions SDOH Screenings   Food Insecurity: No Food Insecurity (04/12/2023)  Housing: Patient Unable To Answer (04/12/2023)  Transportation Needs: Patient Unable To Answer (04/12/2023)  Utilities: Patient Unable To Answer (04/12/2023)  Financial Resource Strain: Low Risk  (03/29/2018)  Physical Activity: Unknown (03/29/2018)  Social Connections: Unknown (03/29/2018)  Stress: No Stress Concern Present (03/29/2018)  Tobacco Use: Low Risk  (04/08/2023)    Readmission Risk Interventions     No data to display

## 2023-04-17 NOTE — Progress Notes (Signed)
   04/17/23 0934  Assess: MEWS Score  Temp 97.7 F (36.5 C)  BP 99/67  MAP (mmHg) 78  Pulse Rate (!) 117  Resp 19  Level of Consciousness Alert  SpO2 95 %  O2 Device Room Air  Assess: MEWS Score  MEWS Temp 0  MEWS Systolic 1  MEWS Pulse 2  MEWS RR 0  MEWS LOC 0  MEWS Score 3  MEWS Score Color Yellow  Assess: if the MEWS score is Yellow or Red  Were vital signs accurate and taken at a resting state? Yes  Does the patient meet 2 or more of the SIRS criteria? Yes  Does the patient have a confirmed or suspected source of infection? No  MEWS guidelines implemented  No, previously yellow, continue vital signs every 4 hours  Provider Notification  Provider Name/Title [facility]  Date Provider Notified 04/17/23  Time Provider Notified [phone]  Method of Notification Face-to-face  Notification Reason Change in status  Provider response At bedside;No new orders  Date of Provider Response 04/17/23  Time of Provider Response [id number]  Assess: SIRS CRITERIA  SIRS Temperature  0  SIRS Pulse 1  SIRS Respirations  0  SIRS WBC 0  SIRS Score Sum  1

## 2023-04-18 ENCOUNTER — Inpatient Hospital Stay: Payer: Medicare HMO

## 2023-04-18 DIAGNOSIS — R410 Disorientation, unspecified: Secondary | ICD-10-CM | POA: Diagnosis not present

## 2023-04-18 LAB — COMPREHENSIVE METABOLIC PANEL
ALT: 45 U/L — ABNORMAL HIGH (ref 0–44)
AST: 65 U/L — ABNORMAL HIGH (ref 15–41)
Albumin: 3.8 g/dL (ref 3.5–5.0)
Alkaline Phosphatase: 75 U/L (ref 38–126)
Anion gap: 12 (ref 5–15)
BUN: 32 mg/dL — ABNORMAL HIGH (ref 6–20)
CO2: 23 mmol/L (ref 22–32)
Calcium: 8.9 mg/dL (ref 8.9–10.3)
Chloride: 105 mmol/L (ref 98–111)
Creatinine, Ser: 0.84 mg/dL (ref 0.61–1.24)
GFR, Estimated: >60 mL/min (ref >60)
Glucose, Bld: 134 mg/dL — ABNORMAL HIGH (ref 70–99)
Potassium: 3.9 mmol/L (ref 3.5–5.1)
Sodium: 140 mmol/L (ref 135–145)
Total Bilirubin: 0.6 mg/dL (ref <1.2)
Total Protein: 7.2 g/dL (ref 6.5–8.1)

## 2023-04-18 LAB — CK: Total CK: 691 U/L — ABNORMAL HIGH (ref 49–397)

## 2023-04-18 LAB — CBC WITH DIFFERENTIAL/PLATELET
Abs Immature Granulocytes: 0.02 10*3/uL (ref 0.00–0.07)
Basophils Absolute: 0 10*3/uL (ref 0.0–0.1)
Basophils Relative: 0 %
Eosinophils Absolute: 0 10*3/uL (ref 0.0–0.5)
Eosinophils Relative: 0 %
HCT: 45.4 % (ref 39.0–52.0)
Hemoglobin: 15.6 g/dL (ref 13.0–17.0)
Immature Granulocytes: 0 %
Lymphocytes Relative: 13 %
Lymphs Abs: 0.7 10*3/uL (ref 0.7–4.0)
MCH: 32.8 pg (ref 26.0–34.0)
MCHC: 34.4 g/dL (ref 30.0–36.0)
MCV: 95.4 fL (ref 80.0–100.0)
Monocytes Absolute: 0.3 10*3/uL (ref 0.1–1.0)
Monocytes Relative: 6 %
Neutro Abs: 4.2 10*3/uL (ref 1.7–7.7)
Neutrophils Relative %: 81 %
Platelets: 306 10*3/uL (ref 150–400)
RBC: 4.76 MIL/uL (ref 4.22–5.81)
RDW: 14.7 % (ref 11.5–15.5)
WBC: 5.3 10*3/uL (ref 4.0–10.5)
nRBC: 0 % (ref 0.0–0.2)

## 2023-04-18 LAB — URINALYSIS, COMPLETE (UACMP) WITH MICROSCOPIC
Bacteria, UA: NONE SEEN
Bilirubin Urine: NEGATIVE
Glucose, UA: NEGATIVE mg/dL
Hgb urine dipstick: NEGATIVE
Ketones, ur: 20 mg/dL — AB
Leukocytes,Ua: NEGATIVE
Nitrite: NEGATIVE
Protein, ur: 30 mg/dL — AB
Specific Gravity, Urine: 1.031 — ABNORMAL HIGH (ref 1.005–1.030)
Squamous Epithelial / HPF: 0 /[HPF] (ref 0–5)
pH: 7 (ref 5.0–8.0)

## 2023-04-18 LAB — LACTIC ACID, PLASMA
Lactic Acid, Venous: 1.5 mmol/L (ref 0.5–1.9)
Lactic Acid, Venous: 3.4 mmol/L (ref 0.5–1.9)
Lactic Acid, Venous: 2.8 mmol/L (ref 0.5–1.9)

## 2023-04-18 LAB — MRSA NEXT GEN BY PCR, NASAL: MRSA by PCR Next Gen: NOT DETECTED

## 2023-04-18 LAB — PROCALCITONIN: Procalcitonin: <0.1 ng/mL

## 2023-04-18 MED ORDER — LACTATED RINGERS IV BOLUS
1000.0000 mL | Freq: Once | INTRAVENOUS | Status: AC
  Administered 2023-04-18: 1000 mL via INTRAVENOUS

## 2023-04-18 MED ORDER — IOHEXOL 300 MG/ML  SOLN
100.0000 mL | Freq: Once | INTRAMUSCULAR | Status: AC | PRN
  Administered 2023-04-18: 100 mL via INTRAVENOUS

## 2023-04-18 MED ORDER — PIPERACILLIN-TAZOBACTAM 3.375 G IVPB: 3.3750 g | Freq: Three times a day (TID) | INTRAVENOUS | Status: DC

## 2023-04-18 MED ORDER — VANCOMYCIN HCL 1500 MG/300ML IV SOLN
1500.0000 mg | Freq: Once | INTRAVENOUS | Status: DC
  Filled 2023-04-18: qty 300

## 2023-04-18 MED ORDER — LACTATED RINGERS IV BOLUS
500.0000 mL | Freq: Once | INTRAVENOUS | Status: AC
  Administered 2023-04-18: 500 mL via INTRAVENOUS

## 2023-04-18 MED ORDER — VANCOMYCIN HCL 1250 MG/250ML IV SOLN
1250.0000 mg | Freq: Two times a day (BID) | INTRAVENOUS | Status: DC
  Administered 2023-04-18 – 2023-04-19 (×2): 1250 mg via INTRAVENOUS
  Filled 2023-04-18 (×2): qty 250

## 2023-04-18 MED ORDER — SODIUM CHLORIDE 0.9 % IV SOLN
2.0000 g | Freq: Three times a day (TID) | INTRAVENOUS | Status: DC
  Filled 2023-04-18: qty 12.5

## 2023-04-18 MED ORDER — PIPERACILLIN-TAZOBACTAM 3.375 G IVPB
3.3750 g | Freq: Three times a day (TID) | INTRAVENOUS | Status: DC
  Administered 2023-04-18 – 2023-04-20 (×6): 3.375 g via INTRAVENOUS
  Filled 2023-04-18 (×6): qty 50

## 2023-04-18 MED ORDER — VANCOMYCIN HCL 1.5 G IV SOLR
1500.0000 mg | Freq: Once | INTRAVENOUS | Status: AC
  Administered 2023-04-18: 1500 mg via INTRAVENOUS
  Filled 2023-04-18: qty 30

## 2023-04-18 NOTE — Plan of Care (Signed)
  Problem: Fluid Volume: Goal: Hemodynamic stability will improve Outcome: Progressing   Problem: Respiratory: Goal: Ability to maintain adequate ventilation will improve Outcome: Progressing   Problem: Education: Goal: Knowledge of General Education information will improve Description: Including pain rating scale, medication(s)/side effects and non-pharmacologic comfort measures Outcome: Progressing   

## 2023-04-18 NOTE — Progress Notes (Addendum)
Progress Note    Jared Mayer  XBM:[id number] DOB: 05-05-1971  DOA: 04/08/2023 PCP: Almetta Lovely, [facility]      Brief Narrative:    Medical records reviewed and are as summarized below:  Jared Mayer is a 52 y.o. male with medical history significant for intellectual disability, schizoaffective disorder, Lewy body dementia, dyslipidemia, drug-induced parkinsonism, hypothyroidism, who presented today for hospital because of altered mental status, disorientation, repetitive speech pattern, erratic behavior and recurrent falls.  Reportedly, he had been more aggressive at the group home than usual.      Assessment/Plan:   Principal Problem:   Altered mental status Active Problems:   Rhabdomyolysis   Schizoaffective disorder, bipolar type (HCC)   Dyslipidemia   Hypothyroidism   Anxiety and depression   Sepsis without acute organ dysfunction (HCC)   Multiple falls    Body mass index is 25.95 kg/m.   Acute metabolic encephalopathy on underlying Lewy body dementia: Slightly more lethargic today.  Suspected sepsis: Rectal temperature 101.4, heart rate up to 127.  CBC, CMP, urinalysis, chest x-ray, procalcitonin, lactic acid and blood cultures have been ordered. CBC, lactic acid, procalcitonin and chest x-ray were unremarkable.  CMP showed mildly elevated liver enzymes otherwise unremarkable. Start broad-spectrum antibiotics (IV vancomycin and Zosyn) pending blood culture results. IV normal saline 1 L bolus was also ordered   Rhabdomyolysis: CK down from 1,113  to 1,055.  Statin on hold.  Monitor CK levels.  TSH was normal (2.575) Chart review showed that CK was 4,622 on 03/11/2021.   Dysphagia: Continue dysphagia 3 diet   Vitamin D deficiency: Vitamin D level on 04/07/2023 from an outside lab was 11.1.  Started on high-dose weekly vitamin D supplement on 04/15/2023.    Schizoaffective disorder, erratic behavior, depression, anxiety: Continue psychotropics.   Patient does not meet criteria for admission to inpatient behavioral health unit.  Unfortunately, group home will not take him back.  Follow-up with social worker to assist with disposition.   Multiple falls: Plan to discharge to long-term care facility.   Plan discussed with Jared Oiler, RN   ADDENDUM Urinalysis was unremarkable as far as infection is concerned.  Second lactic acid level came back at 3.4.  Additional liter of Ringer's lactate IV fluid was ordered.  He also has been started on IV antibiotics.  Obtain CT chest, abdomen and pelvis with contrast for further evaluation.  Plan discussed with Jared Mayer, mother and legal guardian, over the phone.   Diet Order             DIET DYS 3 Room service appropriate? Yes with Assist; Fluid consistency: Thin  Diet effective now                            Consultants: Psychiatrist  Procedures: None    Medications:    chlorhexidine  7.5 mL Mouth Rinse BID   divalproex  2,000 mg Oral QHS   docusate sodium  100 mg Oral BID   enoxaparin (LOVENOX) injection  40 mg Subcutaneous Q24H   glycopyrrolate  1 mg Oral BID   lactulose  20 g Oral BID   lamoTRIgine  100 mg Oral Daily   levothyroxine  125 mcg Oral Q0600   loxapine  25 mg Oral TID   midodrine  2.5 mg Oral BID WC   mirabegron ER  50 mg Oral Daily   pantoprazole  40 mg Oral Daily   PARoxetine  40 mg Oral  Daily   polyethylene glycol  34 g Oral Daily   topiramate  75 mg Oral BID   valbenazine  80 mg Oral QHS   Vitamin D (Ergocalciferol)  50,000 Units Oral Q7 days   Continuous Infusions:  lactated ringers     piperacillin-tazobactam (ZOSYN)  IV     vancomycin     vancomycin       Anti-infectives (From admission, onward)    Start     Dose/Rate Route Frequency Ordered Stop   04/18/23 2200  vancomycin (VANCOREADY) IVPB 1250 mg/250 mL        1,250 mg 166.7 mL/hr over 90 Minutes Intravenous Every 12 hours 04/18/23 1126     04/18/23 1400  ceFEPIme (MAXIPIME) 2 g in  sodium chloride 0.9 % 100 mL IVPB  Status:  Discontinued        2 g 200 mL/hr over 30 Minutes Intravenous Every 8 hours 04/18/23 1126 04/18/23 1128   04/18/23 1400  piperacillin-tazobactam (ZOSYN) IVPB 3.375 g        3.375 g 12.5 mL/hr over 240 Minutes Intravenous Every 8 hours 04/18/23 1128     04/18/23 1300  vancomycin (VANCOREADY) IVPB 1500 mg/300 mL  Status:  Discontinued        1,500 mg 150 mL/hr over 120 Minutes Intravenous  Once 04/18/23 1126 04/18/23 1152   04/18/23 1300  Vancomycin (VANCOCIN) 1,500 mg in sodium chloride 0.9 % 500 mL IVPB        1,500 mg 250 mL/hr over 120 Minutes Intravenous  Once 04/18/23 1152     04/11/23 1800  doxycycline (VIBRA-TABS) tablet 100 mg  Status:  Discontinued        100 mg Oral Every 12 hours 04/11/23 0734 04/11/23 0828   04/10/23 2000  vancomycin (VANCOREADY) IVPB 1750 mg/350 mL  Status:  Discontinued       Placed in "Followed by" Linked Group   1,750 mg 175 mL/hr over 120 Minutes Intravenous Every 24 hours 04/09/23 0015 04/09/23 1445   04/09/23 2200  cefTRIAXone (ROCEPHIN) 2 g in sodium chloride 0.9 % 100 mL IVPB  Status:  Discontinued        2 g 200 mL/hr over 30 Minutes Intravenous Every 24 hours 04/09/23 1445 04/11/23 0828   04/09/23 1500  doxycycline (VIBRAMYCIN) 100 mg in dextrose 5 % 250 mL IVPB  Status:  Discontinued        100 mg 125 mL/hr over 120 Minutes Intravenous Every 12 hours 04/09/23 1445 04/11/23 0734   04/09/23 1000  metroNIDAZOLE (FLAGYL) IVPB 500 mg  Status:  Discontinued        500 mg 100 mL/hr over 60 Minutes Intravenous Every 12 hours 04/08/23 2309 04/10/23 1436   04/09/23 0500  ceFEPIme (MAXIPIME) 2 g in sodium chloride 0.9 % 100 mL IVPB  Status:  Discontinued        2 g 200 mL/hr over 30 Minutes Intravenous Every 8 hours 04/09/23 0009 04/09/23 1445   04/09/23 0200  vancomycin (VANCOCIN) IVPB 1000 mg/200 mL premix       Placed in "Followed by" Linked Group   1,000 mg 200 mL/hr over 60 Minutes Intravenous  Once  04/09/23 0015 04/09/23 0419   04/08/23 2315  ceFEPIme (MAXIPIME) 2 g in sodium chloride 0.9 % 100 mL IVPB  Status:  Discontinued        2 g 200 mL/hr over 30 Minutes Intravenous  Once 04/08/23 2309 04/08/23 2320   04/08/23 2315  vancomycin (VANCOCIN) IVPB 1000 mg/200  mL premix  Status:  Discontinued        1,000 mg 200 mL/hr over 60 Minutes Intravenous  Once 04/08/23 2309 04/08/23 2321   04/08/23 1915  ceFEPIme (MAXIPIME) 2 g in sodium chloride 0.9 % 100 mL IVPB        2 g 200 mL/hr over 30 Minutes Intravenous  Once 04/08/23 1900 04/08/23 2116   04/08/23 1915  metroNIDAZOLE (FLAGYL) IVPB 500 mg        500 mg 100 mL/hr over 60 Minutes Intravenous  Once 04/08/23 1900 04/08/23 2220   04/08/23 1915  vancomycin (VANCOCIN) IVPB 1000 mg/200 mL premix        1,000 mg 200 mL/hr over 60 Minutes Intravenous  Once 04/08/23 1900 04/08/23 2116              Family Communication/Anticipated D/C date and plan/Code Status   DVT prophylaxis: enoxaparin (LOVENOX) injection 40 mg Start: 04/09/23 1000     Code Status: Full Code  Family Communication: None Disposition Plan: Plan to discharge to long-term care facility   Status is: Inpatient Remains inpatient appropriate because: Awaiting placement       Subjective:   Interval events noted.  He is confused and cannot provide any history.  Danelle Earthly, RN, was at the bedside.  She was concerned that patient was less interactive today and pocketing his medicines in his mouth.  She also reported outpatient follow-up to [facility] so she wanted to check a rectal temperature.   Objective:    Vitals:   04/18/23 0333 04/18/23 0900 04/18/23 0945 04/18/23 1020  BP: 128/88 120/85  124/78  Pulse: (!) 115 (!) 127  (!) 116  Resp: 20     Temp: 98.4 F (36.9 C) 99.5 F (37.5 C) (!) 101.4 F (38.6 C)   TempSrc: Oral  Rectal   SpO2: 97% 95%  93%  Weight:       No data found.   Intake/Output Summary (Last 24 hours) at 04/18/2023 1209 Last data filed  at 04/17/2023 1500 Gross per 24 hour  Intake --  Output 500 ml  Net -500 ml    Filed Weights   04/08/23 2358  Weight: 77.4 kg    Exam:  GEN: NAD SKIN: Warm and dry EYES: No pallor or icterus ENT: MMM CV: RRR PULM: CTA B ABD: soft, ND, NT, +BS CNS: Awake but less alert EXT: No edema or tenderness    Data Reviewed:   I have personally reviewed following labs and imaging studies:  Labs: Labs show the following:   Basic Metabolic Panel: Recent Labs  Lab 04/12/23 0226 04/13/23 0532 04/14/23 0611 04/18/23 1034  NA 140 140 141 140  K 3.9 3.8 3.5 3.9  CL 106 107 105 105  CO2 25 25 29 23   GLUCOSE 96 90 89 134*  BUN 19 17 23* 32*  CREATININE 0.71 0.69 0.87 0.84  CALCIUM 8.9 9.1 8.8* 8.9   GFR Estimated Creatinine Clearance: 99.5 mL/min (by C-G formula based on SCr of 0.84 mg/dL). Liver Function Tests: Recent Labs  Lab 04/18/23 1034  AST 65*  ALT 45*  ALKPHOS 75  BILITOT 0.6  PROT 7.2  ALBUMIN 3.8    No results for input(s): "LIPASE", "AMYLASE" in the last 168 hours. No results for input(s): "AMMONIA" in the last 168 hours. Coagulation profile No results for input(s): "INR", "PROTIME" in the last 168 hours.   CBC: Recent Labs  Lab 04/12/23 0226 04/13/23 0532 04/14/23 0611 04/18/23 1034  WBC 5.9  5.7 4.7 5.3  NEUTROABS  --   --   --  4.2  HGB 14.6 15.1 14.2 15.6  HCT 43.0 44.1 42.7 45.4  MCV 94.1 94.8 96.6 95.4  PLT 273 252 269 306   Cardiac Enzymes: Recent Labs  Lab 04/13/23 0532 04/14/23 0611  CKTOTAL 1,113* 1,055*   BNP (last 3 results) No results for input(s): "PROBNP" in the last 8760 hours. CBG: Recent Labs  Lab 04/14/23 0845 04/14/23 1148  GLUCAP 92 109*   D-Dimer: No results for input(s): "DDIMER" in the last 72 hours. Hgb A1c: No results for input(s): "HGBA1C" in the last 72 hours. Lipid Profile: No results for input(s): "CHOL", "HDL", "LDLCALC", "TRIG", "CHOLHDL", "LDLDIRECT" in the last 72 hours. Thyroid function  studies: No results for input(s): "TSH", "T4TOTAL", "T3FREE", "THYROIDAB" in the last 72 hours.  Invalid input(s): "FREET3"  Anemia work up: No results for input(s): "VITAMINB12", "FOLATE", "FERRITIN", "TIBC", "IRON", "RETICCTPCT" in the last 72 hours. Sepsis Labs: Recent Labs  Lab 04/12/23 0226 04/13/23 0532 04/14/23 0611 04/18/23 1034  PROCALCITON  --   --   --  <0.10  WBC 5.9 5.7 4.7 5.3  LATICACIDVEN  --   --   --  1.5    Microbiology Recent Results (from the past 240 hour(s))  Culture, blood (routine x 2)     Status: None   Collection Time: 04/08/23  7:46 PM   Specimen: BLOOD  Result Value Ref Range Status   Specimen Description BLOOD RIGHT ANTECUBITAL  Final   Special Requests   Final    BOTTLES DRAWN AEROBIC AND ANAEROBIC Blood Culture adequate volume   Culture   Final    NO GROWTH 5 DAYS Performed at [facility], [address] [place], [place] [address]    Report Status 04/13/2023 FINAL  Final  Resp panel by RT-PCR (RSV, Flu A&B, Covid) Urine, Clean Catch     Status: None   Collection Time: 04/08/23  7:46 PM   Specimen: Urine, Clean Catch; Nasal Swab  Result Value Ref Range Status   SARS Coronavirus 2 by RT PCR NEGATIVE NEGATIVE Final    Comment: (NOTE) SARS-CoV-2 target nucleic acids are NOT DETECTED.  The SARS-CoV-2 RNA is generally detectable in upper respiratory specimens during the acute phase of infection. The lowest concentration of SARS-CoV-2 viral copies this assay can detect is 138 copies/mL. A negative result does not preclude SARS-Cov-2 infection and should not be used as the sole basis for treatment or other patient management decisions. A negative result may occur with  improper specimen collection/handling, submission of specimen other than nasopharyngeal swab, presence of viral mutation(s) within the areas targeted by this assay, and inadequate number of viral copies(<138 copies/mL). A negative result must be combined  with clinical observations, patient history, and epidemiological information. The expected result is Negative.  Fact Sheet for Patients:  BloggerCourse.com  Fact Sheet for Healthcare Providers:  SeriousBroker.it  This test is no t yet approved or cleared by the [place] FDA and  has been authorized for detection and/or diagnosis of SARS-CoV-2 by FDA under an Emergency Use Authorization (EUA). This EUA will remain  in effect (meaning this test can be used) for the duration of the COVID-19 declaration under Section 564(b)(1) of the Act, 21 U.S.C.section 360bbb-3(b)(1), unless the authorization is terminated  or revoked sooner.       Influenza A by PCR NEGATIVE NEGATIVE Final   Influenza B by PCR NEGATIVE NEGATIVE Final    Comment: (NOTE)  The Xpert Xpress SARS-CoV-2/FLU/RSV plus assay is intended as an aid in the diagnosis of influenza from Nasopharyngeal swab specimens and should not be used as a sole basis for treatment. Nasal washings and aspirates are unacceptable for Xpert Xpress SARS-CoV-2/FLU/RSV testing.  Fact Sheet for Patients: BloggerCourse.com  Fact Sheet for Healthcare Providers: SeriousBroker.it  This test is not yet approved or cleared by the [place] FDA and has been authorized for detection and/or diagnosis of SARS-CoV-2 by FDA under an Emergency Use Authorization (EUA). This EUA will remain in effect (meaning this test can be used) for the duration of the COVID-19 declaration under Section 564(b)(1) of the Act, 21 U.S.C. section 360bbb-3(b)(1), unless the authorization is terminated or revoked.     Resp Syncytial Virus by PCR NEGATIVE NEGATIVE Final    Comment: (NOTE) Fact Sheet for Patients: BloggerCourse.com  Fact Sheet for Healthcare Providers: SeriousBroker.it  This test is not yet approved  or cleared by the [place] FDA and has been authorized for detection and/or diagnosis of SARS-CoV-2 by FDA under an Emergency Use Authorization (EUA). This EUA will remain in effect (meaning this test can be used) for the duration of the COVID-19 declaration under Section 564(b)(1) of the Act, 21 U.S.C. section 360bbb-3(b)(1), unless the authorization is terminated or revoked.  Performed at [facility], [address] [place], [place] [address]   Culture, blood (routine x 2)     Status: None   Collection Time: 04/08/23  8:11 PM   Specimen: BLOOD  Result Value Ref Range Status   Specimen Description BLOOD BLOOD LEFT ARM  Final   Special Requests   Final    BOTTLES DRAWN AEROBIC AND ANAEROBIC Blood Culture results may not be optimal due to an excessive volume of blood received in culture bottles   Culture   Final    NO GROWTH 5 DAYS Performed at [facility], [address], [place], [place] [address]    Report Status 04/13/2023 FINAL  Final    Procedures and diagnostic studies:  [facility] Chest Port 1 View  Result Date: 04/18/2023 CLINICAL DATA:  52 year old male with fever. EXAM: PORTABLE CHEST 1 VIEW COMPARISON:  Portable chest 04/08/2023 and earlier. FINDINGS: Portable AP upright view at 1036 hours. Similar mild patient rotation to the right. Chronic mild asymmetrically decreased right lung volume. Mediastinal contours remain within normal limits. Visualized tracheal air column is within normal limits. Allowing for portable technique the lungs are clear. No acute osseous abnormality identified. Negative visible bowel gas. IMPRESSION: No acute cardiopulmonary abnormality. Electronically Signed   By: Odessa Fleming M.D.   On: 04/18/2023 10:41               LOS: 10 days   Candace Begue  [facility]   Pager on [email or web address]. If 7PM-7AM, please contact night-coverage at [email or web address]     04/18/2023, 12:09 PM

## 2023-04-18 NOTE — Progress Notes (Addendum)
Pharmacy Antibiotic Note  Jared Mayer is a 52 y.o. male w/ PMH of intellectual disability, schizoaffective disorder, Lewy body dementia, dyslipidemia, drug-induced parkinsonism, hypothyroidism admitted on 04/08/2023 with sepsis.  Pharmacy has been consulted for vancomycin and Zosyn dosing.  Plan:  1) start Zosyn 3.375 grams IV every 8 hours (EI) ---follow renal function for needed dose adjustments  2) start vancomycin 1500 mg IV x 1 then 1250 mg IV every 12 hours Goal AUC 400-550. Expected AUC: 515.6 SCr used: 0.84 mg/dL Ke 1.610R-6, E4/5 8h  Weight: 77.4 kg (170 lb 10.2 oz)  Temp (24hrs), Avg:99.2 F (37.3 C), Min:98.4 F (36.9 C), Max:101.4 F (38.6 C)  Recent Labs  Lab 04/12/23 0226 04/13/23 0532 04/14/23 0611 04/18/23 1034  WBC 5.9 5.7 4.7 5.3  CREATININE 0.71 0.69 0.87 0.84  LATICACIDVEN  --   --   --  1.5    Estimated Creatinine Clearance: 99.5 mL/min (by C-G formula based on SCr of 0.84 mg/dL).    No Known Allergies  Antimicrobials this admission: 12/08 vancomycin >>  12/08 Zosyn >>   Microbiology results: 11/28 BCx: NG x 5 days 12/08 BCx: pending 12/08 MRSA PCR: pending  Thank you for allowing pharmacy to be a part of this patient's care.  Lowella Bandy 04/18/2023 11:13 AM

## 2023-04-18 NOTE — Plan of Care (Signed)
  Problem: Clinical Measurements: Goal: Diagnostic test results will improve Outcome: Progressing   Problem: Activity: Goal: Risk for activity intolerance will decrease Outcome: Progressing   Problem: Elimination: Goal: Will not experience complications related to bowel motility Outcome: Progressing   Problem: Pain Management: Goal: General experience of comfort will improve Outcome: Progressing

## 2023-04-19 ENCOUNTER — Other Ambulatory Visit: Payer: Medicare HMO

## 2023-04-19 ENCOUNTER — Inpatient Hospital Stay
Admit: 2023-04-19 | Discharge: 2023-04-19 | Disposition: A | Discharge status: Home / Self Care | Payer: Medicare HMO | Attending: Internal Medicine | Admitting: Internal Medicine

## 2023-04-19 DIAGNOSIS — M6282 Rhabdomyolysis: Secondary | ICD-10-CM

## 2023-04-19 DIAGNOSIS — R509 Fever, unspecified: Secondary | ICD-10-CM

## 2023-04-19 DIAGNOSIS — R7881 Bacteremia: Secondary | ICD-10-CM

## 2023-04-19 DIAGNOSIS — G3183 Dementia with Lewy bodies: Secondary | ICD-10-CM

## 2023-04-19 DIAGNOSIS — G9340 Encephalopathy, unspecified: Secondary | ICD-10-CM | POA: Diagnosis not present

## 2023-04-19 DIAGNOSIS — R4182 Altered mental status, unspecified: Secondary | ICD-10-CM | POA: Diagnosis not present

## 2023-04-19 DIAGNOSIS — B957 Other staphylococcus as the cause of diseases classified elsewhere: Secondary | ICD-10-CM

## 2023-04-19 DIAGNOSIS — R410 Disorientation, unspecified: Secondary | ICD-10-CM | POA: Diagnosis not present

## 2023-04-19 DIAGNOSIS — A419 Sepsis, unspecified organism: Secondary | ICD-10-CM | POA: Diagnosis not present

## 2023-04-19 LAB — BLOOD CULTURE ID PANEL (REFLEXED) - BCID2

## 2023-04-19 LAB — RESPIRATORY PANEL BY PCR

## 2023-04-19 LAB — ECHOCARDIOGRAM COMPLETE
AR max vel: 2.55 cm2
AV Area VTI: 2.36 cm2
AV Area mean vel: 2.81 cm2
AV Mean grad: 3 mm[Hg]
AV Peak grad: 6.5 mm[Hg]
Ao pk vel: 1.27 m/s
Area-P 1/2: 6.65 cm2
MV VTI: 2.89 cm2
S' Lateral: 1.6 cm
Weight: 2730.18 [oz_av]

## 2023-04-19 LAB — SARS CORONAVIRUS 2 BY RT PCR: SARS Coronavirus 2 by RT PCR: NEGATIVE

## 2023-04-19 LAB — GLUCOSE, CAPILLARY: Glucose-Capillary: 100 mg/dL — ABNORMAL HIGH (ref 70–99)

## 2023-04-19 NOTE — Plan of Care (Signed)

## 2023-04-19 NOTE — Consult Note (Signed)
NAME: Jared Mayer  DOB: [birth year]  MRN: [id number]  Date/Time: 04/19/2023 11:09 AM  REQUESTING PROVIDER; Dr>Ayiku Subjective:  REASON FOR CONSULT: fever/staph epidermidis bacteremia ?No history from patient  Jared Mayer is a 52 y.o. with a history of schizoaffective disorder, drug induced parkinson,intellectual disability, lewy body dementia  was brought from Group home by POV on 04/03/23 for aggressive behavior. He was assessed by psychiatrist and cleared . He was boarded in the ED until a group home accepted him .HE was discharged to Group home on 11/27. He was brought in the next day 04/08/23 with altered mental status, incontinence , falls   04/08/23  BP 129/70  Temp 100.4 F (38 C) !  Pulse Rate 111 !  Resp 22 !  SpO2 94 %    Latest Reference Range & Units 04/08/23  WBC 4.0 - 10.5 K/uL 5.6  Hemoglobin 13.0 - 17.0 g/dL 40.9  HCT 81.1 - 91.4 % 45.3  Platelets 150 - 400 K/uL 302  Creatinine 0.61 - 1.24 mg/dL 7.82  Blood culture sent CXR no infiltrate He had Rhabdomyolysis  He was started on IV vanco/cefepime/flagyl. He received for 72 hrs and they were stopped as cultures came back good. HE developed a fever on 12/8 and had some altered metal status- Blood culture sent. He was started on IV vanco and zosyn  I am asked to see him for fever and blood culture 1 of 4 bottle having staph epi bacteremia    Past Medical History:  Diagnosis Date   Dementia (HCC)    Dyslipidemia    Hypothyroid    Manic behavior (HCC)    Mentally disabled    Schizoaffective disorder, bipolar type (HCC)     History reviewed. No pertinent surgical history.  Social History   Socioeconomic History   Marital status: Single    Spouse name: Not on file   Number of children: Not on file   Years of education: Not on file   Highest education level: Not on file  Occupational History   Not on file  Tobacco Use   Smoking status: Never   Smokeless tobacco: Never  Vaping Use   Vaping status: Never  Used  Substance and Sexual Activity   Alcohol use: Never   Drug use: Never   Sexual activity: Never  Other Topics Concern   Not on file  Social History Narrative   Not on file   Social Determinants of Health   Financial Resource Strain: Low Risk  (03/29/2018)   Overall Financial Resource Strain (CARDIA)    Difficulty of Paying Living Expenses: Not hard at all  Food Insecurity: No Food Insecurity (04/12/2023)   Hunger Vital Sign    Worried About Running Out of Food in the Last Year: Never true    Ran Out of Food in the Last Year: Never true  Transportation Needs: Patient Unable To Answer (04/12/2023)   PRAPARE - Transportation    Lack of Transportation (Medical): Patient unable to answer    Lack of Transportation (Non-Medical): Patient unable to answer  Physical Activity: Unknown (03/29/2018)   Exercise Vital Sign    Days of Exercise per Week: Patient declined    Minutes of Exercise per Session: Patient declined  Stress: No Stress Concern Present (03/29/2018)   [employer] of Occupational Health - Occupational Stress Questionnaire    Feeling of Stress : Only a little  Social Connections: Unknown (03/29/2018)   Social Connection and Isolation Panel [NHANES]    Frequency of Communication with  Friends and Family: Patient declined    Frequency of Social Gatherings with Friends and Family: Patient declined    Attends Religious Services: Patient declined    Database administrator or Organizations: Patient declined    Attends Banker Meetings: Patient declined    Marital Status: Patient declined  Intimate Partner Violence: Patient Unable To Answer (04/12/2023)   Humiliation, Afraid, Rape, and Kick questionnaire    Fear of Current or Ex-Partner: Patient unable to answer    Emotionally Abused: Patient unable to answer    Physically Abused: Patient unable to answer    Sexually Abused: Patient unable to answer    Family History  Problem Relation Age of Onset    Hypothyroidism Mother    Sarcoidosis Mother    No Known Allergies I? Current Facility-Administered Medications  Medication Dose Route Frequency Provider Last Rate Last Admin   acetaminophen (TYLENOL) tablet 650 mg  650 mg Oral Q6H PRN Mansy, Jan A, MD   650 mg at 04/09/23 0502   Or   acetaminophen (TYLENOL) suppository 650 mg  650 mg Rectal Q6H PRN Mansy, Jan A, MD   650 mg at 04/18/23 1047   chlorhexidine (PERIDEX) 0.12 % solution 7.5 mL  7.5 mL Mouth Rinse BID Mansy, Jan A, MD   7.5 mL at 04/19/23 0944   clonazePAM (KLONOPIN) tablet 1 mg  1 mg Oral BID PRN Mansy, Jan A, MD   1 mg at 04/11/23 1751   diclofenac Sodium (VOLTAREN) 1 % topical gel 1 Application  1 Application Topical BID PRN Mansy, Jan A, MD       divalproex (DEPAKOTE ER) 24 hr tablet 2,000 mg  2,000 mg Oral QHS Mansy, Jan A, MD   2,000 mg at 04/18/23 2149   docusate sodium (COLACE) capsule 100 mg  100 mg Oral BID Mansy, Jan A, MD   100 mg at 04/19/23 0944   enoxaparin (LOVENOX) injection 40 mg  40 mg Subcutaneous Q24H Mansy, Jan A, MD   40 mg at 04/19/23 0946   glycopyrrolate (ROBINUL) tablet 1 mg  1 mg Oral BID Mansy, Jan A, MD   1 mg at 04/19/23 0945   lactulose (CHRONULAC) 10 GM/15ML solution 20 g  20 g Oral BID Mansy, Jan A, MD   20 g at 04/19/23 0944   lamoTRIgine (LAMICTAL) tablet 100 mg  100 mg Oral Daily Mansy, Jan A, MD   100 mg at 04/19/23 0944   levothyroxine (SYNTHROID) tablet 125 mcg  125 mcg Oral Q0600 Mansy, Jan A, MD   125 mcg at 04/19/23 0520   loratadine (CLARITIN) tablet 10 mg  10 mg Oral Daily PRN Mansy, Jan A, MD       loxapine (LOXITANE) capsule 25 mg  25 mg Oral TID Mansy, Jan A, MD   25 mg at 04/19/23 0945   magnesium hydroxide (MILK OF MAGNESIA) suspension 30 mL  30 mL Oral Daily PRN Mansy, Jan A, MD       metoprolol tartrate (LOPRESSOR) injection 5 mg  5 mg Intravenous Q8H PRN Charise Killian, MD   5 mg at 04/18/23 2130   midodrine (PROAMATINE) tablet 2.5 mg  2.5 mg Oral BID WC Mansy, Jan A, MD    2.5 mg at 04/19/23 0944   mirabegron ER (MYRBETRIQ) tablet 50 mg  50 mg Oral Daily Mansy, Jan A, MD   50 mg at 04/19/23 0945   nitroGLYCERIN (NITROSTAT) SL tablet 0.4 mg  0.4 mg Sublingual Q5 min  PRN Mansy, Vernetta Honey, MD       ondansetron [facility]) tablet 4 mg  4 mg Oral Q6H PRN Mansy, Jan A, MD       Or   ondansetron [facility]) injection 4 mg  4 mg Intravenous Q6H PRN Mansy, Jan A, MD       pantoprazole (PROTONIX) EC tablet 40 mg  40 mg Oral Daily Mansy, Jan A, MD   40 mg at 04/19/23 0944   PARoxetine (PAXIL) tablet 40 mg  40 mg Oral Daily Mansy, Jan A, MD   40 mg at 04/19/23 0946   piperacillin-tazobactam (ZOSYN) IVPB 3.375 g  3.375 g Intravenous Rachelle Hora, MD 12.5 mL/hr at 04/19/23 0943 3.375 g at 04/19/23 0943   polyethylene glycol (MIRALAX / GLYCOLAX) packet 34 g  34 g Oral Daily Otelia Sergeant, RPH   34 g at 04/19/23 0946   simethicone (MYLICON) chewable tablet 80 mg  80 mg Oral Q6H PRN Mansy, Jan A, MD       topiramate (TOPAMAX) tablet 75 mg  75 mg Oral BID Mansy, Jan A, MD   75 mg at 04/19/23 0945   traZODone (DESYREL) tablet 25 mg  25 mg Oral QHS PRN Mansy, Jan A, MD   25 mg at 04/11/23 2235   valbenazine [facility]) capsule 80 mg  80 mg Oral QHS Mansy, Jan A, MD   80 mg at 04/18/23 2149   vancomycin (VANCOREADY) IVPB 1250 mg/250 mL  1,250 mg Intravenous Q12H Lowella Bandy, RPH 166.7 mL/hr at 04/19/23 0942 1,250 mg at 04/19/23 1610   Vitamin D (Ergocalciferol) (DRISDOL) 1.25 MG (50000 UNIT) capsule 50,000 Units  50,000 Units Oral Q7 days Lurene Shadow, MD   50,000 Units at 04/15/23 9604     Abtx:  Anti-infectives (From admission, onward)    Start     Dose/Rate Route Frequency Ordered Stop   04/18/23 2200  vancomycin (VANCOREADY) IVPB 1250 mg/250 mL        1,250 mg 166.7 mL/hr over 90 Minutes Intravenous Every 12 hours 04/18/23 1126     04/18/23 1600  piperacillin-tazobactam (ZOSYN) IVPB 3.375 g        3.375 g 12.5 mL/hr over 240 Minutes Intravenous Every 8 hours 04/18/23 1507      04/18/23 1400  ceFEPIme (MAXIPIME) 2 g in sodium chloride 0.9 % 100 mL IVPB  Status:  Discontinued        2 g 200 mL/hr over 30 Minutes Intravenous Every 8 hours 04/18/23 1126 04/18/23 1128   04/18/23 1400  piperacillin-tazobactam (ZOSYN) IVPB 3.375 g  Status:  Discontinued        3.375 g 12.5 mL/hr over 240 Minutes Intravenous Every 8 hours 04/18/23 1128 04/18/23 1507   04/18/23 1300  vancomycin (VANCOREADY) IVPB 1500 mg/300 mL  Status:  Discontinued        1,500 mg 150 mL/hr over 120 Minutes Intravenous  Once 04/18/23 1126 04/18/23 1152   04/18/23 1300  Vancomycin (VANCOCIN) 1,500 mg in sodium chloride 0.9 % 500 mL IVPB        1,500 mg 250 mL/hr over 120 Minutes Intravenous  Once 04/18/23 1152 04/19/23 0700   04/11/23 1800  doxycycline (VIBRA-TABS) tablet 100 mg  Status:  Discontinued        100 mg Oral Every 12 hours 04/11/23 0734 04/11/23 0828   04/10/23 2000  vancomycin (VANCOREADY) IVPB 1750 mg/350 mL  Status:  Discontinued       Placed in "Followed by" Linked Group  1,750 mg 175 mL/hr over 120 Minutes Intravenous Every 24 hours 04/09/23 0015 04/09/23 1445   04/09/23 2200  cefTRIAXone (ROCEPHIN) 2 g in sodium chloride 0.9 % 100 mL IVPB  Status:  Discontinued        2 g 200 mL/hr over 30 Minutes Intravenous Every 24 hours 04/09/23 1445 04/11/23 0828   04/09/23 1500  doxycycline (VIBRAMYCIN) 100 mg in dextrose 5 % 250 mL IVPB  Status:  Discontinued        100 mg 125 mL/hr over 120 Minutes Intravenous Every 12 hours 04/09/23 1445 04/11/23 0734   04/09/23 1000  metroNIDAZOLE (FLAGYL) IVPB 500 mg  Status:  Discontinued        500 mg 100 mL/hr over 60 Minutes Intravenous Every 12 hours 04/08/23 2309 04/10/23 1436   04/09/23 0500  ceFEPIme (MAXIPIME) 2 g in sodium chloride 0.9 % 100 mL IVPB  Status:  Discontinued        2 g 200 mL/hr over 30 Minutes Intravenous Every 8 hours 04/09/23 0009 04/09/23 1445   04/09/23 0200  vancomycin (VANCOCIN) IVPB 1000 mg/200 mL premix       Placed  in "Followed by" Linked Group   1,000 mg 200 mL/hr over 60 Minutes Intravenous  Once 04/09/23 0015 04/09/23 0419   04/08/23 2315  ceFEPIme (MAXIPIME) 2 g in sodium chloride 0.9 % 100 mL IVPB  Status:  Discontinued        2 g 200 mL/hr over 30 Minutes Intravenous  Once 04/08/23 2309 04/08/23 2320   04/08/23 2315  vancomycin (VANCOCIN) IVPB 1000 mg/200 mL premix  Status:  Discontinued        1,000 mg 200 mL/hr over 60 Minutes Intravenous  Once 04/08/23 2309 04/08/23 2321   04/08/23 1915  ceFEPIme (MAXIPIME) 2 g in sodium chloride 0.9 % 100 mL IVPB        2 g 200 mL/hr over 30 Minutes Intravenous  Once 04/08/23 1900 04/08/23 2116   04/08/23 1915  metroNIDAZOLE (FLAGYL) IVPB 500 mg        500 mg 100 mL/hr over 60 Minutes Intravenous  Once 04/08/23 1900 04/08/23 2220   04/08/23 1915  vancomycin (VANCOCIN) IVPB 1000 mg/200 mL premix        1,000 mg 200 mL/hr over 60 Minutes Intravenous  Once 04/08/23 1900 04/08/23 2116       REVIEW OF SYSTEMS:  NA: Objective:  VITALS:  BP 124/75 (BP Location: Left Arm)   Pulse (!) 110   Temp 99.9 F (37.7 C) (Oral)   Resp 16   Wt 77.4 kg   SpO2 95%   BMI 25.95 kg/m   PHYSICAL EXAM:  General: somnolent, not responsive Head /ck to the right Eyes: Conjunctivae clear, anicteric sclerae. Pupils are equal ENTcannot examine Lower lip has ulceration Lungs: b/l air entry Heart: Tachycardia Abdomen: Soft, non-tender,not distended. Bowel sounds normal. No masses Extremities: atraumatic, no cyanosis. No edema. No clubbing Skin: No rashes or lesions. Or bruising Lymph: Cervical, supraclavicular arm and leg flexion contracture Some rigidity of muscles  Pertinent Labs Lab Results CBC    Component Value Date/Time   WBC 5.3 04/18/2023 1034   RBC 4.76 04/18/2023 1034   HGB 15.6 04/18/2023 1034   HCT 45.4 04/18/2023 1034   PLT 306 04/18/2023 1034   MCV 95.4 04/18/2023 1034   MCH 32.8 04/18/2023 1034   MCHC 34.4 04/18/2023 1034   RDW 14.7  04/18/2023 1034   LYMPHSABS 0.7 04/18/2023 1034   MONOABS 0.3  04/18/2023 1034   EOSABS 0.0 04/18/2023 1034   BASOSABS 0.0 04/18/2023 1034       Latest Ref Rng & Units 04/18/2023   10:34 AM 04/14/2023    6:11 AM 04/13/2023    5:32 AM  CMP  Glucose 70 - 99 mg/dL 409  89  90   BUN 6 - 20 mg/dL 32  23  17   Creatinine 0.61 - 1.24 mg/dL 8.11  9.14  7.82   Sodium 135 - 145 mmol/L 140  141  140   Potassium 3.5 - 5.1 mmol/L 3.9  3.5  3.8   Chloride 98 - 111 mmol/L 105  105  107   CO2 22 - 32 mmol/L 23  29  25    Calcium 8.9 - 10.3 mg/dL 8.9  8.8  9.1   Total Protein 6.5 - 8.1 g/dL 7.2     Total Bilirubin <1.2 mg/dL 0.6     Alkaline Phos 38 - 126 U/L 75     AST 15 - 41 U/L 65     ALT 0 - 44 U/L 45         Microbiology: Recent Results (from the past 240 hour(s))  Culture, blood (Routine X 2) w Reflex to ID Panel     Status: None (Preliminary result)   Collection Time: 04/18/23 10:34 AM   Specimen: BLOOD  Result Value Ref Range Status   Specimen Description BLOOD BLOOD RIGHT ARM  Final   Special Requests   Final    BOTTLES DRAWN AEROBIC AND ANAEROBIC Blood Culture results may not be optimal due to an excessive volume of blood received in culture bottles   Culture  Setup Time   Final    GRAM POSITIVE COCCI ANAEROBIC BOTTLE ONLY CRITICAL RESULT CALLED TO, READ BACK BY AND VERIFIED WITH: JASON ROBBINS AT 04/19/23 0448 BY AB Performed at [facility], [address] [place], [place] [address]    Culture GRAM POSITIVE COCCI  Final   Report Status PENDING  Incomplete  Blood Culture ID Panel (Reflexed)     Status: Abnormal   Collection Time: 04/18/23 10:34 AM  Result Value Ref Range Status   Enterococcus faecalis NOT DETECTED NOT DETECTED Final   Enterococcus Faecium NOT DETECTED NOT DETECTED Final   Listeria monocytogenes NOT DETECTED NOT DETECTED Final   Staphylococcus species DETECTED (A) NOT DETECTED Final    Comment: CRITICAL RESULT CALLED TO, READ BACK BY AND VERIFIED  WITH: JASON ROBBINS AT 04/19/23 0448 BY AB    Staphylococcus aureus (BCID) NOT DETECTED NOT DETECTED Final   Staphylococcus epidermidis DETECTED (A) NOT DETECTED Final    Comment: Methicillin (oxacillin) resistant coagulase negative staphylococcus. Possible blood culture contaminant (unless isolated from more than one blood culture draw or clinical case suggests pathogenicity). No antibiotic treatment is indicated for blood  culture contaminants. CRITICAL RESULT CALLED TO, READ BACK BY AND VERIFIED WITH: JASON ROBBINS AT 04/19/23 0448 BY AB    Staphylococcus lugdunensis NOT DETECTED NOT DETECTED Final   Streptococcus species NOT DETECTED NOT DETECTED Final   Streptococcus agalactiae NOT DETECTED NOT DETECTED Final   Streptococcus pneumoniae NOT DETECTED NOT DETECTED Final   Streptococcus pyogenes NOT DETECTED NOT DETECTED Final   A.calcoaceticus-baumannii NOT DETECTED NOT DETECTED Final   Bacteroides fragilis NOT DETECTED NOT DETECTED Final   Enterobacterales NOT DETECTED NOT DETECTED Final   Enterobacter cloacae complex NOT DETECTED NOT DETECTED Final   Escherichia coli NOT DETECTED NOT DETECTED Final   Klebsiella aerogenes NOT DETECTED  NOT DETECTED Final   Klebsiella oxytoca NOT DETECTED NOT DETECTED Final   Klebsiella pneumoniae NOT DETECTED NOT DETECTED Final   Proteus species NOT DETECTED NOT DETECTED Final   Salmonella species NOT DETECTED NOT DETECTED Final   Serratia marcescens NOT DETECTED NOT DETECTED Final   Haemophilus influenzae NOT DETECTED NOT DETECTED Final   Neisseria meningitidis NOT DETECTED NOT DETECTED Final   Pseudomonas aeruginosa NOT DETECTED NOT DETECTED Final   Stenotrophomonas maltophilia NOT DETECTED NOT DETECTED Final   Candida albicans NOT DETECTED NOT DETECTED Final   Candida auris NOT DETECTED NOT DETECTED Final   Candida glabrata NOT DETECTED NOT DETECTED Final   Candida krusei NOT DETECTED NOT DETECTED Final   Candida parapsilosis NOT DETECTED NOT  DETECTED Final   Candida tropicalis NOT DETECTED NOT DETECTED Final   Cryptococcus neoformans/gattii NOT DETECTED NOT DETECTED Final   Methicillin resistance mecA/C DETECTED (A) NOT DETECTED Final    Comment: CRITICAL RESULT CALLED TO, READ BACK BY AND VERIFIED WITH: JASON ROBBINS AT 04/19/23 0448 BY AB Performed at [facility], [address] [place], [place] [address]   Culture, blood (Routine X 2) w Reflex to ID Panel     Status: None (Preliminary result)   Collection Time: 04/18/23 10:35 AM   Specimen: BLOOD  Result Value Ref Range Status   Specimen Description BLOOD BLOOD RIGHT HAND  Final   Special Requests   Final    BOTTLES DRAWN AEROBIC AND ANAEROBIC Blood Culture results may not be optimal due to an excessive volume of blood received in culture bottles   Culture   Final    NO GROWTH < 24 HOURS Performed at [facility], [address] [place], [place] [address]    Report Status PENDING  Incomplete  MRSA Next Gen by PCR, Nasal     Status: None   Collection Time: 04/18/23  1:12 PM   Specimen: Urine, Clean Catch; Nasal Swab  Result Value Ref Range Status   MRSA by PCR Next Gen NOT DETECTED NOT DETECTED Final    Comment: (NOTE) The GeneXpert MRSA Assay (FDA approved for NASAL specimens only), is one component of a comprehensive MRSA colonization surveillance program. It is not intended to diagnose MRSA infection nor to guide or monitor treatment for MRSA infections. Test performance is not FDA approved in patients less than 61 years old. Performed at [facility], [address] [place], [place] [address]     IMAGING RESULTS: Cxr no infiltrate CT abd/pelvis/chest - No acute pathology  I have personally reviewed the films ? Impression/Recommendation ? Pt with Lewy body dementia, schizoaffective disorder,has been in hospital since 11/28- fever on admission with AMS which resolved and then fever again on 04/18/23  Altered mental status  /Fever/ rhabdomyloysis Staph epidermidis in 1 of 4 is a contaminant - so will not treat There is no pneumonia or UTI Concern would be drug induced- ( NMS) Loxapine  Also on multiple other drugs lamictal, topamax, depakote Glycopyrrolate can also contribute to the above symptoms Will Dc vanco May DC zosyn if culture remains neg     ________________________________________________ Discussed with  requesting provider

## 2023-04-19 NOTE — Progress Notes (Signed)
Progress Note    Jared Mayer  MVH:[id number] DOB: 1970-06-25  DOA: 04/08/2023 PCP: Almetta Lovely, [facility]      Brief Narrative:    Medical records reviewed and are as summarized below:  Jared Mayer is a 52 y.o. male with medical history significant for intellectual disability, schizoaffective disorder, Lewy body dementia, dyslipidemia, drug-induced parkinsonism, hypothyroidism, who presented today for hospital because of altered mental status, disorientation, repetitive speech pattern, erratic behavior and recurrent falls.  Reportedly, he had been more aggressive at the group home than usual.      Assessment/Plan:   Principal Problem:   Altered mental status Active Problems:   Rhabdomyolysis   Schizoaffective disorder, bipolar type (HCC)   Dyslipidemia   Hypothyroidism   Anxiety and depression   Sepsis without acute organ dysfunction (HCC)   Multiple falls    Body mass index is 25.95 kg/m.   Acute metabolic encephalopathy on underlying Lewy body dementia: He still lethargic   Sepsis, fever (rectal temp 101.4 F on 04/18/2023): SARS coronavirus test was negative.  Respiratory viral panel is pending.  CT chest, abdomen and pelvis did not reveal any source of infection.  Urinalysis was unremarkable.  1 out of 4 blood culture bottles showed methicillin-resistant Staph epidermidis.  Suspect this may be a contaminant.  However, given recent fever, Dr. Rivka Safer (ID specialist) has been consulted to weigh in. Patient has no pacemaker or prosthetic valve in place.  No prior 2D echo on chart.  2D echo has been ordered for further evaluation. Continue empiric IV antibiotics for now.   Rhabdomyolysis: Improved.  CK down from 1,113  to 1,055  to 691.  Statin on hold.  Monitor CK levels.  TSH was normal (2.575) Chart review showed that CK was 4,622 on 03/11/2021.   Dysphagia: Continue dysphagia 3 diet as able   Vitamin D deficiency: Vitamin D level on 04/07/2023  from an outside lab was 11.1.  Started on high-dose weekly vitamin D supplement on 04/15/2023.    Schizoaffective disorder, erratic behavior, depression, anxiety: Continue psychotropics.  Patient does not meet criteria for admission to inpatient behavioral health unit.  Unfortunately, group home will not take him back.  Follow-up with social worker to assist with disposition.   Multiple falls: Plan to discharge to long-term care facility.      Diet Order             DIET DYS 3 Room service appropriate? Yes with Assist; Fluid consistency: Thin  Diet effective now                            Consultants: Psychiatrist  Procedures: None    Medications:    chlorhexidine  7.5 mL Mouth Rinse BID   divalproex  2,000 mg Oral QHS   docusate sodium  100 mg Oral BID   enoxaparin (LOVENOX) injection  40 mg Subcutaneous Q24H   glycopyrrolate  1 mg Oral BID   lactulose  20 g Oral BID   lamoTRIgine  100 mg Oral Daily   levothyroxine  125 mcg Oral Q0600   loxapine  25 mg Oral TID   midodrine  2.5 mg Oral BID WC   mirabegron ER  50 mg Oral Daily   pantoprazole  40 mg Oral Daily   PARoxetine  40 mg Oral Daily   polyethylene glycol  34 g Oral Daily   topiramate  75 mg Oral BID   valbenazine  80  mg Oral QHS   Vitamin D (Ergocalciferol)  50,000 Units Oral Q7 days   Continuous Infusions:  piperacillin-tazobactam (ZOSYN)  IV 3.375 g (04/19/23 0943)     Anti-infectives (From admission, onward)    Start     Dose/Rate Route Frequency Ordered Stop   04/18/23 2200  vancomycin (VANCOREADY) IVPB 1250 mg/250 mL  Status:  Discontinued        1,250 mg 166.7 mL/hr over 90 Minutes Intravenous Every 12 hours 04/18/23 1126 04/19/23 1557   04/18/23 1600  piperacillin-tazobactam (ZOSYN) IVPB 3.375 g        3.375 g 12.5 mL/hr over 240 Minutes Intravenous Every 8 hours 04/18/23 1507     04/18/23 1400  ceFEPIme (MAXIPIME) 2 g in sodium chloride 0.9 % 100 mL IVPB  Status:  Discontinued         2 g 200 mL/hr over 30 Minutes Intravenous Every 8 hours 04/18/23 1126 04/18/23 1128   04/18/23 1400  piperacillin-tazobactam (ZOSYN) IVPB 3.375 g  Status:  Discontinued        3.375 g 12.5 mL/hr over 240 Minutes Intravenous Every 8 hours 04/18/23 1128 04/18/23 1507   04/18/23 1300  vancomycin (VANCOREADY) IVPB 1500 mg/300 mL  Status:  Discontinued        1,500 mg 150 mL/hr over 120 Minutes Intravenous  Once 04/18/23 1126 04/18/23 1152   04/18/23 1300  Vancomycin (VANCOCIN) 1,500 mg in sodium chloride 0.9 % 500 mL IVPB        1,500 mg 250 mL/hr over 120 Minutes Intravenous  Once 04/18/23 1152 04/19/23 0700   04/11/23 1800  doxycycline (VIBRA-TABS) tablet 100 mg  Status:  Discontinued        100 mg Oral Every 12 hours 04/11/23 0734 04/11/23 0828   04/10/23 2000  vancomycin (VANCOREADY) IVPB 1750 mg/350 mL  Status:  Discontinued       Placed in "Followed by" Linked Group   1,750 mg 175 mL/hr over 120 Minutes Intravenous Every 24 hours 04/09/23 0015 04/09/23 1445   04/09/23 2200  cefTRIAXone (ROCEPHIN) 2 g in sodium chloride 0.9 % 100 mL IVPB  Status:  Discontinued        2 g 200 mL/hr over 30 Minutes Intravenous Every 24 hours 04/09/23 1445 04/11/23 0828   04/09/23 1500  doxycycline (VIBRAMYCIN) 100 mg in dextrose 5 % 250 mL IVPB  Status:  Discontinued        100 mg 125 mL/hr over 120 Minutes Intravenous Every 12 hours 04/09/23 1445 04/11/23 0734   04/09/23 1000  metroNIDAZOLE (FLAGYL) IVPB 500 mg  Status:  Discontinued        500 mg 100 mL/hr over 60 Minutes Intravenous Every 12 hours 04/08/23 2309 04/10/23 1436   04/09/23 0500  ceFEPIme (MAXIPIME) 2 g in sodium chloride 0.9 % 100 mL IVPB  Status:  Discontinued        2 g 200 mL/hr over 30 Minutes Intravenous Every 8 hours 04/09/23 0009 04/09/23 1445   04/09/23 0200  vancomycin (VANCOCIN) IVPB 1000 mg/200 mL premix       Placed in "Followed by" Linked Group   1,000 mg 200 mL/hr over 60 Minutes Intravenous  Once 04/09/23 0015  04/09/23 0419   04/08/23 2315  ceFEPIme (MAXIPIME) 2 g in sodium chloride 0.9 % 100 mL IVPB  Status:  Discontinued        2 g 200 mL/hr over 30 Minutes Intravenous  Once 04/08/23 2309 04/08/23 2320   04/08/23 2315  vancomycin (VANCOCIN)  IVPB 1000 mg/200 mL premix  Status:  Discontinued        1,000 mg 200 mL/hr over 60 Minutes Intravenous  Once 04/08/23 2309 04/08/23 2321   04/08/23 1915  ceFEPIme (MAXIPIME) 2 g in sodium chloride 0.9 % 100 mL IVPB        2 g 200 mL/hr over 30 Minutes Intravenous  Once 04/08/23 1900 04/08/23 2116   04/08/23 1915  metroNIDAZOLE (FLAGYL) IVPB 500 mg        500 mg 100 mL/hr over 60 Minutes Intravenous  Once 04/08/23 1900 04/08/23 2220   04/08/23 1915  vancomycin (VANCOCIN) IVPB 1000 mg/200 mL premix        1,000 mg 200 mL/hr over 60 Minutes Intravenous  Once 04/08/23 1900 04/08/23 2116              Family Communication/Anticipated D/C date and plan/Code Status   DVT prophylaxis: enoxaparin (LOVENOX) injection 40 mg Start: 04/09/23 1000     Code Status: Full Code  Family Communication: None Disposition Plan: Plan to discharge to long-term care facility   Status is: Inpatient Remains inpatient appropriate because: Awaiting placement       Subjective:   Interval events noted.  Patient is still lethargic and not as responsive as he was in the past few days.  He is unable to provide any history.  Objective:    Vitals:   04/18/23 2336 04/19/23 0306 04/19/23 0345 04/19/23 0805  BP: 101/64 (!) 140/81 123/68 124/75  Pulse: 92 80 (!) 113 (!) 110  Resp: 18 16 16    Temp: 98.9 F (37.2 C) 98.2 F (36.8 C) 98.7 F (37.1 C) 99.9 F (37.7 C)  TempSrc: Oral Oral Oral Oral  SpO2: 95% 97% 95% 95%  Weight:       No data found.   Intake/Output Summary (Last 24 hours) at 04/19/2023 1612 Last data filed at 04/19/2023 0345 Gross per 24 hour  Intake 1506.5 ml  Output 800 ml  Net 706.5 ml    Filed Weights   04/08/23 2358  Weight: 77.4  kg    Exam:  GEN: NAD SKIN: Warm and dry EYES: No pallor or icterus ENT: MMM CV: RRR PULM: CTA B ABD: soft, ND, NT, +BS CNS: Lethargic and not following commands.  He is not repeating my words ike he usually does EXT: No edema or tenderness      Data Reviewed:   I have personally reviewed following labs and imaging studies:  Labs: Labs show the following:   Basic Metabolic Panel: Recent Labs  Lab 04/13/23 0532 04/14/23 0611 04/18/23 1034  NA 140 141 140  K 3.8 3.5 3.9  CL 107 105 105  CO2 25 29 23   GLUCOSE 90 89 134*  BUN 17 23* 32*  CREATININE 0.69 0.87 0.84  CALCIUM 9.1 8.8* 8.9   GFR Estimated Creatinine Clearance: 99.5 mL/min (by C-G formula based on SCr of 0.84 mg/dL). Liver Function Tests: Recent Labs  Lab 04/18/23 1034  AST 65*  ALT 45*  ALKPHOS 75  BILITOT 0.6  PROT 7.2  ALBUMIN 3.8    No results for input(s): "LIPASE", "AMYLASE" in the last 168 hours. No results for input(s): "AMMONIA" in the last 168 hours. Coagulation profile No results for input(s): "INR", "PROTIME" in the last 168 hours.   CBC: Recent Labs  Lab 04/13/23 0532 04/14/23 0611 04/18/23 1034  WBC 5.7 4.7 5.3  NEUTROABS  --   --  4.2  HGB 15.1 14.2 15.6  HCT 44.1 42.7 45.4  MCV 94.8 96.6 95.4  PLT 252 269 306   Cardiac Enzymes: Recent Labs  Lab 04/13/23 0532 04/14/23 0611 04/18/23 1028  CKTOTAL 1,113* 1,055* 691*   BNP (last 3 results) No results for input(s): "PROBNP" in the last 8760 hours. CBG: Recent Labs  Lab 04/14/23 0845 04/14/23 1148 04/19/23 1040  GLUCAP 92 109* 100*   D-Dimer: No results for input(s): "DDIMER" in the last 72 hours. Hgb A1c: No results for input(s): "HGBA1C" in the last 72 hours. Lipid Profile: No results for input(s): "CHOL", "HDL", "LDLCALC", "TRIG", "CHOLHDL", "LDLDIRECT" in the last 72 hours. Thyroid function studies: No results for input(s): "TSH", "T4TOTAL", "T3FREE", "THYROIDAB" in the last 72 hours.  Invalid  input(s): "FREET3"  Anemia work up: No results for input(s): "VITAMINB12", "FOLATE", "FERRITIN", "TIBC", "IRON", "RETICCTPCT" in the last 72 hours. Sepsis Labs: Recent Labs  Lab 04/13/23 0532 04/14/23 0611 04/18/23 1034 04/18/23 1310 04/18/23 1740  PROCALCITON  --   --  <0.10  --   --   WBC 5.7 4.7 5.3  --   --   LATICACIDVEN  --   --  1.5 3.4* 2.8*    Microbiology Recent Results (from the past 240 hour(s))  Culture, blood (Routine X 2) w Reflex to ID Panel     Status: None (Preliminary result)   Collection Time: 04/18/23 10:34 AM   Specimen: BLOOD  Result Value Ref Range Status   Specimen Description BLOOD BLOOD RIGHT ARM  Final   Special Requests   Final    BOTTLES DRAWN AEROBIC AND ANAEROBIC Blood Culture results may not be optimal due to an excessive volume of blood received in culture bottles   Culture  Setup Time   Final    GRAM POSITIVE COCCI ANAEROBIC BOTTLE ONLY CRITICAL RESULT CALLED TO, READ BACK BY AND VERIFIED WITH: JASON ROBBINS AT 04/19/23 0448 BY AB Performed at [facility], [address] [place], [place] [address]    Culture GRAM POSITIVE COCCI  Final   Report Status PENDING  Incomplete  Blood Culture ID Panel (Reflexed)     Status: Abnormal   Collection Time: 04/18/23 10:34 AM  Result Value Ref Range Status   Enterococcus faecalis NOT DETECTED NOT DETECTED Final   Enterococcus Faecium NOT DETECTED NOT DETECTED Final   Listeria monocytogenes NOT DETECTED NOT DETECTED Final   Staphylococcus species DETECTED (A) NOT DETECTED Final    Comment: CRITICAL RESULT CALLED TO, READ BACK BY AND VERIFIED WITH: JASON ROBBINS AT 04/19/23 0448 BY AB    Staphylococcus aureus (BCID) NOT DETECTED NOT DETECTED Final   Staphylococcus epidermidis DETECTED (A) NOT DETECTED Final    Comment: Methicillin (oxacillin) resistant coagulase negative staphylococcus. Possible blood culture contaminant (unless isolated from more than one blood culture draw or clinical case  suggests pathogenicity). No antibiotic treatment is indicated for blood  culture contaminants. CRITICAL RESULT CALLED TO, READ BACK BY AND VERIFIED WITH: JASON ROBBINS AT 04/19/23 0448 BY AB    Staphylococcus lugdunensis NOT DETECTED NOT DETECTED Final   Streptococcus species NOT DETECTED NOT DETECTED Final   Streptococcus agalactiae NOT DETECTED NOT DETECTED Final   Streptococcus pneumoniae NOT DETECTED NOT DETECTED Final   Streptococcus pyogenes NOT DETECTED NOT DETECTED Final   A.calcoaceticus-baumannii NOT DETECTED NOT DETECTED Final   Bacteroides fragilis NOT DETECTED NOT DETECTED Final   Enterobacterales NOT DETECTED NOT DETECTED Final   Enterobacter cloacae complex NOT DETECTED NOT DETECTED Final   Escherichia coli NOT DETECTED NOT DETECTED  Final   Klebsiella aerogenes NOT DETECTED NOT DETECTED Final   Klebsiella oxytoca NOT DETECTED NOT DETECTED Final   Klebsiella pneumoniae NOT DETECTED NOT DETECTED Final   Proteus species NOT DETECTED NOT DETECTED Final   Salmonella species NOT DETECTED NOT DETECTED Final   Serratia marcescens NOT DETECTED NOT DETECTED Final   Haemophilus influenzae NOT DETECTED NOT DETECTED Final   Neisseria meningitidis NOT DETECTED NOT DETECTED Final   Pseudomonas aeruginosa NOT DETECTED NOT DETECTED Final   Stenotrophomonas maltophilia NOT DETECTED NOT DETECTED Final   Candida albicans NOT DETECTED NOT DETECTED Final   Candida auris NOT DETECTED NOT DETECTED Final   Candida glabrata NOT DETECTED NOT DETECTED Final   Candida krusei NOT DETECTED NOT DETECTED Final   Candida parapsilosis NOT DETECTED NOT DETECTED Final   Candida tropicalis NOT DETECTED NOT DETECTED Final   Cryptococcus neoformans/gattii NOT DETECTED NOT DETECTED Final   Methicillin resistance mecA/C DETECTED (A) NOT DETECTED Final    Comment: CRITICAL RESULT CALLED TO, READ BACK BY AND VERIFIED WITH: JASON ROBBINS AT 04/19/23 0448 BY AB Performed at [facility], [address] [place], [place] [address]   Culture, blood (Routine X 2) w Reflex to ID Panel     Status: None (Preliminary result)   Collection Time: 04/18/23 10:35 AM   Specimen: BLOOD  Result Value Ref Range Status   Specimen Description BLOOD BLOOD RIGHT HAND  Final   Special Requests   Final    BOTTLES DRAWN AEROBIC AND ANAEROBIC Blood Culture results may not be optimal due to an excessive volume of blood received in culture bottles   Culture   Final    NO GROWTH < 24 HOURS Performed at [facility], [address] [place], [place] [address]    Report Status PENDING  Incomplete  MRSA Next Gen by PCR, Nasal     Status: None   Collection Time: 04/18/23  1:12 PM   Specimen: Urine, Clean Catch; Nasal Swab  Result Value Ref Range Status   MRSA by PCR Next Gen NOT DETECTED NOT DETECTED Final    Comment: (NOTE) The GeneXpert MRSA Assay (FDA approved for NASAL specimens only), is one component of a comprehensive MRSA colonization surveillance program. It is not intended to diagnose MRSA infection nor to guide or monitor treatment for MRSA infections. Test performance is not FDA approved in patients less than 32 years old. Performed at [facility], [address] [place], [place] [address]   SARS Coronavirus 2 by RT PCR (hospital order, performed in [facility] hospital lab) *cepheid single result test* Anterior Nasal Swab     Status: None   Collection Time: 04/19/23  1:31 PM   Specimen: Anterior Nasal Swab  Result Value Ref Range Status   SARS Coronavirus 2 by RT PCR NEGATIVE NEGATIVE Final    Comment: (NOTE) SARS-CoV-2 target nucleic acids are NOT DETECTED.  The SARS-CoV-2 RNA is generally detectable in upper and lower respiratory specimens during the acute phase of infection. The lowest concentration of SARS-CoV-2 viral copies this assay can detect is 250 copies / mL. A negative result does not preclude SARS-CoV-2 infection and should not be used as the sole basis  for treatment or other patient management decisions.  A negative result may occur with improper specimen collection / handling, submission of specimen other than nasopharyngeal swab, presence of viral mutation(s) within the areas targeted by this assay, and inadequate number of viral copies (<250 copies / mL). A negative  result must be combined with clinical observations, patient history, and epidemiological information.  Fact Sheet for Patients:   RoadLapTop.co.za  Fact Sheet for Healthcare Providers: http://kim-miller.com/  This test is not yet approved or  cleared by the [place] FDA and has been authorized for detection and/or diagnosis of SARS-CoV-2 by FDA under an Emergency Use Authorization (EUA).  This EUA will remain in effect (meaning this test can be used) for the duration of the COVID-19 declaration under Section 564(b)(1) of the Act, 21 U.S.C. section 360bbb-3(b)(1), unless the authorization is terminated or revoked sooner.  Performed at [facility], [address] [place], [place] [address]     Procedures and diagnostic studies:  CT CHEST ABDOMEN PELVIS W CONTRAST  Result Date: 04/18/2023 CLINICAL DATA:  Sepsis EXAM: CT CHEST, ABDOMEN, AND PELVIS WITH CONTRAST TECHNIQUE: Multidetector CT imaging of the chest, abdomen and pelvis was performed following the standard protocol during bolus administration of intravenous contrast. RADIATION DOSE REDUCTION: This exam was performed according to the departmental dose-optimization program which includes automated exposure control, adjustment of the mA and/or kV according to patient size and/or use of iterative reconstruction technique. CONTRAST:  OMNIPAQUE IOHEXOL 300 MG/ML  SOLN COMPARISON:  04/18/2023 FINDINGS: CT CHEST FINDINGS Cardiovascular: Heart is unremarkable without pericardial effusion. No evidence of thoracic aortic aneurysm or dissection.  Atherosclerosis of the aortic arch and descending thoracic aorta. Mediastinum/Nodes: No enlarged mediastinal, hilar, or axillary lymph nodes. Thyroid gland, trachea, and esophagus demonstrate no significant findings. Lungs/Pleura: No acute airspace disease, effusion, or pneumothorax. Central airways are patent. Musculoskeletal: No acute or destructive bony abnormalities. Reconstructed images demonstrate no additional findings. CT ABDOMEN PELVIS FINDINGS Hepatobiliary: No focal liver abnormality is seen. No gallstones, gallbladder wall thickening, or biliary dilatation. Pancreas: Unremarkable. No pancreatic ductal dilatation or surrounding inflammatory changes. Spleen: Normal in size without focal abnormality. Adrenals/Urinary Tract: Adrenal glands are unremarkable. Kidneys are normal, without renal calculi, focal lesion, or hydronephrosis. Punctate gas lucencies within the bladder lumen may reflect recent catheterization. Bladder is minimally distended, limiting its evaluation. Stomach/Bowel: No bowel obstruction or ileus. Normal appendix right lower quadrant. No bowel wall thickening or inflammatory change. Vascular/Lymphatic: Aortic atherosclerosis. No enlarged abdominal or pelvic lymph nodes. Reproductive: Prostate is unremarkable. Other: No free fluid or free intraperitoneal gas. No abdominal wall hernia. Musculoskeletal: No acute or destructive bony abnormalities. Reconstructed images demonstrate no additional findings. IMPRESSION: 1. No acute intrathoracic, intra-abdominal, or intrapelvic process. 2. Punctate gas lucencies within the bladder lumen, likely from recent instrumentation. 3.  Aortic Atherosclerosis (ICD10-I70.0). Electronically Signed   By: Sharlet Salina M.D.   On: 04/18/2023 16:54   DG Chest Port 1 View  Result Date: 04/18/2023 CLINICAL DATA:  53 year old male with fever. EXAM: PORTABLE CHEST 1 VIEW COMPARISON:  Portable chest 04/08/2023 and earlier. FINDINGS: Portable AP upright view at 1036  hours. Similar mild patient rotation to the right. Chronic mild asymmetrically decreased right lung volume. Mediastinal contours remain within normal limits. Visualized tracheal air column is within normal limits. Allowing for portable technique the lungs are clear. No acute osseous abnormality identified. Negative visible bowel gas. IMPRESSION: No acute cardiopulmonary abnormality. Electronically Signed   By: Odessa Fleming M.D.   On: 04/18/2023 10:41               LOS: 11 days   Landrum Carbonell  [facility]   Pager on [email or web address]. If 7PM-7AM, please contact night-coverage at [email or web address]     04/19/2023, 4:12 PM

## 2023-04-19 NOTE — Progress Notes (Signed)
Patient not waking up enough to eat or take his oral meds. Sweating. Writer uncovered patient . Blood sugar 100. Temp 99.9. Will continue to monitor. MD aware.  Jared Mayer

## 2023-04-19 NOTE — Progress Notes (Signed)
PHARMACY - PHYSICIAN COMMUNICATION CRITICAL VALUE ALERT - BLOOD CULTURE IDENTIFICATION (BCID)  Jared Mayer is an 52 y.o. male who presented to [facility] on 04/08/2023 with a chief complaint of suspected sepsis.   Assessment:  Staph epi in 1 of 4 bottles, no resistance.  Most likely contaminant.  (include suspected source if known)  Name of physician (or Provider) Contacted: Larkin Ina, NP   Current antibiotics: Vanc, Zosyn   Changes to prescribed antibiotics recommended:  Recommendations accepted by provider  - pt is on recommended abx since this is probably contaminant.  No changes   No results found for this or any previous visit.  Sharanya Templin D 04/19/2023  4:50 AM

## 2023-04-19 NOTE — Progress Notes (Signed)
*  PRELIMINARY RESULTS* Echocardiogram 2D Echocardiogram has been performed.  Jared Mayer 04/19/2023, 1:09 PM

## 2023-04-20 ENCOUNTER — Inpatient Hospital Stay: Payer: Medicare HMO

## 2023-04-20 DIAGNOSIS — G3183 Dementia with Lewy bodies: Secondary | ICD-10-CM | POA: Diagnosis not present

## 2023-04-20 DIAGNOSIS — R4182 Altered mental status, unspecified: Secondary | ICD-10-CM | POA: Diagnosis not present

## 2023-04-20 DIAGNOSIS — R400 Somnolence: Secondary | ICD-10-CM | POA: Diagnosis not present

## 2023-04-20 DIAGNOSIS — R509 Fever, unspecified: Secondary | ICD-10-CM | POA: Diagnosis not present

## 2023-04-20 DIAGNOSIS — F250 Schizoaffective disorder, bipolar type: Secondary | ICD-10-CM | POA: Diagnosis not present

## 2023-04-20 DIAGNOSIS — G9340 Encephalopathy, unspecified: Secondary | ICD-10-CM | POA: Diagnosis not present

## 2023-04-20 LAB — PROCALCITONIN: Procalcitonin: <0.1 ng/mL

## 2023-04-20 LAB — CREATININE, SERUM
Creatinine, Ser: 0.75 mg/dL (ref 0.61–1.24)
GFR, Estimated: >60 mL/min (ref >60)

## 2023-04-20 LAB — COMPREHENSIVE METABOLIC PANEL
ALT: 51 U/L — ABNORMAL HIGH (ref 0–44)
AST: 100 U/L — ABNORMAL HIGH (ref 15–41)
Albumin: 3.3 g/dL — ABNORMAL LOW (ref 3.5–5.0)
Alkaline Phosphatase: 59 U/L (ref 38–126)
Anion gap: 11 (ref 5–15)
BUN: 27 mg/dL — ABNORMAL HIGH (ref 6–20)
CO2: 21 mmol/L — ABNORMAL LOW (ref 22–32)
Calcium: 8.3 mg/dL — ABNORMAL LOW (ref 8.9–10.3)
Chloride: 108 mmol/L (ref 98–111)
Creatinine, Ser: 0.7 mg/dL (ref 0.61–1.24)
GFR, Estimated: >60 mL/min (ref >60)
Glucose, Bld: 86 mg/dL (ref 70–99)
Potassium: 3.8 mmol/L (ref 3.5–5.1)
Sodium: 140 mmol/L (ref 135–145)
Total Bilirubin: 0.9 mg/dL (ref <1.2)
Total Protein: 6.3 g/dL — ABNORMAL LOW (ref 6.5–8.1)

## 2023-04-20 LAB — CBC WITH DIFFERENTIAL/PLATELET
Abs Immature Granulocytes: 0.01 10*3/uL (ref 0.00–0.07)
Basophils Absolute: 0 10*3/uL (ref 0.0–0.1)
Basophils Relative: 0 %
Eosinophils Absolute: 0 10*3/uL (ref 0.0–0.5)
Eosinophils Relative: 0 %
HCT: 44.9 % (ref 39.0–52.0)
Hemoglobin: 15.1 g/dL (ref 13.0–17.0)
Immature Granulocytes: 0 %
Lymphocytes Relative: 28 %
Lymphs Abs: 1.4 10*3/uL (ref 0.7–4.0)
MCH: 32.3 pg (ref 26.0–34.0)
MCHC: 33.6 g/dL (ref 30.0–36.0)
MCV: 96.1 fL (ref 80.0–100.0)
Monocytes Absolute: 0.5 10*3/uL (ref 0.1–1.0)
Monocytes Relative: 10 %
Neutro Abs: 3.2 10*3/uL (ref 1.7–7.7)
Neutrophils Relative %: 62 %
Platelets: 238 10*3/uL (ref 150–400)
RBC: 4.67 MIL/uL (ref 4.22–5.81)
RDW: 14.8 % (ref 11.5–15.5)
WBC: 5.2 10*3/uL (ref 4.0–10.5)
nRBC: 0 % (ref 0.0–0.2)

## 2023-04-20 LAB — LACTIC ACID, PLASMA: Lactic Acid, Venous: 1.1 mmol/L (ref 0.5–1.9)

## 2023-04-20 LAB — CK: Total CK: 1529 U/L — ABNORMAL HIGH (ref 49–397)

## 2023-04-20 NOTE — Progress Notes (Signed)
Mobility Specialist - Progress Note   04/20/23 1142  Mobility  Activity Dangled on edge of bed;Stood at bedside;Ambulated with assistance in room;Transferred from bed to chair  Level of Assistance Minimal assist, patient does 75% or more  Assistive Device None (HHA)  Activity Response Tolerated well  Mobility visit 1 Mobility  Mobility Specialist Start Time (ACUTE ONLY) [id number]  Mobility Specialist Stop Time (ACUTE ONLY) 1037  Mobility Specialist Time Calculation (min) (ACUTE ONLY) 39 min   Pt lethargic upon entry, requiring sternal rub to awaken-- eyes partially closed. Pt required MaxA to transfer EOB, increased arousal and open eyes upon transfer EOB. Pt dangled EOB while MS assisted with feeding, requiring cuing to swallow food. Pt STS from EOB MaxA +2 for safety, cuing for task initiation. Pt amb a few steps forward with multimodal cuing for amb-- HHA of BUE following in the front with CGA support from behind. While standing Pt had a forward R lean requiring MaxA to return to center of gravity. Pt transferred to the recliner via SPT, left seated in the recliner with alarm set and needs within reach.  Zetta Bills Mobility Specialist 04/20/23 12:12 PM

## 2023-04-20 NOTE — Progress Notes (Addendum)
Progress Note    Jared Mayer  WUJ:[id number] DOB: 1970-10-23  DOA: 04/08/2023 PCP: Almetta Lovely, [facility]      Brief Narrative:    Medical records reviewed and are as summarized below:  Jared Mayer is a 52 y.o. male with medical history significant for intellectual disability, schizoaffective disorder, Lewy body dementia, dyslipidemia, drug-induced parkinsonism, hypothyroidism, who presented today for hospital because of altered mental status, disorientation, repetitive speech pattern, erratic behavior and recurrent falls.  Reportedly, he had been more aggressive at the group home than usual.      Assessment/Plan:   Principal Problem:   Altered mental status Active Problems:   Rhabdomyolysis   Schizoaffective disorder, bipolar type (HCC)   Dyslipidemia   Hypothyroidism   Anxiety and depression   Sepsis without acute organ dysfunction (HCC)   Multiple falls    Body mass index is 25.95 kg/m.   Acute metabolic encephalopathy on underlying Lewy body dementia: He is lethargic.  CT head ordered on 04/20/2023 did not show any acute abnormality. Perhaps decreased alertness is due to psychotropics.  Continue to monitor.   Suspected sepsis, fever (rectal temp 101.4 F on 04/18/2023): SARS coronavirus test was negative.  Influenza test and respiratory viral panel were negative.  CT chest, abdomen and pelvis did not reveal any source of infection.  Urinalysis was unremarkable.  Lactic acid improved from 3.4-1.1.  1 out of 4 blood culture bottles showed methicillin-resistant Staph epidermidis and Staph hominis.  These are thought to be contaminants.  He was evaluated by ID specialist.   No vegetations or prosthetic heart valves on 2D echo. Discontinue IV antibiotics.  No source of infection identified.  No evidence of sepsis at this time.   Rhabdomyolysis: Improved.  CK down from 1,113  to 1,055  to 691.  Statin on hold.  Monitor CK levels.  TSH was normal (2.575) Chart  review showed that CK was 4,622 on 03/11/2021.   Dysphagia: Continue dysphagia 3 diet as able   Vitamin D deficiency: Vitamin D level on 04/07/2023 from an outside lab was 11.1.  Started on high-dose weekly vitamin D supplement on 04/15/2023.    Schizoaffective disorder, erratic behavior, depression, anxiety: Continue psychotropics.  Patient does not meet criteria for admission to inpatient behavioral health unit.  Unfortunately, group home will not take him back.  Follow-up with social worker to assist with disposition.   Multiple falls: Plan to discharge to long-term care facility.   Plan discussed with [name], mother, over the phone   Diet Order             DIET DYS 3 Room service appropriate? Yes with Assist; Fluid consistency: Thin  Diet effective now                            Consultants: Psychiatrist ID specialist  Procedures: None    Medications:    chlorhexidine  7.5 mL Mouth Rinse BID   divalproex  2,000 mg Oral QHS   docusate sodium  100 mg Oral BID   enoxaparin (LOVENOX) injection  40 mg Subcutaneous Q24H   glycopyrrolate  1 mg Oral BID   lactulose  20 g Oral BID   lamoTRIgine  100 mg Oral Daily   levothyroxine  125 mcg Oral Q0600   loxapine  25 mg Oral TID   midodrine  2.5 mg Oral BID WC   mirabegron ER  50 mg Oral Daily   pantoprazole  40 mg Oral Daily   PARoxetine  40 mg Oral Daily   polyethylene glycol  34 g Oral Daily   topiramate  75 mg Oral BID   valbenazine  80 mg Oral QHS   Vitamin D (Ergocalciferol)  50,000 Units Oral Q7 days   Continuous Infusions:  piperacillin-tazobactam (ZOSYN)  IV 3.375 g (04/20/23 0939)     Anti-infectives (From admission, onward)    Start     Dose/Rate Route Frequency Ordered Stop   04/18/23 2200  vancomycin (VANCOREADY) IVPB 1250 mg/250 mL  Status:  Discontinued        1,250 mg 166.7 mL/hr over 90 Minutes Intravenous Every 12 hours 04/18/23 1126 04/19/23 1557   04/18/23 1600   piperacillin-tazobactam (ZOSYN) IVPB 3.375 g        3.375 g 12.5 mL/hr over 240 Minutes Intravenous Every 8 hours 04/18/23 1507     04/18/23 1400  ceFEPIme (MAXIPIME) 2 g in sodium chloride 0.9 % 100 mL IVPB  Status:  Discontinued        2 g 200 mL/hr over 30 Minutes Intravenous Every 8 hours 04/18/23 1126 04/18/23 1128   04/18/23 1400  piperacillin-tazobactam (ZOSYN) IVPB 3.375 g  Status:  Discontinued        3.375 g 12.5 mL/hr over 240 Minutes Intravenous Every 8 hours 04/18/23 1128 04/18/23 1507   04/18/23 1300  vancomycin (VANCOREADY) IVPB 1500 mg/300 mL  Status:  Discontinued        1,500 mg 150 mL/hr over 120 Minutes Intravenous  Once 04/18/23 1126 04/18/23 1152   04/18/23 1300  Vancomycin (VANCOCIN) 1,500 mg in sodium chloride 0.9 % 500 mL IVPB        1,500 mg 250 mL/hr over 120 Minutes Intravenous  Once 04/18/23 1152 04/19/23 0700   04/11/23 1800  doxycycline (VIBRA-TABS) tablet 100 mg  Status:  Discontinued        100 mg Oral Every 12 hours 04/11/23 0734 04/11/23 0828   04/10/23 2000  vancomycin (VANCOREADY) IVPB 1750 mg/350 mL  Status:  Discontinued       Placed in "Followed by" Linked Group   1,750 mg 175 mL/hr over 120 Minutes Intravenous Every 24 hours 04/09/23 0015 04/09/23 1445   04/09/23 2200  cefTRIAXone (ROCEPHIN) 2 g in sodium chloride 0.9 % 100 mL IVPB  Status:  Discontinued        2 g 200 mL/hr over 30 Minutes Intravenous Every 24 hours 04/09/23 1445 04/11/23 0828   04/09/23 1500  doxycycline (VIBRAMYCIN) 100 mg in dextrose 5 % 250 mL IVPB  Status:  Discontinued        100 mg 125 mL/hr over 120 Minutes Intravenous Every 12 hours 04/09/23 1445 04/11/23 0734   04/09/23 1000  metroNIDAZOLE (FLAGYL) IVPB 500 mg  Status:  Discontinued        500 mg 100 mL/hr over 60 Minutes Intravenous Every 12 hours 04/08/23 2309 04/10/23 1436   04/09/23 0500  ceFEPIme (MAXIPIME) 2 g in sodium chloride 0.9 % 100 mL IVPB  Status:  Discontinued        2 g 200 mL/hr over 30 Minutes  Intravenous Every 8 hours 04/09/23 0009 04/09/23 1445   04/09/23 0200  vancomycin (VANCOCIN) IVPB 1000 mg/200 mL premix       Placed in "Followed by" Linked Group   1,000 mg 200 mL/hr over 60 Minutes Intravenous  Once 04/09/23 0015 04/09/23 0419   04/08/23 2315  ceFEPIme (MAXIPIME) 2 g in sodium chloride 0.9 % 100  mL IVPB  Status:  Discontinued        2 g 200 mL/hr over 30 Minutes Intravenous  Once 04/08/23 2309 04/08/23 2320   04/08/23 2315  vancomycin (VANCOCIN) IVPB 1000 mg/200 mL premix  Status:  Discontinued        1,000 mg 200 mL/hr over 60 Minutes Intravenous  Once 04/08/23 2309 04/08/23 2321   04/08/23 1915  ceFEPIme (MAXIPIME) 2 g in sodium chloride 0.9 % 100 mL IVPB        2 g 200 mL/hr over 30 Minutes Intravenous  Once 04/08/23 1900 04/08/23 2116   04/08/23 1915  metroNIDAZOLE (FLAGYL) IVPB 500 mg        500 mg 100 mL/hr over 60 Minutes Intravenous  Once 04/08/23 1900 04/08/23 2220   04/08/23 1915  vancomycin (VANCOCIN) IVPB 1000 mg/200 mL premix        1,000 mg 200 mL/hr over 60 Minutes Intravenous  Once 04/08/23 1900 04/08/23 2116              Family Communication/Anticipated D/C date and plan/Code Status   DVT prophylaxis: enoxaparin (LOVENOX) injection 40 mg Start: 04/09/23 1000     Code Status: Full Code  Family Communication: Plan discussed with [name], mother, over the phone Disposition Plan: Plan to discharge to long-term care facility   Status is: Inpatient Remains inpatient appropriate because: Awaiting placement       Subjective:   Interval events noted.  He is confused and cannot provide any history.  Objective:    Vitals:   04/19/23 1655 04/19/23 1900 04/20/23 0528 04/20/23 0810  BP: 135/71 135/71 135/84 129/81  Pulse: (!) 122 (!) 115 (!) 125 (!) 111  Resp: 18 18 20    Temp: 97.6 F (36.4 C) 97.6 F (36.4 C) 100.2 F (37.9 C) 98.2 F (36.8 C)  TempSrc:   Oral   SpO2: 96% 97% 95% 96%  Weight:       No data  found.   Intake/Output Summary (Last 24 hours) at 04/20/2023 1210 Last data filed at 04/19/2023 1652 Gross per 24 hour  Intake --  Output 500 ml  Net -500 ml    Filed Weights   04/08/23 2358  Weight: 77.4 kg    Exam:  GEN: NAD SKIN: Warm and dry EYES: No pallor or icterus ENT: MMM CV: RRR PULM: CTA B ABD: soft, ND, NT, +BS CNS: lethargic EXT: No edema or tenderness MSK: He has a chronic stooped over posture and usually tends to lean to his right side   Data Reviewed:   I have personally reviewed following labs and imaging studies:  Labs: Labs show the following:   Basic Metabolic Panel: Recent Labs  Lab 04/14/23 0611 04/18/23 1034 04/20/23 0405  NA 141 140  --   K 3.5 3.9  --   CL 105 105  --   CO2 29 23  --   GLUCOSE 89 134*  --   BUN 23* 32*  --   CREATININE 0.87 0.84 0.75  CALCIUM 8.8* 8.9  --    GFR Estimated Creatinine Clearance: 104.5 mL/min (by C-G formula based on SCr of 0.75 mg/dL). Liver Function Tests: Recent Labs  Lab 04/18/23 1034  AST 65*  ALT 45*  ALKPHOS 75  BILITOT 0.6  PROT 7.2  ALBUMIN 3.8    No results for input(s): "LIPASE", "AMYLASE" in the last 168 hours. No results for input(s): "AMMONIA" in the last 168 hours. Coagulation profile No results for input(s): "INR", "PROTIME"  in the last 168 hours.   CBC: Recent Labs  Lab 04/14/23 0611 04/18/23 1034 04/20/23 0405  WBC 4.7 5.3 5.2  NEUTROABS  --  4.2 3.2  HGB 14.2 15.6 15.1  HCT 42.7 45.4 44.9  MCV 96.6 95.4 96.1  PLT 269 306 238   Cardiac Enzymes: Recent Labs  Lab 04/14/23 0611 04/18/23 1028  CKTOTAL 1,055* 691*   BNP (last 3 results) No results for input(s): "PROBNP" in the last 8760 hours. CBG: Recent Labs  Lab 04/14/23 0845 04/14/23 1148 04/19/23 1040  GLUCAP 92 109* 100*   D-Dimer: No results for input(s): "DDIMER" in the last 72 hours. Hgb A1c: No results for input(s): "HGBA1C" in the last 72 hours. Lipid Profile: No results for  input(s): "CHOL", "HDL", "LDLCALC", "TRIG", "CHOLHDL", "LDLDIRECT" in the last 72 hours. Thyroid function studies: No results for input(s): "TSH", "T4TOTAL", "T3FREE", "THYROIDAB" in the last 72 hours.  Invalid input(s): "FREET3"  Anemia work up: No results for input(s): "VITAMINB12", "FOLATE", "FERRITIN", "TIBC", "IRON", "RETICCTPCT" in the last 72 hours. Sepsis Labs: Recent Labs  Lab 04/14/23 1191 04/18/23 1034 04/18/23 1310 04/18/23 1740 04/20/23 0405  PROCALCITON  --  <0.10  --   --   --   WBC 4.7 5.3  --   --  5.2  LATICACIDVEN  --  1.5 3.4* 2.8* 1.1    Microbiology Recent Results (from the past 240 hour(s))  Culture, blood (Routine X 2) w Reflex to ID Panel     Status: None (Preliminary result)   Collection Time: 04/18/23 10:34 AM   Specimen: BLOOD  Result Value Ref Range Status   Specimen Description BLOOD BLOOD RIGHT ARM  Final   Special Requests   Final    BOTTLES DRAWN AEROBIC AND ANAEROBIC Blood Culture results may not be optimal due to an excessive volume of blood received in culture bottles   Culture  Setup Time   Final    GRAM POSITIVE COCCI ANAEROBIC BOTTLE ONLY CRITICAL RESULT CALLED TO, READ BACK BY AND VERIFIED WITH: JASON ROBBINS AT 04/19/23 0448 BY AB Performed at [facility], [address] [name], [place] [address]    Culture GRAM POSITIVE COCCI  Final   Report Status PENDING  Incomplete  Blood Culture ID Panel (Reflexed)     Status: Abnormal   Collection Time: 04/18/23 10:34 AM  Result Value Ref Range Status   Enterococcus faecalis NOT DETECTED NOT DETECTED Final   Enterococcus Faecium NOT DETECTED NOT DETECTED Final   Listeria monocytogenes NOT DETECTED NOT DETECTED Final   Staphylococcus species DETECTED (A) NOT DETECTED Final    Comment: CRITICAL RESULT CALLED TO, READ BACK BY AND VERIFIED WITH: JASON ROBBINS AT 04/19/23 0448 BY AB    Staphylococcus aureus (BCID) NOT DETECTED NOT DETECTED Final   Staphylococcus epidermidis  DETECTED (A) NOT DETECTED Final    Comment: Methicillin (oxacillin) resistant coagulase negative staphylococcus. Possible blood culture contaminant (unless isolated from more than one blood culture draw or clinical case suggests pathogenicity). No antibiotic treatment is indicated for blood  culture contaminants. CRITICAL RESULT CALLED TO, READ BACK BY AND VERIFIED WITH: JASON ROBBINS AT 04/19/23 0448 BY AB    Staphylococcus lugdunensis NOT DETECTED NOT DETECTED Final   Streptococcus species NOT DETECTED NOT DETECTED Final   Streptococcus agalactiae NOT DETECTED NOT DETECTED Final   Streptococcus pneumoniae NOT DETECTED NOT DETECTED Final   Streptococcus pyogenes NOT DETECTED NOT DETECTED Final   A.calcoaceticus-baumannii NOT DETECTED NOT DETECTED Final   Bacteroides fragilis  NOT DETECTED NOT DETECTED Final   Enterobacterales NOT DETECTED NOT DETECTED Final   Enterobacter cloacae complex NOT DETECTED NOT DETECTED Final   Escherichia coli NOT DETECTED NOT DETECTED Final   Klebsiella aerogenes NOT DETECTED NOT DETECTED Final   Klebsiella oxytoca NOT DETECTED NOT DETECTED Final   Klebsiella pneumoniae NOT DETECTED NOT DETECTED Final   Proteus species NOT DETECTED NOT DETECTED Final   Salmonella species NOT DETECTED NOT DETECTED Final   Serratia marcescens NOT DETECTED NOT DETECTED Final   Haemophilus influenzae NOT DETECTED NOT DETECTED Final   Neisseria meningitidis NOT DETECTED NOT DETECTED Final   Pseudomonas aeruginosa NOT DETECTED NOT DETECTED Final   Stenotrophomonas maltophilia NOT DETECTED NOT DETECTED Final   Candida albicans NOT DETECTED NOT DETECTED Final   Candida auris NOT DETECTED NOT DETECTED Final   Candida glabrata NOT DETECTED NOT DETECTED Final   Candida krusei NOT DETECTED NOT DETECTED Final   Candida parapsilosis NOT DETECTED NOT DETECTED Final   Candida tropicalis NOT DETECTED NOT DETECTED Final   Cryptococcus neoformans/gattii NOT DETECTED NOT DETECTED Final    Methicillin resistance mecA/C DETECTED (A) NOT DETECTED Final    Comment: CRITICAL RESULT CALLED TO, READ BACK BY AND VERIFIED WITH: JASON ROBBINS AT 04/19/23 0448 BY AB Performed at [facility], [address] [place], [place] [address]   Culture, blood (Routine X 2) w Reflex to ID Panel     Status: None (Preliminary result)   Collection Time: 04/18/23 10:35 AM   Specimen: BLOOD  Result Value Ref Range Status   Specimen Description BLOOD BLOOD RIGHT HAND  Final   Special Requests   Final    BOTTLES DRAWN AEROBIC AND ANAEROBIC Blood Culture results may not be optimal due to an excessive volume of blood received in culture bottles   Culture   Final    NO GROWTH 2 DAYS Performed at [facility], [address] [place], [place] [address]    Report Status PENDING  Incomplete  MRSA Next Gen by PCR, Nasal     Status: None   Collection Time: 04/18/23  1:12 PM   Specimen: Urine, Clean Catch; Nasal Swab  Result Value Ref Range Status   MRSA by PCR Next Gen NOT DETECTED NOT DETECTED Final    Comment: (NOTE) The GeneXpert MRSA Assay (FDA approved for NASAL specimens only), is one component of a comprehensive MRSA colonization surveillance program. It is not intended to diagnose MRSA infection nor to guide or monitor treatment for MRSA infections. Test performance is not FDA approved in patients less than 36 years old. Performed at [facility], [address] [place], [place] [address]   SARS Coronavirus 2 by RT PCR (hospital order, performed in [facility] hospital lab) *cepheid single result test* Anterior Nasal Swab     Status: None   Collection Time: 04/19/23  1:31 PM   Specimen: Anterior Nasal Swab  Result Value Ref Range Status   SARS Coronavirus 2 by RT PCR NEGATIVE NEGATIVE Final    Comment: (NOTE) SARS-CoV-2 target nucleic acids are NOT DETECTED.  The SARS-CoV-2 RNA is generally detectable in upper and lower respiratory specimens during the  acute phase of infection. The lowest concentration of SARS-CoV-2 viral copies this assay can detect is 250 copies / mL. A negative result does not preclude SARS-CoV-2 infection and should not be used as the sole basis for treatment or other patient management decisions.  A negative result may occur with improper specimen collection / handling,  submission of specimen other than nasopharyngeal swab, presence of viral mutation(s) within the areas targeted by this assay, and inadequate number of viral copies (<250 copies / mL). A negative result must be combined with clinical observations, patient history, and epidemiological information.  Fact Sheet for Patients:   RoadLapTop.co.za  Fact Sheet for Healthcare Providers: http://kim-miller.com/  This test is not yet approved or  cleared by the [place] FDA and has been authorized for detection and/or diagnosis of SARS-CoV-2 by FDA under an Emergency Use Authorization (EUA).  This EUA will remain in effect (meaning this test can be used) for the duration of the COVID-19 declaration under Section 564(b)(1) of the Act, 21 U.S.C. section 360bbb-3(b)(1), unless the authorization is terminated or revoked sooner.  Performed at [facility], [address] [place], [place] [address]   Respiratory (~20 pathogens) panel by PCR     Status: None   Collection Time: 04/19/23  1:31 PM   Specimen: Nasopharyngeal Swab; Respiratory  Result Value Ref Range Status   Adenovirus NOT DETECTED NOT DETECTED Final   Coronavirus 229E NOT DETECTED NOT DETECTED Final    Comment: (NOTE) The Coronavirus on the Respiratory Panel, DOES NOT test for the novel  Coronavirus (2019 nCoV)    Coronavirus HKU1 NOT DETECTED NOT DETECTED Final   Coronavirus NL63 NOT DETECTED NOT DETECTED Final   Coronavirus OC43 NOT DETECTED NOT DETECTED Final   Metapneumovirus NOT DETECTED NOT DETECTED Final   Rhinovirus /  Enterovirus NOT DETECTED NOT DETECTED Final   Influenza A NOT DETECTED NOT DETECTED Final   Influenza B NOT DETECTED NOT DETECTED Final   Parainfluenza Virus 1 NOT DETECTED NOT DETECTED Final   Parainfluenza Virus 2 NOT DETECTED NOT DETECTED Final   Parainfluenza Virus 3 NOT DETECTED NOT DETECTED Final   Parainfluenza Virus 4 NOT DETECTED NOT DETECTED Final   Respiratory Syncytial Virus NOT DETECTED NOT DETECTED Final   Bordetella pertussis NOT DETECTED NOT DETECTED Final   Bordetella Parapertussis NOT DETECTED NOT DETECTED Final   Chlamydophila pneumoniae NOT DETECTED NOT DETECTED Final   Mycoplasma pneumoniae NOT DETECTED NOT DETECTED Final    Comment: Performed at [facility], [address], [place], [place] [address]    Procedures and diagnostic studies:  CT HEAD WO CONTRAST ( )  Result Date: 04/20/2023 CLINICAL DATA:  Altered mental status. EXAM: CT HEAD WITHOUT CONTRAST TECHNIQUE: Contiguous axial images were obtained from the base of the skull through the vertex without intravenous contrast. RADIATION DOSE REDUCTION: This exam was performed according to the departmental dose-optimization program which includes automated exposure control, adjustment of the mA and/or kV according to patient size and/or use of iterative reconstruction technique. COMPARISON:  Head CT 04/03/2023. FINDINGS: Brain: No acute intracranial hemorrhage. Gray-white differentiation is preserved. No hydrocephalus or extra-axial collection. No mass effect or midline shift. Vascular: No hyperdense vessel or unexpected calcification. Skull: No calvarial fracture or suspicious bone lesion. Skull base is unremarkable. Sinuses/Orbits: No acute finding. Other: None. IMPRESSION: No acute intracranial abnormality. Electronically Signed   By: Orvan Falconer M.D.   On: 04/20/2023 11:44   ECHOCARDIOGRAM COMPLETE  Result Date: 04/19/2023    ECHOCARDIOGRAM REPORT   Patient Name:   Jared Mayer Date of Exam: 04/19/2023  Medical Rec #:  [id number]    Height:       68.0 in Accession #:    [id number]   Weight:       170.6 lb Date of Birth:  Dec 16, 1970    BSA:  1.910 m Patient Age:    52 years     BP:           124/75 mmHg Patient Gender: M            HR:           108 bpm. Exam Location:  [facility] Procedure: 2D Echo, Cardiac Doppler and Color Doppler Indications:     Bacteremia, Fever  History:         Patient has no prior history of Echocardiogram examinations.                  Signs/Symptoms:Bacteremia, Altered Mental Status and Fever.  Sonographer:     Mikki Harbor Referring Phys:  [id number] Lurene Shadow Diagnosing Phys: Alwyn Pea MD  Sonographer Comments: Technically challenging study due to limited acoustic windows, suboptimal parasternal window, suboptimal apical window and no subcostal window. IMPRESSIONS  1. Left ventricular ejection fraction, by estimation, is 70 to 75%. The left ventricle has hyperdynamic function. The left ventricle has no regional wall motion abnormalities. Left ventricular diastolic parameters are consistent with Grade I diastolic dysfunction (impaired relaxation).  2. Right ventricular systolic function is normal. The right ventricular size is normal.  3. The mitral valve is normal in structure. Mild mitral valve regurgitation.  4. The aortic valve is normal in structure. Aortic valve regurgitation is not visualized. Aortic valve sclerosis is present, with no evidence of aortic valve stenosis. FINDINGS  Left Ventricle: Left ventricular ejection fraction, by estimation, is 70 to 75%. The left ventricle has hyperdynamic function. The left ventricle has no regional wall motion abnormalities. The left ventricular internal cavity size was normal in size. There is no left ventricular hypertrophy. Left ventricular diastolic parameters are consistent with Grade I diastolic dysfunction (impaired relaxation). Right Ventricle: The right ventricular size is normal. No increase in right ventricular wall  thickness. Right ventricular systolic function is normal. Left Atrium: Left atrial size was normal in size. Right Atrium: Right atrial size was normal in size. Pericardium: There is no evidence of pericardial effusion. Mitral Valve: The mitral valve is normal in structure. Mild mitral valve regurgitation. MV peak gradient, 4.4 mmHg. The mean mitral valve gradient is 2.0 mmHg. Tricuspid Valve: The tricuspid valve is normal in structure. Tricuspid valve regurgitation is mild. Aortic Valve: The aortic valve is normal in structure. Aortic valve regurgitation is not visualized. Aortic valve sclerosis is present, with no evidence of aortic valve stenosis. Aortic valve mean gradient measures 3.0 mmHg. Aortic valve peak gradient measures 6.5 mmHg. Aortic valve area, by VTI measures 2.36 cm. Pulmonic Valve: The pulmonic valve was normal in structure. Pulmonic valve regurgitation is not visualized. Aorta: The ascending aorta was not well visualized. IAS/Shunts: No atrial level shunt detected by color flow Doppler.  LEFT VENTRICLE PLAX 2D LVIDd:         2.60 cm LVIDs:         1.60 cm LV PW:         1.10 cm LV IVS:        1.20 cm LVOT diam:     1.90 cm LV SV:         60 LV SV Index:   32 LVOT Area:     2.84 cm  RIGHT VENTRICLE RV Basal diam:  3.70 cm RV Mid diam:    3.10 cm RV S prime:     23.10 cm/s TAPSE (M-mode): 2.8 cm LEFT ATRIUM  Index       RIGHT ATRIUM           Index LA diam:    2.90 cm 1.52 cm/m  RA Area:     11.90 cm                                 RA Volume:   28.20 ml  14.76 ml/m  AORTIC VALVE AV Area (Vmax):    2.55 cm AV Area (Vmean):   2.81 cm AV Area (VTI):     2.36 cm AV Vmax:           127.00 cm/s AV Vmean:          80.200 cm/s AV VTI:            0.256 m AV Peak Grad:      6.5 mmHg AV Mean Grad:      3.0 mmHg LVOT Vmax:         114.00 cm/s LVOT Vmean:        79.600 cm/s LVOT VTI:          0.213 m LVOT/AV VTI ratio: 0.83  AORTA Ao Root diam: 3.70 cm MITRAL VALVE MV Area (PHT): 6.65 cm    SHUNTS  MV Area VTI:   2.89 cm    Systemic VTI:  0.21 m MV Peak grad:  4.4 mmHg    Systemic Diam: 1.90 cm MV Mean grad:  2.0 mmHg MV Vmax:       1.05 m/s MV Vmean:      61.4 cm/s MV Decel Time: 114 msec MV E velocity: 79.70 cm/s MV A velocity: 91.70 cm/s MV E/A ratio:  0.87 Dwayne D Callwood MD Electronically signed by Alwyn Pea MD Signature Date/Time: 04/19/2023/4:35:27 PM    Final    CT CHEST ABDOMEN PELVIS W CONTRAST  Result Date: 04/18/2023 CLINICAL DATA:  Sepsis EXAM: CT CHEST, ABDOMEN, AND PELVIS WITH CONTRAST TECHNIQUE: Multidetector CT imaging of the chest, abdomen and pelvis was performed following the standard protocol during bolus administration of intravenous contrast. RADIATION DOSE REDUCTION: This exam was performed according to the departmental dose-optimization program which includes automated exposure control, adjustment of the mA and/or kV according to patient size and/or use of iterative reconstruction technique. CONTRAST:  OMNIPAQUE IOHEXOL 300 MG/ML  SOLN COMPARISON:  04/18/2023 FINDINGS: CT CHEST FINDINGS Cardiovascular: Heart is unremarkable without pericardial effusion. No evidence of thoracic aortic aneurysm or dissection. Atherosclerosis of the aortic arch and descending thoracic aorta. Mediastinum/Nodes: No enlarged mediastinal, hilar, or axillary lymph nodes. Thyroid gland, trachea, and esophagus demonstrate no significant findings. Lungs/Pleura: No acute airspace disease, effusion, or pneumothorax. Central airways are patent. Musculoskeletal: No acute or destructive bony abnormalities. Reconstructed images demonstrate no additional findings. CT ABDOMEN PELVIS FINDINGS Hepatobiliary: No focal liver abnormality is seen. No gallstones, gallbladder wall thickening, or biliary dilatation. Pancreas: Unremarkable. No pancreatic ductal dilatation or surrounding inflammatory changes. Spleen: Normal in size without focal abnormality. Adrenals/Urinary Tract: Adrenal glands are  unremarkable. Kidneys are normal, without renal calculi, focal lesion, or hydronephrosis. Punctate gas lucencies within the bladder lumen may reflect recent catheterization. Bladder is minimally distended, limiting its evaluation. Stomach/Bowel: No bowel obstruction or ileus. Normal appendix right lower quadrant. No bowel wall thickening or inflammatory change. Vascular/Lymphatic: Aortic atherosclerosis. No enlarged abdominal or pelvic lymph nodes. Reproductive: Prostate is unremarkable. Other: No free fluid or free intraperitoneal gas. No abdominal wall hernia. Musculoskeletal: No  acute or destructive bony abnormalities. Reconstructed images demonstrate no additional findings. IMPRESSION: 1. No acute intrathoracic, intra-abdominal, or intrapelvic process. 2. Punctate gas lucencies within the bladder lumen, likely from recent instrumentation. 3.  Aortic Atherosclerosis (ICD10-I70.0). Electronically Signed   By: Sharlet Salina M.D.   On: 04/18/2023 16:54               LOS: 12 days   Sharone Picchi  [facility]   Pager on [email or web address]. If 7PM-7AM, please contact night-coverage at [email or web address]     04/20/2023, 12:10 PM

## 2023-04-20 NOTE — Progress Notes (Signed)
Physical Therapy Treatment Patient Details Name: Jared Mayer MRN: [id number] DOB: [birth year] Today's Date: 04/20/2023   History of Present Illness Jared Mayer is a 52yoM who comes to [facility] on 04/08/23 from group home after progression in behavior disturbance- he has been more irritable, erratic behavior, repetitive speech, falls and stumbles. PMH dementia, bipolar type schizoaffective d/o, intellectual disability, drug-induced parkinsonism Pt lives in group home at baseline. Was here in ED from 11/23-11/28 for similar issue.    PT Comments  Pt lethargic, but with eyes open for majority of mobility, some ability to maintain eye contact. Noted for RUE weakness and stiffness, RN notified. Pt per RN has also displayed increased lethargy and interaction for the last day or so. Pt was totalAx2 for supine <> sit, and unable to maintain seated balance without at least mod-maxA. The patient would benefit from further skilled PT intervention to continue to progress towards goals as able.     If plan is discharge home, recommend the following: Assist for transportation;Assistance with cooking/housework;Help with stairs or ramp for entrance;Assistance with feeding;A little help with walking and/or transfers;A lot of help with bathing/dressing/bathroom   Can travel by private vehicle     No  Equipment Recommendations  None recommended by PT    Recommendations for Other Services       Precautions / Restrictions Precautions Precautions: Fall Restrictions Weight Bearing Restrictions: No     Mobility  Bed Mobility Overal bed mobility: Needs Assistance Bed Mobility: Supine to Sit, Sit to Supine     Supine to sit: Total assist, +2 for physical assistance Sit to supine: Total assist, +2 for physical assistance        Transfers                        Ambulation/Gait                   Stairs             Wheelchair Mobility     Tilt Bed    Modified Rankin  (Stroke Patients Only)       Balance Overall balance assessment: Needs assistance Sitting-balance support: Feet supported Sitting balance-Leahy Scale: Poor Sitting balance - Comments: R lateral lean noted, posterior lean mod-maxA. Postural control: Posterior lean, Right lateral lean                                  Cognition Arousal: Lethargic, Alert (variable, but did not speak to PT. maintained eye contact/eyes open with mobility) Behavior During Therapy: WFL for tasks assessed/performed Overall Cognitive Status: No family/caregiver present to determine baseline cognitive functioning                                          Exercises      General Comments        Pertinent Vitals/Pain Pain Assessment Pain Assessment: Faces Faces Pain Scale: No hurt    Home Living                          Prior Function            PT Goals (current goals can now be found in the care plan section) Progress towards PT goals: Not progressing toward goals -  comment    Frequency    Min 1X/week      PT Plan      Co-evaluation              AM-PAC PT "6 Clicks" Mobility   Outcome Measure  Help needed turning from your back to your side while in a flat bed without using bedrails?: Total Help needed moving from lying on your back to sitting on the side of a flat bed without using bedrails?: Total Help needed moving to and from a bed to a chair (including a wheelchair)?: Total Help needed standing up from a chair using your arms (e.g., wheelchair or bedside chair)?: Total Help needed to walk in hospital room?: Total Help needed climbing 3-5 steps with a railing? : Total 6 Click Score: 6    End of Session   Activity Tolerance: Patient limited by lethargy Patient left: in bed;with call bell/phone within reach;with bed alarm set Nurse Communication: Mobility status PT Visit Diagnosis: Other abnormalities of gait and mobility  (R26.89);Muscle weakness (generalized) (M62.81);History of falling (Z91.81)     Time: 3244-0102 PT Time Calculation (min) (ACUTE ONLY): 13 min  Charges:    $Therapeutic Activity: 8-22 mins PT General Charges $$ ACUTE PT VISIT: 1 Visit                    Olga Coaster PT, DPT 10:38 AM,04/20/23

## 2023-04-20 NOTE — TOC Progression Note (Addendum)
Transition of Care [facility]) - Progression Note    Patient Details  Name: Jared Mayer MRN: [id number] Date of Birth: March 29, 1971  Transition of Care [facility]) CM/SW Contact  Margarito Liner, LCSW Phone Number: 04/20/2023, 8:32 AM  Clinical Narrative:   PASARR obtained: [id number] F. Expires 06/18/2023.  10:20 am: Network engineer at [facility] will review referral. CSW left message for [facility] admissions coordinator to see if they have any male LTC beds.  12:55 pm: Expanded SNF search to other counties.  Expected Discharge Plan: Assisted Living Barriers to Discharge: Continued Medical Work up  Expected Discharge Plan and Services       Living arrangements for the past 2 months: Assisted Living Facility                                       Social Determinants of Health (SDOH) Interventions SDOH Screenings   Food Insecurity: No Food Insecurity (04/12/2023)  Housing: Patient Unable To Answer (04/12/2023)  Transportation Needs: Patient Unable To Answer (04/12/2023)  Utilities: Patient Unable To Answer (04/12/2023)  Financial Resource Strain: Low Risk  (03/29/2018)  Physical Activity: Unknown (03/29/2018)  Social Connections: Unknown (03/29/2018)  Stress: No Stress Concern Present (03/29/2018)  Tobacco Use: Low Risk  (04/08/2023)    Readmission Risk Interventions     No data to display

## 2023-04-20 NOTE — Plan of Care (Signed)

## 2023-04-20 NOTE — Progress Notes (Signed)
Date of Admission:  04/08/2023    ID: Jared Mayer is a 52 y.o. male  Principal Problem:   Altered mental status Active Problems:   Schizoaffective disorder, bipolar type (HCC)   Dyslipidemia   Hypothyroidism   Anxiety and depression   Rhabdomyolysis   Sepsis without acute organ dysfunction (HCC)   Multiple falls    Subjective: Non verbal  Medications:   chlorhexidine  7.5 mL Mouth Rinse BID   divalproex  2,000 mg Oral QHS   docusate sodium  100 mg Oral BID   enoxaparin (LOVENOX) injection  40 mg Subcutaneous Q24H   glycopyrrolate  1 mg Oral BID   lactulose  20 g Oral BID   lamoTRIgine  100 mg Oral Daily   levothyroxine  125 mcg Oral Q0600   loxapine  25 mg Oral TID   midodrine  2.5 mg Oral BID WC   mirabegron ER  50 mg Oral Daily   pantoprazole  40 mg Oral Daily   PARoxetine  40 mg Oral Daily   polyethylene glycol  34 g Oral Daily   topiramate  75 mg Oral BID   valbenazine  80 mg Oral QHS   Vitamin D (Ergocalciferol)  50,000 Units Oral Q7 days    Objective: Vital signs in last 24 hours: Patient Vitals for the past 24 hrs:  BP Temp Temp src Pulse Resp SpO2  04/20/23 0810 129/81 98.2 F (36.8 C) -- (!) 111 -- 96 %  04/20/23 0528 135/84 100.2 F (37.9 C) Oral (!) 125 20 95 %  04/19/23 1900 135/71 97.6 F (36.4 C) -- (!) 115 18 97 %  04/19/23 1655 135/71 97.6 F (36.4 C) -- (!) 122 18 96 %      PHYSICAL EXAM:  General: face in grimace, tongue protruding Jaw clenched  Lungs:b/l air entry. Heart: Tachycardia Abdomen: Soft, non-tender,not distended. Bowel sounds normal. No masses Extremities: increased tone all lims flexion contracture Skin: No rashes or lesions. Or bruising   Lab Results    Latest Ref Rng & Units 04/20/2023    4:05 AM 04/18/2023   10:34 AM 04/14/2023    6:11 AM  CBC  WBC 4.0 - 10.5 K/uL 5.2  5.3  4.7   Hemoglobin 13.0 - 17.0 g/dL 47.8  29.5  62.1   Hematocrit 39.0 - 52.0 % 44.9  45.4  42.7   Platelets 150 - 400 K/uL 238  306  269         Latest Ref Rng & Units 04/20/2023    4:05 AM 04/18/2023   10:34 AM 04/14/2023    6:11 AM  CMP  Glucose 70 - 99 mg/dL  308  89   BUN 6 - 20 mg/dL  32  23   Creatinine 6.57 - 1.24 mg/dL 8.46  9.62  9.52   Sodium 135 - 145 mmol/L  140  141   Potassium 3.5 - 5.1 mmol/L  3.9  3.5   Chloride 98 - 111 mmol/L  105  105   CO2 22 - 32 mmol/L  23  29   Calcium 8.9 - 10.3 mg/dL  8.9  8.8   Total Protein 6.5 - 8.1 g/dL  7.2    Total Bilirubin <1.2 mg/dL  0.6    Alkaline Phos 38 - 126 U/L  75    AST 15 - 41 U/L  65    ALT 0 - 44 U/L  45        Microbiology:  Studies/Results: CT HEAD  WO CONTRAST ( )  Result Date: 04/20/2023 CLINICAL DATA:  Altered mental status. EXAM: CT HEAD WITHOUT CONTRAST TECHNIQUE: Contiguous axial images were obtained from the base of the skull through the vertex without intravenous contrast. RADIATION DOSE REDUCTION: This exam was performed according to the departmental dose-optimization program which includes automated exposure control, adjustment of the mA and/or kV according to patient size and/or use of iterative reconstruction technique. COMPARISON:  Head CT 04/03/2023. FINDINGS: Brain: No acute intracranial hemorrhage. Gray-white differentiation is preserved. No hydrocephalus or extra-axial collection. No mass effect or midline shift. Vascular: No hyperdense vessel or unexpected calcification. Skull: No calvarial fracture or suspicious bone lesion. Skull base is unremarkable. Sinuses/Orbits: No acute finding. Other: None. IMPRESSION: No acute intracranial abnormality. Electronically Signed   By: Orvan Falconer M.D.   On: 04/20/2023 11:44   ECHOCARDIOGRAM COMPLETE  Result Date: 04/19/2023    ECHOCARDIOGRAM REPORT   Patient Name:   Jared Mayer Date of Exam: 04/19/2023 Medical Rec #:  [id number]    Height:       68.0 in Accession #:    [id number]   Weight:       170.6 lb Date of Birth:  10/20/1970    BSA:          1.910 m Patient Age:    52 years     BP:            124/75 mmHg Patient Gender: M            HR:           108 bpm. Exam Location:  [facility] Procedure: 2D Echo, Cardiac Doppler and Color Doppler Indications:     Bacteremia, Fever  History:         Patient has no prior history of Echocardiogram examinations.                  Signs/Symptoms:Bacteremia, Altered Mental Status and Fever.  Sonographer:     Mikki Harbor Referring Phys:  [id number] Lurene Shadow Diagnosing Phys: Alwyn Pea MD  Sonographer Comments: Technically challenging study due to limited acoustic windows, suboptimal parasternal window, suboptimal apical window and no subcostal window. IMPRESSIONS  1. Left ventricular ejection fraction, by estimation, is 70 to 75%. The left ventricle has hyperdynamic function. The left ventricle has no regional wall motion abnormalities. Left ventricular diastolic parameters are consistent with Grade I diastolic dysfunction (impaired relaxation).  2. Right ventricular systolic function is normal. The right ventricular size is normal.  3. The mitral valve is normal in structure. Mild mitral valve regurgitation.  4. The aortic valve is normal in structure. Aortic valve regurgitation is not visualized. Aortic valve sclerosis is present, with no evidence of aortic valve stenosis. FINDINGS  Left Ventricle: Left ventricular ejection fraction, by estimation, is 70 to 75%. The left ventricle has hyperdynamic function. The left ventricle has no regional wall motion abnormalities. The left ventricular internal cavity size was normal in size. There is no left ventricular hypertrophy. Left ventricular diastolic parameters are consistent with Grade I diastolic dysfunction (impaired relaxation). Right Ventricle: The right ventricular size is normal. No increase in right ventricular wall thickness. Right ventricular systolic function is normal. Left Atrium: Left atrial size was normal in size. Right Atrium: Right atrial size was normal in size. Pericardium: There is no evidence of  pericardial effusion. Mitral Valve: The mitral valve is normal in structure. Mild mitral valve regurgitation. MV peak gradient, 4.4 mmHg. The mean mitral valve gradient is 2.0 mmHg. Tricuspid  Valve: The tricuspid valve is normal in structure. Tricuspid valve regurgitation is mild. Aortic Valve: The aortic valve is normal in structure. Aortic valve regurgitation is not visualized. Aortic valve sclerosis is present, with no evidence of aortic valve stenosis. Aortic valve mean gradient measures 3.0 mmHg. Aortic valve peak gradient measures 6.5 mmHg. Aortic valve area, by VTI measures 2.36 cm. Pulmonic Valve: The pulmonic valve was normal in structure. Pulmonic valve regurgitation is not visualized. Aorta: The ascending aorta was not well visualized. IAS/Shunts: No atrial level shunt detected by color flow Doppler.  LEFT VENTRICLE PLAX 2D LVIDd:         2.60 cm LVIDs:         1.60 cm LV PW:         1.10 cm LV IVS:        1.20 cm LVOT diam:     1.90 cm LV SV:         60 LV SV Index:   32 LVOT Area:     2.84 cm  RIGHT VENTRICLE RV Basal diam:  3.70 cm RV Mid diam:    3.10 cm RV S prime:     23.10 cm/s TAPSE (M-mode): 2.8 cm LEFT ATRIUM         Index       RIGHT ATRIUM           Index LA diam:    2.90 cm 1.52 cm/m  RA Area:     11.90 cm                                 RA Volume:   28.20 ml  14.76 ml/m  AORTIC VALVE AV Area (Vmax):    2.55 cm AV Area (Vmean):   2.81 cm AV Area (VTI):     2.36 cm AV Vmax:           127.00 cm/s AV Vmean:          80.200 cm/s AV VTI:            0.256 m AV Peak Grad:      6.5 mmHg AV Mean Grad:      3.0 mmHg LVOT Vmax:         114.00 cm/s LVOT Vmean:        79.600 cm/s LVOT VTI:          0.213 m LVOT/AV VTI ratio: 0.83  AORTA Ao Root diam: 3.70 cm MITRAL VALVE MV Area (PHT): 6.65 cm    SHUNTS MV Area VTI:   2.89 cm    Systemic VTI:  0.21 m MV Peak grad:  4.4 mmHg    Systemic Diam: 1.90 cm MV Mean grad:  2.0 mmHg MV Vmax:       1.05 m/s MV Vmean:      61.4 cm/s MV Decel Time: 114 msec  MV E velocity: 79.70 cm/s MV A velocity: 91.70 cm/s MV E/A ratio:  0.87 Dwayne D Callwood MD Electronically signed by Alwyn Pea MD Signature Date/Time: 04/19/2023/4:35:27 PM    Final    CT CHEST ABDOMEN PELVIS W CONTRAST  Result Date: 04/18/2023 CLINICAL DATA:  Sepsis EXAM: CT CHEST, ABDOMEN, AND PELVIS WITH CONTRAST TECHNIQUE: Multidetector CT imaging of the chest, abdomen and pelvis was performed following the standard protocol during bolus administration of intravenous contrast. RADIATION DOSE REDUCTION: This exam was performed according to the departmental dose-optimization program which includes automated  exposure control, adjustment of the mA and/or kV according to patient size and/or use of iterative reconstruction technique. CONTRAST:  OMNIPAQUE IOHEXOL 300 MG/ML  SOLN COMPARISON:  04/18/2023 FINDINGS: CT CHEST FINDINGS Cardiovascular: Heart is unremarkable without pericardial effusion. No evidence of thoracic aortic aneurysm or dissection. Atherosclerosis of the aortic arch and descending thoracic aorta. Mediastinum/Nodes: No enlarged mediastinal, hilar, or axillary lymph nodes. Thyroid gland, trachea, and esophagus demonstrate no significant findings. Lungs/Pleura: No acute airspace disease, effusion, or pneumothorax. Central airways are patent. Musculoskeletal: No acute or destructive bony abnormalities. Reconstructed images demonstrate no additional findings. CT ABDOMEN PELVIS FINDINGS Hepatobiliary: No focal liver abnormality is seen. No gallstones, gallbladder wall thickening, or biliary dilatation. Pancreas: Unremarkable. No pancreatic ductal dilatation or surrounding inflammatory changes. Spleen: Normal in size without focal abnormality. Adrenals/Urinary Tract: Adrenal glands are unremarkable. Kidneys are normal, without renal calculi, focal lesion, or hydronephrosis. Punctate gas lucencies within the bladder lumen may reflect recent catheterization. Bladder is minimally distended,  limiting its evaluation. Stomach/Bowel: No bowel obstruction or ileus. Normal appendix right lower quadrant. No bowel wall thickening or inflammatory change. Vascular/Lymphatic: Aortic atherosclerosis. No enlarged abdominal or pelvic lymph nodes. Reproductive: Prostate is unremarkable. Other: No free fluid or free intraperitoneal gas. No abdominal wall hernia. Musculoskeletal: No acute or destructive bony abnormalities. Reconstructed images demonstrate no additional findings. IMPRESSION: 1. No acute intrathoracic, intra-abdominal, or intrapelvic process. 2. Punctate gas lucencies within the bladder lumen, likely from recent instrumentation. 3.  Aortic Atherosclerosis (ICD10-I70.0). Electronically Signed   By: Sharlet Salina M.D.   On: 04/18/2023 16:54     Assessment/Plan: ? Pt with Lewy body dementia, schizoaffective disorder,has been in hospital since 11/28- fever on admission with AMS which resolved and then fever again on 04/18/23   Altered mental status /Fever/ rhabdomyloysis Staph epidermidis in 1 of 4 is a contaminant - so will not treat There is no pneumonia or UTI Concern would be drug induced- ( NMS) Loxapine  Also on multiple other drugs lamictal, topamax, depakote Glycopyrrolate can also contribute to the above symptoms Will Dc vanco  DC zosyn if culture remains neg  Discussed with hospitalist

## 2023-04-21 ENCOUNTER — Inpatient Hospital Stay: Payer: Medicare HMO

## 2023-04-21 DIAGNOSIS — F419 Anxiety disorder, unspecified: Secondary | ICD-10-CM

## 2023-04-21 DIAGNOSIS — R400 Somnolence: Secondary | ICD-10-CM | POA: Diagnosis not present

## 2023-04-21 DIAGNOSIS — F32A Depression, unspecified: Secondary | ICD-10-CM

## 2023-04-21 DIAGNOSIS — R569 Unspecified convulsions: Secondary | ICD-10-CM

## 2023-04-21 DIAGNOSIS — F250 Schizoaffective disorder, bipolar type: Secondary | ICD-10-CM | POA: Diagnosis not present

## 2023-04-21 DIAGNOSIS — R6510 Systemic inflammatory response syndrome (SIRS) of non-infectious origin without acute organ dysfunction: Secondary | ICD-10-CM

## 2023-04-21 DIAGNOSIS — M6282 Rhabdomyolysis: Secondary | ICD-10-CM | POA: Diagnosis not present

## 2023-04-21 LAB — CULTURE, BLOOD (ROUTINE X 2)

## 2023-04-21 LAB — URINALYSIS, W/ REFLEX TO CULTURE (INFECTION SUSPECTED)
Bilirubin Urine: NEGATIVE
Glucose, UA: NEGATIVE mg/dL
Ketones, ur: 20 mg/dL — AB
Leukocytes,Ua: NEGATIVE
Nitrite: NEGATIVE
Protein, ur: 30 mg/dL — AB
RBC / HPF: >50 RBC/hpf (ref 0–5)
Specific Gravity, Urine: 1.033 — ABNORMAL HIGH (ref 1.005–1.030)
pH: 5 (ref 5.0–8.0)

## 2023-04-21 LAB — BASIC METABOLIC PANEL
Anion gap: 13 (ref 5–15)
BUN: 30 mg/dL — ABNORMAL HIGH (ref 6–20)
CO2: 21 mmol/L — ABNORMAL LOW (ref 22–32)
Calcium: 8.8 mg/dL — ABNORMAL LOW (ref 8.9–10.3)
Chloride: 109 mmol/L (ref 98–111)
Creatinine, Ser: 0.79 mg/dL (ref 0.61–1.24)
GFR, Estimated: >60 mL/min (ref >60)
Glucose, Bld: 102 mg/dL — ABNORMAL HIGH (ref 70–99)
Potassium: 3.2 mmol/L — ABNORMAL LOW (ref 3.5–5.1)
Sodium: 143 mmol/L (ref 135–145)

## 2023-04-21 LAB — GLUCOSE, CAPILLARY
Glucose-Capillary: 111 mg/dL — ABNORMAL HIGH (ref 70–99)
Glucose-Capillary: 94 mg/dL (ref 70–99)
Glucose-Capillary: 94 mg/dL (ref 70–99)
Glucose-Capillary: 102 mg/dL — ABNORMAL HIGH (ref 70–99)

## 2023-04-21 LAB — CK: Total CK: 1006 U/L — ABNORMAL HIGH (ref 49–397)

## 2023-04-21 LAB — CBC
HCT: 49.5 % (ref 39.0–52.0)
Hemoglobin: 16.9 g/dL (ref 13.0–17.0)
MCH: 32.1 pg (ref 26.0–34.0)
MCHC: 34.1 g/dL (ref 30.0–36.0)
MCV: 94.1 fL (ref 80.0–100.0)
Platelets: 237 10*3/uL (ref 150–400)
RBC: 5.26 MIL/uL (ref 4.22–5.81)
RDW: 14.6 % (ref 11.5–15.5)
WBC: 8.5 10*3/uL (ref 4.0–10.5)
nRBC: 0 % (ref 0.0–0.2)

## 2023-04-21 LAB — BLOOD GAS, ARTERIAL
Acid-Base Excess: 4.5 mmol/L — ABNORMAL HIGH (ref 0.0–2.0)
Bicarbonate: 26.5 mmol/L (ref 20.0–28.0)
O2 Saturation: 97.3 %
Patient temperature: 37
pCO2 arterial: 31 mm[Hg] — ABNORMAL LOW (ref 32–48)
pH, Arterial: 7.54 — ABNORMAL HIGH (ref 7.35–7.45)
pO2, Arterial: 75 mm[Hg] — ABNORMAL LOW (ref 83–108)

## 2023-04-21 LAB — AMMONIA: Ammonia: 34 umol/L (ref 9–35)

## 2023-04-21 LAB — VALPROIC ACID LEVEL: Valproic Acid Lvl: 29 ug/mL — ABNORMAL LOW (ref 50.0–100.0)

## 2023-04-21 LAB — LACTIC ACID, PLASMA: Lactic Acid, Venous: 1.7 mmol/L (ref 0.5–1.9)

## 2023-04-21 MED ORDER — ACETAMINOPHEN 650 MG RE SUPP
650.0000 mg | Freq: Once | RECTAL | Status: AC
  Administered 2023-04-21: 650 mg via RECTAL

## 2023-04-21 MED ORDER — BISACODYL 10 MG RE SUPP
10.0000 mg | Freq: Once | RECTAL | Status: AC
  Administered 2023-04-21: 10 mg via RECTAL
  Filled 2023-04-21: qty 1

## 2023-04-21 MED ORDER — DEXTROSE 50 % IV SOLN: 12.5000 g | INTRAVENOUS | Status: AC

## 2023-04-21 MED ORDER — SODIUM CHLORIDE 0.9 % IV BOLUS
1000.0000 mL | Freq: Once | INTRAVENOUS | Status: AC
  Administered 2023-04-21: 1000 mL via INTRAVENOUS

## 2023-04-21 MED ORDER — LACTATED RINGERS IV SOLN: INTRAVENOUS | Status: DC

## 2023-04-21 MED ORDER — POTASSIUM CHLORIDE 10 MEQ/100ML IV SOLN
10.0000 meq | INTRAVENOUS | Status: AC
  Administered 2023-04-21 (×3): 10 meq via INTRAVENOUS
  Filled 2023-04-21: qty 100

## 2023-04-21 MED ORDER — DEXTROSE-SODIUM CHLORIDE 5-0.45 % IV SOLN: INTRAVENOUS | Status: DC

## 2023-04-21 NOTE — Significant Event (Signed)
Approximately 0930-- This RN entered pt's room to complete AM assessment. Pt appeared diaphoretic, had BUE tremors, and tachypneic. Full set of VS taken by Alycia Rossetti, NT-- pt currently a Red MEWS. MD aware of pt's Red MEWS this AM. Pt not responsive to verbal stimuli, minimally responsive to painful stimuli. Charge RN, Ale, assessed pt at bedside. RRT called d/t pt's current clinical presentation.

## 2023-04-21 NOTE — Plan of Care (Signed)
  Problem: Clinical Measurements: Goal: Diagnostic test results will improve Outcome: Not Progressing Goal: Signs and symptoms of infection will decrease Outcome: Not Progressing   Problem: Respiratory: Goal: Ability to maintain adequate ventilation will improve Outcome: Not Progressing   Problem: Clinical Measurements: Goal: Ability to maintain clinical measurements within normal limits will improve Outcome: Not Progressing

## 2023-04-21 NOTE — Plan of Care (Signed)
Patient not oriented at all, X 0 and unable to follow instructions and appears nonverbal, noted singing out in AM.Patient is a complete, and incontinent of bowel/bladder, with male purewick in place. Bathed overnight and repositioned q2 hours.Patient was unable to take pills over night, observed pocketing pills and applesause, alerted DR Modou Jawo over night.

## 2023-04-21 NOTE — Progress Notes (Signed)
  Chaplain On-Call responded to Rapid Response notification at 0925 hours.  The medical team was providing bedside care for the patient, whom they described as non-verbal and non-responsive.  Unit Nurse Director stated that the patient is from a Group Home. No family is present.  Chaplain assured Staff of availability as needed.  Chaplain Morene Crocker., [facility]

## 2023-04-21 NOTE — Progress Notes (Signed)
Eeg done 

## 2023-04-21 NOTE — Progress Notes (Signed)
Progress Note   Patient: Jared Mayer BJY:[id number] DOB: Mar 23, 1971 DOA: 04/08/2023     13 DOS: the patient was seen and examined on 04/21/2023   Brief hospital course: Jared Mayer is a 52 y.o. male with medical history significant for intellectual disability, schizoaffective disorder, dyslipidemia, drug-induced parkinsonism, hypothyroidism, presented to ED for evaluation of altered mental status, disorientation, repetitive speech pattern, erratic behavior and recurrent falls.  Reportedly, he had been more aggressive at the group home than usual admitted to hospitalist service for further management evaluation of altered mental status.  Assessment and Plan: Acute metabolic encephalopathy Baseline intellectual disability, schizoaffective disorder.  Patient usually able to respond.  Today he is more lethargic and sleepy, unable to wake him with even with painful stimuli.  Continue supportive care.  EEG ordered. CT head from yesterday unremarkable. N.p.o. due to risk of aspiration. Stat ABG, chest x-ray, UA ordered.  Febrile illness, Tachycardia. Meets SIRS criteria. Suspicion for infection is low at this time given CT chest, abdomen pelvis did not reveal source of infection, UA unremarkable, lactic acid is within normal limits.  Patient is seen by ID who recommended to discontinue IV antibiotics and continue to monitor fever trend. Repeat blood cultures, UA, chest x-ray ordered. Patient's hyperthermia likely due to possible use of antipsychotic medications Depakote, Lamictal, and Topamax.  CK level 1500.  Continue to hold all antipsychotic medications.  Patient is at high risk for neuroleptic malignant syndrome, serotonin syndrome. A bolus of normal saline given.  Continue gentle IV hydration. Continue to monitor electrolytes and replete accordingly.  Rhabdomyolysis Elevated LFT In the setting of use of antipsychotic medications.  Hold off antipsychotics, hepatotoxic medications.  Continue  to trend LFTs, CK..  Schizoaffective disorder: Psychiatry evaluation called for further management.  Hold off on oral medications at this time. Keep him n.p.o. gentle IV hydration. Supportive care.    Out of bed to chair. Incentive spirometry. Nursing supportive care. Fall, aspiration precautions. DVT prophylaxis   Code Status: Full Code  Subjective: Patient is seen and examined today morning.  Rapid response was called due to patient's lethargic, febrile illness, Mews score high.  He is unable to wake up with pain stimuli.  RN at bedside noticed that he is diaphoretic this morning.  He remained NPO.  Stat labs, radiology studies ordered.  Physical Exam: Vitals:   04/21/23 0420 04/21/23 0659 04/21/23 0911 04/21/23 1159  BP: (!) 143/79 138/85 139/87 130/85  Pulse: (!) 125 (!) 128 (!) 129 (!) 119  Resp: (!) 30  (!) 38 (!) 30  Temp: (!) 101.4 F (38.6 C) 98.4 F (36.9 C) (!) 102.8 F (39.3 C) 99.4 F (37.4 C)  TempSrc: Oral Oral Oral Axillary  SpO2: 96% 93% 95% 95%  Weight:        General -Middle aged Caucasian male, sleepy, lethargic HEENT - PERRLA, EOMI, atraumatic head, non tender sinuses. Lung - Clear, bibasal rales, rhonchi, no wheezes. Heart - S1, S2 heard, no murmurs, rubs, no pedal edema. Abdomen - Soft, non tender, bowel sounds good Neuro - Sleepy, lethargic, unable to do full neuroexam.. Skin - Warm and dry.  Data Reviewed:      Latest Ref Rng & Units 04/21/2023   10:21 AM 04/20/2023    4:05 AM 04/18/2023   10:34 AM  CBC  WBC 4.0 - 10.5 K/uL 8.5  5.2  5.3   Hemoglobin 13.0 - 17.0 g/dL 08.6  57.8  46.9   Hematocrit 39.0 - 52.0 % 49.5  44.9  45.4  Platelets 150 - 400 K/uL 237  238  306       Latest Ref Rng & Units 04/21/2023   10:21 AM 04/20/2023    4:05 AM 04/18/2023   10:34 AM  BMP  Glucose 70 - 99 mg/dL 161  86  096   BUN 6 - 20 mg/dL 30  27  32   Creatinine 0.61 - 1.24 mg/dL 0.45  4.09    8.11  9.14   Sodium 135 - 145 mmol/L 143  140  140    Potassium 3.5 - 5.1 mmol/L 3.2  3.8  3.9   Chloride 98 - 111 mmol/L 109  108  105   CO2 22 - 32 mmol/L 21  21  23    Calcium 8.9 - 10.3 mg/dL 8.8  8.3  8.9    DG Chest Port 1 View  Result Date: 04/21/2023 CLINICAL DATA:  Tachycardia tachypnea EXAM: PORTABLE CHEST - 1 VIEW COMPARISON:  04/18/2023 FINDINGS: Cardiomediastinal silhouette and pulmonary vasculature are within normal limits. Lungs are clear. IMPRESSION: No acute cardiopulmonary process. Electronically Signed   By: Acquanetta Belling M.D.   On: 04/21/2023 11:27   CT HEAD WO CONTRAST ( )  Result Date: 04/20/2023 CLINICAL DATA:  Altered mental status. EXAM: CT HEAD WITHOUT CONTRAST TECHNIQUE: Contiguous axial images were obtained from the base of the skull through the vertex without intravenous contrast. RADIATION DOSE REDUCTION: This exam was performed according to the departmental dose-optimization program which includes automated exposure control, adjustment of the mA and/or kV according to patient size and/or use of iterative reconstruction technique. COMPARISON:  Head CT 04/03/2023. FINDINGS: Brain: No acute intracranial hemorrhage. Gray-white differentiation is preserved. No hydrocephalus or extra-axial collection. No mass effect or midline shift. Vascular: No hyperdense vessel or unexpected calcification. Skull: No calvarial fracture or suspicious bone lesion. Skull base is unremarkable. Sinuses/Orbits: No acute finding. Other: None. IMPRESSION: No acute intracranial abnormality. Electronically Signed   By: Orvan Falconer M.D.   On: 04/20/2023 11:44     Family Communication: Discussed with mother over phone, she understand and agree. All questions answereed.  Disposition: Status is: Inpatient Remains inpatient appropriate because: Fever, AMS work up.  Planned Discharge Destination: Skilled nursing facility     MDM level 3- Patient is very sick, spiking fever, altered, unknown etiology He has been altered since last 2 days, with  fever no source of infection found. He will need psychiatric input for further management.  Patient needs close hemodynamic, neurologic, telemetry monitoring.  Patient is at high risk for sudden clinical deterioration.  Author: Marcelino Duster, MD 04/21/2023 2:26 PM Secure chat 7am to 7pm For on call review [email or web address].

## 2023-04-21 NOTE — Procedures (Signed)
Patient Name: Niam Chasteen  MRN: [id number]  Epilepsy Attending: Charlsie Quest  Referring Physician/Provider: Marcelino Duster, MD  Date: 04/21/2023 Duration: 26.13 mins  Patient history: 52 yo M with ams getting eeg to evaluate for seizure  Level of alertness: Awake  AEDs during EEG study: LTG, TPM  Technical aspects: This EEG study was done with scalp electrodes positioned according to the 10-20 International system of electrode placement. Electrical activity was reviewed with band pass filter of 1-70Hz , sensitivity of 7 uV/mm, display speed of 67mm/sec with a 60Hz  notched filter applied as appropriate. EEG data were recorded continuously and digitally stored.  Video monitoring was available and reviewed as appropriate.  Description: The posterior dominant rhythm consists of 8 Hz activity of moderate voltage (25-35 uV) seen predominantly in posterior head regions, symmetric and reactive to eye opening and eye closing. Hyperventilation and photic stimulation were not performed.     IMPRESSION: This study is within normal limits. No seizures or epileptiform discharges were seen throughout the recording.  A normal interictal EEG does not exclude the diagnosis of epilepsy.  Ryllie Nieland [name]

## 2023-04-21 NOTE — Progress Notes (Addendum)
Rapid Response Event Note   Reason for Call : Altered mental status and tachycardia   Initial Focused Assessment: upon assessment patient lethargic but responsive to painful stimuli, tachycardic via monitor 120's and tachypnic.       Interventions: vital signs obtained. BP 122/48, RR 28, HR 121, Axillary temperature 102, 95% on room air.  Dr. Clide Dales paged and notified.    Plan of Care: MD ordered CBC, Met B, lactic, CXR, blood cultures, UA, EEG, neurology consult and one time order for rectal tylenol.    Event Summary: Patient remained in room. No need for higher level of care at this time.   MD Notified: 09:33 Call Time:09:26 Arrival Time:09:30 End Time:09:45  Kelby Fam, RN

## 2023-04-21 NOTE — Consult Note (Addendum)
Pt chart reviewed and assessed face to face. I had seen pt on 04/05/23 in the emergency department. Currently pt is significantly decompensated from that presentation. Pt appears uncomfortable, face is contorted in pained expression and extremities are rigid. Pt does not acknowledge my presence or respond to any assessment questions.   Case staffed with attending psychiatrist, Dr. 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 She verbalized understanding.

## 2023-04-21 NOTE — Procedures (Signed)
Will have to do eeg later-have another pt to do asap

## 2023-04-21 NOTE — Progress Notes (Addendum)
SLP Cancellation Note  Patient Details Name: Jared Mayer MRN: [id number] DOB: [birth year]   Cancelled treatment:       Reason Eval/Treat Not Completed: Fatigue/lethargy limiting ability to participate;Patient's level of consciousness;Patient not medically ready (chart reviewed; consulted NSG then consulted MD via secure chat)   Pt has a Baseline of Lewy body Dementia, erratic behavior/encephalopathy, dependency on being fed, and current, lengthy illness/hospitalization. W/ this presentation and Baseline, he is ALWAYS at increased risk for aspiration. Pt also has has Dysphagia Baseline (documented in 2020 c/b IMPULSIVE eating/drinking behaviors and decreased awareness), and he needs 100% Supervision w/ all oral intake. Speech first did his Eval on 04/10/23 at admit, and he appeared functional for a dysphagia level 3 diet w/ thins w/ minced meats; appeared at HIS Baseline. WBC was good, Afebrile, on RA, awake/alert. He was getting 100% Supervision w/ po's and Pills CRUSHED in puree then, as rec'd.  W/ this current decline in medical status in the past ~2 days, and not alerting/awaking at this time, definitely recommend NPO status including meds per new MD order today. Of Note: a rapid response was called by NSG this morning d/t pt's decreased alerting. Recommend oral care when pt is awake to participate. Speech therapy will f/u tomorrow w/ repeat Eval if pt can alert/awake appropriately. NSG agreed.       Jerilynn Som, MS, CCC-SLP Speech Language Pathologist Rehab Services; [facility] [phone] (ascom) Danaja Lasota 04/21/2023, 11:07 AM

## 2023-04-21 NOTE — Progress Notes (Signed)
MEWS Progress Note  Patient Details Name: Jared Mayer MRN: [id number] DOB: 1971-05-03 Today's Date: 04/21/2023   MEWS Flowsheet Documentation:  Assess: MEWS Score Temp: 99.4 F (37.4 C) BP: 130/85 MAP (mmHg): 100 Pulse Rate: (!) 119 ECG Heart Rate: (!) 116 Resp: (!) 30 Level of Consciousness: Responds to Pain SpO2: 95 % O2 Device: Room Air Patient Activity (if Appropriate): In bed Assess: MEWS Score MEWS Temp: 0 MEWS Systolic: 0 MEWS Pulse: 2 MEWS RR: 2 MEWS LOC: 2 MEWS Score: 6 MEWS Score Color: Red Assess: SIRS CRITERIA SIRS Temperature : 0 SIRS Respirations : 1 SIRS Pulse: 1 SIRS WBC: 0 SIRS Score Sum : 2 SIRS Temperature : 0 SIRS Pulse: 1 SIRS Respirations : 1 SIRS WBC: 0 SIRS Score Sum : 2 Assess: if the MEWS score is Yellow or Red Were vital signs accurate and taken at a resting state?: Yes Does the patient meet 2 or more of the SIRS criteria?: Yes Does the patient have a confirmed or suspected source of infection?: No MEWS guidelines implemented : Yes, red Treat MEWS Interventions: Considered administering scheduled or prn medications/treatments as ordered Take Vital Signs Increase Vital Sign Frequency : Red: Q1hr x2, continue Q4hrs until patient remains green for 12hrs Escalate MEWS: Escalate: Red: Discuss with charge nurse and notify provider. Consider notifying RRT. If remains red for 2 hours consider need for higher level of care        Mathis Fare 04/21/2023, 3:42 PM

## 2023-04-22 ENCOUNTER — Inpatient Hospital Stay: Payer: Medicare HMO

## 2023-04-22 DIAGNOSIS — F419 Anxiety disorder, unspecified: Secondary | ICD-10-CM | POA: Diagnosis not present

## 2023-04-22 DIAGNOSIS — G210 Malignant neuroleptic syndrome: Secondary | ICD-10-CM

## 2023-04-22 DIAGNOSIS — M6282 Rhabdomyolysis: Secondary | ICD-10-CM | POA: Diagnosis not present

## 2023-04-22 DIAGNOSIS — R410 Disorientation, unspecified: Secondary | ICD-10-CM | POA: Diagnosis not present

## 2023-04-22 DIAGNOSIS — F250 Schizoaffective disorder, bipolar type: Secondary | ICD-10-CM | POA: Diagnosis not present

## 2023-04-22 DIAGNOSIS — R400 Somnolence: Secondary | ICD-10-CM | POA: Diagnosis not present

## 2023-04-22 LAB — COMPREHENSIVE METABOLIC PANEL
ALT: 39 U/L (ref 0–44)
AST: 54 U/L — ABNORMAL HIGH (ref 15–41)
Albumin: 2.9 g/dL — ABNORMAL LOW (ref 3.5–5.0)
Alkaline Phosphatase: 55 U/L (ref 38–126)
Anion gap: 6 (ref 5–15)
BUN: 25 mg/dL — ABNORMAL HIGH (ref 6–20)
CO2: 24 mmol/L (ref 22–32)
Calcium: 8.2 mg/dL — ABNORMAL LOW (ref 8.9–10.3)
Chloride: 111 mmol/L (ref 98–111)
Creatinine, Ser: 0.61 mg/dL (ref 0.61–1.24)
GFR, Estimated: >60 mL/min (ref >60)
Glucose, Bld: 133 mg/dL — ABNORMAL HIGH (ref 70–99)
Potassium: 3.4 mmol/L — ABNORMAL LOW (ref 3.5–5.1)
Sodium: 141 mmol/L (ref 135–145)
Total Bilirubin: 0.7 mg/dL (ref <1.2)
Total Protein: 5.9 g/dL — ABNORMAL LOW (ref 6.5–8.1)

## 2023-04-22 LAB — GLUCOSE, CAPILLARY
Glucose-Capillary: 132 mg/dL — ABNORMAL HIGH (ref 70–99)
Glucose-Capillary: 149 mg/dL — ABNORMAL HIGH (ref 70–99)
Glucose-Capillary: 150 mg/dL — ABNORMAL HIGH (ref 70–99)
Glucose-Capillary: 183 mg/dL — ABNORMAL HIGH (ref 70–99)
Glucose-Capillary: 134 mg/dL — ABNORMAL HIGH (ref 70–99)

## 2023-04-22 LAB — CBC
HCT: 46.2 % (ref 39.0–52.0)
Hemoglobin: 15.5 g/dL (ref 13.0–17.0)
MCH: 32.3 pg (ref 26.0–34.0)
MCHC: 33.5 g/dL (ref 30.0–36.0)
MCV: 96.3 fL (ref 80.0–100.0)
Platelets: 216 10*3/uL (ref 150–400)
RBC: 4.8 MIL/uL (ref 4.22–5.81)
RDW: 14.3 % (ref 11.5–15.5)
WBC: 13.3 10*3/uL — ABNORMAL HIGH (ref 4.0–10.5)
nRBC: 0 % (ref 0.0–0.2)

## 2023-04-22 LAB — PHOSPHORUS
Phosphorus: 2.4 mg/dL — ABNORMAL LOW (ref 2.5–4.6)
Phosphorus: 2.2 mg/dL — ABNORMAL LOW (ref 2.5–4.6)

## 2023-04-22 LAB — MAGNESIUM
Magnesium: 2.1 mg/dL (ref 1.7–2.4)
Magnesium: 2.4 mg/dL (ref 1.7–2.4)
Magnesium: 2.4 mg/dL (ref 1.7–2.4)

## 2023-04-22 LAB — CK: Total CK: 619 U/L — ABNORMAL HIGH (ref 49–397)

## 2023-04-22 LAB — LACTIC ACID, PLASMA: Lactic Acid, Venous: 1.3 mmol/L (ref 0.5–1.9)

## 2023-04-22 MED ORDER — LORAZEPAM 2 MG/ML IJ SOLN
1.0000 mg | Freq: Once | INTRAMUSCULAR | Status: AC
  Administered 2023-04-22: 1 mg via INTRAVENOUS
  Filled 2023-04-22: qty 1

## 2023-04-22 MED ORDER — LEVOTHYROXINE SODIUM 125 MCG PO TABS
125.0000 ug | ORAL_TABLET | Freq: Every day | ORAL | Status: DC
  Administered 2023-04-23 – 2023-05-11 (×16): 125 ug via NASOGASTRIC
  Filled 2023-04-22 (×19): qty 1

## 2023-04-22 MED ORDER — EMPTY CONTAINERS FLEXIBLE MISC: 1.0000 mg/kg | Freq: Four times a day (QID) | Status: DC

## 2023-04-22 MED ORDER — DANTROLENE SODIUM 250 MG IV SUSR: 2.5000 mg/kg | INTRAVENOUS | Status: DC

## 2023-04-22 MED ORDER — OSMOLITE 1.5 CAL PO LIQD
1000.0000 mL | ORAL | Status: DC
Start: 2023-04-22 — End: 2023-05-06
  Administered 2023-04-22 – 2023-05-05 (×10): 1000 mL

## 2023-04-22 MED ORDER — MIDODRINE HCL 5 MG PO TABS
2.5000 mg | ORAL_TABLET | Freq: Two times a day (BID) | ORAL | Status: DC
  Administered 2023-04-22 – 2023-05-01 (×17): 2.5 mg via NASOGASTRIC
  Filled 2023-04-22 (×16): qty 1

## 2023-04-22 MED ORDER — THIAMINE MONONITRATE 100 MG PO TABS
100.0000 mg | ORAL_TABLET | Freq: Every day | ORAL | Status: AC
  Administered 2023-04-22 – 2023-04-28 (×7): 100 mg
  Filled 2023-04-22 (×7): qty 1

## 2023-04-22 MED ORDER — FREE WATER
30.0000 mL | Status: DC
  Administered 2023-04-22 – 2023-04-28 (×35): 30 mL

## 2023-04-22 MED ORDER — LACTATED RINGERS IV SOLN: INTRAVENOUS | Status: AC

## 2023-04-22 MED ORDER — PROSOURCE TF20 ENFIT COMPATIBL EN LIQD
60.0000 mL | Freq: Two times a day (BID) | ENTERAL | Status: DC
  Administered 2023-04-22 – 2023-05-11 (×37): 60 mL
  Filled 2023-04-22 (×12): qty 60

## 2023-04-22 MED ORDER — BROMOCRIPTINE MESYLATE 2.5 MG PO TABS
2.5000 mg | ORAL_TABLET | Freq: Three times a day (TID) | ORAL | Status: DC
  Administered 2023-04-22 – 2023-04-23 (×3): 2.5 mg via NASOGASTRIC
  Filled 2023-04-22 (×4): qty 1

## 2023-04-22 MED ORDER — POTASSIUM CHLORIDE 20 MEQ PO PACK
40.0000 meq | PACK | Freq: Once | ORAL | Status: AC
  Administered 2023-04-22: 40 meq
  Filled 2023-04-22: qty 2

## 2023-04-22 MED ORDER — ADULT MULTIVITAMIN W/MINERALS CH
1.0000 | ORAL_TABLET | Freq: Every day | ORAL | Status: DC
  Administered 2023-04-22 – 2023-05-11 (×19): 1
  Filled 2023-04-22 (×19): qty 1

## 2023-04-22 MED ORDER — ACETAMINOPHEN 160 MG/5ML PO SOLN
975.0000 mg | Freq: Four times a day (QID) | ORAL | Status: DC
Start: 2023-04-22 — End: 2023-05-02
  Administered 2023-04-22 – 2023-05-02 (×37): 975 mg
  Filled 2023-04-22 (×40): qty 40.6

## 2023-04-22 MED ORDER — PIVOT 1.5 CAL PO LIQD: 1000.0000 mL | ORAL | Status: DC

## 2023-04-22 MED ORDER — K PHOS MONO-SOD PHOS DI & MONO 155-852-130 MG PO TABS
500.0000 mg | ORAL_TABLET | Freq: Every day | ORAL | Status: DC
Start: 2023-04-22 — End: 2023-04-23
  Administered 2023-04-22: 500 mg via NASOGASTRIC
  Filled 2023-04-22 (×2): qty 2

## 2023-04-22 NOTE — Progress Notes (Signed)
Charge RN attempted NGT to right and left nares. Due to patient's mental disability, patient unable to tolerate NGT placement at bedside. Patient's RN updated and MD notified.   Madie Reno, RN

## 2023-04-22 NOTE — Consult Note (Signed)
PHARMACY CONSULT NOTE - ELECTROLYTES  Pharmacy Consult for Electrolyte Monitoring and Replacement   Recent Labs: Weight: 77.4 kg (170 lb 10.2 oz) Estimated Creatinine Clearance: 104.5 mL/min (by C-G formula based on SCr of 0.61 mg/dL). Potassium (mmol/L)  Date Value  04/22/2023 3.4 (L)   Magnesium (mg/dL)  Date Value  16/02/9603 2.1   Calcium (mg/dL)  Date Value  54/01/8118 8.2 (L)   Albumin (g/dL)  Date Value  14/78/2956 2.9 (L)   Phosphorus (mg/dL)  Date Value  21/30/8657 2.9   Sodium (mmol/L)  Date Value  04/22/2023 141   Corrected Ca: 9.1 mg/dL  Assessment  Jared Mayer is a 52 y.o. male presenting with Neuroleptic Malignant Syndrome. PMH significant for schizoaffective disorder, HLD, hypothyroidism, and intellectual disability. Pharmacy has been consulted to monitor and replace electrolytes.  Diet: NPO with possibly tube feeding starting soon MIVF: D5 1/2 NS @ 125 mL/hr  Goal of Therapy: Electrolytes WNL  Plan:  K = 3.4, order placed for Kcl 40 mEq per NG x 1 Check BMP, Mg, Phos with AM labs  Thank you for allowing pharmacy to be a part of this patient's care.  Barrie Folk, PharmD Clinical Pharmacist 04/22/2023 12:39 PM

## 2023-04-22 NOTE — Progress Notes (Addendum)
Progress Note   Patient: Jared Mayer ZOX:[id number] DOB: 14-Oct-1970 DOA: 04/08/2023     14 DOS: the patient was seen and examined on 04/22/2023   Brief hospital course: Reagen Longenberger is a 52 y.o. male with medical history significant for intellectual disability, schizoaffective disorder, dyslipidemia, drug-induced parkinsonism, hypothyroidism, presented to ED for evaluation of altered mental status, disorientation, repetitive speech pattern, erratic behavior and recurrent falls.  Reportedly, he had been more aggressive at the group home than usual admitted to hospitalist service for further management evaluation of altered mental status.  Assessment and Plan: Acute metabolic encephalopathy Baseline intellectual disability, schizoaffective disorder.  Patient usually able to respond.  He has been more lethargic and sleepy, unable to wake him with even with painful stimuli.  Continue supportive care.  EEG no epileptiform discharges. High suspicion for NMS. Continue supportive care. NG tube for feeds and meds ordered. Discussed with ICU MD who agree presentation consistent with NMS. I will discuss with mother today in person if she is at bedside.  Febrile illness, Tachycardia. SIRS High Suspicion for Neurolept malignant syndrome Suspicion for infection is low at this time given CT chest, abdomen pelvis did not reveal source of infection, UA unremarkable, lactic acid is within normal limits.  Patient is seen by ID who recommended to discontinue IV antibiotics and continue to monitor fever trend. Repeat blood cultures, UA, chest x-ray ordered. CK level 1500 - 1000 -619 today.  Continue to hold all antipsychotic medications.   NMS in the setting of use of antipsychotic medications Loxapine. Continue to monitor electrolytes and replete accordingly. Continue gentle IV hydration. Cooling blanket for high grade fever. PCCM consulted, who advised supportive therapy and start Bromocriptine through NG  tube. NG tube for feeds, meds ordered.  Rhabdomyolysis Elevated LFT In the setting of use of antipsychotic medications, NMS.  Hold off antipsychotics, hepatotoxic medications. LFT, CK improving. Continue to IV hydration.  Schizoaffective disorder: Psychiatry evaluation appreciated. Hold current antipsychotics. Valproic acid level lower side, ammonia 34.    Out of bed to chair. Incentive spirometry. Nursing supportive care. Fall, aspiration precautions. DVT prophylaxis   Code Status: Full Code  Subjective: Patient is seen and examined today morning. Still continues to spike fever. Temp today morning 103. He is not arousable. PCCM consulted for NMS.  Physical Exam: Vitals:   04/21/23 2022 04/22/23 0311 04/22/23 0519 04/22/23 0908  BP: 136/73 (!) 145/83 135/78 132/76  Pulse: (!) 122 (!) 123 (!) 120 (!) 126  Resp: (!) 30   (!) 38  Temp: 99.1 F (37.3 C) 99.3 F (37.4 C) 99.1 F (37.3 C) (!) 103 F (39.4 C)  TempSrc: Oral Oral Oral Oral  SpO2: 92% 94% 95% 95%  Weight:       General -Middle aged Caucasian male, sleepy, lethargic HEENT - PERRLA, EOMI, atraumatic head, non tender sinuses. Lung - Clear, bibasal rales, rhonchi, no wheezes. Heart - S1, S2 heard, no murmurs, rubs, no pedal edema. Abdomen - Soft, non tender, bowel sounds good Neuro - Sleepy, lethargic, unable to do full neuroexam.. Skin - Warm and dry.  Data Reviewed:      Latest Ref Rng & Units 04/22/2023    6:34 AM 04/21/2023   10:21 AM 04/20/2023    4:05 AM  CBC  WBC 4.0 - 10.5 K/uL 13.3  8.5  5.2   Hemoglobin 13.0 - 17.0 g/dL 81.1  91.4  78.2   Hematocrit 39.0 - 52.0 % 46.2  49.5  44.9   Platelets 150 - 400  K/uL 216  237  238       Latest Ref Rng & Units 04/22/2023    6:34 AM 04/21/2023   10:21 AM 04/20/2023    4:05 AM  BMP  Glucose 70 - 99 mg/dL 409  811  86   BUN 6 - 20 mg/dL 25  30  27    Creatinine 0.61 - 1.24 mg/dL 9.14  7.82  9.56    2.13   Sodium 135 - 145 mmol/L 141  143  140    Potassium 3.5 - 5.1 mmol/L 3.4  3.2  3.8   Chloride 98 - 111 mmol/L 111  109  108   CO2 22 - 32 mmol/L 24  21  21    Calcium 8.9 - 10.3 mg/dL 8.2  8.8  8.3    EEG adult Result Date: 04/21/2023 Charlsie Quest, MD     04/21/2023  5:44 PM Patient Name: Jared Mayer MRN: [id number] Epilepsy Attending: Charlsie Quest Referring Physician/Provider: Marcelino Duster, MD Date: 04/21/2023 Duration: 26.13 mins Patient history: 52 yo M with ams getting eeg to evaluate for seizure Level of alertness: Awake AEDs during EEG study: LTG, TPM Technical aspects: This EEG study was done with scalp electrodes positioned according to the 10-20 International system of electrode placement. Electrical activity was reviewed with band pass filter of 1-70Hz , sensitivity of 7 uV/mm, display speed of 53mm/sec with a 60Hz  notched filter applied as appropriate. EEG data were recorded continuously and digitally stored.  Video monitoring was available and reviewed as appropriate. Description: The posterior dominant rhythm consists of 8 Hz activity of moderate voltage (25-35 uV) seen predominantly in posterior head regions, symmetric and reactive to eye opening and eye closing. Hyperventilation and photic stimulation were not performed.   IMPRESSION: This study is within normal limits. No seizures or epileptiform discharges were seen throughout the recording. A normal interictal EEG does not exclude the diagnosis of epilepsy. Charlsie Quest   DG Chest Port 1 View Result Date: 04/21/2023 CLINICAL DATA:  Tachycardia tachypnea EXAM: PORTABLE CHEST - 1 VIEW COMPARISON:  04/18/2023 FINDINGS: Cardiomediastinal silhouette and pulmonary vasculature are within normal limits. Lungs are clear. IMPRESSION: No acute cardiopulmonary process. Electronically Signed   By: Acquanetta Belling M.D.   On: 04/21/2023 11:27   Family Communication: Plan to meet his mother at bedside today. Will discuss current clinical condition and  prognosis.  Disposition: Status is: Inpatient Remains inpatient appropriate because: Fever, AMS work up.  Planned Discharge Destination: Skilled nursing facility   Discussed with mother who works as ED Charity fundraiser. He has been in a facility for last 25 years struggling with mental health issues. She understands poor prognosis, requested palliative consult for goals of care discussion.  MDM level 3- Patient is very sick, spiking fever, altered, unknown etiology  High suspicion for NMS.  Patient needs ICU consult for NMS, close hemodynamic, neurologic, telemetry monitoring.  Patient is at high risk for sudden clinical deterioration.  Author: Marcelino Duster, MD 04/22/2023 11:39 AM Secure chat 7am to 7pm For on call review [email or web address].

## 2023-04-22 NOTE — Progress Notes (Signed)
Initial Nutrition Assessment  DOCUMENTATION CODES:   Not applicable  INTERVENTION:   -Once NGT is placed and cleared for use:   Initiate Osmolite 1.5 @ 20 ml/hr and increase by 10 ml every 8 hours to goal rate of 50 ml/hr.   60 ml Prosource TF BID  30 ml free water flush every 4 hours to maintain tube patency  Tube feeding regimen provides 1960 kcal (100% of needs), 115 grams of protein, and 912 ml of H2O. Total free water: 1092 ml daily  -MVI with minerals daily via tube -100 mg thiamine daily x 7 days via tube -Monitor Mg, K, and Phos and replete as needed secondary to high refeeding risk; case discussed with RN and MD- requested pharmacy consult for electrolyte management which has been placed per MD  NUTRITION DIAGNOSIS:   Inadequate oral intake related to inability to eat as evidenced by NPO status.  GOAL:   Patient will meet greater than or equal to 90% of their needs  MONITOR:   Diet advancement, TF tolerance  REASON FOR ASSESSMENT:   Consult Enteral/tube feeding initiation and management  ASSESSMENT:   Pt with medical history significant for schizoaffective disorder, bipolar type, dyslipidemia, drug-induced parkinsonism, and hypothyroidism, who presented with acute onset of altered mental status with recent recurrent falls and unsteady gait with worsening disorientation and repetitive speech pattern as well as erratic behavior.  Pt admitted with AMS and rhabdomyolysis (associated with frequent falls).   11/30- s/p BSE- advanced diet to dysphagia 3 diet with thin liquids  12/11- s/p BSE- downgraded to NPO secondary to mental status, s/p EEG- WDL  Per PCCM notes, plan to trial IV lorazepam for catatonia; presentation consistent with NMS. Per MD , plan for NGT placement today for medications and feedings.  ID also following and suspects medications may be contributing to mental status changes.    Pt lying in bed at time of visit. He did not respond to voice or  touch. No family present to obtain further history.   Per chart review, lethargy began on 04/18/23, which worsened on 12/9-12/10/24 with inability to take PO's or oral meds. Per discussion with SLP, pt unable to take PO's safely. MD ordered NGT for nutrition and medication management. Noted orders for NGT placement and x-ray verification for placement prior to using.   Reviewed wt hx; pt has experienced a 5.1% wt loss over the past 5 months, which is not significant for time frame.   Given little to no oral intake over the past 4 days, pt is at high risk for refeeding syndrome. Case discussed with MD and RN; requested pharmacy consult for electrolyte management.   Per TOC notes, pt is from [facility]. Pt will require a higher level of care at discharge; [facility] working on SNF placement. Noted pt was recently diagnosed with dementia PTA and had recently developed aggressive behaviors at facility.   Medications reviewed and include colace, lovenox, lactulose, ativan, protonix, miralax, vitamin D, and dextrose 5% and 0.45% NaCl infusion @ 125 ml/hr.   Labs reviewed: K: 3.4, CBGS: 94-150 (inpatient orders for glycemic control are none).    NUTRITION - FOCUSED PHYSICAL EXAM:  Flowsheet Row Most Recent Value  Orbital Region No depletion  Upper Arm Region No depletion  Thoracic and Lumbar Region No depletion  Buccal Region No depletion  Temple Region No depletion  Clavicle Bone Region Mild depletion  Clavicle and Acromion Bone Region No depletion  Scapular Bone Region No depletion  Dorsal Hand  No depletion  Patellar Region No depletion  Anterior Thigh Region No depletion  Posterior Calf Region Moderate depletion  Edema (RD Assessment) Mild  Hair Reviewed  Eyes Reviewed  Mouth Reviewed  Skin Reviewed  Nails Reviewed      Diet Order:   Diet Order             Diet NPO time specified  Diet effective now                   EDUCATION NEEDS:   Not appropriate for education at  this time  Skin:  Skin Assessment: Reviewed RN Assessment  Last BM:  04/21/23 (type 7)  Height:   Ht Readings from Last 1 Encounters:  04/03/23 5\' 8"  (1.727 m)    Weight:   Wt Readings from Last 1 Encounters:  04/08/23 77.4 kg    Ideal Body Weight:  70 kg  BMI:  Body mass index is 25.95 kg/m.  Estimated Nutritional Needs:   Kcal:  1900-2100  Protein:  105-120 grams  Fluid:  > 1.9 L    Levada Schilling, RD, LDN, CDCES Registered Dietitian III Certified Diabetes Care and Education Specialist If unable to reach this RD, please use "RD Inpatient" group chat on secure chat between hours of 8am-4 pm daily

## 2023-04-22 NOTE — Progress Notes (Signed)
ID Pt is now getting treatment for NMS Intensivist, psychiatrist on board ID work up negative Discussed with Dr.Sreeram ID will sign off- call if needed

## 2023-04-22 NOTE — Progress Notes (Addendum)
SLP Cancellation Note  Patient Details Name: Jared Mayer MRN: [id number] DOB: 18-Dec-1970   Cancelled treatment:       Reason Eval/Treat Not Completed: Fatigue/lethargy limiting ability to participate;Patient's level of consciousness;Patient not medically ready (chart reviewed; consulted NSG then consulted MD via secure chat)  Pt has a Baseline of Lewy body Dementia, erratic behavior/encephalopathy, dependency on being fed, and current, lengthy illness/hospitalization. W/ this presentation and Baseline, he is ALWAYS at increased risk for aspiration. Pt also has has Dysphagia Baseline (documented per MBSS in 2020 c/b IMPULSIVE eating/drinking behaviors and decreased awareness), and he needs 100% Supervision w/ all oral intake. Speech first did his Eval on 04/10/23 at admit, and he appeared functional for a dysphagia level 3 diet w/ thins w/ minced meats; appeared at HIS Baseline. WBC was WNL, Afebrile, on RA, awake/alert. He was getting 100% Supervision w/ po's and Pills CRUSHED in puree then, as rec'd.   NSG has reported a decline in current medical status in the past few days, and not alerting/awaking fully at this time. Pt is NPO. Of Note: a rapid response was called by NSG on 12/11 d/t pt's decreased alerting.  Pt continues to have poor alertness; eyes open w/ head turn to R side/stare, but not responding.  Recommend continued NPO status including meds; frequent oral care when pt is awake for stim of swallowing and oral hygiene.  Speech therapy services can be reconsulted for a repeat BSE if pt can alert/awake appropriately for safe oral intake. Also recommend a Palliative Care consult for overall GOC discussion. NSG consulted and agreed. Secure chat to MD, Team.      Jerilynn Som, MS, CCC-SLP Speech Language Pathologist Rehab Services; [facility] [phone] (ascom) [name] 04/22/2023, 11:04 AM

## 2023-04-22 NOTE — Progress Notes (Signed)
PT Cancellation Note  Patient Details Name: Jared Mayer MRN: [id number] DOB: 01/13/1971   Cancelled Treatment:    Reason Eval/Treat Not Completed: PT screened, no needs identified, will sign off. Pt's mobility needs remain essentially dependent and cognitive impairments preclude much participate. Pt's mobility needs fall within the scope of daily nursing care. Recommend use of bed features 3-5xdaily to achieve upright sitting, natural and/or artificial light level synchronous to daytime hours to facilitate circadian clock. If desired, OOB to chair will require mechanical lift, however given cognitive impairment and postural fatigue, do not see this as the best way to achieve pulmonary hygiene. No additional skilled PT services needed at this time. PT signing off.   2:09 PM, 04/22/23 Rosamaria Lints, PT, DPT Physical Therapist - [facility]  [phone] (ASCOM)    Jared Mayer 04/22/2023, 2:07 PM

## 2023-04-22 NOTE — Plan of Care (Signed)
Patient continues to be A/O x0 and rigid . Only responds to pain and repositioned q2 hours. Started D5/0.45 @ 125 ml/hr at beginning of shift. Continues to utilize male purewick. Patient NPO unable to administer oral medications. Problem: Fluid Volume: Goal: Hemodynamic stability will improve Outcome: Progressing   Problem: Clinical Measurements: Goal: Diagnostic test results will improve Outcome: Progressing Goal: Signs and symptoms of infection will decrease Outcome: Progressing   Problem: Respiratory: Goal: Ability to maintain adequate ventilation will improve Outcome: Progressing   Problem: Education: Goal: Knowledge of General Education information will improve Description: Including pain rating scale, medication(s)/side effects and non-pharmacologic comfort measures Outcome: Progressing   Problem: Health Behavior/Discharge Planning: Goal: Ability to manage health-related needs will improve Outcome: Progressing   Problem: Clinical Measurements: Goal: Ability to maintain clinical measurements within normal limits will improve Outcome: Progressing Goal: Will remain free from infection Outcome: Progressing Goal: Diagnostic test results will improve Outcome: Progressing Goal: Respiratory complications will improve Outcome: Progressing Goal: Cardiovascular complication will be avoided Outcome: Progressing   Problem: Activity: Goal: Risk for activity intolerance will decrease Outcome: Progressing   Problem: Nutrition: Goal: Adequate nutrition will be maintained Outcome: Progressing   Problem: Coping: Goal: Level of anxiety will decrease Outcome: Progressing   Problem: Elimination: Goal: Will not experience complications related to bowel motility Outcome: Progressing Goal: Will not experience complications related to urinary retention Outcome: Progressing   Problem: Pain Management: Goal: General experience of comfort will improve Outcome: Progressing    Problem: Safety: Goal: Ability to remain free from injury will improve Outcome: Progressing   Problem: Skin Integrity: Goal: Risk for impaired skin integrity will decrease Outcome: Progressing

## 2023-04-22 NOTE — Consult Note (Signed)
NAME:  Jared Mayer, MRN:  [id number], DOB:  1970-08-23, LOS: 14 ADMISSION DATE:  04/08/2023, CONSULTATION DATE:  04/22/2023 REFERRING MD:  Marcelino Duster, MD, CHIEF COMPLAINT:  NMS   History of Present Illness:   52 year old male with history of lewy body dementia and psychotic disorder presenting to the hospital with altered mental status. He has developed persistent fevers during his hospitalization with a negative infectious workup and concern for NMS vs Serotonin Syndrome. Patient is being considered for NMS treatment with dantrolene, and given high risk medication critical care consult is requested.  Patient initially presented to the hospital on 11/23 for behavioral medicine evaluation, with cognitive decline and increased rigidity. He was admitted to the internal medicine service on 04/08/2023 given development of fever while in the ED. He was also noted to have rhabdomyolysis. While on the IM service, patient continued to be febrile with an overall negative infectious workup, despite antibiotic administration.  Today, the patient was examined at the bedside. He was non-verbal and non-responsive. He has a grimace. Unable to obtain history from the patient and collateral history obtained from chart review and discussing with the attending of record, Dr. Clide Dales.  Pertinent  Medical History   -Lewy Body Dementia -Psychotic Disorder NOS  Objective   Blood pressure 132/76, pulse (!) 126, temperature (!) 103 F (39.4 C), temperature source Oral, resp. rate (!) 38, weight 77.4 kg, SpO2 95%.        Intake/Output Summary (Last 24 hours) at 04/22/2023 1046 Last data filed at 04/21/2023 1800 Gross per 24 hour  Intake 1678.31 ml  Output --  Net 1678.31 ml   Filed Weights   04/08/23 2358  Weight: 77.4 kg    Examination: Physical Exam Constitutional:      General: He is not in acute distress.    Appearance: He is ill-appearing.  Cardiovascular:     Rate and Rhythm: Normal  rate and regular rhythm.     Pulses: Normal pulses.     Heart sounds: Normal heart sounds.  Pulmonary:     Effort: No respiratory distress.     Breath sounds: No rales.  Neurological:     Mental Status: He is disoriented.     Comments: Patient is stiff, does not follow commands, has tremors. 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 Patient has received first generation anti-psychotic medications in addition to serotonergic agents, and has developed fevers while in the hospital. Exam is notable for rigidity, altered mental status, and lack of clonus.  Differential for symptoms includes catatonia, neuroleptic malignant syndrome, and serotonin syndrome. Presence of rhabdomyolysis and lack of clonus argues for NMS and against serotonin syndrome. 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  For management, would recommend initiation of bromocriptine (2.5 mg every 8 hours), continuing benzodiazepines as needed for agitation, and continuing supportive measures. Patient will benefit from a cooling blanket and continued IV fluids for management of his rhabdomyolysis. Agree with holding all causative agents.  -Trial lorazepam IV for catatonia -initiate bromocriptine 2.5 mg q 8 hours via NG tube -continue IV fluids, favor 100 mL/hour of LR, please attempt to cool fluids before administration -cooling blanket -standing tylenol every 6 hours -continue supportive measures  -PCCM to follow  Best Practice (  right click and "Reselect all SmartList Selections" daily)   Diet/type: NPO DVT prophylaxis LMWH Pressure ulcer(s): N/A GI prophylaxis: N/A Lines: N/A Foley:  N/A Code Status:  full code Last date of  multidisciplinary goals of care discussion [04/22/2023]  Labs   CBC: Recent Labs  Lab 04/18/23 1034 04/20/23 0405 04/21/23 1021 04/22/23 0634  WBC 5.3 5.2 8.5 13.3*  NEUTROABS 4.2 3.2  --   --   HGB 15.6 15.1 16.9 15.5  HCT 45.4 44.9 49.5 46.2  MCV 95.4 96.1 94.1 96.3  PLT 306 238 237 216    Basic Metabolic Panel: Recent Labs  Lab 04/18/23 1034 04/20/23 0405 04/21/23 1021 04/22/23 0634  NA 140 140 143 141  K 3.9 3.8 3.2* 3.4*  CL 105 108 109 111  CO2 23 21* 21* 24  GLUCOSE 134* 86 102* 133*  BUN 32* 27* 30* 25*  CREATININE 0.84 0.70  0.75 0.79 0.61  CALCIUM 8.9 8.3* 8.8* 8.2*   GFR: Estimated Creatinine Clearance: 104.5 mL/min (by C-G formula based on SCr of 0.61 mg/dL). Recent Labs  Lab 04/18/23 1034 04/18/23 1310 04/18/23 1740 04/20/23 0405 04/21/23 1021 04/22/23 0634  PROCALCITON <0.10  --   --  <0.10  --   --   WBC 5.3  --   --  5.2 8.5 13.3*  LATICACIDVEN 1.5 3.4* 2.8* 1.1 1.7  --     Liver Function Tests: Recent Labs  Lab 04/18/23 1034 04/20/23 0405 04/22/23 0634  AST 65* 100* 54*  ALT 45* 51* 39  ALKPHOS 75 59 55  BILITOT 0.6 0.9 0.7  PROT 7.2 6.3* 5.9*  ALBUMIN 3.8 3.3* 2.9*   No results for input(s): "LIPASE", "AMYLASE" in the last 168 hours. Recent Labs  Lab 04/21/23 1021  AMMONIA 34    ABG    Component Value Date/Time   PHART 7.54 (H) 04/21/2023 0938   PCO2ART 31 (L) 04/21/2023 0938   PO2ART 75 (L) 04/21/2023 0938   HCO3 26.5 04/21/2023 0938   O2SAT 97.3 04/21/2023 0938     Coagulation Profile: No results for input(s): "INR", "PROTIME" in the last 168 hours.  Cardiac Enzymes: Recent Labs  Lab 04/18/23 1028 04/20/23 0405 04/21/23 1353 04/22/23 0634  CKTOTAL 691* 1,529* 1,006* 619*    HbA1C: No results found for: "HGBA1C"  CBG: Recent Labs  Lab 04/21/23 1323 04/21/23 1743 04/21/23 2019 04/22/23 0405 04/22/23 0912  GLUCAP 94 94 102* 132* 149*    Review of Systems:   Unable to obtain  Past Medical  History:  He,  has a past medical history of Dementia (HCC), Dyslipidemia, Hypothyroid, Manic behavior (HCC), Mentally disabled, and Schizoaffective disorder, bipolar type (HCC).   Surgical History:  History reviewed. No pertinent surgical history.   Social History:   reports that he has never smoked. He has never used smokeless tobacco. He reports that he does not drink alcohol and does not use drugs.   Family History:  His family history includes Hypothyroidism in his mother; Sarcoidosis in his mother.   Allergies No Known Allergies   Home Medications  Prior to Admission medications   Medication Sig Start Date End Date Taking? Authorizing Provider  acetaminophen (TYLENOL) 325 MG tablet Take 650 mg by mouth every 4 (four) hours as needed.   Yes [provider]  atorvastatin (LIPITOR) 40 MG tablet Take 1 tablet (40 mg total) by mouth daily. 03/17/21  Yes Zigmund Saif., MD  bisacodyl (DULCOLAX) 10 MG suppository Place 10 mg rectally daily  as needed. 02/09/23  Yes [provider]  chlorhexidine (PERIDEX) 0.12 % solution 7.5 mLs by Mouth Rinse route 2 (two) times daily. After brushing teeth. Do not swallow or rinse with water or mouthwash after. 01/30/21  Yes [provider]  clonazePAM (KLONOPIN) 1 MG tablet Take 1 mg by mouth 3 (three) times daily. 03/15/23  Yes [provider]  diclofenac Sodium (VOLTAREN) 1 % GEL Apply 2 g topically 2 (two) times daily as needed. To both knees 03/24/23  Yes [provider]  divalproex (DEPAKOTE ER) 500 MG 24 hr tablet Take 2,000 mg by mouth at bedtime.   Yes [provider]  docusate sodium (COLACE) 100 MG capsule Take 100 mg by mouth 2 (two) times daily. Hold for loose stools.   Yes [provider]  fexofenadine (ALLEGRA) 60 MG tablet Take 60 mg by mouth daily as needed for allergies.   Yes [provider]  glycopyrrolate (ROBINUL) 1 MG tablet Take 1 mg by mouth 2 (two) times  daily. 03/05/21  Yes [provider]  guaifenesin (ROBITUSSIN) 100 MG/5ML syrup Take 200 mg by mouth every 6 (six) hours as needed for cough. Up to 48 hours. Call physician if cough persists more than 48 hours and/or cough is accompanied by fever over 101 degrees orally.   Yes [provider]  INGREZZA 80 MG capsule Take 80 mg by mouth at bedtime. 02/17/21  Yes [provider]  ketoconazole (NIZORAL) 2 % cream Apply 1 Application topically 2 (two) times daily. Apply topically to redness on face twice daily for 4 weeks. 03/16/23  Yes [provider]  lactulose (CHRONULAC) 10 GM/15ML solution Take 20 g by mouth 2 (two) times daily. 03/24/23  Yes [provider]  lamoTRIgine (LAMICTAL) 100 MG tablet Take 100 mg by mouth daily.   Yes [provider]  levothyroxine (SYNTHROID) 125 MCG tablet Take 125 mcg by mouth daily. 03/15/23  Yes [provider]  loxapine (LOXITANE) 25 MG capsule Take 25 mg by mouth 3 (three) times daily. 03/15/23  Yes [provider]  Melatonin 10 MG TABS Take 10 mg by mouth at bedtime.   Yes [provider]  midodrine (PROAMATINE) 2.5 MG tablet Take 2.5 mg by mouth 2 (two) times daily. Hold med if SBP is more than 110. 03/29/23  Yes [provider]  mirabegron ER (MYRBETRIQ) 50 MG TB24 tablet Take 1 tablet (50 mg total) by mouth daily. 10/26/22  Yes Vaillancourt, Lelon Mast, PA-C  neomycin-bacitracin-polymyxin [phone] OINT Apply 1 Application topically daily. Cleanse right knee skin tear with normal saline, apply ointment topically and cover with non-stick dressing once a day until healed.   Yes [provider]  omeprazole (PRILOSEC) 40 MG capsule Take 40 mg by mouth daily. 10/21/21  Yes [provider]  PARoxetine (PAXIL) 40 MG tablet Take 40 mg by mouth daily. 10/21/21  Yes [provider]  polyethylene glycol powder (GLYCOLAX/MIRALAX) 17 GM/SCOOP powder Take 34 g by  mouth daily. Mix with 8 ounces of water 11/19/20  Yes [provider]  simethicone (MYLICON) 80 MG chewable tablet Chew 80 mg by mouth every 6 (six) hours as needed for flatulence.   Yes [provider]  sodium fluoride (PREVIDENT 5000 PLUS) 1.1 % CREA dental cream Place 1 application  onto teeth 4 (four) times daily - after meals and at bedtime. Use to brush teeth thoroughly for at least 2 minutes after meals and at bedtime. Do not rinse afterwards.  Yes [provider]  SYSTANE COMPLETE 0.6 % SOLN Place 1 drop into both eyes 3 (three) times daily as needed for dry eyes. 01/21/21  Yes [provider]  topiramate (TOPAMAX) 25 MG tablet Take 75 mg by mouth 2 (two) times daily.   Yes [provider]     Critical care time: 45 minutes    Raechel Chute, MD [facility] 04/22/2023 11:42 AM

## 2023-04-23 DIAGNOSIS — G3183 Dementia with Lewy bodies: Secondary | ICD-10-CM | POA: Diagnosis not present

## 2023-04-23 DIAGNOSIS — F250 Schizoaffective disorder, bipolar type: Secondary | ICD-10-CM | POA: Diagnosis not present

## 2023-04-23 DIAGNOSIS — E876 Hypokalemia: Secondary | ICD-10-CM | POA: Insufficient documentation

## 2023-04-23 DIAGNOSIS — F419 Anxiety disorder, unspecified: Secondary | ICD-10-CM | POA: Diagnosis not present

## 2023-04-23 DIAGNOSIS — Z515 Encounter for palliative care: Secondary | ICD-10-CM | POA: Diagnosis not present

## 2023-04-23 DIAGNOSIS — M6282 Rhabdomyolysis: Secondary | ICD-10-CM | POA: Diagnosis not present

## 2023-04-23 DIAGNOSIS — R400 Somnolence: Secondary | ICD-10-CM | POA: Diagnosis not present

## 2023-04-23 DIAGNOSIS — R410 Disorientation, unspecified: Secondary | ICD-10-CM | POA: Diagnosis not present

## 2023-04-23 DIAGNOSIS — R296 Repeated falls: Secondary | ICD-10-CM | POA: Diagnosis not present

## 2023-04-23 LAB — GLUCOSE, CAPILLARY
Glucose-Capillary: 101 mg/dL — ABNORMAL HIGH (ref 70–99)
Glucose-Capillary: 109 mg/dL — ABNORMAL HIGH (ref 70–99)
Glucose-Capillary: 114 mg/dL — ABNORMAL HIGH (ref 70–99)
Glucose-Capillary: 116 mg/dL — ABNORMAL HIGH (ref 70–99)
Glucose-Capillary: 126 mg/dL — ABNORMAL HIGH (ref 70–99)
Glucose-Capillary: 128 mg/dL — ABNORMAL HIGH (ref 70–99)
Glucose-Capillary: 98 mg/dL (ref 70–99)

## 2023-04-23 LAB — PHOSPHORUS
Phosphorus: 2.3 mg/dL — ABNORMAL LOW (ref 2.5–4.6)
Phosphorus: 2.7 mg/dL (ref 2.5–4.6)

## 2023-04-23 LAB — BASIC METABOLIC PANEL
Anion gap: 7 (ref 5–15)
BUN: 34 mg/dL — ABNORMAL HIGH (ref 6–20)
CO2: 22 mmol/L (ref 22–32)
Calcium: 7.7 mg/dL — ABNORMAL LOW (ref 8.9–10.3)
Chloride: 110 mmol/L (ref 98–111)
Creatinine, Ser: 0.49 mg/dL — ABNORMAL LOW (ref 0.61–1.24)
GFR, Estimated: >60 mL/min (ref >60)
Glucose, Bld: 142 mg/dL — ABNORMAL HIGH (ref 70–99)
Potassium: 3.4 mmol/L — ABNORMAL LOW (ref 3.5–5.1)
Sodium: 139 mmol/L (ref 135–145)

## 2023-04-23 LAB — MAGNESIUM
Magnesium: 2.5 mg/dL — ABNORMAL HIGH (ref 1.7–2.4)
Magnesium: 2.3 mg/dL (ref 1.7–2.4)

## 2023-04-23 LAB — CULTURE, BLOOD (ROUTINE X 2): Culture: NO GROWTH

## 2023-04-23 LAB — CK: Total CK: 368 U/L (ref 49–397)

## 2023-04-23 MED ORDER — POLYETHYLENE GLYCOL 3350 17 G PO PACK
34.0000 g | PACK | Freq: Every day | ORAL | Status: DC
  Administered 2023-04-27 – 2023-05-02 (×6): 34 g
  Filled 2023-04-23 (×7): qty 2

## 2023-04-23 MED ORDER — DOCUSATE SODIUM 50 MG/5ML PO LIQD
100.0000 mg | Freq: Two times a day (BID) | ORAL | Status: DC
Start: 2023-04-23 — End: 2023-05-04
  Administered 2023-04-24 – 2023-05-04 (×15): 100 mg
  Filled 2023-04-23 (×23): qty 10

## 2023-04-23 MED ORDER — POTASSIUM & SODIUM PHOSPHATES 280-160-250 MG PO PACK
2.0000 | PACK | Freq: Every day | ORAL | Status: DC
  Administered 2023-04-23: 2
  Filled 2023-04-23: qty 2

## 2023-04-23 MED ORDER — BROMOCRIPTINE MESYLATE 2.5 MG PO TABS
5.0000 mg | ORAL_TABLET | Freq: Three times a day (TID) | ORAL | Status: DC
  Administered 2023-04-23 – 2023-05-07 (×42): 5 mg via NASOGASTRIC
  Filled 2023-04-23 (×45): qty 2

## 2023-04-23 MED ORDER — POTASSIUM CHLORIDE 20 MEQ PO PACK
40.0000 meq | PACK | Freq: Once | ORAL | Status: AC
  Administered 2023-04-23: 40 meq
  Filled 2023-04-23: qty 2

## 2023-04-23 NOTE — Progress Notes (Addendum)
Nutrition Follow-up  DOCUMENTATION CODES:   Not applicable  INTERVENTION:   -TF via NGT:   Osmolite 1.5 @ 50 ml/hr     60 ml Prosource TF BID   30 ml free water flush every 4 hours to maintain tube patency   Tube feeding regimen provides 1960 kcal (100% of needs), 115 grams of protein, and 912 ml of H2O. Total free water: 1092 ml daily -Continue MVI with minerals daily via tube -Continue 100 mg thiamine daily x 7 days via tube -Monitor Mg, K, and Phos and replete as needed secondary to high refeeding risk; pharmacy following for electrolyte management   NUTRITION DIAGNOSIS:   Inadequate oral intake related to inability to eat as evidenced by NPO status.  Ongoing  GOAL:   Patient will meet greater than or equal to 90% of their needs  Met with TF  MONITOR:   Diet advancement, TF tolerance  REASON FOR ASSESSMENT:   Consult Enteral/tube feeding initiation and management  ASSESSMENT:   Pt with medical history significant for schizoaffective disorder, bipolar type, dyslipidemia, drug-induced parkinsonism, and hypothyroidism, who presented with acute onset of altered mental status with recent recurrent falls and unsteady gait with worsening disorientation and repetitive speech pattern as well as erratic behavior.  11/30- s/p BSE- advanced diet to dysphagia 3 diet with thin liquids  12/11- s/p BSE- downgraded to NPO secondary to mental status, s/p EEG- WDL, s/p NGT placement under fluoroscopy (tip of tube in gastric body)  Reviewed I/O's: -326 ml x 24 hours and -3.6 L since admission  UOP: 550 ml x 24 hours   Per PCCM notes, plan to trial IV lorazepam for catatonia; presentation consistent with NMS. ID also following and suspects medications may be contributing to mental status changes.   NGT attempted by bedside nurse unsuccessfully, NGT placed under fluoroscopy yesterday (04/22/23).   Pt lying in bed at time of visit. He did not respond to voice or touch. Noted NGT  present and Osmolite 1.5 infusing at goal rate of 50 ml/hr.   No wt loss since admission.   Palliative care consult pending for goals of care discussions.   Medications reviewed and include colace, chronulac, protonix, miralax, vitamin D, and lactated ringers infusion @ 100 ml/hr.   Labs reviewed: K: 3.4 (on supplementation) , Phos: 2.3 (on supplementation), Mg: 2.5, CBGS: 109-183 (inpatient orders for glycemic control are none).    Diet Order:   Diet Order             Diet NPO time specified  Diet effective now                   EDUCATION NEEDS:   Not appropriate for education at this time  Skin:  Skin Assessment: Reviewed RN Assessment  Last BM:  04/22/23  Height:   Ht Readings from Last 1 Encounters:  04/03/23 5\' 8"  (1.727 m)    Weight:   Wt Readings from Last 1 Encounters:  04/23/23 79.9 kg    Ideal Body Weight:  70 kg  BMI:  Body mass index is 26.78 kg/m.  Estimated Nutritional Needs:   Kcal:  1900-2100  Protein:  105-120 grams  Fluid:  > 1.9 L    Levada Schilling, RD, LDN, CDCES Registered Dietitian III Certified Diabetes Care and Education Specialist If unable to reach this RD, please use "RD Inpatient" group chat on secure chat between hours of 8am-4 pm daily

## 2023-04-23 NOTE — Plan of Care (Signed)

## 2023-04-23 NOTE — Progress Notes (Signed)
OT Cancellation Note  Patient Details Name: Jared Mayer MRN: [id number] DOB: [birth year]   Cancelled Treatment:    Reason Eval/Treat Not Completed: Medical issues which prohibited therapy. Per medical record, pt non-responsive today. Hold.  Alvester Morin 04/23/2023, 12:41 PM

## 2023-04-23 NOTE — Progress Notes (Signed)
NAME:  Dutch Piech, MRN:  [id number], DOB:  11-08-1970, LOS: 15 ADMISSION DATE:  04/08/2023, CHIEF COMPLAINT:  NMS   History of Present Illness:   52 year old male with history of lewy body dementia and psychotic disorder presenting to the hospital with altered mental status. He has developed persistent fevers during his hospitalization with a negative infectious workup and concern for NMS vs Serotonin Syndrome. Patient is being considered for NMS treatment with dantrolene, and given high risk medication critical care consult is requested.   Patient initially presented to the hospital on 11/23 for behavioral medicine evaluation, with cognitive decline and increased rigidity. He was admitted to the internal medicine service on 04/08/2023 given development of fever while in the ED. He was also noted to have rhabdomyolysis. While on the IM service, patient continued to be febrile with an overall negative infectious workup, despite antibiotic administration.   Today, the patient was examined at the bedside. He was non-verbal and non-responsive. He has a grimace. Unable to obtain history from the patient and collateral history obtained from chart review and discussing with the attending of record, Dr. Clide Dales.  Pertinent  Medical History   -Lewy Body Dementia -Psychotic Disorder NOS  Significant Hospital Events: Including procedures, antibiotic start and stop dates in addition to other pertinent events   12/12: NG tube placed, initiated on bromocriptine 12/13: remains non-responsive  Interim History / Subjective:  Remains non-responsive, non-verbal  Objective   Blood pressure (!) 137/55, pulse (!) 110, temperature 100.1 F (37.8 C), temperature source Axillary, resp. rate 18, weight 79.9 kg, SpO2 96%.        Intake/Output Summary (Last 24 hours) at 04/23/2023 0856 Last data filed at 04/23/2023 0254 Gross per 24 hour  Intake 223.63 ml  Output 550 ml  Net -326.37 ml   Filed Weights    04/08/23 2358 04/23/23 0500  Weight: 77.4 kg 79.9 kg    Examination: Physical Exam Constitutional:      General: He is not in acute distress.    Appearance: He is ill-appearing.  Cardiovascular:     Rate and Rhythm: Normal rate and regular rhythm.     Pulses: Normal pulses.     Heart sounds: Normal heart sounds.  Pulmonary:     Effort: Pulmonary effort is normal.     Breath sounds: Normal breath sounds.  Abdominal:     Palpations: Abdomen is soft.  Neurological:     Mental Status: He is disoriented.     Motor: Weakness present.     Comments: Stiff, resists movement, no clonus     Assessment & Plan:   #Toxic Metabolic Encephalopathy #Neuroleptic Malignant Syndrome #Lewy Body Dementia   Patient with history of Lewy Body Dementia and accompanying psychotic disorder (unclear if schizophrenia, schizoaffective, or NOS) presenting with subacute decline in mental status. Patient has received first generation anti-psychotic medications in addition to serotonergic agents, and has developed fevers while in the hospital. Exam is notable for rigidity, altered mental status, and lack of clonus. All anti-psychotic medications have been held.   Differential for symptoms includes catatonia, neuroleptic malignant syndrome, and serotonin syndrome. Presence of rhabdomyolysis and lack of clonus argues for NMS and against serotonin syndrome. 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   For management, would recommend continuing bromocriptine (2.5 mg every 8 hours), continuing benzodiazepines as needed for agitation, and continuing supportive measures (fluids, cooling blanket, tylenol). Check daily CK. Agree with holding all causative agents.   -continue bromocriptine 2.5  mg q 8 hours via NG tube -continue IV fluids, favor 100 mL/hour of LR, please attempt to cool fluids before administration -cooling blanket -standing tylenol every 6 hours -continue supportive measures   -PCCM to follow  Raechel Chute, MD [facility] 04/23/2023 9:00 AM   Labs   CBC: Recent Labs  Lab 04/18/23 1034 04/20/23 0405 04/21/23 1021 04/22/23 0634  WBC 5.3 5.2 8.5 13.3*  NEUTROABS 4.2 3.2  --   --   HGB 15.6 15.1 16.9 15.5  HCT 45.4 44.9 49.5 46.2  MCV 95.4 96.1 94.1 96.3  PLT 306 238 237 216    Basic Metabolic Panel: Recent Labs  Lab 04/18/23 1034 04/20/23 0405 04/21/23 1021 04/22/23 0634 04/22/23 1055 04/22/23 1302 04/22/23 1716 04/23/23 0422  NA 140 140 143 141  --   --   --  139  K 3.9 3.8 3.2* 3.4*  --   --   --  3.4*  CL 105 108 109 111  --   --   --  110  CO2 23 21* 21* 24  --   --   --  22  GLUCOSE 134* 86 102* 133*  --   --   --  142*  BUN 32* 27* 30* 25*  --   --   --  34*  CREATININE 0.84 0.70  0.75 0.79 0.61  --   --   --  0.49*  CALCIUM 8.9 8.3* 8.8* 8.2*  --   --   --  7.7*  MG  --   --   --   --  2.1 2.4 2.4 2.5*  PHOS  --   --   --   --   --  2.4* 2.2* 2.3*   GFR: Estimated Creatinine Clearance: 104.5 mL/min (A) (by C-G formula based on SCr of 0.49 mg/dL (L)). Recent Labs  Lab 04/18/23 1034 04/18/23 1310 04/18/23 1740 04/20/23 0405 04/21/23 1021 04/22/23 0634 04/22/23 1055  PROCALCITON <0.10  --   --  <0.10  --   --   --   WBC 5.3  --   --  5.2 8.5 13.3*  --   LATICACIDVEN 1.5   < > 2.8* 1.1 1.7  --  1.3   < > = values in this interval not displayed.    Liver Function Tests: Recent Labs  Lab 04/18/23 1034 04/20/23 0405 04/22/23 0634  AST 65* 100* 54*  ALT 45* 51* 39  ALKPHOS 75 59 55  BILITOT 0.6 0.9 0.7  PROT 7.2 6.3* 5.9*  ALBUMIN 3.8 3.3* 2.9*   No results for input(s): "LIPASE", "AMYLASE" in the last 168 hours. Recent Labs  Lab 04/21/23 1021  AMMONIA 34    ABG    Component Value Date/Time   PHART 7.54 (H) 04/21/2023 0938   PCO2ART 31 (L) 04/21/2023 0938   PO2ART 75 (L) 04/21/2023 0938   HCO3 26.5 04/21/2023 0938   O2SAT 97.3 04/21/2023 0938     Coagulation Profile: No results  for input(s): "INR", "PROTIME" in the last 168 hours.  Cardiac Enzymes: Recent Labs  Lab 04/18/23 1028 04/20/23 0405 04/21/23 1353 04/22/23 0634  CKTOTAL 691* 1,529* 1,006* 619*    HbA1C: No results found for: "HGBA1C"  CBG: Recent Labs  Lab 04/22/23 1703 04/22/23 2036 04/23/23 0110 04/23/23 0413 04/23/23 0816  GLUCAP 183* 134* 109* 128* 116*    Past Medical History:  He,  has a past medical history of Dementia (HCC), Dyslipidemia, Hypothyroid, Manic behavior (HCC),  Mentally disabled, and Schizoaffective disorder, bipolar type (HCC).   Surgical History:  History reviewed. No pertinent surgical history.   Social History:   reports that he has never smoked. He has never used smokeless tobacco. He reports that he does not drink alcohol and does not use drugs.   Family History:  His family history includes Hypothyroidism in his mother; Sarcoidosis in his mother.   Allergies No Known Allergies   Home Medications  Prior to Admission medications   Medication Sig Start Date End Date Taking? Authorizing Provider  acetaminophen (TYLENOL) 325 MG tablet Take 650 mg by mouth every 4 (four) hours as needed.   Yes [provider]  atorvastatin (LIPITOR) 40 MG tablet Take 1 tablet (40 mg total) by mouth daily. 03/17/21  Yes Zigmund Piercen., MD  bisacodyl (DULCOLAX) 10 MG suppository Place 10 mg rectally daily as needed. 02/09/23  Yes [provider]  chlorhexidine (PERIDEX) 0.12 % solution 7.5 mLs by Mouth Rinse route 2 (two) times daily. After brushing teeth. Do not swallow or rinse with water or mouthwash after. 01/30/21  Yes [provider]  clonazePAM (KLONOPIN) 1 MG tablet Take 1 mg by mouth 3 (three) times daily. 03/15/23  Yes [provider]  diclofenac Sodium (VOLTAREN) 1 % GEL Apply 2 g topically 2 (two) times daily as needed. To both knees 03/24/23  Yes [provider]  divalproex (DEPAKOTE ER) 500 MG 24 hr tablet Take 2,000  mg by mouth at bedtime.   Yes [provider]  docusate sodium (COLACE) 100 MG capsule Take 100 mg by mouth 2 (two) times daily. Hold for loose stools.   Yes [provider]  fexofenadine (ALLEGRA) 60 MG tablet Take 60 mg by mouth daily as needed for allergies.   Yes [provider]  glycopyrrolate (ROBINUL) 1 MG tablet Take 1 mg by mouth 2 (two) times daily. 03/05/21  Yes [provider]  guaifenesin (ROBITUSSIN) 100 MG/5ML syrup Take 200 mg by mouth every 6 (six) hours as needed for cough. Up to 48 hours. Call physician if cough persists more than 48 hours and/or cough is accompanied by fever over 101 degrees orally.   Yes [provider]  INGREZZA 80 MG capsule Take 80 mg by mouth at bedtime. 02/17/21  Yes [provider]  ketoconazole (NIZORAL) 2 % cream Apply 1 Application topically 2 (two) times daily. Apply topically to redness on face twice daily for 4 weeks. 03/16/23  Yes [provider]  lactulose (CHRONULAC) 10 GM/15ML solution Take 20 g by mouth 2 (two) times daily. 03/24/23  Yes [provider]  lamoTRIgine (LAMICTAL) 100 MG tablet Take 100 mg by mouth daily.   Yes [provider]  levothyroxine (SYNTHROID) 125 MCG tablet Take 125 mcg by mouth daily. 03/15/23  Yes [provider]  loxapine (LOXITANE) 25 MG capsule Take 25 mg by mouth 3 (three) times daily. 03/15/23  Yes [provider]  Melatonin 10 MG TABS Take 10 mg by mouth at bedtime.   Yes [provider]  midodrine (PROAMATINE) 2.5 MG tablet Take 2.5 mg by mouth 2 (two) times daily. Hold med if SBP is more than 110. 03/29/23  Yes [provider]  mirabegron ER (MYRBETRIQ) 50 MG TB24 tablet Take 1 tablet (50 mg total) by mouth daily. 10/26/22  Yes Vaillancourt, Lelon Mast, PA-C  neomycin-bacitracin-polymyxin [phone] OINT Apply 1 Application topically daily. Cleanse right knee skin tear with normal saline, apply ointment  topically and cover  with non-stick dressing once a day until healed.   Yes [provider]  omeprazole (PRILOSEC) 40 MG capsule Take 40 mg by mouth daily. 10/21/21  Yes [provider]  PARoxetine (PAXIL) 40 MG tablet Take 40 mg by mouth daily. 10/21/21  Yes [provider]  polyethylene glycol powder (GLYCOLAX/MIRALAX) 17 GM/SCOOP powder Take 34 g by mouth daily. Mix with 8 ounces of water 11/19/20  Yes [provider]  simethicone (MYLICON) 80 MG chewable tablet Chew 80 mg by mouth every 6 (six) hours as needed for flatulence.   Yes [provider]  sodium fluoride (PREVIDENT 5000 PLUS) 1.1 % CREA dental cream Place 1 application  onto teeth 4 (four) times daily - after meals and at bedtime. Use to brush teeth thoroughly for at least 2 minutes after meals and at bedtime. Do not rinse afterwards.   Yes [provider]  SYSTANE COMPLETE 0.6 % SOLN Place 1 drop into both eyes 3 (three) times daily as needed for dry eyes. 01/21/21  Yes [provider]  topiramate (TOPAMAX) 25 MG tablet Take 75 mg by mouth 2 (two) times daily.   Yes [provider]      I spent 35 minutes caring for this patient today, including preparing to see the patient, obtaining a medical history , reviewing a separately obtained history, performing a medically appropriate examination and/or evaluation, counseling and educating the patient/family/caregiver, and ordering medications, tests, or procedures

## 2023-04-23 NOTE — Plan of Care (Signed)
  Problem: Clinical Measurements: Goal: Diagnostic test results will improve Outcome: Progressing   Problem: Nutrition: Goal: Adequate nutrition will be maintained Outcome: Progressing   Problem: Elimination: Goal: Will not experience complications related to bowel motility Outcome: Progressing   Problem: Pain Management: Goal: General experience of comfort will improve Outcome: Progressing

## 2023-04-23 NOTE — Consult Note (Signed)
PHARMACY CONSULT NOTE - ELECTROLYTES  Pharmacy Consult for Electrolyte Monitoring and Replacement   Recent Labs: Weight: 79.9 kg (176 lb 2.4 oz) Estimated Creatinine Clearance: 104.5 mL/min (A) (by C-G formula based on SCr of 0.49 mg/dL (L)). Potassium (mmol/L)  Date Value  04/23/2023 3.4 (L)   Magnesium (mg/dL)  Date Value  16/02/9603 2.5 (H)   Calcium (mg/dL)  Date Value  54/01/8118 7.7 (L)   Albumin (g/dL)  Date Value  14/78/2956 2.9 (L)   Phosphorus (mg/dL)  Date Value  21/30/8657 2.3 (L)   Sodium (mmol/L)  Date Value  04/23/2023 139   Corrected Ca: 8.6 mg/dL  Assessment  Jared Mayer is a 52 y.o. male presenting with Neuroleptic Malignant Syndrome. PMH significant for schizoaffective disorder, HLD, hypothyroidism, and intellectual disability. Pharmacy has been consulted to monitor and replace electrolytes.  Diet: NPO with possibly tube feeding starting soon MIVF: LR @ 100 mL/hr  Goal of Therapy: Electrolytes WNL  Plan:  K = 3.4, order placed for Kcl 40 mEq packet per NG x 1 Phos = 2.3 order placed for Phos-NaK 2 packets per tube daily for now Check BMP, Mg, Phos with AM labs  Thank you for allowing pharmacy to be a part of this patient's care.  Angelique Blonder, PharmD Clinical Pharmacist 04/23/2023 12:33 PM

## 2023-04-23 NOTE — Progress Notes (Signed)
Progress Note   Patient: Jared Mayer XLK:[id number] DOB: 30-Jul-1970 DOA: 04/08/2023     15 DOS: the patient was seen and examined on 04/23/2023   Brief hospital course: Jared Mayer is a 52 y.o. male with medical history significant for intellectual disability, schizoaffective disorder, dyslipidemia, drug-induced parkinsonism, hypothyroidism, presented to ED for evaluation of altered mental status, disorientation, repetitive speech pattern, erratic behavior and recurrent falls.  Reportedly, he had been more aggressive at the group home than usual admitted to hospitalist service for further management evaluation of altered mental status.  Assessment and Plan: Acute metabolic encephalopathy Baseline intellectual disability, schizoaffective disorder.  Patient usually able to respond.  He has been more lethargic and sleepy, unable to wake him with even with painful stimuli.  Continue supportive care.  EEG no epileptiform discharges. High suspicion for NMS. Continue supportive care. Continue NG tube for feeds and meds ordered. PCCM follow up appreciated.  Febrile illness, Tachycardia. SIRS High Suspicion for Neurolept malignant syndrome Tmax 100.9, remains tachycardic. No infectious source found.  Repeat blood cultures, UA, chest x-ray unremarkable Continue to hold all antipsychotic medications.   Continue to monitor electrolytes and replete accordingly. Continue gentle IV hydration. Cooling blanket for high grade fever. PCCM follow up appreciated, continue Bromocriptine through NG tube 2.5 mg q8hr.  Hypokalemia- Kcl via NG tube ordered. Continue to monitor daily electrolytes.  Hypophosphatemia: Pharmacy to replete electrolytes. Oral phos daily replacement.  Rhabdomyolysis Elevated LFT In the setting of use of antipsychotic medications, NMS.  Hold off antipsychotics, hepatotoxic medications. LFT, CK improving. Continue to IV hydration.  Schizoaffective disorder: Psychiatry  evaluation appreciated. Hold current antipsychotics. Valproic acid level lower side, ammonia 34.    Nursing supportive care. Fall, aspiration precautions. DVT prophylaxis   Code Status: Full Code  Subjective: Patient is seen and examined today morning. Tmax 100.9. He is grimacing. Occasionally opens eyes, does not follow commands. He is tolerating NG feeds.  Physical Exam: Vitals:   04/23/23 0332 04/23/23 0500 04/23/23 0815 04/23/23 1238  BP: 108/72  (!) 137/55 130/64  Pulse: (!) 107  (!) 110 (!) 118  Resp:   18 (!) 24  Temp: 100.1 F (37.8 C)  99.5 F (37.5 C) 100 F (37.8 C)  TempSrc: Axillary  Axillary Oral  SpO2: 93%  96% 96%  Weight:  79.9 kg     General -Middle aged Caucasian male, sleepy, lethargic HEENT - PERRLA, EOMI, atraumatic head, non tender sinuses. Lung - Clear, bibasal rales, rhonchi, no wheezes. Heart - S1, S2 heard, no murmurs, rubs, no pedal edema. Abdomen - Soft, non tender, bowel sounds good Neuro - Sleepy, lethargic, unable to do full neuroexam.. Skin - Warm and dry.  Data Reviewed:      Latest Ref Rng & Units 04/22/2023    6:34 AM 04/21/2023   10:21 AM 04/20/2023    4:05 AM  CBC  WBC 4.0 - 10.5 K/uL 13.3  8.5  5.2   Hemoglobin 13.0 - 17.0 g/dL 36.6  44.0  34.7   Hematocrit 39.0 - 52.0 % 46.2  49.5  44.9   Platelets 150 - 400 K/uL 216  237  238       Latest Ref Rng & Units 04/23/2023    4:22 AM 04/22/2023    6:34 AM 04/21/2023   10:21 AM  BMP  Glucose 70 - 99 mg/dL 425  956  387   BUN 6 - 20 mg/dL 34  25  30   Creatinine 0.61 - 1.24 mg/dL 5.64  0.61  0.79   Sodium 135 - 145 mmol/L 139  141  143   Potassium 3.5 - 5.1 mmol/L 3.4  3.4  3.2   Chloride 98 - 111 mmol/L 110  111  109   CO2 22 - 32 mmol/L 22  24  21    Calcium 8.9 - 10.3 mg/dL 7.7  8.2  8.8    DG Naso G Tube Plc W/Fl W/Rad Result Date: 04/22/2023 CLINICAL DATA:  Provided history: Dysphagia. Request received for nasogastric placement under fluoroscopy. EXAM: NASO G TUBE  PLACEMENT WITH FL AND WITH RAD CONTRAST:  None. FLUOROSCOPY: Fluoroscopy Time:  36 seconds Radiation Exposure Index (if provided by the fluoroscopic device): 7.50 Number of Acquired Spot Images: 1 COMPARISON:  CT chest/abdomen/pelvis 04/18/2023. FINDINGS: The right nostril was lubricated with lidocaine gel. Subsequently under intermittent fluoroscopic guidance, a nasogastric tube was advanced via the right nostril to the level of the gastric body. There was immediate return of brown-colored gastric contents. The enteric tube was secured at the nose. Fluoroscopic images and a radiograph were acquired and sent to PACS. No immediate post-procedure complication was apparent. The procedure was performed by Alwyn Ren, NP, supervised by Dr. Jackey Loge. IMPRESSION: Successful fluoroscopically-guided nasogastric tube placement (with tube tip at the level of the gastric body). Electronically Signed   By: Jackey Loge D.O.   On: 04/22/2023 15:53   EEG adult Result Date: 04/21/2023 Charlsie Quest, MD     04/21/2023  5:44 PM Patient Name: Jared Mayer MRN: [id number] Epilepsy Attending: Charlsie Quest Referring Physician/Provider: Marcelino Duster, MD Date: 04/21/2023 Duration: 26.13 mins Patient history: 52 yo M with ams getting eeg to evaluate for seizure Level of alertness: Awake AEDs during EEG study: LTG, TPM Technical aspects: This EEG study was done with scalp electrodes positioned according to the 10-20 International system of electrode placement. Electrical activity was reviewed with band pass filter of 1-70Hz , sensitivity of 7 uV/mm, display speed of 28mm/sec with a 60Hz  notched filter applied as appropriate. EEG data were recorded continuously and digitally stored.  Video monitoring was available and reviewed as appropriate. Description: The posterior dominant rhythm consists of 8 Hz activity of moderate voltage (25-35 uV) seen predominantly in posterior head regions, symmetric and reactive to eye  opening and eye closing. Hyperventilation and photic stimulation were not performed.   IMPRESSION: This study is within normal limits. No seizures or epileptiform discharges were seen throughout the recording. A normal interictal EEG does not exclude the diagnosis of epilepsy. Priyanka [name]   Family Communication: Discussed with mother yesterday, who agreed with palliative care for goals of care discussion.  Disposition: Status is: Inpatient Remains inpatient appropriate because: Fever, AMS, NMS treatment.  Planned Discharge Destination: Skilled nursing facility  MDM level 3- Patient is sick, with fever, altered, high suspicion for NMS.  Patient needs close hemodynamic, neurologic, telemetry monitoring.  Patient is at high risk for sudden clinical deterioration.  Author: Marcelino Duster, MD 04/23/2023 1:39 PM Secure chat 7am to 7pm For on call review [email or web address].

## 2023-04-23 NOTE — Consult Note (Signed)
Consultation Note Date: 04/23/2023   Patient Name: Jared Mayer  DOB: 12-06-1970  MRN: [id number]  Age / Sex: 52 y.o., male  PCP: [facility] Referring Physician: Marcelino Duster, MD  Reason for Consultation: Establishing goals of care   HPI/Brief Hospital Course: 52 y.o. male  with past medical history of intellectual disability, schizoaffective disorder, drug-induced parkinsonism, HLD, hypothyroidism and Lewy body dementia admitted from group home on 04/08/2023 with AMS, erratic behavior and recurrent falls.  Concern for neuroleptic malignant syndrome-has been started on bromocriptine EEG (-)  Palliative medicine was consulted for assisting with goals of care conversations.  Subjective:  Extensive chart review has been completed prior to meeting patient including labs, vital signs, imaging, progress notes, orders, and available advanced directive documents from current and previous encounters.  Visited with Jared Mayer at his bedside, he is resting in bed with eyes closed, does not acknowledge my presence in room. No family at bedside during time of visit.  Called and spoke with legal guardian, his mother, [name] who works as a Engineer, civil (consulting) in ED.  Introduced myself as a Publishing rights manager as a member of the palliative care team. Explained palliative medicine is specialized medical care for people living with serious illness. It focuses on providing relief from the symptoms and stress of a serious illness. The goal is to improve quality of life for both the patient and the family.   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 She notes that the week prior to Thanksgiving is when Jared Mayer behavior became erratic and he was suffering from recurrent falls.  [name] has been communicating with primary team and aware of high suspicion for NMS and treatment was started 12/12.  [name] shares that she does not wish to allow Mr. Melrose to suffer. She shares she is struggling with seeing him in the state he is in currently. She is hopeful that there will be improvement and recovery with initiation of treatment.  Plan set to met in person at bedside to further address goals of care.  I discussed importance of continued conversations with family/support persons and all members of their medical team regarding overall plan of care and treatment options ensuring decisions are in alignment with patients goals of care.  All questions/concerns addressed. Emotional support provided to patient/family/support persons. PMT will continue to follow and support patient as needed.  Objective: Primary Diagnoses: Present on Admission:  Altered mental status  Schizoaffective disorder, bipolar type (HCC)  Vital Signs: BP 124/72 (BP Location: Left Arm)   Pulse (!) 117   Temp 100 F (37.8 C) (Oral)   Resp 18   Wt 79.9 kg   SpO2 97%   BMI 26.78 kg/m  Pain Scale: Faces   Pain Score: 0-No pain  IO: Intake/output summary:  Intake/Output Summary (Last 24 hours) at 04/23/2023 1645 Last data filed at 04/23/2023 0254 Gross per 24 hour  Intake 223.63 ml  Output 550 ml  Net -326.37 ml    LBM: Last BM Date : 04/22/23 Baseline Weight: Weight: 77.4 kg Most recent weight: Weight: 79.9 kg      Assessment and Plan  SUMMARY OF RECOMMENDATIONS   Time for outcomes/ongoing GOC PMT to continue to follow for ongoing needs and support  Palliative Prophylaxis:   Bowel Regimen, Delirium Protocol and Frequent Pain Assessment   Thank you for this consult and allowing Palliative Medicine to participate in the  care of Jared Mayer. Palliative medicine will continue to follow and assist as needed.   Time Total: 55 minutes  Time spent includes: Detailed review of medical records (labs, imaging, vital signs), medically appropriate exam (mental status, respiratory, cardiac, skin), discussed with treatment team, counseling and educating patient, family and staff, documenting clinical information, medication management and coordination of care.   Signed by: Leeanne Deed, DNP, AGNP-C Palliative Medicine    Please contact Palliative Medicine Team phone at [phone] for questions and concerns.  For individual provider: See Loretha Stapler

## 2023-04-24 DIAGNOSIS — R296 Repeated falls: Secondary | ICD-10-CM | POA: Diagnosis not present

## 2023-04-24 DIAGNOSIS — Z515 Encounter for palliative care: Secondary | ICD-10-CM | POA: Diagnosis not present

## 2023-04-24 DIAGNOSIS — M6282 Rhabdomyolysis: Secondary | ICD-10-CM | POA: Diagnosis not present

## 2023-04-24 DIAGNOSIS — R410 Disorientation, unspecified: Secondary | ICD-10-CM | POA: Diagnosis not present

## 2023-04-24 DIAGNOSIS — F250 Schizoaffective disorder, bipolar type: Secondary | ICD-10-CM | POA: Diagnosis not present

## 2023-04-24 DIAGNOSIS — R400 Somnolence: Secondary | ICD-10-CM | POA: Diagnosis not present

## 2023-04-24 DIAGNOSIS — G3183 Dementia with Lewy bodies: Secondary | ICD-10-CM | POA: Diagnosis not present

## 2023-04-24 DIAGNOSIS — F419 Anxiety disorder, unspecified: Secondary | ICD-10-CM | POA: Diagnosis not present

## 2023-04-24 LAB — PHOSPHORUS: Phosphorus: 2.7 mg/dL (ref 2.5–4.6)

## 2023-04-24 LAB — BASIC METABOLIC PANEL
Anion gap: 7 (ref 5–15)
BUN: 30 mg/dL — ABNORMAL HIGH (ref 6–20)
CO2: 26 mmol/L (ref 22–32)
Calcium: 7.8 mg/dL — ABNORMAL LOW (ref 8.9–10.3)
Chloride: 111 mmol/L (ref 98–111)
Creatinine, Ser: 0.69 mg/dL (ref 0.61–1.24)
GFR, Estimated: >60 mL/min (ref >60)
Glucose, Bld: 140 mg/dL — ABNORMAL HIGH (ref 70–99)
Potassium: 3.9 mmol/L (ref 3.5–5.1)
Sodium: 144 mmol/L (ref 135–145)

## 2023-04-24 LAB — GLUCOSE, CAPILLARY
Glucose-Capillary: 107 mg/dL — ABNORMAL HIGH (ref 70–99)
Glucose-Capillary: 120 mg/dL — ABNORMAL HIGH (ref 70–99)
Glucose-Capillary: 134 mg/dL — ABNORMAL HIGH (ref 70–99)
Glucose-Capillary: 142 mg/dL — ABNORMAL HIGH (ref 70–99)

## 2023-04-24 LAB — CK: Total CK: 334 U/L (ref 49–397)

## 2023-04-24 MED ORDER — MAGNESIUM HYDROXIDE 400 MG/5ML PO SUSP: 30.0000 mL | Freq: Every day | ORAL | Status: DC | PRN

## 2023-04-24 MED ORDER — SODIUM CHLORIDE 0.9 % IV SOLN: INTRAVENOUS | Status: DC

## 2023-04-24 MED ORDER — LACTULOSE 10 GM/15ML PO SOLN
20.0000 g | Freq: Two times a day (BID) | ORAL | Status: DC
  Administered 2023-04-24 – 2023-04-27 (×4): 20 g
  Filled 2023-04-24 (×4): qty 30

## 2023-04-24 MED ORDER — PANTOPRAZOLE SODIUM 40 MG IV SOLR
40.0000 mg | INTRAVENOUS | Status: DC
Start: 2023-04-24 — End: 2023-05-11
  Administered 2023-04-24 – 2023-05-11 (×18): 40 mg via INTRAVENOUS
  Filled 2023-04-24 (×17): qty 10

## 2023-04-24 MED ORDER — GLYCOPYRROLATE 1 MG PO TABS
1.0000 mg | ORAL_TABLET | Freq: Two times a day (BID) | ORAL | Status: DC
Start: 2023-04-24 — End: 2023-05-11
  Administered 2023-04-24 – 2023-05-11 (×33): 1 mg
  Filled 2023-04-24 (×34): qty 1

## 2023-04-24 MED ORDER — CLONAZEPAM 1 MG PO TABS
1.0000 mg | ORAL_TABLET | Freq: Two times a day (BID) | ORAL | Status: DC | PRN
  Administered 2023-04-24 – 2023-04-27 (×5): 1 mg
  Filled 2023-04-24 (×5): qty 1

## 2023-04-24 NOTE — Consult Note (Signed)
PHARMACY CONSULT NOTE - ELECTROLYTES  Pharmacy Consult for Electrolyte Monitoring and Replacement   Recent Labs: Weight: 79.9 kg (176 lb 2.4 oz) Estimated Creatinine Clearance: 104.5 mL/min (by C-G formula based on SCr of 0.69 mg/dL). Potassium (mmol/L)  Date Value  04/24/2023 3.9   Magnesium (mg/dL)  Date Value  96/08/5407 2.3   Calcium (mg/dL)  Date Value  81/19/1478 7.8 (L)   Albumin (g/dL)  Date Value  29/56/2130 2.9 (L)   Phosphorus (mg/dL)  Date Value  86/57/8469 2.7   Sodium (mmol/L)  Date Value  04/24/2023 144   Corrected Ca: 8.6 mg/dL  Assessment  Jared Mayer is a 52 y.o. male presenting with Neuroleptic Malignant Syndrome. PMH significant for schizoaffective disorder, HLD, hypothyroidism, and intellectual disability. Pharmacy has been consulted to monitor and replace electrolytes.  Diet: NPO with possibly tube feeding starting soon MIVF: LR @ 100 mL/hr - stopped o nfree water 30 ml q4H.   Goal of Therapy: Electrolytes WNL  Plan:  Will discontinue potassium/sodium phosphate.  No replacement needed.  F/u with AM labs.   Thank you for allowing pharmacy to be a part of this patient's care.  Ronnald Ramp, PharmD Clinical Pharmacist 04/24/2023 7:52 AM

## 2023-04-24 NOTE — Progress Notes (Signed)
Daily Progress Note   Patient Name: Jared Mayer       Date: 04/24/2023 DOB: [birth year]  Age: 52 y.o. MRN#: [id number] Attending Physician: Marcelino Duster, MD Primary Care Physician: Almetta Lovely, [facility] Admit Date: 04/08/2023  Reason for Consultation/Follow-up: Establishing goals of care  HPI/Brief Hospital Review:  51 y.o. male  with past medical history of intellectual disability, schizoaffective disorder, drug-induced parkinsonism, HLD and hypothyroidism admitted from group home on 04/08/2023 with AMS, erratic behavior and recurrent falls.   Concern for neuroleptic malignant syndrome-has been started on bromocriptine EEG (-)   Palliative medicine was consulted for assisting with goals of care conversations.  Subjective: Extensive chart review has been completed prior to meeting patient including labs, vital signs, imaging, progress notes, orders, and available advanced directive documents from current and previous encounters.    Visited with Jared Mayer at his bedside. He remains non-responsive, has facial grimace somewhat alleviated by repositioning. Temperature checked while in room by nursing staff, elevated to 101.9-recommend cooling blanket.  Returned to bedside to meet with mother-Jared Mayer. During visit with Jared Mayer, Jared Mayer able to open his eyes, no interaction or response, drifts back off to sleep without redirection. Continues to have moments of grimacing. We discussed treatment plan, increased dosing of bromocriptine. CK levels improved yesterday, he remains febrile. Jared Mayer requests for all temperatures to be monitored rectally for accuracy-passed along to nursing staff.  Attempted to elicit goals of care. Jared Mayer shares she is struggling with seeing her son in this  condition. She shares if there is not a chance of improvement of recovery, this would not be an acceptable quality of life for him. We also addressed code status, at this time Jared Mayer would like Full Code to remain but open to ongoing conversations based on clinical improvement, requests time for outcomes.  Answered and addressed all questions and concerns. PMT to continue to follow for ongoing needs and support.  Thank you for allowing the Palliative Medicine Team to assist in the care of this patient.  Total time:  35 minutes  Time spent includes: Detailed review of medical records (labs, imaging, vital signs), medically appropriate exam (mental status, respiratory, cardiac, skin), discussed with treatment team, counseling and educating patient, family and staff, documenting clinical information, medication management and coordination of care.  Leeanne Deed, DNP, AGNP-C Palliative Medicine  Please contact Palliative Medicine Team phone at [phone] for questions and concerns.

## 2023-04-24 NOTE — Progress Notes (Signed)
NAME:  Jared Mayer, MRN:  [id number], DOB:  04/18/1971, LOS: 16 ADMISSION DATE:  04/08/2023, CHIEF COMPLAINT:  NMS   History of Present Illness:   52 year old male with history of lewy body dementia and psychotic disorder presenting to the hospital with altered mental status. He has developed persistent fevers during his hospitalization with a negative infectious workup and concern for NMS vs Serotonin Syndrome. Patient is being considered for NMS treatment with dantrolene, and given high risk medication critical care consult is requested.   Patient initially presented to the hospital on 11/23 for behavioral medicine evaluation, with cognitive decline and increased rigidity. He was admitted to the internal medicine service on 04/08/2023 given development of fever while in the ED. He was also noted to have rhabdomyolysis. While on the IM service, patient continued to be febrile with an overall negative infectious workup, despite antibiotic administration.   Today, the patient was examined at the bedside. He was non-verbal and non-responsive. He has a grimace. Unable to obtain history from the patient and collateral history obtained from chart review and discussing with the attending of record, Dr. Clide Dales.  Pertinent  Medical History   -Lewy Body Dementia -Psychotic Disorder NOS  Significant Hospital Events: Including procedures, antibiotic start and stop dates in addition to other pertinent events   12/12: NG tube placed, initiated on bromocriptine 12/13: remains non-responsive, bromocriptine increased to 5 mg q 8 hours  Interim History / Subjective:  Remains non-responsive, non-verbal  Objective   Blood pressure 107/73, pulse (!) 115, temperature 99.6 F (37.6 C), temperature source Oral, resp. rate 18, weight 79.9 kg, SpO2 97%.        Intake/Output Summary (Last 24 hours) at 04/24/2023 0941 Last data filed at 04/23/2023 2049 Gross per 24 hour  Intake --  Output 700 ml  Net  -700 ml   Filed Weights   04/08/23 2358 04/23/23 0500  Weight: 77.4 kg 79.9 kg    Examination: Physical Exam Constitutional:      General: He is not in acute distress.    Appearance: He is ill-appearing.  Cardiovascular:     Rate and Rhythm: Normal rate and regular rhythm.     Pulses: Normal pulses.     Heart sounds: Normal heart sounds.  Pulmonary:     Effort: Pulmonary effort is normal.     Breath sounds: Normal breath sounds.  Abdominal:     Palpations: Abdomen is soft.  Neurological:     Mental Status: He is disoriented.     Motor: Weakness present.     Comments: Stiff, resists movement, no clonus     Assessment & Plan:   #Toxic Metabolic Encephalopathy #Neuroleptic Malignant Syndrome #Lewy Body Dementia   Patient with history of Lewy Body Dementia and accompanying psychotic disorder (unclear if schizophrenia, schizoaffective, or NOS) presenting with subacute decline in mental status. Patient has received first generation anti-psychotic medications in addition to serotonergic agents, and has developed fevers while in the hospital. Exam is notable for rigidity, altered mental status, and lack of clonus. All anti-psychotic medications have been held.   Differential for symptoms includes catatonia, neuroleptic malignant syndrome, and serotonin syndrome. Presence of rhabdomyolysis and lack of clonus argues for NMS and against serotonin syndrome. 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 CK's were significantly elevated, but have trended down.   For management, would recommend continuing bromocriptine (increased to 5 mg every 8 hours), continuing benzodiazepines as needed for agitation, and continuing supportive measures (fluids, cooling  blanket, tylenol). Check daily CK. Agree with holding all causative agents.   -continue bromocriptine 5 mg q 8 hours via NG tube -continue IV fluids -cooling blanket -standing tylenol every 6 hours -continue  supportive measures  -PCCM to sign off, please re-consult with any questions.  Raechel Chute, MD [facility] 04/24/2023 11:08 AM   Labs   CBC: Recent Labs  Lab 04/18/23 1034 04/20/23 0405 04/21/23 1021 04/22/23 0634  WBC 5.3 5.2 8.5 13.3*  NEUTROABS 4.2 3.2  --   --   HGB 15.6 15.1 16.9 15.5  HCT 45.4 44.9 49.5 46.2  MCV 95.4 96.1 94.1 96.3  PLT 306 238 237 216    Basic Metabolic Panel: Recent Labs  Lab 04/20/23 0405 04/21/23 1021 04/22/23 0634 04/22/23 1055 04/22/23 1302 04/22/23 1716 04/23/23 0422 04/23/23 1657 04/24/23 0648  NA 140 143 141  --   --   --  139  --  144  K 3.8 3.2* 3.4*  --   --   --  3.4*  --  3.9  CL 108 109 111  --   --   --  110  --  111  CO2 21* 21* 24  --   --   --  22  --  26  GLUCOSE 86 102* 133*  --   --   --  142*  --  140*  BUN 27* 30* 25*  --   --   --  34*  --  30*  CREATININE 0.70  0.75 0.79 0.61  --   --   --  0.49*  --  0.69  CALCIUM 8.3* 8.8* 8.2*  --   --   --  7.7*  --  7.8*  MG  --   --   --  2.1 2.4 2.4 2.5* 2.3  --   PHOS  --   --   --   --  2.4* 2.2* 2.3* 2.7 2.7   GFR: Estimated Creatinine Clearance: 104.5 mL/min (by C-G formula based on SCr of 0.69 mg/dL). Recent Labs  Lab 04/18/23 1034 04/18/23 1310 04/18/23 1740 04/20/23 0405 04/21/23 1021 04/22/23 0634 04/22/23 1055  PROCALCITON <0.10  --   --  <0.10  --   --   --   WBC 5.3  --   --  5.2 8.5 13.3*  --   LATICACIDVEN 1.5   < > 2.8* 1.1 1.7  --  1.3   < > = values in this interval not displayed.    Liver Function Tests: Recent Labs  Lab 04/18/23 1034 04/20/23 0405 04/22/23 0634  AST 65* 100* 54*  ALT 45* 51* 39  ALKPHOS 75 59 55  BILITOT 0.6 0.9 0.7  PROT 7.2 6.3* 5.9*  ALBUMIN 3.8 3.3* 2.9*   No results for input(s): "LIPASE", "AMYLASE" in the last 168 hours. Recent Labs  Lab 04/21/23 1021  AMMONIA 34    ABG    Component Value Date/Time   PHART 7.54 (H) 04/21/2023 0938   PCO2ART 31 (L) 04/21/2023 0938   PO2ART 75  (L) 04/21/2023 0938   HCO3 26.5 04/21/2023 0938   O2SAT 97.3 04/21/2023 0938     Coagulation Profile: No results for input(s): "INR", "PROTIME" in the last 168 hours.  Cardiac Enzymes: Recent Labs  Lab 04/18/23 1028 04/20/23 0405 04/21/23 1353 04/22/23 0634 04/23/23 0422  CKTOTAL 691* 1,529* 1,006* 619* 368    HbA1C: No results found for: "HGBA1C"  CBG: Recent Labs  Lab 04/23/23 1217  04/23/23 1655 04/23/23 2029 04/23/23 2352 04/24/23 0324  GLUCAP 114* 98 101* 126* 107*    Past Medical History:  He,  has a past medical history of Dementia (HCC), Dyslipidemia, Hypothyroid, Manic behavior (HCC), Mentally disabled, and Schizoaffective disorder, bipolar type (HCC).   Surgical History:  History reviewed. No pertinent surgical history.   Social History:   reports that he has never smoked. He has never used smokeless tobacco. He reports that he does not drink alcohol and does not use drugs.   Family History:  His family history includes Hypothyroidism in his mother; Sarcoidosis in his mother.   Allergies No Known Allergies   Home Medications  Prior to Admission medications   Medication Sig Start Date End Date Taking? Authorizing Provider  acetaminophen (TYLENOL) 325 MG tablet Take 650 mg by mouth every 4 (four) hours as needed.   Yes [provider]  atorvastatin (LIPITOR) 40 MG tablet Take 1 tablet (40 mg total) by mouth daily. 03/17/21  Yes Zigmund Secundino., MD  bisacodyl (DULCOLAX) 10 MG suppository Place 10 mg rectally daily as needed. 02/09/23  Yes [provider]  chlorhexidine (PERIDEX) 0.12 % solution 7.5 mLs by Mouth Rinse route 2 (two) times daily. After brushing teeth. Do not swallow or rinse with water or mouthwash after. 01/30/21  Yes [provider]  clonazePAM (KLONOPIN) 1 MG tablet Take 1 mg by mouth 3 (three) times daily. 03/15/23  Yes [provider]  diclofenac Sodium (VOLTAREN) 1 % GEL Apply 2 g topically 2  (two) times daily as needed. To both knees 03/24/23  Yes [provider]  divalproex (DEPAKOTE ER) 500 MG 24 hr tablet Take 2,000 mg by mouth at bedtime.   Yes [provider]  docusate sodium (COLACE) 100 MG capsule Take 100 mg by mouth 2 (two) times daily. Hold for loose stools.   Yes [provider]  fexofenadine (ALLEGRA) 60 MG tablet Take 60 mg by mouth daily as needed for allergies.   Yes [provider]  glycopyrrolate (ROBINUL) 1 MG tablet Take 1 mg by mouth 2 (two) times daily. 03/05/21  Yes [provider]  guaifenesin (ROBITUSSIN) 100 MG/5ML syrup Take 200 mg by mouth every 6 (six) hours as needed for cough. Up to 48 hours. Call physician if cough persists more than 48 hours and/or cough is accompanied by fever over 101 degrees orally.   Yes [provider]  INGREZZA 80 MG capsule Take 80 mg by mouth at bedtime. 02/17/21  Yes [provider]  ketoconazole (NIZORAL) 2 % cream Apply 1 Application topically 2 (two) times daily. Apply topically to redness on face twice daily for 4 weeks. 03/16/23  Yes [provider]  lactulose (CHRONULAC) 10 GM/15ML solution Take 20 g by mouth 2 (two) times daily. 03/24/23  Yes [provider]  lamoTRIgine (LAMICTAL) 100 MG tablet Take 100 mg by mouth daily.   Yes [provider]  levothyroxine (SYNTHROID) 125 MCG tablet Take 125 mcg by mouth daily. 03/15/23  Yes [provider]  loxapine (LOXITANE) 25 MG capsule Take 25 mg by mouth 3 (three) times daily. 03/15/23  Yes [provider]  Melatonin 10 MG TABS Take 10 mg by mouth at bedtime.   Yes [provider]  midodrine (PROAMATINE) 2.5 MG tablet Take 2.5 mg by mouth 2 (two) times daily. Hold med if SBP is more than 110. 03/29/23  Yes [provider]  mirabegron ER (MYRBETRIQ) 50 MG TB24 tablet Take  1 tablet (50 mg total) by mouth daily. 10/26/22  Yes Vaillancourt, Lelon Mast, PA-C   neomycin-bacitracin-polymyxin [phone] OINT Apply 1 Application topically daily. Cleanse right knee skin tear with normal saline, apply ointment topically and cover with non-stick dressing once a day until healed.   Yes [provider]  omeprazole (PRILOSEC) 40 MG capsule Take 40 mg by mouth daily. 10/21/21  Yes [provider]  PARoxetine (PAXIL) 40 MG tablet Take 40 mg by mouth daily. 10/21/21  Yes [provider]  polyethylene glycol powder (GLYCOLAX/MIRALAX) 17 GM/SCOOP powder Take 34 g by mouth daily. Mix with 8 ounces of water 11/19/20  Yes [provider]  simethicone (MYLICON) 80 MG chewable tablet Chew 80 mg by mouth every 6 (six) hours as needed for flatulence.   Yes [provider]  sodium fluoride (PREVIDENT 5000 PLUS) 1.1 % CREA dental cream Place 1 application  onto teeth 4 (four) times daily - after meals and at bedtime. Use to brush teeth thoroughly for at least 2 minutes after meals and at bedtime. Do not rinse afterwards.   Yes [provider]  SYSTANE COMPLETE 0.6 % SOLN Place 1 drop into both eyes 3 (three) times daily as needed for dry eyes. 01/21/21  Yes [provider]  topiramate (TOPAMAX) 25 MG tablet Take 75 mg by mouth 2 (two) times daily.   Yes [provider]      I spent 40 minutes caring for this patient today, including preparing to see the patient, obtaining a medical history , reviewing a separately obtained history, performing a medically appropriate examination and/or evaluation, ordering medications, tests, or procedures, documenting clinical information in the electronic health record, and independently interpreting results (not separately reported/billed) and communicating results to the patient/family/caregiver

## 2023-04-24 NOTE — Progress Notes (Signed)
Temp elevated. 101.9. Patient is already on scheduled tylenol. Order received from MD for cooling blanket

## 2023-04-24 NOTE — Progress Notes (Signed)
Asked to look at patient due to evaluate breathing pattern. Patient on room air. O2 sats 95-100 BBS clear. Patient appears to grunt at times then holds his breath at times. I could not clarify if this may be related to his condition. Advised to follow up with provider

## 2023-04-24 NOTE — Progress Notes (Signed)
Progress Note   Patient: Jared Mayer ZOX:[id number] DOB: 03/29/1971 DOA: 04/08/2023     16 DOS: the patient was seen and examined on 04/24/2023   Brief hospital course: Dreshon Schey is a 52 y.o. male with medical history significant for intellectual disability, schizoaffective disorder, dyslipidemia, drug-induced parkinsonism, hypothyroidism, presented to ED for evaluation of altered mental status, disorientation, repetitive speech pattern, erratic behavior and recurrent falls.  Reportedly, he had been more aggressive at the group home than usual admitted to hospitalist service for further management evaluation of altered mental status.  Assessment and Plan: Acute toxic metabolic encephalopathy Febrile illness, Tachycardia. SIRS High Suspicion for Neurolept malignant syndrome Tmax 101.9, remains tachycardic. Mental status did not improve. No infectious source found.  He is on multiple antipsychotics which are held now. Continue to monitor electrolytes and replete accordingly. Continue gentle IV hydration (Cold IV fluids).  Scheduled Tylenol, cooling blanket for high grade fever. PCCM follow up appreciated, increased Bromocriptine to 5 mg q8hr. Supportive care. Palliative team to discuss goals of care with his mother.  Hypokalemia- improved. Continue to monitor daily electrolytes.  Hypophosphatemia: Phos repleted. Oral phos daily replacement.  Rhabdomyolysis Elevated LFT In the setting of use of antipsychotic medications, NMS.  Hold off antipsychotics, hepatotoxic medications. LFT, CK improving. Continue to IV hydration.  Schizoaffective disorder: Psychiatry follow up appreciated. Hold current antipsychotics.     Nursing supportive care. Fall, aspiration precautions. DVT prophylaxis - Lovenox   Code Status: Full Code Prognosis guarded.  Subjective: Patient is seen and examined today morning. Tmax 101.9. He is grimacing, does not open eyes, has facial tremors.  Extremities rigid. He is tolerating NG feeds.  Physical Exam: Vitals:   04/23/23 2356 04/24/23 0327 04/24/23 0924 04/24/23 1205  BP: 115/66 128/71 107/73 (!) 135/91  Pulse: (!) 120 (!) 117 (!) 115 (!) 124  Resp: 18 18 18 18   Temp: 99.7 F (37.6 C) 100 F (37.8 C) 99.6 F (37.6 C) (!) 101.9 F (38.8 C)  TempSrc: Oral Oral Oral Oral  SpO2: 97% 97% 97% 98%  Weight:       General -Middle aged Caucasian male, sleepy, lethargic HEENT - PERRLA, EOMI, atraumatic head, non tender sinuses. Lung - Clear, bibasal rales, rhonchi, no wheezes. Heart - S1, S2 heard, no murmurs, rubs, no pedal edema. Abdomen - Soft, non tender, bowel sounds good Neuro - Sleepy, lethargic, unable to do full neuroexam.. Skin - Warm and dry.  Data Reviewed:      Latest Ref Rng & Units 04/22/2023    6:34 AM 04/21/2023   10:21 AM 04/20/2023    4:05 AM  CBC  WBC 4.0 - 10.5 K/uL 13.3  8.5  5.2   Hemoglobin 13.0 - 17.0 g/dL 81.1  91.4  78.2   Hematocrit 39.0 - 52.0 % 46.2  49.5  44.9   Platelets 150 - 400 K/uL 216  237  238       Latest Ref Rng & Units 04/24/2023    6:48 AM 04/23/2023    4:22 AM 04/22/2023    6:34 AM  BMP  Glucose 70 - 99 mg/dL 956  213  086   BUN 6 - 20 mg/dL 30  34  25   Creatinine 0.61 - 1.24 mg/dL 5.78  4.69  6.29   Sodium 135 - 145 mmol/L 144  139  141   Potassium 3.5 - 5.1 mmol/L 3.9  3.4  3.4   Chloride 98 - 111 mmol/L 111  110  111   CO2  22 - 32 mmol/L 26  22  24    Calcium 8.9 - 10.3 mg/dL 7.8  7.7  8.2    DG Naso G Tube Plc W/Fl W/Rad Result Date: 04/22/2023 CLINICAL DATA:  Provided history: Dysphagia. Request received for nasogastric placement under fluoroscopy. EXAM: NASO G TUBE PLACEMENT WITH FL AND WITH RAD CONTRAST:  None. FLUOROSCOPY: Fluoroscopy Time:  36 seconds Radiation Exposure Index (if provided by the fluoroscopic device): 7.50 Number of Acquired Spot Images: 1 COMPARISON:  CT chest/abdomen/pelvis 04/18/2023. FINDINGS: The right nostril was lubricated with  lidocaine gel. Subsequently under intermittent fluoroscopic guidance, a nasogastric tube was advanced via the right nostril to the level of the gastric body. There was immediate return of brown-colored gastric contents. The enteric tube was secured at the nose. Fluoroscopic images and a radiograph were acquired and sent to PACS. No immediate post-procedure complication was apparent. The procedure was performed by Alwyn Ren, NP, supervised by Dr. Jackey Loge. IMPRESSION: Successful fluoroscopically-guided nasogastric tube placement (with tube tip at the level of the gastric body). Electronically Signed   By: Jackey Loge D.O.   On: 04/22/2023 15:53   Family Communication: Palliative to discuss with mother regarding goals of care.  Disposition: Status is: Inpatient Remains inpatient appropriate because: Fever, AMS, NMS treatment.  Planned Discharge Destination: Skilled nursing facility  MDM level 3- Patient is sick, with fever, altered, high suspicion for NMS.  Patient needs close hemodynamic, neurologic, telemetry monitoring.  Patient is at high risk for sudden clinical deterioration.  Author: Marcelino Duster, MD 04/24/2023 12:28 PM Secure chat 7am to 7pm For on call review [email or web address].

## 2023-04-25 DIAGNOSIS — M6282 Rhabdomyolysis: Secondary | ICD-10-CM | POA: Diagnosis not present

## 2023-04-25 DIAGNOSIS — F250 Schizoaffective disorder, bipolar type: Secondary | ICD-10-CM | POA: Diagnosis not present

## 2023-04-25 DIAGNOSIS — Z515 Encounter for palliative care: Secondary | ICD-10-CM | POA: Diagnosis not present

## 2023-04-25 DIAGNOSIS — F419 Anxiety disorder, unspecified: Secondary | ICD-10-CM | POA: Diagnosis not present

## 2023-04-25 DIAGNOSIS — R410 Disorientation, unspecified: Secondary | ICD-10-CM | POA: Diagnosis not present

## 2023-04-25 DIAGNOSIS — R296 Repeated falls: Secondary | ICD-10-CM | POA: Diagnosis not present

## 2023-04-25 DIAGNOSIS — R400 Somnolence: Secondary | ICD-10-CM | POA: Diagnosis not present

## 2023-04-25 LAB — BASIC METABOLIC PANEL
Anion gap: 6 (ref 5–15)
BUN: 33 mg/dL — ABNORMAL HIGH (ref 6–20)
CO2: 28 mmol/L (ref 22–32)
Calcium: 7.7 mg/dL — ABNORMAL LOW (ref 8.9–10.3)
Chloride: 112 mmol/L — ABNORMAL HIGH (ref 98–111)
Creatinine, Ser: 0.35 mg/dL — ABNORMAL LOW (ref 0.61–1.24)
GFR, Estimated: >60 mL/min (ref >60)
Glucose, Bld: 114 mg/dL — ABNORMAL HIGH (ref 70–99)
Potassium: 4.1 mmol/L (ref 3.5–5.1)
Sodium: 146 mmol/L — ABNORMAL HIGH (ref 135–145)

## 2023-04-25 LAB — GLUCOSE, CAPILLARY
Glucose-Capillary: 101 mg/dL — ABNORMAL HIGH (ref 70–99)
Glucose-Capillary: 107 mg/dL — ABNORMAL HIGH (ref 70–99)
Glucose-Capillary: 114 mg/dL — ABNORMAL HIGH (ref 70–99)
Glucose-Capillary: 120 mg/dL — ABNORMAL HIGH (ref 70–99)
Glucose-Capillary: 144 mg/dL — ABNORMAL HIGH (ref 70–99)

## 2023-04-25 LAB — HEPATIC FUNCTION PANEL
ALT: 49 U/L — ABNORMAL HIGH (ref 0–44)
AST: 55 U/L — ABNORMAL HIGH (ref 15–41)
Albumin: 2.1 g/dL — ABNORMAL LOW (ref 3.5–5.0)
Alkaline Phosphatase: 54 U/L (ref 38–126)
Bilirubin, Direct: 0.2 mg/dL (ref 0.0–0.2)
Indirect Bilirubin: 0.3 mg/dL (ref 0.3–0.9)
Total Bilirubin: 0.5 mg/dL (ref <1.2)
Total Protein: 4.9 g/dL — ABNORMAL LOW (ref 6.5–8.1)

## 2023-04-25 LAB — CK: Total CK: 312 U/L (ref 49–397)

## 2023-04-25 LAB — MAGNESIUM: Magnesium: 2.5 mg/dL — ABNORMAL HIGH (ref 1.7–2.4)

## 2023-04-25 MED ORDER — LACTATED RINGERS IV SOLN: INTRAVENOUS | Status: AC

## 2023-04-25 NOTE — Progress Notes (Signed)
Progress Note   Patient: Jared Mayer OAC:[id number] DOB: 11/10/1970 DOA: 04/08/2023     17 DOS: the patient was seen and examined on 04/25/2023   Brief hospital course: Jared Mayer is a 52 y.o. male with medical history significant for intellectual disability, schizoaffective disorder, dyslipidemia, drug-induced parkinsonism, hypothyroidism, presented to ED for evaluation of altered mental status, disorientation, repetitive speech pattern, erratic behavior and recurrent falls.  Reportedly, he had been more aggressive at the group home than usual admitted to hospitalist service for further management evaluation of altered mental status, fever.  During hospital stay, his infectious workup so far negative.  ID recommended to stop antibiotics.  Psychiatry evaluated the patient advised to stop clonidine psychotics.  He is at high risk for NMS.  Discussed with PCCM, started on bromocriptine for NMS treatment.    Assessment and Plan: Acute toxic metabolic encephalopathy Febrile illness, Tachycardia. 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 Mental status not improved. No infectious source found.  PCCM follow up appreciated, continue Bromocriptine 5 mg q8hr. Continue Tylenol, cold IV fluids, cooling blanket for hypothermia, continue supportive care. Continue to hold all antipsychotic medications. Continue to monitor electrolytes and replete accordingly. Palliative team to discuss goals of care with his mother.  Hypokalemia- improved. Continue to monitor daily electrolytes.  Hypophosphatemia: Phos repleted. Oral phos daily replacement.  Rhabdomyolysis Elevated LFT In the setting of use of antipsychotic medications, NMS.  Hold off antipsychotics, hepatotoxic medications. CK trended down.  Trend LFT.  Schizoaffective disorder: Psychiatry follow up appreciated. Hold current antipsychotics. Supportive care.    Continue NG care, tube feeds and free  water. Nursing supportive care. Fall, aspiration precautions. DVT prophylaxis - Lovenox   Code Status: Full Code Prognosis guarded.  Subjective: Patient is seen and examined today morning.  Persistently spiking fever. He is, grimaces, does not open eyes, has facial tremors. He is tolerating NG feeds.  Physical Exam: Vitals:   04/25/23 0003 04/25/23 0420 04/25/23 0425 04/25/23 0839  BP: 106/60 99/62  115/66  Pulse:  97  97  Resp: 18 19  19   Temp: 99.5 F (37.5 C) 99.2 F (37.3 C)  97.6 F (36.4 C)  TempSrc:  Rectal  Axillary  SpO2:  96%  100%  Weight:   79.6 kg    General -Middle aged Caucasian male, sleepy, lethargic, grunts HEENT - PERRLA, EOMI, atraumatic head. Lung - Clear, bibasal rales, diffuse rhonchi, no wheezes. Heart - S1, S2 heard, no murmurs, rubs, trace pedal edema. Abdomen - Soft, non tender, bowel sounds good Neuro - Sleepy, lethargic, unable to do full neuroexam.. Skin - Warm and dry.  Data Reviewed:      Latest Ref Rng & Units 04/22/2023    6:34 AM 04/21/2023   10:21 AM 04/20/2023    4:05 AM  CBC  WBC 4.0 - 10.5 K/uL 13.3  8.5  5.2   Hemoglobin 13.0 - 17.0 g/dL 01.0  93.2  35.5   Hematocrit 39.0 - 52.0 % 46.2  49.5  44.9   Platelets 150 - 400 K/uL 216  237  238       Latest Ref Rng & Units 04/25/2023    4:38 AM 04/24/2023    6:48 AM 04/23/2023    4:22 AM  BMP  Glucose 70 - 99 mg/dL 732  202  542   BUN 6 - 20 mg/dL 33  30  34   Creatinine 0.61 - 1.24 mg/dL 7.06  2.37  6.28   Sodium 135 - 145 mmol/L  146  144  139   Potassium 3.5 - 5.1 mmol/L 4.1  3.9  3.4   Chloride 98 - 111 mmol/L 112  111  110   CO2 22 - 32 mmol/L 28  26  22    Calcium 8.9 - 10.3 mg/dL 7.7  7.8  7.7    No results found.  Family Communication: Plan discussed with mother.  Palliative to discuss with mother regarding goals of care.  Disposition: Status is: Inpatient Remains inpatient appropriate because: Fever, AMS, NMS treatment.  Planned Discharge Destination: Skilled  nursing facility  MDM level 3- Patient is sick, with fever, altered, being treated for NMS.  Patient needs close hemodynamic, neurologic, telemetry monitoring.  Patient is at high risk for sudden clinical deterioration.  Author: Marcelino Duster, MD 04/25/2023 12:31 PM Secure chat 7am to 7pm For on call review [email or web address].

## 2023-04-25 NOTE — Plan of Care (Signed)
  Problem: Pain Management: Goal: General experience of comfort will improve Outcome: Progressing

## 2023-04-25 NOTE — TOC Progression Note (Signed)
Transition of Care [facility]) - Progression Note    Patient Details  Name: Ejay Farland MRN: [id number] Date of Birth: 20-May-1970  [facility]) CM/SW Contact  Liliana Cline, LCSW Phone Number: 04/25/2023, 9:44 AM  Clinical Narrative:    Gavin Pound with [facility] states she has to check with her Administrator tomorrow on if they can accept the patient.   Expected Discharge Plan: Assisted Living Barriers to Discharge: Continued Medical Work up  Expected Discharge Plan and Services       Living arrangements for the past 2 months: Assisted Living Facility                                       Social Determinants of Health (SDOH) Interventions SDOH Screenings   Food Insecurity: No Food Insecurity (04/12/2023)  Housing: Patient Unable To Answer (04/12/2023)  Transportation Needs: Patient Unable To Answer (04/12/2023)  Utilities: Patient Unable To Answer (04/12/2023)  Financial Resource Strain: Low Risk  (03/29/2018)  Physical Activity: Unknown (03/29/2018)  Social Connections: Unknown (03/29/2018)  Stress: No Stress Concern Present (03/29/2018)  Tobacco Use: Low Risk  (04/08/2023)    Readmission Risk Interventions     No data to display

## 2023-04-25 NOTE — Consult Note (Signed)
PHARMACY CONSULT NOTE - ELECTROLYTES  Pharmacy Consult for Electrolyte Monitoring and Replacement   Recent Labs: Weight: 79.6 kg (175 lb 7.8 oz) Estimated Creatinine Clearance: 104.5 mL/min (A) (by C-G formula based on SCr of 0.35 mg/dL (L)). Potassium (mmol/L)  Date Value  04/25/2023 4.1   Magnesium (mg/dL)  Date Value  82/95/6213 2.5 (H)   Calcium (mg/dL)  Date Value  08/65/7846 7.7 (L)   Albumin (g/dL)  Date Value  96/29/5284 2.9 (L)   Phosphorus (mg/dL)  Date Value  13/24/4010 2.7   Sodium (mmol/L)  Date Value  04/25/2023 146 (H)   Corrected Ca: 8.6 mg/dL  Assessment  Jared Mayer is a 52 y.o. male presenting with Neuroleptic Malignant Syndrome. PMH significant for schizoaffective disorder, HLD, hypothyroidism, and intellectual disability. Pharmacy has been consulted to monitor and replace electrolytes.  Diet: NPO with possibly tube feeding starting soon MIVF: LR @ 100 mL/hr - stopped,  on free water 30 ml q4H.   Goal of Therapy: Electrolytes WNL  Plan:  No replacement needed.  F/u with AM labs.   Thank you for allowing pharmacy to be a part of this patient's care.  Ronnald Ramp, PharmD Clinical Pharmacist 04/25/2023 7:52 AM

## 2023-04-25 NOTE — Progress Notes (Signed)
                                                                                                                                                                                                           Daily Progress Note   Patient Name: Jared Mayer       Date: 04/25/2023 DOB: [birth year]  Age: 52 y.o. MRN#: [id number] Attending Physician: Marcelino Duster, MD Primary Care Physician: Almetta Lovely, [facility] Admit Date: 04/08/2023  Reason for Consultation/Follow-up: Establishing goals of care  HPI/Brief Hospital Review: 52 y.o. male  with past medical history of intellectual disability, schizoaffective disorder, drug-induced parkinsonism, HLD and hypothyroidism admitted from group home on 04/08/2023 with AMS, erratic behavior and recurrent falls.   Concern for neuroleptic malignant syndrome-has been started on bromocriptine EEG (-)   Palliative medicine was consulted for assisting with goals of care conversations.    Subjective: Extensive chart review has been completed prior to meeting patient including labs, vital signs, imaging, progress notes, orders, and available advanced directive documents from current and previous encounters.    Visited with Jared Mayer at his bedside. He remains non responsive, less facial grimacing noted today. Temperatures improving as well as CK levels. No family at bedside during time of visit.  Returned to bedside to visit with mother-Jared Mayer. Answered and addressed her questions and concerns. She shares she will be working 7a-7p tomorrow, encouraged her to reach out to PMT with any questions or concerns. Will have PMT provider check in with Jared Mayer tomorrow.  Thank you for allowing the Palliative Medicine Team to assist in the care of this patient.  Total time:  25 minutes  Time spent includes: Detailed review of medical records (labs, imaging, vital signs), medically appropriate exam (mental status, respiratory, cardiac, skin), discussed with  treatment team, counseling and educating patient, family and staff, documenting clinical information, medication management and coordination of care.  Leeanne Deed, DNP, AGNP-C Palliative Medicine   Please contact Palliative Medicine Team phone at [phone] for questions and concerns.

## 2023-04-26 DIAGNOSIS — Z515 Encounter for palliative care: Secondary | ICD-10-CM | POA: Diagnosis not present

## 2023-04-26 DIAGNOSIS — M6282 Rhabdomyolysis: Secondary | ICD-10-CM | POA: Diagnosis not present

## 2023-04-26 DIAGNOSIS — F250 Schizoaffective disorder, bipolar type: Secondary | ICD-10-CM | POA: Diagnosis not present

## 2023-04-26 DIAGNOSIS — R4182 Altered mental status, unspecified: Secondary | ICD-10-CM | POA: Diagnosis not present

## 2023-04-26 DIAGNOSIS — R410 Disorientation, unspecified: Secondary | ICD-10-CM | POA: Diagnosis not present

## 2023-04-26 LAB — RENAL FUNCTION PANEL
Albumin: 2.4 g/dL — ABNORMAL LOW (ref 3.5–5.0)
Anion gap: 9 (ref 5–15)
BUN: 32 mg/dL — ABNORMAL HIGH (ref 6–20)
CO2: 25 mmol/L (ref 22–32)
Calcium: 8.4 mg/dL — ABNORMAL LOW (ref 8.9–10.3)
Chloride: 108 mmol/L (ref 98–111)
Creatinine, Ser: 0.55 mg/dL — ABNORMAL LOW (ref 0.61–1.24)
GFR, Estimated: >60 mL/min (ref >60)
Glucose, Bld: 125 mg/dL — ABNORMAL HIGH (ref 70–99)
Phosphorus: 3.1 mg/dL (ref 2.5–4.6)
Potassium: 4.5 mmol/L (ref 3.5–5.1)
Sodium: 142 mmol/L (ref 135–145)

## 2023-04-26 LAB — CBC
HCT: 39.1 % (ref 39.0–52.0)
Hemoglobin: 12.8 g/dL — ABNORMAL LOW (ref 13.0–17.0)
MCH: 32.5 pg (ref 26.0–34.0)
MCHC: 32.7 g/dL (ref 30.0–36.0)
MCV: 99.2 fL (ref 80.0–100.0)
Platelets: 280 10*3/uL (ref 150–400)
RBC: 3.94 MIL/uL — ABNORMAL LOW (ref 4.22–5.81)
RDW: 14.6 % (ref 11.5–15.5)
WBC: 9.1 10*3/uL (ref 4.0–10.5)
nRBC: 0 % (ref 0.0–0.2)

## 2023-04-26 LAB — GLUCOSE, CAPILLARY
Glucose-Capillary: 124 mg/dL — ABNORMAL HIGH (ref 70–99)
Glucose-Capillary: 85 mg/dL (ref 70–99)
Glucose-Capillary: 86 mg/dL (ref 70–99)
Glucose-Capillary: 89 mg/dL (ref 70–99)
Glucose-Capillary: 90 mg/dL (ref 70–99)
Glucose-Capillary: 99 mg/dL (ref 70–99)

## 2023-04-26 LAB — CULTURE, BLOOD (ROUTINE X 2)
Culture: NO GROWTH
Culture: NO GROWTH
Special Requests: ADEQUATE
Special Requests: ADEQUATE

## 2023-04-26 NOTE — Progress Notes (Signed)
Called from RN to come and access pt for excessive cough w secretions. Pt has a weak unproductive cough with no airway secretions heard on ascultation. Breath sounds very diminished bilaterally. Sats 97 with no other issues found. RT and RN will follow with care as needed. RN at bedside.

## 2023-04-26 NOTE — Progress Notes (Signed)
OT Cancellation Note  Patient Details Name: Jared Mayer MRN: [id number] DOB: August 08, 1970   Cancelled Treatment:    Reason Eval/Treat Not Completed: Patient's level of consciousness. Chart reviewed. Pt level of consciousness prohibiting participation in OT x3 dates. Per conversation with RN, pt total care and not appropriate for acute OT. Will sign off. Please re-consult if new needs arise.   Kathie Dike, M.S. OTR/L  04/26/23, 10:54 AM  ascom [phone]

## 2023-04-26 NOTE — Plan of Care (Signed)
  Problem: Clinical Measurements: Goal: Ability to maintain clinical measurements within normal limits will improve Outcome: Progressing   Problem: Nutrition: Goal: Adequate nutrition will be maintained Outcome: Progressing   Problem: Safety: Goal: Ability to remain free from injury will improve Outcome: Progressing   

## 2023-04-26 NOTE — Progress Notes (Signed)
Progress Note   Patient: Jared Mayer DGU:[id number] DOB: 01/22/1971 DOA: 04/08/2023     18 DOS: the patient was seen and examined on 04/26/2023   Brief hospital course: Jared Mayer is a 52 y.o. male with medical history significant for intellectual disability, schizoaffective disorder, dyslipidemia, drug-induced parkinsonism, hypothyroidism, presented to ED for evaluation of altered mental status, disorientation, repetitive speech pattern, erratic behavior and recurrent falls.  Reportedly, he had been more aggressive at the group home than usual admitted to hospitalist service for further management evaluation of altered mental status, fever.  During hospital stay, his infectious workup so far negative.  ID recommended to stop antibiotics.  Psychiatry evaluated the patient advised to stop clonidine psychotics.  He is at high risk for NMS.  Discussed with PCCM, started on bromocriptine for NMS treatment.    Assessment and Plan: Acute toxic metabolic encephalopathy Febrile illness, Tachycardia. 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 Mental status not improved. No infectious source found.  PCCM follow up appreciated --continue Bromocriptine 5 mg q8hr. --cont scheduled tylenol, cold IV fluids, cooling blanket for hypothermia --Continue to hold all antipsychotic medications.  Hypokalemia- Hypophos --monitor and supplement PRN  Tube feed dependent --cont tube feed  Rhabdomyolysis Elevated LFT In the setting of use of antipsychotic medications, NMS.  Hold off antipsychotics, hepatotoxic medications. CK trended down.   Schizoaffective disorder: Psychiatry follow up appreciated. --hold antipsychotics  Hypothyroidism --cont Synthroid     Continue NG care, tube feeds and free water. Nursing supportive care. Fall, aspiration precautions. DVT prophylaxis - Lovenox   Code Status: Full Code Prognosis guarded.  Subjective:  Pt did not  respond.  Physical Exam: Vitals:   04/26/23 0300 04/26/23 0746 04/26/23 1658 04/26/23 1949  BP: 121/69 122/74 118/66 124/88  Pulse: 82 (!) 108 (!) 102 87  Resp: 16 20 20 18   Temp: 97.7 F (36.5 C) 99.5 F (37.5 C) 98.8 F (37.1 C) 98.7 F (37.1 C)  TempSrc: Axillary Axillary Axillary Oral  SpO2: 99% 100% 93% 97%  Weight:       Constitutional: eyes closed, grimacing.   CV: No cyanosis.   RESP: normal respiratory effort SKIN: warm, dry   Data Reviewed:      Latest Ref Rng & Units 04/26/2023    7:56 AM 04/22/2023    6:34 AM 04/21/2023   10:21 AM  CBC  WBC 4.0 - 10.5 K/uL 9.1  13.3  8.5   Hemoglobin 13.0 - 17.0 g/dL 95.6  38.7  56.4   Hematocrit 39.0 - 52.0 % 39.1  46.2  49.5   Platelets 150 - 400 K/uL 280  216  237       Latest Ref Rng & Units 04/26/2023    7:56 AM 04/25/2023    4:38 AM 04/24/2023    6:48 AM  BMP  Glucose 70 - 99 mg/dL 332  951  884   BUN 6 - 20 mg/dL 32  33  30   Creatinine 0.61 - 1.24 mg/dL 1.66  0.63  0.16   Sodium 135 - 145 mmol/L 142  146  144   Potassium 3.5 - 5.1 mmol/L 4.5  4.1  3.9   Chloride 98 - 111 mmol/L 108  112  111   CO2 22 - 32 mmol/L 25  28  26    Calcium 8.9 - 10.3 mg/dL 8.4  7.7  7.8    No results found.  Family Communication:   Disposition: Status is: Inpatient Remains inpatient appropriate because: Fever, AMS, NMS  treatment.  Planned Discharge Destination: Skilled nursing facility  MDM level 3  Author: [name], MD 04/26/2023 8:31 PM Secure chat 7am to 7pm For on call review [email or web address].

## 2023-04-26 NOTE — Consult Note (Signed)
PHARMACY CONSULT NOTE - ELECTROLYTES  Pharmacy Consult for Electrolyte Monitoring and Replacement   Recent Labs: Weight: 79.6 kg (175 lb 7.8 oz) Estimated Creatinine Clearance: 104.5 mL/min (A) (by C-G formula based on SCr of 0.55 mg/dL (L)). Potassium (mmol/L)  Date Value  04/26/2023 4.5   Magnesium (mg/dL)  Date Value  40/98/1191 2.5 (H)   Calcium (mg/dL)  Date Value  47/82/9562 8.4 (L)   Albumin (g/dL)  Date Value  13/12/6576 2.4 (L)   Phosphorus (mg/dL)  Date Value  46/96/2952 3.1   Sodium (mmol/L)  Date Value  04/26/2023 142   Corrected Ca: 8.6 mg/dL  Assessment  Jared Mayer is a 52 y.o. male presenting with Neuroleptic Malignant Syndrome. PMH significant for schizoaffective disorder, HLD, hypothyroidism, and intellectual disability. Pharmacy has been consulted to monitor and replace electrolytes.  Diet: continuous tube feeds MIVF: LR @ 50 mL/hr  on free water 30 ml q4H.   Goal of Therapy: Electrolytes WNL  Plan:  No replacement needed at this time.  F/u with AM labs.   Thank you for allowing pharmacy to be a part of this patient's care.  Angelique Blonder, PharmD Clinical Pharmacist 04/26/2023 11:42 AM

## 2023-04-26 NOTE — TOC Progression Note (Signed)
Transition of Care [facility]) - Progression Note    Patient Details  Name: Jared Mayer MRN: [id number] Date of Birth: [birth year]  [facility] CM/SW Contact  Margarito Liner, LCSW Phone Number: 04/26/2023, 11:47 AM  Clinical Narrative:   [facility] needs to talk with patient's mother before deciding on making bed offer or not. They have left her a message. CSW left mom a voicemail asking her to call them when she is able.  Expected Discharge Plan: Assisted Living Barriers to Discharge: Continued Medical Work up  Expected Discharge Plan and Services       Living arrangements for the past 2 months: Assisted Living Facility                                       Social Determinants of Health (SDOH) Interventions SDOH Screenings   Food Insecurity: No Food Insecurity (04/12/2023)  Housing: Patient Unable To Answer (04/12/2023)  Transportation Needs: Patient Unable To Answer (04/12/2023)  Utilities: Patient Unable To Answer (04/12/2023)  Financial Resource Strain: Low Risk  (03/29/2018)  Physical Activity: Unknown (03/29/2018)  Social Connections: Unknown (03/29/2018)  Stress: No Stress Concern Present (03/29/2018)  Tobacco Use: Low Risk  (04/08/2023)    Readmission Risk Interventions     No data to display

## 2023-04-26 NOTE — Progress Notes (Signed)
                                                     Palliative Care Progress Note, Assessment & Plan   Patient Name: Jared Mayer       Date: 04/26/2023 DOB: 1971-04-06  Age: 52 y.o. MRN#: [id number] Attending Physician: [name], MD Primary Care Physician: [facility] Ad[facility] Date: 04/08/2023  Subjective: Patient is lying in bed in no apparent distress.  He does not awaken to my presence.  He is unable to make his wishes known at this time.  Respirations are even and unlabored.  No family or friends present during my visit.  HPI: 52 y.o. male  with past medical history of intellectual disability, schizoaffective disorder, drug-induced parkinsonism, HLD and hypothyroidism admitted from group home on 04/08/2023 with AMS, erratic behavior and recurrent falls.   Concern for neuroleptic malignant syndrome-has been started on bromocriptine EEG (-)   Palliative medicine was consulted for assisting with goals of care conversations.  Summary of counseling/coordination of care: Extensive chart review completed prior to meeting patient including labs, vital signs, imaging, progress notes, orders, and available advanced directive documents from current and previous encounters.   After reviewing the patient's chart and assessing the patient at bedside, I spoke with patient's legal guardian/mother Jared Mayer over the phone.  Brief medical update given.  I attempted to discuss boundaries and goals of care with [facility].  She shares she is hopeful that patient can discharge to [facility] to the LTC portion.  She is not sure when this will be.  I shared her patient will be need to be medically stabilized before transfer to [facility].  As a nurse herself, she understands the process of insurance approval and medical readiness.  She also inquired about if  patient could be removed from warming blanket.  I shared her request with attending.  Attempted to elicit values and goals important to the patient.  Jared Mayer shares she had patient open his eyes for about 60 seconds over the weekend.  She remains hopeful that small signs of his neurological function returning towards baseline continue to happen.  Discussed it being a slow process and taking it 1 day at a time.  No adjustment to plan of care at this time.  PMT will continue to follow and support patient and family throughout this hospitalization.  Physical Exam Vitals reviewed.  Constitutional:      General: He is not in acute distress. HENT:     Head: Normocephalic.     Nose: Nose normal.  Cardiovascular:     Rate and Rhythm: Normal rate.  Pulmonary:     Effort: Pulmonary effort is normal.  Abdominal:     Palpations: Abdomen is soft.  Musculoskeletal:     Comments: Does not move extremities to command  Skin:    General: Skin is warm and dry.             Total Time 25 minutes   Time spent includes: Detailed review of medical records (labs, imaging, vital signs), medically appropriate exam (mental status, respiratory, cardiac, skin), discussed with treatment team, counseling and educating patient, family and staff, documenting clinical information, medication management and coordination of care.  Jared Mayer. Jared Quin, DNP, FNP-BC Palliative Medicine Team

## 2023-04-27 ENCOUNTER — Inpatient Hospital Stay: Payer: Medicare HMO

## 2023-04-27 DIAGNOSIS — F250 Schizoaffective disorder, bipolar type: Secondary | ICD-10-CM | POA: Diagnosis not present

## 2023-04-27 DIAGNOSIS — R410 Disorientation, unspecified: Secondary | ICD-10-CM | POA: Diagnosis not present

## 2023-04-27 DIAGNOSIS — R296 Repeated falls: Secondary | ICD-10-CM | POA: Diagnosis not present

## 2023-04-27 DIAGNOSIS — M6282 Rhabdomyolysis: Secondary | ICD-10-CM | POA: Diagnosis not present

## 2023-04-27 LAB — COMPREHENSIVE METABOLIC PANEL
ALT: 109 U/L — ABNORMAL HIGH (ref 0–44)
AST: 103 U/L — ABNORMAL HIGH (ref 15–41)
Albumin: 2.3 g/dL — ABNORMAL LOW (ref 3.5–5.0)
Alkaline Phosphatase: 78 U/L (ref 38–126)
Anion gap: 10 (ref 5–15)
BUN: 26 mg/dL — ABNORMAL HIGH (ref 6–20)
CO2: 24 mmol/L (ref 22–32)
Calcium: 8 mg/dL — ABNORMAL LOW (ref 8.9–10.3)
Chloride: 104 mmol/L (ref 98–111)
Creatinine, Ser: 0.36 mg/dL — ABNORMAL LOW (ref 0.61–1.24)
GFR, Estimated: >60 mL/min (ref >60)
Glucose, Bld: 121 mg/dL — ABNORMAL HIGH (ref 70–99)
Potassium: 4 mmol/L (ref 3.5–5.1)
Sodium: 138 mmol/L (ref 135–145)
Total Bilirubin: 0.3 mg/dL (ref <1.2)
Total Protein: 5.2 g/dL — ABNORMAL LOW (ref 6.5–8.1)

## 2023-04-27 LAB — BASIC METABOLIC PANEL
Anion gap: 8 (ref 5–15)
BUN: 26 mg/dL — ABNORMAL HIGH (ref 6–20)
CO2: 25 mmol/L (ref 22–32)
Calcium: 8.2 mg/dL — ABNORMAL LOW (ref 8.9–10.3)
Chloride: 107 mmol/L (ref 98–111)
Creatinine, Ser: 0.5 mg/dL — ABNORMAL LOW (ref 0.61–1.24)
GFR, Estimated: >60 mL/min (ref >60)
Glucose, Bld: 104 mg/dL — ABNORMAL HIGH (ref 70–99)
Potassium: 4.2 mmol/L (ref 3.5–5.1)
Sodium: 140 mmol/L (ref 135–145)

## 2023-04-27 LAB — AMMONIA: Ammonia: 27 umol/L (ref 9–35)

## 2023-04-27 LAB — VITAMIN B12: Vitamin B-12: 741 pg/mL (ref 180–914)

## 2023-04-27 LAB — GLUCOSE, CAPILLARY
Glucose-Capillary: 92 mg/dL (ref 70–99)
Glucose-Capillary: 107 mg/dL — ABNORMAL HIGH (ref 70–99)
Glucose-Capillary: 117 mg/dL — ABNORMAL HIGH (ref 70–99)
Glucose-Capillary: 107 mg/dL — ABNORMAL HIGH (ref 70–99)
Glucose-Capillary: 94 mg/dL (ref 70–99)

## 2023-04-27 LAB — CBC WITH DIFFERENTIAL/PLATELET
Abs Immature Granulocytes: 0.06 10*3/uL (ref 0.00–0.07)
Basophils Absolute: 0 10*3/uL (ref 0.0–0.1)
Basophils Relative: 0 %
Eosinophils Absolute: 0 10*3/uL (ref 0.0–0.5)
Eosinophils Relative: 0 %
HCT: 38.3 % — ABNORMAL LOW (ref 39.0–52.0)
Hemoglobin: 12.8 g/dL — ABNORMAL LOW (ref 13.0–17.0)
Immature Granulocytes: 1 %
Lymphocytes Relative: 8 %
Lymphs Abs: 0.7 10*3/uL (ref 0.7–4.0)
MCH: 32.7 pg (ref 26.0–34.0)
MCHC: 33.4 g/dL (ref 30.0–36.0)
MCV: 98 fL (ref 80.0–100.0)
Monocytes Absolute: 0.8 10*3/uL (ref 0.1–1.0)
Monocytes Relative: 9 %
Neutro Abs: 7 10*3/uL (ref 1.7–7.7)
Neutrophils Relative %: 82 %
Platelets: 304 10*3/uL (ref 150–400)
RBC: 3.91 MIL/uL — ABNORMAL LOW (ref 4.22–5.81)
RDW: 14.4 % (ref 11.5–15.5)
WBC: 8.6 10*3/uL (ref 4.0–10.5)
nRBC: 0 % (ref 0.0–0.2)

## 2023-04-27 LAB — PHOSPHORUS
Phosphorus: 3.6 mg/dL (ref 2.5–4.6)
Phosphorus: 3.2 mg/dL (ref 2.5–4.6)

## 2023-04-27 LAB — CBC
HCT: 43 % (ref 39.0–52.0)
Hemoglobin: 14.2 g/dL (ref 13.0–17.0)
MCH: 32.5 pg (ref 26.0–34.0)
MCHC: 33 g/dL (ref 30.0–36.0)
MCV: 98.4 fL (ref 80.0–100.0)
Platelets: 303 10*3/uL (ref 150–400)
RBC: 4.37 MIL/uL (ref 4.22–5.81)
RDW: 14.5 % (ref 11.5–15.5)
WBC: 9 10*3/uL (ref 4.0–10.5)
nRBC: 0 % (ref 0.0–0.2)

## 2023-04-27 LAB — PROCALCITONIN: Procalcitonin: 0.22 ng/mL

## 2023-04-27 LAB — SARS CORONAVIRUS 2 BY RT PCR: SARS Coronavirus 2 by RT PCR: NEGATIVE

## 2023-04-27 LAB — CK: Total CK: 797 U/L — ABNORMAL HIGH (ref 49–397)

## 2023-04-27 LAB — MAGNESIUM: Magnesium: 2.3 mg/dL (ref 1.7–2.4)

## 2023-04-27 MED ORDER — CYPROHEPTADINE HCL 2 MG/5ML PO SYRP: 12.0000 mg | ORAL_SOLUTION | Freq: Once | ORAL | Status: DC

## 2023-04-27 MED ORDER — CYPROHEPTADINE HCL 4 MG PO TABS
12.0000 mg | ORAL_TABLET | Freq: Once | ORAL | Status: DC
  Filled 2023-04-27: qty 3

## 2023-04-27 MED ORDER — CLONAZEPAM 1 MG PO TABS
2.0000 mg | ORAL_TABLET | Freq: Four times a day (QID) | ORAL | Status: DC
  Administered 2023-04-27 – 2023-04-28 (×2): 2 mg
  Filled 2023-04-27 (×2): qty 2

## 2023-04-27 NOTE — Progress Notes (Signed)
                                                     Palliative Care Progress Note, Assessment & Plan   Patient Name: Jared Mayer       Date: 04/27/2023 DOB: 09-19-1970  Age: 52 y.o. MRN#: [id number] Attending Physician: [name], MD Primary Care Physician: [facility] Ad[facility] Date: 04/08/2023  Subjective: Patient is lying in bed on his right side.  He is making vocalizations but does not open his eyes during my visit.  He does not acknowledge my presence and is not able to make his wishes clearly known at this time.  No family or friends present during my visit.  HPI: 52 y.o. male  with past medical history of intellectual disability, schizoaffective disorder, drug-induced parkinsonism, HLD and hypothyroidism admitted from group home on 04/08/2023 with AMS, erratic behavior and recurrent falls.   Concern for neuroleptic malignant syndrome-has been started on bromocriptine EEG (-)   Palliative medicine was consulted for assisting with goals of care conversations.  Summary of counseling/coordination of care: Extensive chart review completed prior to meeting patient including labs, vital signs, imaging, progress notes, orders, and available advanced directive documents from current and previous encounters.   After reviewing the patient's chart and assessing the patient at bedside, I attempt to speak with patient regarding his symptom management.  He continued to repeat "do not do that".  He did respond to light touch by turning his head towards me but never opened his eyes.  Nonverbal signs of distress such as brow furrowing and wincing not noted.  No adjustment to [facility] needed at this time.  After visiting with the patient, attempted to speak with patient's mother Clydie Braun over the phone.  No answer.  I was unable to leave a voicemail.  PMT  remains available and will continue to follow and support patient throughout his hospitalization.  Physical Exam Vitals reviewed.  Constitutional:      General: He is not in acute distress.    Appearance: He is normal weight.  HENT:     Head: Normocephalic.     Nose: Nose normal.     Mouth/Throat:     Mouth: Mucous membranes are moist.  Cardiovascular:     Rate and Rhythm: Normal rate.  Abdominal:     Palpations: Abdomen is soft.  Musculoskeletal:     Comments: Generalized weakness  Skin:    General: Skin is warm and dry.             Total Time 25 minutes   Time spent includes: Detailed review of medical records (labs, imaging, vital signs), medically appropriate exam (mental status, respiratory, cardiac, skin), discussed with treatment team, counseling and educating patient, family and staff, documenting clinical information, medication management and coordination of care.  Samara Deist L. [name], DNP, FNP-BC Palliative Medicine Team

## 2023-04-27 NOTE — Progress Notes (Signed)
   04/27/23 1525  Assess: MEWS Score  Temp 100.3 F (37.9 C)  BP 116/72  MAP (mmHg) 85  Pulse Rate (!) 129  Resp 20  SpO2 98 %  Assess: MEWS Score  MEWS Temp 0  MEWS Systolic 0  MEWS Pulse 2  MEWS RR 0  MEWS LOC 0  MEWS Score 2  MEWS Score Color Yellow  Assess: if the MEWS score is Yellow or Red  Were vital signs accurate and taken at a resting state? No, vital signs rechecked  Does the patient meet 2 or more of the SIRS criteria? Yes  Does the patient have a confirmed or suspected source of infection? No  MEWS guidelines implemented  Yes, yellow  Treat  MEWS Interventions Considered administering scheduled or prn medications/treatments as ordered  Take Vital Signs  Increase Vital Sign Frequency  Yellow: Q2hr x1, continue Q4hrs until patient remains green for 12hrs  Escalate  MEWS: Escalate Yellow: Discuss with charge nurse and consider notifying provider and/or RRT  Notify: Charge Nurse/RN  Name of Charge Nurse/RN Notified Ale, RN  Provider Notification  Provider Name/Title Dr. Fran Lowes  Date Provider Notified 04/27/23  Time Provider Notified 1557  Method of Notification  (secure chat)  Assess: SIRS CRITERIA  SIRS Temperature  0  SIRS Respirations  0  SIRS Pulse 1  SIRS WBC 0  SIRS Score Sum  1   Patient temp spike while needing a bed bath with large amount of stool. I will receck in about an hour

## 2023-04-27 NOTE — TOC Progression Note (Addendum)
Transition of Care [facility]) - Progression Note    Patient Details  Name: Jared Mayer MRN: [id number] Date of Birth: 07-Jul-1970  Transition of Care [facility]) CM/SW Contact  Margarito Liner, LCSW Phone Number: 04/27/2023, 10:33 AM  Clinical Narrative:   CSW left voicemail for mom to see if she would like to accept the bed offer from [facility]  12:27 pm: Received call back from patient's mother. She has accepted the bed offer from [facility].  Expected Discharge Plan: Assisted Living Barriers to Discharge: Continued Medical Work up  Expected Discharge Plan and Services       Living arrangements for the past 2 months: Assisted Living Facility                                       Social Determinants of Health (SDOH) Interventions SDOH Screenings   Food Insecurity: No Food Insecurity (04/12/2023)  Housing: Patient Unable To Answer (04/12/2023)  Transportation Needs: Patient Unable To Answer (04/12/2023)  Utilities: Patient Unable To Answer (04/12/2023)  Financial Resource Strain: Low Risk  (03/29/2018)  Physical Activity: Unknown (03/29/2018)  Social Connections: Unknown (03/29/2018)  Stress: No Stress Concern Present (03/29/2018)  Tobacco Use: Low Risk  (04/08/2023)    Readmission Risk Interventions     No data to display

## 2023-04-27 NOTE — Progress Notes (Signed)
   04/27/23 1725  Assess: MEWS Score  Temp (!) 101.4 F (38.6 C)  BP 132/79  MAP (mmHg) 93  Pulse Rate (!) 128  Resp 20  SpO2 96 %  O2 Device Room Air  Assess: MEWS Score  MEWS Temp 1  MEWS Systolic 0  MEWS Pulse 2  MEWS RR 0  MEWS LOC 0  MEWS Score 3  MEWS Score Color Yellow  Assess: if the MEWS score is Yellow or Red  Were vital signs accurate and taken at a resting state? No, vital signs rechecked  Does the patient meet 2 or more of the SIRS criteria? Yes  Does the patient have a confirmed or suspected source of infection? No  MEWS guidelines implemented  Yes, yellow  Treat  MEWS Interventions Considered administering scheduled or prn medications/treatments as ordered  Take Vital Signs  Increase Vital Sign Frequency  Yellow: Q2hr x1, continue Q4hrs until patient remains green for 12hrs  Escalate  MEWS: Escalate Yellow: Discuss with charge nurse and consider notifying provider and/or RRT  Notify: Charge Nurse/RN  Name of Charge Nurse/RN Notified Ale, RN  Provider Notification  Provider Name/Title Dr. Fran Lowes  Date Provider Notified 04/27/23  Time Provider Notified 1739  Method of Notification  (secure chat)  Provider response See new orders  Date of Provider Response 04/27/23  Time of Provider Response 1739  Assess: SIRS CRITERIA  SIRS Temperature  1  SIRS Respirations  0  SIRS Pulse 1  SIRS WBC 0  SIRS Score Sum  2

## 2023-04-27 NOTE — Consult Note (Signed)
PHARMACY CONSULT NOTE - ELECTROLYTES  Pharmacy Consult for Electrolyte Monitoring and Replacement   Recent Labs: Weight: 81.4 kg (179 lb 7.3 oz) Estimated Creatinine Clearance: 104.5 mL/min (A) (by C-G formula based on SCr of 0.5 mg/dL (L)). Potassium (mmol/L)  Date Value  04/27/2023 4.2   Magnesium (mg/dL)  Date Value  16/02/9603 2.3   Calcium (mg/dL)  Date Value  54/01/8118 8.2 (L)   Albumin (g/dL)  Date Value  14/78/2956 2.4 (L)   Phosphorus (mg/dL)  Date Value  21/30/8657 3.6   Sodium (mmol/L)  Date Value  04/27/2023 140   Corrected Ca: 8.6 mg/dL  Assessment  Jared Mayer is a 52 y.o. male presenting with Neuroleptic Malignant Syndrome. PMH significant for schizoaffective disorder, HLD, hypothyroidism, and intellectual disability. Pharmacy has been consulted to monitor and replace electrolytes.  Diet: continuous tube feeds MIVF:  on free water 30 ml q4H.   Goal of Therapy: Electrolytes WNL  Plan:  No replacement needed at this time.  F/u with AM labs.   Thank you for allowing pharmacy to be a part of this patient's care.  Angelique Blonder, PharmD Clinical Pharmacist 04/27/2023 1:25 PM

## 2023-04-27 NOTE — Progress Notes (Signed)
Progress Note   Patient: Jared Mayer ZOX:[id number] DOB: 08/26/1970 DOA: 04/08/2023     19 DOS: the patient was seen and examined on 04/27/2023   Brief hospital course: Jared Mayer is a 52 y.o. male with medical history significant for intellectual disability, schizoaffective disorder, dyslipidemia, drug-induced parkinsonism, hypothyroidism, presented to ED for evaluation of altered mental status, disorientation, repetitive speech pattern, erratic behavior and recurrent falls.  Reportedly, he had been more aggressive at the group home than usual admitted to hospitalist service for further management evaluation of altered mental status, fever.  During hospital stay, his infectious workup so far negative.  ID recommended to stop antibiotics.  Psychiatry evaluated the patient advised to stop clonidine psychotics.  He is at high risk for NMS.  Discussed with PCCM, started on bromocriptine for NMS treatment.    Assessment and Plan: Acute toxic metabolic encephalopathy Febrile illness, Tachycardia. SIRS Possible Neuroleptic malignant syndrome --seen by PCCM, psych, and ID.  Has been on scheduled tylenol, cooling blankets, and scheduled bromocriptine, however, still spiking fever, 101.4 today. Plan: --continue Bromocriptine 5 mg q8hr. --cont scheduled tylenol, cold IV fluids, cooling blanket for hypothermia --Continue to hold all antipsychotic medications. --repeat infectious workup --Neuro consult --schedule clonazepam 2 mg q6h, per neuro rec  Hypokalemia- Hypophos --monitor and supplement PRN  Tube feed dependent --cont tube feed  Rhabdomyolysis Elevated LFT In the setting of use of antipsychotic medications, NMS.  Hold off antipsychotics, hepatotoxic medications. CK trended down.  --repeat CK today  Schizoaffective disorder: Psychiatry follow up appreciated. --hold antipsychotics  Hypothyroidism --TSH wnl --cont Synthroid     Continue NG care, tube feeds and free  water. Nursing supportive care. Fall, aspiration precautions. DVT prophylaxis - Lovenox   Code Status: Full Code Prognosis guarded.  Subjective:  Pt was wailing continuously today.  Spiked another fever.   Physical Exam: Vitals:   04/27/23 0411 04/27/23 0733 04/27/23 1525 04/27/23 1725  BP:  118/89 116/72 132/79  Pulse:  (!) 105 (!) 129 (!) 128  Resp:  18 20 20   Temp:  98.6 F (37 C) 100.3 F (37.9 C) (!) 101.4 F (38.6 C)  TempSrc:   Rectal Rectal  SpO2:  100% 98% 96%  Weight: 81.4 kg       Constitutional: NAD, wailing HEENT: conjunctivae and lids normal CV: No cyanosis.   RESP: normal respiratory effort Extremities: BLE in contracted position SKIN: warm, dry   Data Reviewed:      Latest Ref Rng & Units 04/27/2023    7:04 PM 04/27/2023    6:04 AM 04/26/2023    7:56 AM  CBC  WBC 4.0 - 10.5 K/uL 8.6  9.0  9.1   Hemoglobin 13.0 - 17.0 g/dL 81.1  91.4  78.2   Hematocrit 39.0 - 52.0 % 38.3  43.0  39.1   Platelets 150 - 400 K/uL 304  303  280       Latest Ref Rng & Units 04/27/2023    7:04 PM 04/27/2023    6:04 AM 04/26/2023    7:56 AM  BMP  Glucose 70 - 99 mg/dL 956  213  086   BUN 6 - 20 mg/dL 26  26  32   Creatinine 0.61 - 1.24 mg/dL 5.78  4.69  6.29   Sodium 135 - 145 mmol/L 138  140  142   Potassium 3.5 - 5.1 mmol/L 4.0  4.2  4.5   Chloride 98 - 111 mmol/L 104  107  108   CO2 22 -  32 mmol/L 24  25  25    Calcium 8.9 - 10.3 mg/dL 8.0  8.2  8.4    No results found.  Family Communication:   Disposition: Status is: Inpatient Remains inpatient appropriate because: Fever, AMS, NMS treatment.  Planned Discharge Destination: Skilled nursing facility  MDM level 3  Author: [name], MD 04/27/2023 7:57 PM Secure chat 7am to 7pm For on call review [email or web address].

## 2023-04-27 NOTE — Plan of Care (Signed)

## 2023-04-28 ENCOUNTER — Ambulatory Visit: Payer: Medicare HMO

## 2023-04-28 DIAGNOSIS — R4182 Altered mental status, unspecified: Secondary | ICD-10-CM | POA: Diagnosis not present

## 2023-04-28 LAB — RESPIRATORY PANEL BY PCR

## 2023-04-28 LAB — GLUCOSE, CAPILLARY
Glucose-Capillary: 77 mg/dL (ref 70–99)
Glucose-Capillary: 92 mg/dL (ref 70–99)
Glucose-Capillary: 92 mg/dL (ref 70–99)
Glucose-Capillary: 93 mg/dL (ref 70–99)
Glucose-Capillary: 88 mg/dL (ref 70–99)
Glucose-Capillary: 81 mg/dL (ref 70–99)

## 2023-04-28 MED ORDER — TRIAMCINOLONE 0.1 % CREAM:EUCERIN CREAM 1:1
TOPICAL_CREAM | Freq: Three times a day (TID) | CUTANEOUS | Status: DC | PRN
Start: 2023-04-28 — End: 2023-05-14

## 2023-04-28 MED ORDER — CLONAZEPAM 1 MG PO TABS
2.0000 mg | ORAL_TABLET | Freq: Three times a day (TID) | ORAL | Status: DC
  Administered 2023-04-28 – 2023-04-29 (×3): 2 mg
  Filled 2023-04-28 (×3): qty 2

## 2023-04-28 MED ORDER — FREE WATER
150.0000 mL | Status: DC
  Administered 2023-04-28 – 2023-05-03 (×29): 150 mL

## 2023-04-28 NOTE — Progress Notes (Signed)
Progress Note   Patient: Jared Mayer ZOX:[id number] DOB: 1970/11/14 DOA: 04/08/2023     21 DOS: the patient was seen and examined on 04/29/2023   Brief hospital course: Jared Mayer is a 52 y.o. male with medical history significant for intellectual disability, schizoaffective disorder, dyslipidemia, drug-induced parkinsonism, hypothyroidism, presented to ED for evaluation of altered mental status, disorientation, repetitive speech pattern, erratic behavior and recurrent falls.  Reportedly, he had been more aggressive at the group home than usual admitted to hospitalist service for further management evaluation of altered mental status, fever.  During hospital stay, his infectious workup so far negative.  ID recommended to stop antibiotics.  Psychiatry evaluated the patient advised to stop clonidine psychotics.  He is at high risk for NMS.  Discussed with PCCM, started on bromocriptine for NMS treatment.    Assessment and Plan: Acute toxic metabolic encephalopathy Febrile illness, Tachycardia. Likely Neuroleptic malignant syndrome --seen by PCCM, psych, and ID.  Has been on scheduled tylenol, cooling blankets, and scheduled bromocriptine, however, still spiking fever, 101.4 on 12/17.  Repeat infectious workup unremarkable. --neuro consulted with Dr. Iver Nestle, who agreed that the constellation of symptoms are most consistent with neuroleptic malignant syndrome, and Neuroleptic malignant syndrome can take anywhere from 2 days to 2 weeks to resolve. Plan: --continue Bromocriptine 5 mg q8hr. --cont scheduled tylenol, cold IV fluids, cooling blanket  --Continue to hold all antipsychotic medications, would not restart any without reinvolvement of psychiatry  --schedule clonazepam 2 mg q8h, per neuro rec --EEG --cont to trend CK  Hypokalemia- Hypophos --monitor and supplement PRN  Tube feed dependent --cont tube feed via Dobhoff tube --mother to consider G-tube  placement  Rhabdomyolysis Elevated LFT In the setting of use of antipsychotic medications, NMS.  Hold off antipsychotics, hepatotoxic medications. CK trended down then back up again. --cont to trend CK  Schizoaffective disorder: Psychiatry follow up appreciated. --Continue to hold all antipsychotic medications, would not restart any without reinvolvement of psychiatry   Hypothyroidism --TSH wnl --cont Synthroid     Continue NG care, tube feeds and free water. Nursing supportive care. Fall, aspiration precautions. DVT prophylaxis - Lovenox   Code Status: Full Code Prognosis guarded.  Subjective:  Still not responsive today.  Neuro consulted.  Palliative care discussed G-tube with mother.   Physical Exam: Vitals:   04/28/23 0513 04/28/23 0935 04/28/23 1427 04/28/23 2029  BP: 122/68 129/72 124/67 (!) 107/53  Pulse: 62 79 78 92  Resp: 20 18 18 16   Temp: 99.5 F (37.5 C) 98.6 F (37 C) 98.1 F (36.7 C) 98.2 F (36.8 C)  TempSrc:  Rectal  Oral  SpO2: 100% 99% 99% 100%  Weight:        Constitutional: NAD, eyes closed HEENT: conjunctivae and lids normal, EOMI CV: No cyanosis.   RESP: normal respiratory effort, on RA Extremities: extremities in contracted positions SKIN: warm, dry   Data Reviewed:      Latest Ref Rng & Units 04/27/2023    7:04 PM 04/27/2023    6:04 AM 04/26/2023    7:56 AM  CBC  WBC 4.0 - 10.5 K/uL 8.6  9.0  9.1   Hemoglobin 13.0 - 17.0 g/dL 81.1  91.4  78.2   Hematocrit 39.0 - 52.0 % 38.3  43.0  39.1   Platelets 150 - 400 K/uL 304  303  280       Latest Ref Rng & Units 04/27/2023    7:04 PM 04/27/2023    6:04 AM 04/26/2023  7:56 AM  BMP  Glucose 70 - 99 mg/dL 629  528  413   BUN 6 - 20 mg/dL 26  26  32   Creatinine 0.61 - 1.24 mg/dL 2.44  0.10  2.72   Sodium 135 - 145 mmol/L 138  140  142   Potassium 3.5 - 5.1 mmol/L 4.0  4.2  4.5   Chloride 98 - 111 mmol/L 104  107  108   CO2 22 - 32 mmol/L 24  25  25    Calcium 8.9 - 10.3  mg/dL 8.0  8.2  8.4    EEG adult Result Date: 04/28/2023 Charlsie Quest, MD     04/28/2023  2:15 PM Patient Name: Jared Mayer MRN: [id number] Epilepsy Attending: Charlsie Quest Referring Physician/Provider: Gordy Councilman, MD Date: 04/28/2023 Duration: 1 hour Patient history: 53yo M with ams getting eeg to evaluate for seizure Level of alertness: Awake, asleep AEDs during EEG study: Klonopin Technical aspects: This EEG study was done with scalp electrodes positioned according to the 10-20 International system of electrode placement. Electrical activity was reviewed with band pass filter of 1-70Hz , sensitivity of 7 uV/mm, display speed of 52mm/sec with a 60Hz  notched filter applied as appropriate. EEG data were recorded continuously and digitally stored.  Video monitoring was available and reviewed as appropriate. Description: The posterior dominant rhythm consists of 9-10 Hz activity of moderate voltage (25-35 uV) seen predominantly in posterior head regions, symmetric and reactive to eye opening and eye closing. Sleep was characterized by vertex waves, sleep spindles (12 to 14 Hz), maximal frontocentral region. There is an excessive amount of 15 to 18 Hz beta activity distributed symmetrically and diffusely. Hyperventilation and photic stimulation were not performed.   ABNORMALITY - Excessive beta, generalized IMPRESSION: This study is within normal limits. The excessive beta activity seen in the background is most likely due to the effect of benzodiazepine and is a benign EEG pattern. No seizures or epileptiform discharges were seen throughout the recording. A normal interictal EEG does not exclude the diagnosis of epilepsy. Priyanka [name]   CT HEAD WO CONTRAST ( ) Result Date: 04/28/2023 CLINICAL DATA:  Altered mental status EXAM: CT HEAD WITHOUT CONTRAST TECHNIQUE: Contiguous axial images were obtained from the base of the skull through the vertex without intravenous contrast. RADIATION DOSE  REDUCTION: This exam was performed according to the departmental dose-optimization program which includes automated exposure control, adjustment of the mA and/or kV according to patient size and/or use of iterative reconstruction technique. COMPARISON:  None Available. FINDINGS: Brain: No mass,hemorrhage or extra-axial collection. Normal appearance of the parenchyma and CSF spaces. Vascular: No hyperdense vessel or unexpected vascular calcification. Skull: The visualized skull base, calvarium and extracranial soft tissues are normal. Sinuses/Orbits: No fluid levels or advanced mucosal thickening of the visualized paranasal sinuses. No mastoid or middle ear effusion. Normal orbits. IMPRESSION: Normal head CT. Electronically Signed   By: Deatra Robinson M.D.   On: 04/28/2023 02:28    Family Communication:   Disposition: Status is: Inpatient Remains inpatient appropriate because: Fever, AMS, NMS treatment.  Planned Discharge Destination: Skilled nursing facility  MDM level 3  Author: [name], MD 04/29/2023 12:30 AM Secure chat 7am to 7pm For on call review [email or web address].

## 2023-04-28 NOTE — Plan of Care (Signed)

## 2023-04-28 NOTE — Procedures (Signed)
Patient Name: Jared Mayer  MRN: [id number]  Epilepsy Attending: Charlsie Quest  Referring Physician/Provider: Gordy Councilman, MD  Date: 04/28/2023 Duration: 1 hour  Patient history: 52yo M with ams getting eeg to evaluate for seizure  Level of alertness: Awake, asleep  AEDs during EEG study: Klonopin  Technical aspects: This EEG study was done with scalp electrodes positioned according to the 10-20 International system of electrode placement. Electrical activity was reviewed with band pass filter of 1-70Hz , sensitivity of 7 uV/mm, display speed of 71mm/sec with a 60Hz  notched filter applied as appropriate. EEG data were recorded continuously and digitally stored.  Video monitoring was available and reviewed as appropriate.  Description: The posterior dominant rhythm consists of 9-10 Hz activity of moderate voltage (25-35 uV) seen predominantly in posterior head regions, symmetric and reactive to eye opening and eye closing. Sleep was characterized by vertex waves, sleep spindles (12 to 14 Hz), maximal frontocentral region. There is an excessive amount of 15 to 18 Hz beta activity distributed symmetrically and diffusely. Hyperventilation and photic stimulation were not performed.     ABNORMALITY - Excessive beta, generalized  IMPRESSION: This study is within normal limits. The excessive beta activity seen in the background is most likely due to the effect of benzodiazepine and is a benign EEG pattern. No seizures or epileptiform discharges were seen throughout the recording.  A normal interictal EEG does not exclude the diagnosis of epilepsy.  Jared Mayer [name]

## 2023-04-28 NOTE — Progress Notes (Signed)
Eeg done 

## 2023-04-28 NOTE — Consult Note (Signed)
PHARMACY CONSULT NOTE - ELECTROLYTES  Pharmacy Consult for Electrolyte Monitoring and Replacement   Recent Labs: Weight: 82.5 kg (181 lb 14.1 oz) Estimated Creatinine Clearance: 113.1 mL/min (A) (by C-G formula based on SCr of 0.36 mg/dL (L)). Potassium (mmol/L)  Date Value  04/27/2023 4.0   Magnesium (mg/dL)  Date Value  78/29/5621 2.3   Calcium (mg/dL)  Date Value  30/86/5784 8.0 (L)   Albumin (g/dL)  Date Value  69/62/9528 2.3 (L)   Phosphorus (mg/dL)  Date Value  41/32/4401 3.2   Sodium (mmol/L)  Date Value  04/27/2023 138   Corrected Ca: 8.6 mg/dL  Assessment  Jared Mayer is a 52 y.o. male presenting with Neuroleptic Malignant Syndrome. PMH significant for schizoaffective disorder, HLD, hypothyroidism, and intellectual disability. Pharmacy has been consulted to monitor and replace electrolytes.  Diet: continuous tube feeds MIVF:  on free water 30 ml q4H.   Goal of Therapy: Electrolytes WNL  Plan:  No replacement needed at this time.  F/u with AM labs.   Thank you for allowing pharmacy to be a part of this patient's care.  Angelique Blonder, PharmD Clinical Pharmacist 04/28/2023 8:34 AM
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
## 2023-04-28 NOTE — Progress Notes (Signed)
                                                     Palliative Care Progress Note, Assessment & Plan   Patient Name: Jared Mayer       Date: 04/28/2023 DOB: January 11, 1971  Age: 52 y.o. MRN#: [id number] Attending Physician: [name], MD Primary Care Physician: [facility] Admit Date: 04/08/2023  Subjective: Unable to complete assessment as patient is off the unit in EEG.   HPI: 52 y.o. male  with past medical history of intellectual disability, schizoaffective disorder, drug-induced parkinsonism, HLD and hypothyroidism admitted from group home on 04/08/2023 with AMS, erratic behavior and recurrent falls.   Concern for neuroleptic malignant syndrome-has been started on bromocriptine.   Palliative medicine was consulted for assisting with goals of care conversations.  Summary of counseling/coordination of care: Extensive chart review completed prior to meeting patient including labs, vital signs, imaging, progress notes, orders, and available advanced directive documents from current and previous encounters.   After reviewing the patient's chart and assessing the patient at bedside, spoke with patient's attending, registered dietitian, and RN in regards to plan of care.  Today is patient's 19th day of hospitalization.  Patient has NG tube in place for nutritional support.  Alternative means needs to be considered since and she is not a permanent/long-term form of nutritional support.  I spoke with patient's mother/legal guardian over the phone.  Pros and cons of PEG tube placement reviewed.  Extensive discussion regarding quality versus quantity of life.  Discussed remaining hopeful that patient can continue to show signs of cognitive improvement and return to baseline of eating and drinking independently.  However, if patient does not return to  baseline then decision needs to be made whether or not artificial nutrition and hydration should be given.  Patient's mother was appreciative of our discussion.  Space and opportunity provided for her to ask questions and share her thoughts.  She shares she would like to consider it and discuss further tomorrow.  PMT remains available to patient and family throughout his hospitalization.  Ongoing discussions and support to continue.  No adjustment to plan of care at this time.  Samara Deist L. [name], DNP, FNP-BC Palliative Medicine Team  No charge

## 2023-04-28 NOTE — Care Management Important Message (Signed)
Important Message  Patient Details  Name: Jared Mayer MRN: [id number] Date of Birth: 1970/10/03   Important Message Given:  Yes - Medicare IM     Aws Shere, Stephan Minister 04/28/2023, 2:53 PM

## 2023-04-28 NOTE — Progress Notes (Addendum)
Nutrition Follow-up  DOCUMENTATION CODES:   Not applicable  INTERVENTION:   -TF via NGT:    Osmolite 1.5 @ 50 ml/hr     60 ml Prosource TF BID   150 ml free water flush every 4 hours to maintain tube patency   Tube feeding regimen provides 1960 kcal (100% of needs), 115 grams of protein, and 912 ml of H2O. Total free water: 1992 ml daily -Continue MVI with minerals daily via tube -Continue 100 mg thiamine daily x 7 days via tube -Monitor Mg, K, and Phos and replete as needed secondary to high refeeding risk; pharmacy following for electrolyte management   NUTRITION DIAGNOSIS:   Inadequate oral intake related to inability to eat as evidenced by NPO status.  Ongoing  GOAL:   Patient will meet greater than or equal to 90% of their needs  Met with TF  MONITOR:   Diet advancement, TF tolerance  REASON FOR ASSESSMENT:   Consult Enteral/tube feeding initiation and management  ASSESSMENT:   Pt with medical history significant for schizoaffective disorder, bipolar type, dyslipidemia, drug-induced parkinsonism, and hypothyroidism, who presented with acute onset of altered mental status with recent recurrent falls and unsteady gait with worsening disorientation and repetitive speech pattern as well as erratic behavior.  11/30- s/p BSE- advanced diet to dysphagia 3 diet with thin liquids  12/11- s/p BSE- downgraded to NPO secondary to mental status, s/p EEG- WDL, s/p NGT placement under fluoroscopy (tip of tube in gastric body)  Reviewed I/O's: +654 ml x 24 hours and +3.6 L since 04/14/23  UOP: 500 ml x 24 hours  Pt lying in bed at time of visit. He did not respond to voice or touch. Noted NGT present and Osmolite 1.5 infusing at goal rate of 50 ml/hr.   Case discussed with RN, MD, and palliative care. Discussed concerns over mental status and continues to be inappropriate for oral intake. Per RN assessment, pt is not as contracted, arms are somewhat more relaxed but not  awake. Recommended considering permanent feeding access (ex PEG) as pt would be unable to d/c to SNF with NGT.   Noted mild wt gain trend over the past 5 days. Suspect this is related to edema and fluid retention (+3.6 L since 04/14/23).   Per TOC notes, plan for SNF placement [facility]) once medically stable for discharge.   Medications reviewed and include colace, lovenox, protamine, miralax, thiamine, and vitamin D.  Labs reviewed: K, Mg, and Phos WDL. CBGS: 77-107 (inpatient orders for glycemic control are none).    NUTRITION - FOCUSED PHYSICAL EXAM:  Flowsheet Row Most Recent Value  Orbital Region Moderate depletion  Upper Arm Region No depletion  Thoracic and Lumbar Region No depletion  Buccal Region No depletion  Temple Region Moderate depletion  Clavicle Bone Region Mild depletion  Clavicle and Acromion Bone Region No depletion  Scapular Bone Region No depletion  Dorsal Hand No depletion  Patellar Region No depletion  Anterior Thigh Region No depletion  Posterior Calf Region Moderate depletion  Edema (RD Assessment) Mild  Hair Reviewed  Eyes Reviewed  Mouth Reviewed  Skin Reviewed  Nails Reviewed       Diet Order:   Diet Order             Diet NPO time specified  Diet effective now                   EDUCATION NEEDS:   Not appropriate for education at this  time  Skin:  Skin Assessment: Reviewed RN Assessment  Last BM:  04/27/23 (type 7)  Height:   Ht Readings from Last 1 Encounters:  04/03/23 5\' 8"  (1.727 m)    Weight:   Wt Readings from Last 1 Encounters:  04/28/23 82.5 kg    Ideal Body Weight:  70 kg  BMI:  Body mass index is 27.65 kg/m.  Estimated Nutritional Needs:   Kcal:  1900-2100  Protein:  105-120 grams  Fluid:  > 1.9 L    Jared Mayer, RD, LDN, CDCES Registered Dietitian III Certified Diabetes Care and Education Specialist If unable to reach this RD, please use "RD Inpatient" group chat on secure chat between  hours of 8am-4 pm daily

## 2023-04-29 DIAGNOSIS — R4182 Altered mental status, unspecified: Secondary | ICD-10-CM | POA: Diagnosis not present

## 2023-04-29 DIAGNOSIS — M6282 Rhabdomyolysis: Secondary | ICD-10-CM | POA: Diagnosis not present

## 2023-04-29 DIAGNOSIS — G2402 Drug induced acute dystonia: Secondary | ICD-10-CM

## 2023-04-29 DIAGNOSIS — G2111 Neuroleptic induced parkinsonism: Secondary | ICD-10-CM

## 2023-04-29 DIAGNOSIS — G210 Malignant neuroleptic syndrome: Secondary | ICD-10-CM | POA: Diagnosis not present

## 2023-04-29 DIAGNOSIS — T43505A Adverse effect of unspecified antipsychotics and neuroleptics, initial encounter: Secondary | ICD-10-CM

## 2023-04-29 DIAGNOSIS — F250 Schizoaffective disorder, bipolar type: Secondary | ICD-10-CM | POA: Diagnosis not present

## 2023-04-29 DIAGNOSIS — A419 Sepsis, unspecified organism: Secondary | ICD-10-CM | POA: Diagnosis not present

## 2023-04-29 DIAGNOSIS — R6510 Systemic inflammatory response syndrome (SIRS) of non-infectious origin without acute organ dysfunction: Secondary | ICD-10-CM | POA: Diagnosis not present

## 2023-04-29 LAB — CK: Total CK: 424 U/L — ABNORMAL HIGH (ref 49–397)

## 2023-04-29 LAB — GLUCOSE, CAPILLARY
Glucose-Capillary: 71 mg/dL (ref 70–99)
Glucose-Capillary: 78 mg/dL (ref 70–99)
Glucose-Capillary: 86 mg/dL (ref 70–99)
Glucose-Capillary: 94 mg/dL (ref 70–99)
Glucose-Capillary: 96 mg/dL (ref 70–99)
Glucose-Capillary: 99 mg/dL (ref 70–99)

## 2023-04-29 LAB — CBC
HCT: 39.8 % (ref 39.0–52.0)
Hemoglobin: 13.1 g/dL (ref 13.0–17.0)
MCH: 32.3 pg (ref 26.0–34.0)
MCHC: 32.9 g/dL (ref 30.0–36.0)
MCV: 98.3 fL (ref 80.0–100.0)
Platelets: 356 10*3/uL (ref 150–400)
RBC: 4.05 MIL/uL — ABNORMAL LOW (ref 4.22–5.81)
RDW: 14.3 % (ref 11.5–15.5)
WBC: 8.8 10*3/uL (ref 4.0–10.5)
nRBC: 0 % (ref 0.0–0.2)

## 2023-04-29 LAB — BASIC METABOLIC PANEL
Anion gap: 9 (ref 5–15)
BUN: 22 mg/dL — ABNORMAL HIGH (ref 6–20)
CO2: 25 mmol/L (ref 22–32)
Calcium: 8.2 mg/dL — ABNORMAL LOW (ref 8.9–10.3)
Chloride: 98 mmol/L (ref 98–111)
Creatinine, Ser: 0.55 mg/dL — ABNORMAL LOW (ref 0.61–1.24)
GFR, Estimated: >60 mL/min (ref >60)
Glucose, Bld: 107 mg/dL — ABNORMAL HIGH (ref 70–99)
Potassium: 4.9 mmol/L (ref 3.5–5.1)
Sodium: 132 mmol/L — ABNORMAL LOW (ref 135–145)

## 2023-04-29 LAB — MAGNESIUM: Magnesium: 2.1 mg/dL (ref 1.7–2.4)

## 2023-04-29 LAB — PHOSPHORUS: Phosphorus: 3.7 mg/dL (ref 2.5–4.6)

## 2023-04-29 MED ORDER — BENZTROPINE MESYLATE 0.5 MG PO TABS
1.0000 mg | ORAL_TABLET | Freq: Three times a day (TID) | ORAL | Status: DC
  Administered 2023-04-29 – 2023-04-30 (×3): 1 mg
  Filled 2023-04-29 (×3): qty 2

## 2023-04-29 MED ORDER — CLONAZEPAM 1 MG PO TABS
2.0000 mg | ORAL_TABLET | Freq: Four times a day (QID) | ORAL | Status: DC
  Administered 2023-04-29 – 2023-04-30 (×4): 2 mg
  Filled 2023-04-29 (×4): qty 2

## 2023-04-29 MED ORDER — BENZTROPINE MESYLATE 0.5 MG PO TABS
2.0000 mg | ORAL_TABLET | Freq: Once | ORAL | Status: AC
  Administered 2023-04-29: 2 mg
  Filled 2023-04-29: qty 4

## 2023-04-29 NOTE — Consult Note (Signed)
PHARMACY CONSULT NOTE - ELECTROLYTES  Pharmacy Consult for Electrolyte Monitoring and Replacement   Recent Labs: Weight: 83 kg (182 lb 15.7 oz) Estimated Creatinine Clearance: 113.4 mL/min (A) (by C-G formula based on SCr of 0.55 mg/dL (L)). Potassium (mmol/L)  Date Value  04/29/2023 4.9   Magnesium (mg/dL)  Date Value  05/13/7251 2.1   Calcium (mg/dL)  Date Value  66/44/0347 8.2 (L)   Albumin (g/dL)  Date Value  42/59/5638 2.3 (L)   Phosphorus (mg/dL)  Date Value  75/64/3329 3.7   Sodium (mmol/L)  Date Value  04/29/2023 132 (L)   Corrected Ca: 8.6 mg/dL  Assessment  Jared Mayer is a 52 y.o. male presenting with Neuroleptic Malignant Syndrome. PMH significant for schizoaffective disorder, HLD, hypothyroidism, and intellectual disability. Pharmacy has been consulted to monitor and replace electrolytes.  Diet: continuous tube feeds MIVF:  on free water 150 ml q4H.   Goal of Therapy: Electrolytes WNL  Plan:  No replacement needed at this time.  F/u with AM labs.   Thank you for allowing pharmacy to be a part of this patient's care.  Angelique Blonder, PharmD Clinical Pharmacist 04/29/2023 12:18 PM

## 2023-04-29 NOTE — Progress Notes (Addendum)
Palliative Care Progress Note, Assessment & Plan   Patient Name: Jared Mayer       Date: 04/29/2023 DOB: 10-06-1970  Age: 52 y.o. MRN#: [id number] Attending Physician: Marcelino Duster, MD Primary Care Physician: [facility] Admit Date: 04/08/2023  Subjective: Patient is lying in bed in no apparent distress.  He is asleep but easily awakens to my presence.  He opens his eyes and makes eye contact with me.  He is able to track me across the room.  He is certainly more interactive and alert during my rounds today than he has been the entirety of his hospitalization.  No family or friends present during my visit.  HPI: 52 y.o. male  with past medical history of intellectual disability, schizoaffective disorder, drug-induced parkinsonism, HLD and hypothyroidism admitted from group home on 04/08/2023 with AMS, erratic behavior and recurrent falls.   Concern for neuroleptic malignant syndrome-has been started on bromocriptine.   Palliative medicine was consulted for assisting with goals of care conversations.  Summary of counseling/coordination of care: Extensive chart review completed prior to meeting patient including labs, vital signs, imaging, progress notes, orders, and available advanced directive documents from current and previous encounters.   After reviewing the patient's chart and assessing the patient at bedside, I spoke with patient in regards to symptom management and plan of care.  Symptoms assessed.  Patient endorses mild right foot pain.  PE of right lower extremity was WNL.  I elevated his heel to avoid skin breakdown.  Patient did not wince or have increased pain with movement.  He denied pain or discomfort in any other way.  No adjustment to [facility] needed at this time.   Attending and RN made aware of patient's complaint of right foot pain.  I attempted to discuss boundaries and goals of care with patient.  We reviewed that patient has NG tube in place and that is providing artificial nutrition.  I shared that this is a temporary measure.  Medical staff is remaining hopeful that patient will continue to be more alert and awake, be able to participate in SLP evaluation, and hopefully be able to eat and drink a safe diet soon.  Patient nodded his head in agreement with this.  I discussed that if he is unable to do this we would have to move forward with artificial nutrition and hydration.  Patient shook his head no during this part of the discussion.  Space and opportunity provided for patient to share his thoughts.  He was not able to make vocalizations but was able to say yes/no appropriately to orientation questions.  We discussed we will continue to treat the treatable and monitor him for continued signs of his mental faculties improving.  He nodded in agreement.  After visiting with the patient, attempted to speak with his mother over the phone.  No answer.  HIPAA compliant voicemail with PMT contact info left.  Addendum: 1304-patient's mother returned my phone call.  Brief medical update given.  [name] was grateful for the update.  She did not have any palliative questions or concerns at this time.  PMT will continue to follow and support patient and family throughout his hospitalization.  No adjustment to  plan of care at this time.  Physical Exam Vitals reviewed.  Constitutional:      General: He is not in acute distress. HENT:     Head: Normocephalic.     Mouth/Throat:     Mouth: Mucous membranes are moist.  Eyes:     Pupils: Pupils are equal, round, and reactive to light.  Pulmonary:     Effort: Pulmonary effort is normal.  Abdominal:     Palpations: Abdomen is soft.  Skin:    General: Skin is warm and dry.  Neurological:     Mental Status: He is  alert.  Psychiatric:        Mood and Affect: Mood normal.        Behavior: Behavior normal.        Thought Content: Thought content normal.        Judgment: Judgment normal.             Total Time 35 minutes   Time spent includes: Detailed review of medical records (labs, imaging, vital signs), medically appropriate exam (mental status, respiratory, cardiac, skin), discussed with treatment team, counseling and educating patient, family and staff, documenting clinical information, medication management and coordination of care.  Jared Deist L. [name], DNP, FNP-BC Palliative Medicine Team
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
## 2023-04-29 NOTE — Progress Notes (Signed)
Progress Note   Patient: Jared Mayer GNF:[id number] DOB: Oct 30, 1970 DOA: 04/08/2023     21 DOS: the patient was seen and examined on 04/29/2023   Brief hospital course: [name] is a 52 y.o. male with medical history significant for intellectual disability, schizoaffective disorder, dyslipidemia, drug-induced parkinsonism, hypothyroidism, presented to ED for evaluation of altered mental status, disorientation, repetitive speech pattern, erratic behavior and recurrent falls.  Reportedly, he had been more aggressive at the group home than usual admitted to hospitalist service for further management evaluation of altered mental status.  Assessment and Plan: Acute toxic metabolic encephalopathy SIRS Neurolept malignant syndrome Fever improved. Mental status slow improvement. Neurology on board advised Clonazepam standing dose. Continue Bromocriptine 5 tid. Got a dose of benztropine, with 1mg  tid. Continue to hold all antipsychotics Continue to monitor electrolytes and replete accordingly. Continue gentle IV hydration (Cold IV fluids).  Scheduled Tylenol, cooling blanket for high grade fever. Continue tube feeds, meds. Palliative team on board to discuss with mother.  Hypokalemia- improved. Continue to monitor daily electrolytes.  Hypophosphatemia: Phos repleted. Oral phos daily replacement.  Rhabdomyolysis Elevated LFT In the setting of use of antipsychotic medications, NMS.  Hold off antipsychotics, hepatotoxic medications. LFT, CK improving. Continue to IV hydration.  Schizoaffective disorder: Psychiatry chart follow up appreciated. Hold current antipsychotics.     Nursing supportive care. Fall, aspiration precautions. DVT prophylaxis - Lovenox   Code Status: Full Code Prognosis guarded.  Subjective: Patient is seen and examined today morning. He is calm did not answer me. But later more alert, aware did complain of foot pain to palliative team. He is tolerating NG  feeds.  Physical Exam: Vitals:   04/29/23 0500 04/29/23 0844 04/29/23 1445 04/29/23 1939  BP:  (!) 107/57 103/60 (!) 89/51  Pulse:  86 (!) 105 79  Resp:  18 18 20   Temp:  98.1 F (36.7 C) 98.1 F (36.7 C) 98.1 F (36.7 C)  TempSrc:   Oral Oral  SpO2:  98% 100% 97%  Weight: 83 kg      General -Middle aged Caucasian male, sleepy, opens eyes HEENT - PERRLA, EOMI, atraumatic head, non tender sinuses. Lung - Clear, bibasal rales, rhonchi, no wheezes. Heart - S1, S2 heard, no murmurs, rubs, no pedal edema. Abdomen - Soft, non tender, bowel sounds good Neuro - opens eyes spontaneously, unable to do full neuroexam.. Skin - Warm and dry.  Data Reviewed:      Latest Ref Rng & Units 04/29/2023    7:58 AM 04/27/2023    7:04 PM 04/27/2023    6:04 AM  CBC  WBC 4.0 - 10.5 K/uL 8.8  8.6  9.0   Hemoglobin 13.0 - 17.0 g/dL 84.6  96.2  95.2   Hematocrit 39.0 - 52.0 % 39.8  38.3  43.0   Platelets 150 - 400 K/uL 356  304  303       Latest Ref Rng & Units 04/29/2023    7:58 AM 04/27/2023    7:04 PM 04/27/2023    6:04 AM  BMP  Glucose 70 - 99 mg/dL 841  324  401   BUN 6 - 20 mg/dL 22  26  26    Creatinine 0.61 - 1.24 mg/dL 0.27  2.53  6.64   Sodium 135 - 145 mmol/L 132  138  140   Potassium 3.5 - 5.1 mmol/L 4.9  4.0  4.2   Chloride 98 - 111 mmol/L 98  104  107   CO2 22 - 32 mmol/L  25  24  25    Calcium 8.9 - 10.3 mg/dL 8.2  8.0  8.2    EEG adult Result Date: 04/28/2023 Charlsie Quest, MD     04/28/2023  2:15 PM Patient Name: Jared Mayer MRN: [id number] Epilepsy Attending: Charlsie Quest Referring Physician/Provider: Gordy Councilman, MD Date: 04/28/2023 Duration: 1 hour Patient history: 52yo M with ams getting eeg to evaluate for seizure Level of alertness: Awake, asleep AEDs during EEG study: Klonopin Technical aspects: This EEG study was done with scalp electrodes positioned according to the 10-20 International system of electrode placement. Electrical activity was reviewed with  band pass filter of 1-70Hz , sensitivity of 7 uV/mm, display speed of 101mm/sec with a 60Hz  notched filter applied as appropriate. EEG data were recorded continuously and digitally stored.  Video monitoring was available and reviewed as appropriate. Description: The posterior dominant rhythm consists of 9-10 Hz activity of moderate voltage (25-35 uV) seen predominantly in posterior head regions, symmetric and reactive to eye opening and eye closing. Sleep was characterized by vertex waves, sleep spindles (12 to 14 Hz), maximal frontocentral region. There is an excessive amount of 15 to 18 Hz beta activity distributed symmetrically and diffusely. Hyperventilation and photic stimulation were not performed.   ABNORMALITY - Excessive beta, generalized IMPRESSION: This study is within normal limits. The excessive beta activity seen in the background is most likely due to the effect of benzodiazepine and is a benign EEG pattern. No seizures or epileptiform discharges were seen throughout the recording. A normal interictal EEG does not exclude the diagnosis of epilepsy. Priyanka [name]    Disposition: Status is: Inpatient Remains inpatient appropriate because: NMS treatment, close neuro follow up.  Planned Discharge Destination: Skilled nursing facility  Time spent 39 min.  Author: Marcelino Duster, MD 04/29/2023 9:05 PM Secure chat 7am to 7pm For on call review [email or web address].

## 2023-04-29 NOTE — Plan of Care (Signed)

## 2023-04-29 NOTE — Plan of Care (Signed)
  Problem: Respiratory: Goal: Ability to maintain adequate ventilation will improve Outcome: Progressing   Problem: Nutrition: Goal: Adequate nutrition will be maintained Outcome: Progressing   Problem: Elimination: Goal: Will not experience complications related to bowel motility Outcome: Progressing   Problem: Skin Integrity: Goal: Risk for impaired skin integrity will decrease Outcome: Progressing

## 2023-04-30 ENCOUNTER — Inpatient Hospital Stay: Payer: Medicare HMO

## 2023-04-30 DIAGNOSIS — F250 Schizoaffective disorder, bipolar type: Secondary | ICD-10-CM | POA: Diagnosis not present

## 2023-04-30 DIAGNOSIS — R4182 Altered mental status, unspecified: Secondary | ICD-10-CM | POA: Diagnosis not present

## 2023-04-30 DIAGNOSIS — M6282 Rhabdomyolysis: Secondary | ICD-10-CM | POA: Diagnosis not present

## 2023-04-30 DIAGNOSIS — R6510 Systemic inflammatory response syndrome (SIRS) of non-infectious origin without acute organ dysfunction: Secondary | ICD-10-CM | POA: Diagnosis not present

## 2023-04-30 DIAGNOSIS — Z515 Encounter for palliative care: Secondary | ICD-10-CM | POA: Diagnosis not present

## 2023-04-30 DIAGNOSIS — G210 Malignant neuroleptic syndrome: Secondary | ICD-10-CM | POA: Diagnosis not present

## 2023-04-30 LAB — CBC
HCT: 37.1 % — ABNORMAL LOW (ref 39.0–52.0)
Hemoglobin: 12.6 g/dL — ABNORMAL LOW (ref 13.0–17.0)
MCH: 32.7 pg (ref 26.0–34.0)
MCHC: 34 g/dL (ref 30.0–36.0)
MCV: 96.4 fL (ref 80.0–100.0)
Platelets: 374 10*3/uL (ref 150–400)
RBC: 3.85 MIL/uL — ABNORMAL LOW (ref 4.22–5.81)
RDW: 14.1 % (ref 11.5–15.5)
WBC: 7.8 10*3/uL (ref 4.0–10.5)
nRBC: 0 % (ref 0.0–0.2)

## 2023-04-30 LAB — GLUCOSE, CAPILLARY
Glucose-Capillary: 81 mg/dL (ref 70–99)
Glucose-Capillary: 98 mg/dL (ref 70–99)
Glucose-Capillary: 87 mg/dL (ref 70–99)
Glucose-Capillary: 95 mg/dL (ref 70–99)
Glucose-Capillary: 70 mg/dL (ref 70–99)
Glucose-Capillary: 69 mg/dL — ABNORMAL LOW (ref 70–99)
Glucose-Capillary: 99 mg/dL (ref 70–99)

## 2023-04-30 LAB — BASIC METABOLIC PANEL
Anion gap: 10 (ref 5–15)
BUN: 15 mg/dL (ref 6–20)
CO2: 24 mmol/L (ref 22–32)
Calcium: 8.2 mg/dL — ABNORMAL LOW (ref 8.9–10.3)
Chloride: 99 mmol/L (ref 98–111)
Creatinine, Ser: 0.45 mg/dL — ABNORMAL LOW (ref 0.61–1.24)
GFR, Estimated: >60 mL/min (ref >60)
Glucose, Bld: 97 mg/dL (ref 70–99)
Potassium: 4.5 mmol/L (ref 3.5–5.1)
Sodium: 133 mmol/L — ABNORMAL LOW (ref 135–145)

## 2023-04-30 LAB — CK: Total CK: 502 U/L — ABNORMAL HIGH (ref 49–397)

## 2023-04-30 MED ORDER — DEXTROSE 50 % IV SOLN: 12.5000 g | Freq: Once | INTRAVENOUS | Status: DC

## 2023-04-30 MED ORDER — CLONAZEPAM 1 MG PO TABS: 2.0000 mg | ORAL_TABLET | Freq: Three times a day (TID) | ORAL | Status: DC

## 2023-04-30 MED ORDER — METOCLOPRAMIDE HCL 5 MG/ML IJ SOLN: 10.0000 mg | Freq: Once | INTRAMUSCULAR | Status: DC

## 2023-04-30 MED ORDER — BENZTROPINE MESYLATE 0.5 MG PO TABS: 1.0000 mg | ORAL_TABLET | Freq: Two times a day (BID) | ORAL | Status: DC

## 2023-04-30 MED ORDER — CLONAZEPAM 1 MG PO TABS
2.0000 mg | ORAL_TABLET | Freq: Four times a day (QID) | ORAL | Status: DC
  Administered 2023-04-30 – 2023-05-03 (×9): 2 mg
  Filled 2023-04-30 (×10): qty 2

## 2023-04-30 MED ORDER — BENZTROPINE MESYLATE 0.5 MG PO TABS
1.0000 mg | ORAL_TABLET | Freq: Three times a day (TID) | ORAL | Status: DC
  Administered 2023-04-30 – 2023-05-01 (×5): 1 mg
  Filled 2023-04-30 (×6): qty 2

## 2023-04-30 MED ORDER — ONDANSETRON HCL 4 MG/2ML IJ SOLN
4.0000 mg | Freq: Once | INTRAMUSCULAR | Status: DC
  Filled 2023-04-30: qty 2

## 2023-04-30 NOTE — Progress Notes (Signed)
                                                                                                                                                                                                           Daily Progress Note   Patient Name: Jared Mayer       Date: 04/30/2023 DOB: 11/10/1970  Age: 52 y.o. MRN#: [id number] Attending Physician: Jared Duster, MD Primary Care Physician: Jared Mayer, [facility] Admit Date: 04/08/2023  Reason for Consultation/Follow-up: Establishing goals of care  HPI/Brief Hospital Review: 52 y.o. male  with past medical history of intellectual disability, schizoaffective disorder, drug-induced parkinsonism, HLD and hypothyroidism admitted from group home on 04/08/2023 with AMS, erratic behavior and recurrent falls.   Concern for neuroleptic malignant syndrome-has been started on bromocriptine.   Palliative medicine was consulted for assisting with goals of care conversations.  Subjective: Extensive chart review has been completed prior to meeting patient including labs, vital signs, imaging, progress notes, orders, and available advanced directive documents from current and previous encounters.    Visited with Jared Mayer at his bedside. He is awake, alert, attempting to communicate, speech difficult to understand, repeats same phrase. Able to follow simple commands. No family at bedside during initial visit.  Returned to bedside later in afternoon. [name] at bedside. Jared Mayer is hopeful for Jared Mayer to continue to improve. We speak to the significant improvements compared to our last visit during the previous weekend. Reviewed recommendations made by neurology team. Answered and addressed all questions and concerns. Will attempt to visit with Jared Mayer again tomorrow after her shift around 11am.  Thank you for allowing the Palliative Medicine Team to assist in the care of this patient.  Total time:  35 minutes  Time spent includes: Detailed review of  medical records (labs, imaging, vital signs), medically appropriate exam (mental status, respiratory, cardiac, skin), discussed with treatment team, counseling and educating patient, family and staff, documenting clinical information, medication management and coordination of care.  Jared Deed, DNP, AGNP-C Palliative Medicine   Please contact Palliative Medicine Team phone at [phone] for questions and concerns.
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
## 2023-04-30 NOTE — Progress Notes (Signed)
Progress Note   Patient: Jared Mayer NWG:[id number] DOB: 1970-08-17 DOA: 04/08/2023     22 DOS: the patient was seen and examined on 04/30/2023   Brief hospital course: Jared Mayer is a 52 y.o. male with medical history significant for intellectual disability, schizoaffective disorder, dyslipidemia, drug-induced parkinsonism, hypothyroidism, presented to ED for evaluation of altered mental status, disorientation, repetitive speech pattern, erratic behavior and recurrent falls.  Reportedly, he had been more aggressive at the group home than usual admitted to hospitalist service for further management evaluation of altered mental status.  Assessment and Plan: Acute toxic metabolic encephalopathy SIRS Neurolept malignant syndrome Fever improved. Mental status slow improvement. Neurology on board advised Clonazepam standing dose frequency decreased to q6hr. Continue Bromocriptine 5 tid. Got a dose of benztropine, with 1mg  tid. Continue to hold all antipsychotics Continue to monitor electrolytes and replete accordingly. Scheduled Tylenol, cooling blanket for high grade fever. Continue tube feeds, meds. Palliative team on board discussing with mother.  Hypokalemia- improved. Continue to monitor daily electrolytes.  Hyponatremia Continue tube feeds, free water. Monitor Na trend.  Hypophosphatemia: Phos repleted. Oral phos daily replacement.  Rhabdomyolysis Elevated LFT In the setting of use of antipsychotic medications, NMS.  Hold off antipsychotics, hepatotoxic medications. LFT, CK high improving. Continue oral hydration.  Schizoaffective disorder: Psychiatry chart follow up appreciated. Hold current antipsychotics.     Nursing supportive care. Fall, aspiration precautions. DVT prophylaxis - Lovenox   Code Status: Full Code Prognosis guarded.  Subjective: Patient is seen and examined today morning. He is able to open eyes, follows simple commands. He is tolerating NG  feeds.  Physical Exam: Vitals:   04/30/23 0401 04/30/23 0744 04/30/23 1223 04/30/23 1622  BP: (!) 92/56 (!) 92/54 (!) 91/50 (!) 93/55  Pulse: 79 81 76 77  Resp: 20 20  17   Temp: 98 F (36.7 C) 97.9 F (36.6 C) 98 F (36.7 C) 97.8 F (36.6 C)  TempSrc: Oral Oral Oral Oral  SpO2: 95% 100% 99% 100%  Weight:       General -Middle aged Caucasian male, opens eyes HEENT - PERRLA, EOMI, atraumatic head, non tender sinuses. Lung - Clear, bibasal rales, rhonchi, no wheezes. Heart - S1, S2 heard, no murmurs, rubs, no pedal edema. Abdomen - Soft, non tender, bowel sounds good Neuro - opens eyes spontaneously, unable to do full neuroexam.. Skin - Warm and dry.  Data Reviewed:      Latest Ref Rng & Units 04/30/2023    5:52 AM 04/29/2023    7:58 AM 04/27/2023    7:04 PM  CBC  WBC 4.0 - 10.5 K/uL 7.8  8.8  8.6   Hemoglobin 13.0 - 17.0 g/dL 57.8  46.9  62.9   Hematocrit 39.0 - 52.0 % 37.1  39.8  38.3   Platelets 150 - 400 K/uL 374  356  304       Latest Ref Rng & Units 04/30/2023    5:52 AM 04/29/2023    7:58 AM 04/27/2023    7:04 PM  BMP  Glucose 70 - 99 mg/dL 97  528  413   BUN 6 - 20 mg/dL 15  22  26    Creatinine 0.61 - 1.24 mg/dL 2.44  0.10  2.72   Sodium 135 - 145 mmol/L 133  132  138   Potassium 3.5 - 5.1 mmol/L 4.5  4.9  4.0   Chloride 98 - 111 mmol/L 99  98  104   CO2 22 - 32 mmol/L 24  25  24   Calcium 8.9 - 10.3 mg/dL 8.2  8.2  8.0    No results found.   Disposition: Status is: Inpatient Remains inpatient appropriate because: NMS treatment, close neuro follow up.  Planned Discharge Destination: Skilled nursing facility  Time spent 38 min.  Author: Marcelino Duster, MD 04/30/2023 4:43 PM Secure chat 7am to 7pm For on call review [email or web address].

## 2023-04-30 NOTE — TOC Progression Note (Signed)
Transition of Care [facility]) - Progression Note    Patient Details  Name: Jared Mayer MRN: [id number] Date of Birth: 10-21-1970  [facility] CM/SW Contact  Margarito Liner, LCSW Phone Number: 04/30/2023, 9:37 AM  Clinical Narrative:  CSW continues to follow progress. Patient has a bed at [facility] when stable.  Expected Discharge Plan: Assisted Living Barriers to Discharge: Continued Medical Work up  Expected Discharge Plan and Services       Living arrangements for the past 2 months: Assisted Living Facility                                       Social Determinants of Health (SDOH) Interventions SDOH Screenings   Food Insecurity: No Food Insecurity (04/12/2023)  Housing: Patient Unable To Answer (04/12/2023)  Transportation Needs: Patient Unable To Answer (04/12/2023)  Utilities: Patient Unable To Answer (04/12/2023)  Financial Resource Strain: Low Risk  (03/29/2018)  Physical Activity: Unknown (03/29/2018)  Social Connections: Unknown (03/29/2018)  Stress: No Stress Concern Present (03/29/2018)  Tobacco Use: Low Risk  (04/08/2023)    Readmission Risk Interventions     No data to display

## 2023-04-30 NOTE — Consult Note (Signed)
PHARMACY CONSULT NOTE - ELECTROLYTES  Pharmacy Consult for Electrolyte Monitoring and Replacement   Recent Labs: Weight: 83 kg (182 lb 15.7 oz) Estimated Creatinine Clearance: 113.4 mL/min (A) (by C-G formula based on SCr of 0.45 mg/dL (L)). Potassium (mmol/L)  Date Value  04/30/2023 4.5   Magnesium (mg/dL)  Date Value  54/27/0623 2.1   Calcium (mg/dL)  Date Value  76/28/3151 8.2 (L)   Albumin (g/dL)  Date Value  76/16/0737 2.3 (L)   Phosphorus (mg/dL)  Date Value  10/62/6948 3.7   Sodium (mmol/L)  Date Value  04/30/2023 133 (L)   Corrected Ca: 8.6 mg/dL  Assessment  Jared Mayer is a 52 y.o. male presenting with Neuroleptic Malignant Syndrome. PMH significant for schizoaffective disorder, HLD, hypothyroidism, and intellectual disability. Pharmacy has been consulted to monitor and replace electrolytes.  Diet: continuous tube feeds MIVF:  on free water 150 ml q4H.   Goal of Therapy: Electrolytes WNL  Plan:  No replacement needed at this time.  F/u with AM labs.   Thank you for allowing pharmacy to be a part of this patient's care.  Angelique Blonder, PharmD Clinical Pharmacist 04/30/2023 12:54 PM

## 2023-05-01 DIAGNOSIS — R1312 Dysphagia, oropharyngeal phase: Secondary | ICD-10-CM

## 2023-05-01 DIAGNOSIS — R296 Repeated falls: Secondary | ICD-10-CM | POA: Diagnosis not present

## 2023-05-01 DIAGNOSIS — G210 Malignant neuroleptic syndrome: Secondary | ICD-10-CM | POA: Diagnosis not present

## 2023-05-01 DIAGNOSIS — R6510 Systemic inflammatory response syndrome (SIRS) of non-infectious origin without acute organ dysfunction: Secondary | ICD-10-CM | POA: Diagnosis not present

## 2023-05-01 DIAGNOSIS — R4182 Altered mental status, unspecified: Secondary | ICD-10-CM | POA: Diagnosis not present

## 2023-05-01 DIAGNOSIS — Z515 Encounter for palliative care: Secondary | ICD-10-CM | POA: Diagnosis not present

## 2023-05-01 DIAGNOSIS — F250 Schizoaffective disorder, bipolar type: Secondary | ICD-10-CM | POA: Diagnosis not present

## 2023-05-01 DIAGNOSIS — M6282 Rhabdomyolysis: Secondary | ICD-10-CM | POA: Diagnosis not present

## 2023-05-01 LAB — BASIC METABOLIC PANEL
Anion gap: 7 (ref 5–15)
BUN: 19 mg/dL (ref 6–20)
CO2: 26 mmol/L (ref 22–32)
Calcium: 8.3 mg/dL — ABNORMAL LOW (ref 8.9–10.3)
Chloride: 101 mmol/L (ref 98–111)
Creatinine, Ser: 0.44 mg/dL — ABNORMAL LOW (ref 0.61–1.24)
GFR, Estimated: >60 mL/min (ref >60)
Glucose, Bld: 108 mg/dL — ABNORMAL HIGH (ref 70–99)
Potassium: 4.5 mmol/L (ref 3.5–5.1)
Sodium: 134 mmol/L — ABNORMAL LOW (ref 135–145)

## 2023-05-01 LAB — GLUCOSE, CAPILLARY
Glucose-Capillary: 85 mg/dL (ref 70–99)
Glucose-Capillary: 110 mg/dL — ABNORMAL HIGH (ref 70–99)
Glucose-Capillary: 94 mg/dL (ref 70–99)
Glucose-Capillary: 100 mg/dL — ABNORMAL HIGH (ref 70–99)
Glucose-Capillary: 87 mg/dL (ref 70–99)
Glucose-Capillary: 80 mg/dL (ref 70–99)
Glucose-Capillary: 115 mg/dL — ABNORMAL HIGH (ref 70–99)

## 2023-05-01 LAB — CBC
HCT: 34.6 % — ABNORMAL LOW (ref 39.0–52.0)
Hemoglobin: 11.7 g/dL — ABNORMAL LOW (ref 13.0–17.0)
MCH: 32.9 pg (ref 26.0–34.0)
MCHC: 33.8 g/dL (ref 30.0–36.0)
MCV: 97.2 fL (ref 80.0–100.0)
Platelets: 419 10*3/uL — ABNORMAL HIGH (ref 150–400)
RBC: 3.56 MIL/uL — ABNORMAL LOW (ref 4.22–5.81)
RDW: 14.4 % (ref 11.5–15.5)
WBC: 7.5 10*3/uL (ref 4.0–10.5)
nRBC: 0 % (ref 0.0–0.2)

## 2023-05-01 LAB — CK: Total CK: 251 U/L (ref 49–397)

## 2023-05-01 LAB — PHOSPHORUS: Phosphorus: 4.5 mg/dL (ref 2.5–4.6)

## 2023-05-01 LAB — MAGNESIUM: Magnesium: 2.2 mg/dL (ref 1.7–2.4)

## 2023-05-01 NOTE — Evaluation (Signed)
Clinical/Bedside Swallow Evaluation Patient Details  Name: Jared Mayer MRN: [id number] Date of Birth: June 16, 1970  Today's Date: 05/01/2023 Time: SLP Start Time (ACUTE ONLY): 1130 SLP Stop Time (ACUTE ONLY): 1215 SLP Time Calculation (min) (ACUTE ONLY): 45 min  Past Medical History:  Past Medical History:  Diagnosis Date   Dementia (HCC)    Dyslipidemia    Hypothyroid    Manic behavior (HCC)    Mentally disabled    Schizoaffective disorder, bipolar type (HCC)    Past Surgical History: History reviewed. No pertinent surgical history. HPI:  Pt is a 52 y.o. Caucasian male with medical history significant for mental disability, Lew Body Dementia, Schizoaffective disorder, bipolar type, agressive/manic behavior, dyslipidemia, drug-induced parkinsonism, Falls, and hypothyroidism, who presented to the emergency room from his Group Home with acute onset of altered mental status with recent recurrent falls and unsteady gait with worsening disorientation and repetitive speech pattern as well as erratic behavior.  He has been more aggressive than usual at his group home per his mother who is a Engineer, civil (consulting) and was accompanying him initially.  He has been slamming his fist against walls and tables.  He was seen in the ED for psychiatric evaluation recently and did not have any significant medication changes per his mother.  He denied any chest pain or palpitation.  No cough or wheezing or dyspnea.  No dysuria, oliguria or hematuria or flank pain.  He was noted to have a fever of 38 C and denied chills.  No headache or dizziness or blurred vision.  No paresthesias or new focal muscle weakness.   CXR: Lungs are clear. No pneumothorax or pleural effusion.  Recent Head CT 03/2023: Unchanged asymmetric left cerebral hemispheric atrophy w/ no acute findings then.  Pt appears to require FULL assistance at meals d/t decreased awareness/attenttion w/ tasks.  OF NOTE: per chart notes, "Per Caregiver, pt gets highly  agitated and yells aggressively when triggered. [name] explained that the pt. has been dx with Lewy body Dementia and feels that the pt. could benefit from a higher level of care.".     Pt had a Rapid Response on 04/21/23 and was made NPO.  Now w/ NGT for nutrition.    Assessment / Plan / Recommendation  Clinical Impression   Pt seen for BSE - a repeat assessment of swallowing s/p a decline in medical status/Rapid Response called on 04/21/23. Pt has been NPO since; NGT present.  Pt was initially assessed by ST services on 04/10/23 and continued on his baseline diet(mech soft, thins w/ strict Supervision and aspiration precautions) then.   Today, pt was awake, verbal but w/ no consistent responses to simple questions re: self. Echolalia at times. Pressured speech; mumbled speech and incoherent often. Does Not follow commands. Baseline Cognitive decline in setting of Dementia, medical dxs per chart. Impulsive w/ decreased awareness of self per chart.  Pt w/ head tilt to his R; supported w/ pillow. Noted audible Phlegmy secretions w/ wet vocal quality PRIOR TO any po's attempted.   Attempted oral care w/ pt requiring MAX cues in order to complete. Tonic biting on swab+. Noted thick secretions on tongue/roof of mouth which were removed as much as possible -- suspect impact from NGT TFs and mouth breathing. Noted audible wet vocal quality PRIOR TO any po's given.   Upon attempt of po's, inconsistent oral prep stage awareness was present. However, post trials being in his mouth/on tongue, pt allowed the trials of single ice chips and puree(applesauce) to lay  orally -- bolus material fell out anteriorly and/or was removed w/ washcloth by this SLP. Pt did NOT demonstrate the Cognitive awareness of the oral phase of swallowing -- did NOT attend to the boluses for management nor A-P transfer. No further trials given.   Pt appears to present w/ PROFOUND oropharyngeal phase Dysphagia and risk for  aspiration/aspiration pneumonia. Recommend STRICT NPO status. Recommend ST services f/u w/ dysphagia tx w/ po trial attempts in hopes to establish a least restrictive po diet consistency w/ pt -- discussed concerns of the NGT presence; Phlegmy secretions; need for longer term alternative means of feeding(PEG) w/ MDs.  Recommend frequent oral care for hygiene and stimulation of swallowing. NSG updated, agreed. Precautions posted.  SLP Visit Diagnosis: Dysphagia, oropharyngeal phase (R13.12) (Profound; NGT present. Lewy Body Dementia.)    Aspiration Risk  Severe aspiration risk;Risk for inadequate nutrition/hydration    Diet Recommendation   NPO (NGT+)  Medication Administration: Via alternative means    Other  Recommendations Recommended Consults:  (Palliative Care following; Dietician) Oral Care Recommendations: Oral care QID;Staff/trained caregiver to provide oral care    Recommendations for follow up therapy are one component of a multi-disciplinary discharge planning process, led by the attending physician.  Recommendations may be updated based on patient status, additional functional criteria and insurance authorization.  Follow up Recommendations Follow physician's recommendations for discharge plan and follow up therapies      Assistance Recommended at Discharge  FULL suspected  Functional Status Assessment Patient has had a recent decline in their functional status and/or demonstrates limited ability to make significant improvements in function in a reasonable and predictable amount of time  Frequency and Duration min 2x/week  2 weeks       Prognosis Prognosis for improved oropharyngeal function: Guarded Barriers to Reach Goals: Cognitive deficits;Language deficits;Time post onset;Severity of deficits Barriers/Prognosis Comment: Mental Disability, Dementia, Psychiatric illness/behaviors; Chronic dysphagia/impulsivity w/ oral intake. NGT+; Secretions+      Swallow Study    General Date of Onset: 04/08/23 HPI: Pt is a 52 y.o. Caucasian male with medical history significant for mental disability, Lew Body Dementia, Schizoaffective disorder, bipolar type, agressive/manic behavior, dyslipidemia, drug-induced parkinsonism, Falls, and hypothyroidism, who presented to the emergency room from his Group Home with acute onset of altered mental status with recent recurrent falls and unsteady gait with worsening disorientation and repetitive speech pattern as well as erratic behavior.  He has been more aggressive than usual at his group home per his mother who is a Engineer, civil (consulting) and was accompanying him initially.  He has been slamming his fist against walls and tables.  He was seen in the ED for psychiatric evaluation recently and did not have any significant medication changes per his mother.  He denied any chest pain or palpitation.  No cough or wheezing or dyspnea.  No dysuria, oliguria or hematuria or flank pain.  He was noted to have a fever of 38 C and denied chills.  No headache or dizziness or blurred vision.  No paresthesias or new focal muscle weakness.  CXR: Lungs are clear. No pneumothorax or pleural effusion.  Recent Head CT 03/2023: Unchanged asymmetric left cerebral hemispheric atrophy w/ no acute findings then.  Pt appears to require FULL assistance at meals d/t decreased awareness/attenttion w/ tasks.   OF NOTE: per chrat notes, "Per Caregiver, pt gets highly agitated and yells aggressively when triggered. [name] explained that the pt. has been dx with Lewy body Dementia and feels that the pt. could benefit from  a higher level of care.".   Pt had a Rapid Response on 04/21/23 and was made NPO.  Now w/ NGT for nutrition. Type of Study: Bedside Swallow Evaluation Previous Swallow Assessment: MBSS 12/2018: pt "is presenting with moderate oropharyngeal dysphagia characterized by impulsive eating/drinking behaviors, disorganized oral management, delayed pharyngeal swallow initiation,  trace pharyngeal residue, and laryngeal penetration of thin and thick liquids when taking large consecutive drinks.  There is no observed tracheal aspiration, although he is at risk of trace aspiration of laryngeal vestibule residue.  Given his impulsive eating/drinking behaviors due to his cognitive status, the patient is at risk for choking and aspiration while eating/drinking.".    BSE 04/10/23. Now NPO post decline on 04/21/23. Diet Prior to this Study: NPO Temperature Spikes Noted: No (wbc 7.5) Respiratory Status: Room air History of Recent Intubation: No Behavior/Cognition: Alert;Cooperative;Pleasant mood;Confused;Distractible;Requires cueing;Doesn't follow directions (pressured speech; mumbled speech and incoherent often) Oral Cavity Assessment: Excessive secretions (thick throughout oral cavity) Oral Care Completed by SLP: Yes (as he allowed) Oral Cavity - Dentition: Adequate natural dentition;Missing dentition (few) Vision:  (n/a) Self-Feeding Abilities: Total assist Patient Positioning: Upright in bed (needed full positioning) Baseline Vocal Quality: Low vocal intensity (mumbled) Volitional Cough: Cognitively unable to elicit Volitional Swallow: Unable to elicit    Oral/Motor/Sensory Function Overall Oral Motor/Sensory Function: Generalized oral weakness (open-mouth posture at rest; no unilateral weakness noted)   Ice Chips Ice chips: Impaired Presentation: Spoon (fed; 2 trials) Oral Phase Impairments: Poor awareness of bolus;Reduced lingual movement/coordination;Impaired mastication;Reduced labial seal Oral Phase Functional Implications: Prolonged oral transit Other Comments: fell out   Thin Liquid Thin Liquid: Not tested    Nectar Thick Nectar Thick Liquid: Not tested   Honey Thick Honey Thick Liquid: Not tested   Puree Puree: Impaired Presentation: Spoon (fed; 2 trials) Oral Phase Impairments: Reduced labial seal;Reduced lingual movement/coordination;Poor awareness of  bolus Oral Phase Functional Implications: Oral holding Other Comments: removed   Solid     Solid: Not tested        Jerilynn Som, MS, CCC-SLP Speech Language Pathologist Rehab Services; [facility] [place] [phone] (ascom) Shametra Cumberland 05/01/2023,2:39 PM

## 2023-05-01 NOTE — Progress Notes (Signed)
Progress Note   Patient: Jared Mayer BJY:[id number] DOB: [birth year] DOA: 04/08/2023     23 DOS: the patient was seen and examined on 05/01/2023   Brief hospital course: Teddrick Kerlin is a 52 y.o. male with medical history significant for intellectual disability, schizoaffective disorder, dyslipidemia, drug-induced parkinsonism, hypothyroidism, presented to ED for evaluation of altered mental status, disorientation, repetitive speech pattern, erratic behavior and recurrent falls.  Reportedly, he had been more aggressive at the group home than usual admitted to hospitalist service for further management evaluation of altered mental status.  Patient is noted to have fever, with no source of infection. He is started on bromocriptine for NMS. All psych meds held, NG tube placed for nutrition. Neurology advised Clonazepam standing dose. Bromocriptine. His mental status improved, fever better.   Assessment and Plan: Acute toxic metabolic encephalopathy SIRS Neurolept malignant syndrome Fever improved. Mental status slow improvement. Neurology follow up appreciated, Clonazepam 2mg  q6hr. Continue Bromocriptine 5 tid. Got a dose of benztropine, with 1mg  tid. Trend CK. Continue to hold all antipsychotics Continue to monitor electrolytes and replete accordingly. Scheduled Tylenol, cooling blanket for high grade fever. Continue tube feeds, meds. Palliative team on board discussing with mother.  Hypokalemia- improved. Continue to monitor daily electrolytes.  Hyponatremia Continue tube feeds, free water. Monitor Na trend.  Hypophosphatemia: Phos repleted. Oral phos daily replacement.  Rhabdomyolysis Elevated LFT In the setting of use of antipsychotic medications, NMS.  Hold off antipsychotics, hepatotoxic medications. LFT, CK high improving. Continue oral hydration.  Schizoaffective disorder: Psychiatry chart follow up appreciated. Hold current antipsychotics.  Oropharyngeal  dysphagia: SLP re-evaluated him, advised strict npo. NG tube feeds may be changed to bolus vs PEG tube given increased secretions and high risk for aspiration. Will discuss with mother regarding PEG tube.    Nursing supportive care. Fall, aspiration precautions. DVT prophylaxis - Lovenox   Code Status: Full Code Prognosis guarded.  Subjective: Patient is seen and examined today morning. He is more alert, follows simple commands. He is tolerating NG feeds. Remains afebrile. SLP advised NPO due to significant dysphagia.  Physical Exam: Vitals:   05/01/23 0327 05/01/23 0818 05/01/23 1728 05/01/23 2117  BP: (!) 113/49 (!) 85/55 110/65 (!) 85/46  Pulse: 80 83 78 76  Resp: (!) 21 16 18 20   Temp: 97.8 F (36.6 C) 98.5 F (36.9 C) 98.2 F (36.8 C) (!) 97.5 F (36.4 C)  TempSrc: Axillary Oral Axillary Oral  SpO2: 97% 99% 96% 99%  Weight:       General -Middle aged Caucasian male, no apparent distress HEENT - PERRLA, EOMI, atraumatic head, non tender sinuses. Lung - Clear, bibasal rales, rhonchi, no wheezes. Heart - S1, S2 heard, no murmurs, rubs, no pedal edema. Abdomen - Soft, non tender, bowel sounds good Neuro - opens eyes spontaneously, unable to do full neuroexam.. Skin - Warm and dry.  Data Reviewed:      Latest Ref Rng & Units 05/01/2023    4:51 AM 04/30/2023    5:52 AM 04/29/2023    7:58 AM  CBC  WBC 4.0 - 10.5 K/uL 7.5  7.8  8.8   Hemoglobin 13.0 - 17.0 g/dL 08.6  57.8  46.9   Hematocrit 39.0 - 52.0 % 34.6  37.1  39.8   Platelets 150 - 400 K/uL 419  374  356       Latest Ref Rng & Units 05/01/2023    4:51 AM 04/30/2023    5:52 AM 04/29/2023    7:58 AM  BMP  Glucose 70 - 99 mg/dL 147  97  829   BUN 6 - 20 mg/dL 19  15  22    Creatinine 0.61 - 1.24 mg/dL 5.62  1.30  8.65   Sodium 135 - 145 mmol/L 134  133  132   Potassium 3.5 - 5.1 mmol/L 4.5  4.5  4.9   Chloride 98 - 111 mmol/L 101  99  98   CO2 22 - 32 mmol/L 26  24  25    Calcium 8.9 - 10.3 mg/dL 8.3   8.2  8.2    DG Abd 1 View Result Date: 04/30/2023 CLINICAL DATA:  Feeding tube placement EXAM: ABDOMEN - 1 VIEW COMPARISON:  None Available. FINDINGS: Feeding tube tip is in the fundus of the stomach. Nonobstructive bowel gas pattern. Visualized lung bases clear. IMPRESSION: Feeding tube tip in the fundus of the stomach. Electronically Signed   By: Charlett Nose M.D.   On: 04/30/2023 23:52     Disposition: Status is: Inpatient Remains inpatient appropriate because: NMS treatment, SLP follow up.  Planned Discharge Destination: Skilled nursing facility  Time spent 37 min.  Author: Marcelino Duster, MD 05/01/2023 9:30 PM Secure chat 7am to 7pm For on call review [email or web address].

## 2023-05-01 NOTE — Consult Note (Signed)
PHARMACY CONSULT NOTE - ELECTROLYTES  Pharmacy Consult for Electrolyte Monitoring and Replacement   Recent Labs: Weight: 83 kg (182 lb 15.7 oz) Estimated Creatinine Clearance: 113.4 mL/min (A) (by C-G formula based on SCr of 0.44 mg/dL (L)). Potassium (mmol/L)  Date Value  05/01/2023 4.5   Magnesium (mg/dL)  Date Value  16/02/9603 2.2   Calcium (mg/dL)  Date Value  54/01/8118 8.3 (L)   Albumin (g/dL)  Date Value  14/78/2956 2.3 (L)   Phosphorus (mg/dL)  Date Value  21/30/8657 4.5   Sodium (mmol/L)  Date Value  05/01/2023 134 (L)   Assessment  Jared Mayer is a 52 y.o. male presenting with Neuroleptic Malignant Syndrome. PMH significant for schizoaffective disorder, HLD, hypothyroidism, and intellectual disability. Pharmacy has been consulted to monitor and replace electrolytes.  Diet: Continuous tube feeds MIVF:  Free water 150 mL q4h  Goal of Therapy: Electrolytes WNL  Plan:  --Electrolytes have been stable and not requiring replacement --Will discontinue electrolyte consult at this time. Pharmacy will follow peripherally  Thank you for allowing pharmacy to be a part of this patient's care.  Tressie Ellis 05/01/2023 8:36 AM

## 2023-05-01 NOTE — Progress Notes (Signed)
                                                                                                                                                                                                           Daily Progress Note   Patient Name: Jared Mayer       Date: 05/01/2023 DOB: 10-08-1970  Age: 52 y.o. MRN#: [id number] Attending Physician: Marcelino Duster, MD Primary Care Physician: Almetta Lovely, [facility] Admit Date: 04/08/2023  Reason for Consultation/Follow-up: Establishing goals of care  HPI/Brief Hospital Review: 52 y.o. male  with past medical history of intellectual disability, schizoaffective disorder, drug-induced parkinsonism, HLD and hypothyroidism admitted from group home on 04/08/2023 with AMS, erratic behavior and recurrent falls.   Concern for neuroleptic malignant syndrome-has been started on bromocriptine.   Palliative medicine was consulted for assisting with goals of care conversations.  Subjective: Extensive chart review has been completed prior to meeting patient including labs, vital signs, imaging, progress notes, orders, and available advanced directive documents from current and previous encounters.    Visited with Jared Mayer at his bedside. He is awake, alert, attempting to communicate, not able to consistently follow commands. Appears more comfortable today and less rigid on exam. No family at bedside during time of visit.  Speech therapy at bedside during visit attempting BSE. Noted wet vocal quality prior to PO attempts. Jared Mayer without consistent oral prep awareness and did not demonstrate awareness to safely swallow. Attempted ice chips fell out of mouth. ST to continue to follow for ongoing attempts. May need to consider PEG tube placement for long term nutritional needs during recovery period.  Called [name]-mother, left HIPAA compliant VM.  Care plan was discussed with ST and primary team.  Thank you for allowing the Palliative Medicine Team to  assist in the care of this patient.  Total time:  25 minutes  Time spent includes: Detailed review of medical records (labs, imaging, vital signs), medically appropriate exam (mental status, respiratory, cardiac, skin), discussed with treatment team, counseling and educating patient, family and staff, documenting clinical information, medication management and coordination of care.  Leeanne Deed, DNP, AGNP-C Palliative Medicine   Please contact Palliative Medicine Team phone at [phone] for questions and concerns.
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
## 2023-05-01 NOTE — Progress Notes (Signed)
MD notified  

## 2023-05-01 NOTE — Plan of Care (Signed)
Early on this shift patient has been saying he is thirsty and wanted apple sauce and drinks. However, he is currently NPO. I will relay patient's request to day shift RN.

## 2023-05-02 ENCOUNTER — Inpatient Hospital Stay: Payer: Medicare HMO

## 2023-05-02 DIAGNOSIS — Z515 Encounter for palliative care: Secondary | ICD-10-CM | POA: Diagnosis not present

## 2023-05-02 DIAGNOSIS — R296 Repeated falls: Secondary | ICD-10-CM | POA: Diagnosis not present

## 2023-05-02 DIAGNOSIS — R4182 Altered mental status, unspecified: Secondary | ICD-10-CM | POA: Diagnosis not present

## 2023-05-02 DIAGNOSIS — F250 Schizoaffective disorder, bipolar type: Secondary | ICD-10-CM | POA: Diagnosis not present

## 2023-05-02 LAB — GLUCOSE, CAPILLARY
Glucose-Capillary: 100 mg/dL — ABNORMAL HIGH (ref 70–99)
Glucose-Capillary: 103 mg/dL — ABNORMAL HIGH (ref 70–99)
Glucose-Capillary: 104 mg/dL — ABNORMAL HIGH (ref 70–99)
Glucose-Capillary: 81 mg/dL (ref 70–99)
Glucose-Capillary: 89 mg/dL (ref 70–99)
Glucose-Capillary: 89 mg/dL (ref 70–99)
Glucose-Capillary: 92 mg/dL (ref 70–99)

## 2023-05-02 LAB — CULTURE, BLOOD (ROUTINE X 2)
Culture: NO GROWTH
Culture: NO GROWTH
Special Requests: ADEQUATE

## 2023-05-02 LAB — CBC
HCT: 35.8 % — ABNORMAL LOW (ref 39.0–52.0)
Hemoglobin: 12 g/dL — ABNORMAL LOW (ref 13.0–17.0)
MCH: 32.4 pg (ref 26.0–34.0)
MCHC: 33.5 g/dL (ref 30.0–36.0)
MCV: 96.8 fL (ref 80.0–100.0)
Platelets: 474 10*3/uL — ABNORMAL HIGH (ref 150–400)
RBC: 3.7 MIL/uL — ABNORMAL LOW (ref 4.22–5.81)
RDW: 14.4 % (ref 11.5–15.5)
WBC: 11.9 10*3/uL — ABNORMAL HIGH (ref 4.0–10.5)
nRBC: 0 % (ref 0.0–0.2)

## 2023-05-02 MED ORDER — MIDODRINE HCL 5 MG PO TABS
5.0000 mg | ORAL_TABLET | Freq: Two times a day (BID) | ORAL | Status: DC
  Administered 2023-05-02 – 2023-05-06 (×6): 5 mg via NASOGASTRIC
  Filled 2023-05-02 (×7): qty 1

## 2023-05-02 MED ORDER — GERHARDT'S BUTT CREAM
TOPICAL_CREAM | Freq: Two times a day (BID) | CUTANEOUS | Status: DC
  Administered 2023-05-04 – 2023-05-10 (×2): 1 via TOPICAL
  Filled 2023-05-02 (×2): qty 60

## 2023-05-02 MED ORDER — BENZTROPINE MESYLATE 0.5 MG PO TABS
2.0000 mg | ORAL_TABLET | Freq: Three times a day (TID) | ORAL | Status: DC
  Administered 2023-05-02 – 2023-05-04 (×8): 2 mg
  Administered 2023-05-05: 1.5 mg
  Filled 2023-05-02 (×9): qty 4

## 2023-05-02 NOTE — Progress Notes (Signed)
Progress Note   Patient: Jared Mayer NIO:[id number] DOB: December 11, 1970 DOA: 04/08/2023     24 DOS: the patient was seen and examined on 05/02/2023   Brief hospital course: Jared Mayer is a 52 y.o. male with medical history significant for intellectual disability, schizoaffective disorder, dyslipidemia, drug-induced parkinsonism, hypothyroidism, presented to ED for evaluation of altered mental status, disorientation, repetitive speech pattern, erratic behavior and recurrent falls.  Reportedly, he had been more aggressive at the group home than usual admitted to hospitalist service for further management evaluation of altered mental status.  Patient is noted to have fever, with no source of infection. He is started on bromocriptine for NMS. All psych meds held, NG tube placed for nutrition. Neurology advised Clonazepam standing dose. Bromocriptine. His mental status improved, fever better. High risk for aspiration per SLP, need PEG tube. Palliative to discuss with mother today.  Assessment and Plan: Acute toxic metabolic encephalopathy SIRS Neurolept malignant syndrome Fever improved. Mental status slow improvement. Neurology follow up appreciated, Clonazepam 2mg  q6hr. Continue Bromocriptine 5 tid. Got a dose of benztropine, with 1mg  tid. Trend CK. Continue to hold all antipsychotics Continue to monitor electrolytes and replete accordingly. Stopped scheduled tylenol. Cooling blanket for high grade fever. Continue tube feeds, meds. Palliative team to discuss with mother.  Oropharyngeal dysphagia: High risk for aspiration SLP re-evaluated him, advised strict npo. WBC 11.9, Chest xray with right sided opacity, possible aspiration pneumonitis. Need to start antibiotics if he spikes fever. NG tube feeds may be changed to bolus vs PEG tube given increased secretions and high risk for aspiration.  Hypokalemia- improved. Continue to monitor daily electrolytes.  Hyponatremia Continue tube  feeds, free water. Monitor Na trend.  Hypophosphatemia: Phos repleted. Oral phos daily replacement.  Rhabdomyolysis Elevated LFT In the setting of use of antipsychotic medications, NMS.  Hold off antipsychotics, hepatotoxic medications. LFT, CK high improving. Continue oral hydration.  Schizoaffective disorder: Psychiatry chart follow up appreciated. Hold current antipsychotics.      Nursing supportive care. Fall, aspiration precautions. DVT prophylaxis - Lovenox   Code Status: Full Code Prognosis guarded.  Subjective: Patient is seen and examined today morning. He is more alert, tracking with eyes. He is tolerating NG feeds. Afebrile. Family discussion today with palliative.  Physical Exam: Vitals:   05/01/23 2140 05/02/23 0500 05/02/23 0525 05/02/23 0830  BP: (!) 82/57  (!) 99/57 114/68  Pulse:   96 91  Resp:   20 18  Temp:   98.2 F (36.8 C) 98.2 F (36.8 C)  TempSrc:   Oral   SpO2:   97%   Weight:  72.3 kg     General -Middle aged Caucasian male, calm, no apparent distress HEENT - PERRLA, EOMI, atraumatic head, non tender sinuses. Lung - Clear, bibasal rales, rhonchi. Heart - S1, S2 heard, no murmurs, rubs, no pedal edema. Abdomen - Soft, non tender, NG tube intact, bowel sounds good Neuro - opens eyes, calm, unable to do full neuroexam.. Skin - Warm and dry.  Data Reviewed:      Latest Ref Rng & Units 05/02/2023    3:57 AM 05/01/2023    4:51 AM 04/30/2023    5:52 AM  CBC  WBC 4.0 - 10.5 K/uL 11.9  7.5  7.8   Hemoglobin 13.0 - 17.0 g/dL 81.8  29.9  37.1   Hematocrit 39.0 - 52.0 % 35.8  34.6  37.1   Platelets 150 - 400 K/uL 474  419  374       Latest Ref  Rng & Units 05/01/2023    4:51 AM 04/30/2023    5:52 AM 04/29/2023    7:58 AM  BMP  Glucose 70 - 99 mg/dL 454  97  098   BUN 6 - 20 mg/dL 19  15  22    Creatinine 0.61 - 1.24 mg/dL 1.19  1.47  8.29   Sodium 135 - 145 mmol/L 134  133  132   Potassium 3.5 - 5.1 mmol/L 4.5  4.5  4.9   Chloride  98 - 111 mmol/L 101  99  98   CO2 22 - 32 mmol/L 26  24  25    Calcium 8.9 - 10.3 mg/dL 8.3  8.2  8.2    DG Chest Port 1 View Result Date: 05/02/2023 CLINICAL DATA:  Rhonchi. EXAM: PORTABLE CHEST 1 VIEW COMPARISON:  04/21/2023 FINDINGS: Patchy airspace disease is seen at the right base. Left lung clear. The cardiopericardial silhouette is within normal limits for size. The NG tube passes into the stomach although the distal tip position is not included on the film. No acute bony abnormality. Telemetry leads overlie the chest. IMPRESSION: Patchy airspace disease at the right base, compatible with atelectasis or pneumonia. Electronically Signed   By: Kennith Center M.D.   On: 05/02/2023 11:36   DG Abd 1 View Result Date: 04/30/2023 CLINICAL DATA:  Feeding tube placement EXAM: ABDOMEN - 1 VIEW COMPARISON:  None Available. FINDINGS: Feeding tube tip is in the fundus of the stomach. Nonobstructive bowel gas pattern. Visualized lung bases clear. IMPRESSION: Feeding tube tip in the fundus of the stomach. Electronically Signed   By: Charlett Nose M.D.   On: 04/30/2023 23:52   Disposition: Status is: Inpatient Remains inpatient appropriate because: NMS treatment, SLP follow up. May need PEG  Planned Discharge Destination: Skilled nursing facility  Time spent 38 min.  Author: Marcelino Duster, MD 05/02/2023 3:50 PM Secure chat 7am to 7pm For on call review [email or web address].

## 2023-05-02 NOTE — Progress Notes (Signed)
SLP Cancellation Note  Patient Details Name: Willam Encinias MRN: [id number] DOB: [birth year]   Cancelled treatment:       Reason Eval/Treat Not Completed: Fatigue/lethargy limiting ability to participate (Pt unable to rouse for safe PO trials.)  Clyde Canterbury, M.S., CCC-SLP Speech-Language Pathologist [facility] [phone] Arnette Felts)  Woodroe Chen 05/02/2023, 12:49 PM

## 2023-05-02 NOTE — Plan of Care (Signed)
  Problem: Nutrition: Goal: Adequate nutrition will be maintained Outcome: Progressing   Problem: Pain Management: Goal: General experience of comfort will improve Outcome: Progressing

## 2023-05-02 NOTE — Progress Notes (Signed)
Daily Progress Note   Patient Name: Jared Mayer       Date: 05/02/2023 DOB: 09-Apr-1971  Age: 52 y.o. MRN#: [id number] Attending Physician: Marcelino Duster, MD Primary Care Physician: Almetta Lovely, [facility] Admit Date: 04/08/2023  Reason for Consultation/Follow-up: Establishing goals of care  HPI/Brief Hospital Review: 52 y.o. male  with past medical history of intellectual disability, schizoaffective disorder, drug-induced parkinsonism, HLD and hypothyroidism admitted from group home on 04/08/2023 with AMS, erratic behavior and recurrent falls.   Concern for neuroleptic malignant syndrome-has been started on bromocriptine.  12/22 NGT remains-discussions surrounding possible PEG placement due to high risk for aspiration and high risk for limited PO intake Per neuro recommendations, APAP d/c'd, considering sources of infection due to continued leukocytosis   Palliative medicine was consulted for assisting with goals of care conversations.    Subjective: Extensive chart review has been completed prior to meeting patient including labs, vital signs, imaging, progress notes, orders, and available advanced directive documents from current and previous encounters.    Visited with Jared Mayer at his bedside, he is awake, alert, attempting to stretch upper extremities, able to verbalize but speech is difficult to understand. No family at bedside during time of visit.  Called and spoke with [name], plan set to meet at bedside around 3pm after her shift-care team invited to join conversation.  Met with [name] in collaboration with Dr. Iver Nestle. Dr. Iver Nestle provided medical updates, medication adjustments continue due to clinical presentation. Dystonic posturing remains impeding  safe PO intake. Overall hope to continue to improve, allowing time for outcomes.  We discussed nutritional status. NGT remains, has been in place for about a week, concern for soft tissue breakdown around day 10-12. Concern also from ST that tube in place may be causing more issues with swallow evaluation. We discussed the role of PEG tube placement being a bridge for nutrition with ongoing hopes of continued improvement with overall goal for him to eventually be able to meet his nutritional demands safely.  Discussed with [name] these decisions do not need to made in this moment. Encouraged ongoing conversations and ensured her ST will continue to assess for swallow safety.    Thank you for allowing the Palliative Medicine Team to assist in the care of this patient.  Total time:  35 minutes  Time spent  includes: Detailed review of medical records (labs, imaging, vital signs), medically appropriate exam (mental status, respiratory, cardiac, skin), discussed with treatment team, counseling and educating patient, family and staff, documenting clinical information, medication management and coordination of care.  Leeanne Deed, DNP, AGNP-C Palliative Medicine   Please contact Palliative Medicine Team phone at [phone] for questions and concerns.
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
## 2023-05-03 ENCOUNTER — Inpatient Hospital Stay: Payer: Medicare HMO

## 2023-05-03 ENCOUNTER — Encounter: Payer: Self-pay | Admitting: Family Medicine

## 2023-05-03 DIAGNOSIS — Z7189 Other specified counseling: Secondary | ICD-10-CM | POA: Diagnosis not present

## 2023-05-03 DIAGNOSIS — Z515 Encounter for palliative care: Secondary | ICD-10-CM | POA: Diagnosis not present

## 2023-05-03 DIAGNOSIS — R4182 Altered mental status, unspecified: Secondary | ICD-10-CM | POA: Diagnosis not present

## 2023-05-03 DIAGNOSIS — A419 Sepsis, unspecified organism: Secondary | ICD-10-CM | POA: Diagnosis not present

## 2023-05-03 LAB — CBC
HCT: 33.4 % — ABNORMAL LOW (ref 39.0–52.0)
Hemoglobin: 11.6 g/dL — ABNORMAL LOW (ref 13.0–17.0)
MCH: 32.4 pg (ref 26.0–34.0)
MCHC: 34.7 g/dL (ref 30.0–36.0)
MCV: 93.3 fL (ref 80.0–100.0)
Platelets: 530 10*3/uL — ABNORMAL HIGH (ref 150–400)
RBC: 3.58 MIL/uL — ABNORMAL LOW (ref 4.22–5.81)
RDW: 14.4 % (ref 11.5–15.5)
WBC: 11.2 10*3/uL — ABNORMAL HIGH (ref 4.0–10.5)
nRBC: 0 % (ref 0.0–0.2)

## 2023-05-03 LAB — GLUCOSE, CAPILLARY
Glucose-Capillary: 86 mg/dL (ref 70–99)
Glucose-Capillary: 92 mg/dL (ref 70–99)
Glucose-Capillary: 80 mg/dL (ref 70–99)
Glucose-Capillary: 69 mg/dL — ABNORMAL LOW (ref 70–99)
Glucose-Capillary: 91 mg/dL (ref 70–99)
Glucose-Capillary: 92 mg/dL (ref 70–99)

## 2023-05-03 LAB — VITAMIN B1: Vitamin B1 (Thiamine): 230.8 nmol/L — ABNORMAL HIGH (ref 66.5–200.0)

## 2023-05-03 LAB — CK: Total CK: 242 U/L (ref 49–397)

## 2023-05-03 MED ORDER — LORAZEPAM 2 MG/ML IJ SOLN
2.0000 mg | Freq: Four times a day (QID) | INTRAMUSCULAR | Status: DC
  Administered 2023-05-03 – 2023-05-04 (×5): 2 mg via INTRAVENOUS
  Filled 2023-05-03 (×5): qty 1

## 2023-05-03 MED ORDER — SODIUM CHLORIDE 0.9 % IV SOLN: INTRAVENOUS | Status: DC

## 2023-05-03 MED ORDER — METOCLOPRAMIDE HCL 5 MG/ML IJ SOLN
10.0000 mg | Freq: Three times a day (TID) | INTRAMUSCULAR | Status: DC
Start: 2023-05-03 — End: 2023-05-06
  Administered 2023-05-03 – 2023-05-06 (×8): 10 mg via INTRAVENOUS
  Filled 2023-05-03 (×8): qty 2

## 2023-05-03 MED ORDER — FREE WATER
30.0000 mL | Status: DC
Start: 2023-05-03 — End: 2023-05-11
  Administered 2023-05-03 – 2023-05-11 (×33): 30 mL

## 2023-05-03 MED ORDER — DEXTROSE 5 % IV SOLN: INTRAVENOUS | Status: AC

## 2023-05-03 NOTE — Plan of Care (Signed)
Patient alert and oriented X 1, impusive and risk for aspiration due to NGT in right nare and constant maneuvering around in bed. Telesitter ordered and active. Patient woke on and off throughout night, voice sounds gurgly. Frequent oralcare and suctioning throughout niggt as often as hourly. Problem: Fluid Volume: Goal: Hemodynamic stability will improve Outcome: Progressing   Problem: Clinical Measurements: Goal: Diagnostic test results will improve Outcome: Progressing Goal: Signs and symptoms of infection will decrease Outcome: Progressing   Problem: Respiratory: Goal: Ability to maintain adequate ventilation will improve Outcome: Progressing   Problem: Education: Goal: Knowledge of General Education information will improve Description: Including pain rating scale, medication(s)/side effects and non-pharmacologic comfort measures Outcome: Progressing   Problem: Health Behavior/Discharge Planning: Goal: Ability to manage health-related needs will improve Outcome: Progressing   Problem: Clinical Measurements: Goal: Ability to maintain clinical measurements within normal limits will improve Outcome: Progressing Goal: Will remain free from infection Outcome: Progressing Goal: Diagnostic test results will improve Outcome: Progressing Goal: Respiratory complications will improve Outcome: Progressing Goal: Cardiovascular complication will be avoided Outcome: Progressing   Problem: Activity: Goal: Risk for activity intolerance will decrease Outcome: Progressing   Problem: Nutrition: Goal: Adequate nutrition will be maintained Outcome: Progressing   Problem: Coping: Goal: Level of anxiety will decrease Outcome: Progressing   Problem: Elimination: Goal: Will not experience complications related to bowel motility Outcome: Progressing Goal: Will not experience complications related to urinary retention Outcome: Progressing   Problem: Pain Management: Goal: General  experience of comfort will improve Outcome: Progressing   Problem: Safety: Goal: Ability to remain free from injury will improve Outcome: Progressing   Problem: Skin Integrity: Goal: Risk for impaired skin integrity will decrease Outcome: Progressing

## 2023-05-03 NOTE — Progress Notes (Signed)
Daily Progress Note   Patient Name: Jared Mayer       Date: 05/03/2023 DOB: 06/13/1970  Age: 52 y.o. MRN#: [id number] Attending Physician: [name], MD Primary Care Physician: [facility] Admit Date: 04/08/2023 Length of Stay: 25 days  Reason for Consultation/Follow-up: Establishing goals of care  HPI/Patient Profile:  52 y.o. male  with past medical history of intellectual disability, schizoaffective disorder, drug-induced parkinsonism, HLD and hypothyroidism admitted from group home on 04/08/2023 with AMS, erratic behavior and recurrent falls.   Concern for neuroleptic malignant syndrome-has been started on bromocriptine.   12/22 NGT remains-discussions surrounding possible PEG placement due to high risk for aspiration and high risk for limited PO intake Per neuro recommendations, APAP d/c'd, considering sources of infection due to continued leukocytosis   Palliative medicine was consulted for assisting with goals of care conversations.  Subjective:   Subjective: Chart Reviewed. Updates received. Patient Assessed. Created space and opportunity for patient  and family to explore thoughts and feelings regarding current medical situation.  Today's Discussion: Today saw the patient at bedside, although he is very difficult to understand and I am unable to have meaningful conversation with him.  However when I asked his name, he seemed to say "Dannielle Huh" which would be orientation x 1.  Unable to determine if he is oriented to place and time.  I did note some tube feed formula on his mouth and chin which I took care to clean away.  This is evidence of regurgitation.  I spoke extensively with the patient's nurse and attending provider for today Dr. Fran Lowes.  Noted slow improvement for likely neuroleptic malignant syndrome.  Conversations been had over the past week about possible PEG tube, currently has an NG tube in place.  We were giving the patient's mother time to think, although  there is been a significant change today that makes it a more pressing situation.  The patient has begun regurgitating tube feeds so tube feeding has been held and he is unable to receive medications through his NG tube.  Flatplate abdomen pending for evaluation of possible migration of NG tube.  Per Dr. Fran Lowes, we need a PEG tube decision soon.  This would likely be a PEG with a J-tube extension to prevent regurgitation.  The patient's mother is a Engineer, civil (consulting) in the emergency department at this facility.  Knowing that she was at work today I went downstairs to the emergency department and with her permission, we stepped into an empty room to discuss.  I explained the current situation and the decision for PEG tube is more urgent.  Given this acute change she has given the authorization for a PEG tube.  I told her that they would likely be reaching out for consent.  She plans to see her son this evening after her shift.  We had further discussions about his quality life.  She states that he is "mentally slow" and it is the mind of a 56-year-old.  She is hopeful that he can return to his previous quality of life where he enjoyed sitting and listening to music.  She feels if his quality of life is poor and does not return to close to his baseline then we would need further discussions.  However, she has seen some mild improvement and we discussed that neuroleptic malignant syndrome often resolves within a couple weeks.  She is hopeful for improvement.  I told her that I am off tomorrow and would follow-up on Wednesday for further discussions depending  on how he does in the next 1 to 2 days.  I ensured she had our contact information for any questions or concerns before then.  I provided emotional and general support through therapeutic listening, empathy, sharing of stories, therapeutic touch, and other techniques. I answered all questions and addressed all concerns to the best of my ability.  After our conversation  reach back out to Dr. Fran Lowes and the nurse to tell her that mom has agreed to proceeding with PEG/J-tube.  Review of Systems  Unable to perform ROS: Mental status change    Objective:   Vital Signs:  BP 117/79 (BP Location: Left Arm)   Pulse 96   Temp 97.7 F (36.5 C) (Oral)   Resp 20   Wt 75.4 kg   SpO2 100%   BMI 25.27 kg/m   Physical Exam Vitals and nursing note reviewed.  Constitutional:      General: He is not in acute distress.    Appearance: He is ill-appearing.     Comments: Difficult to understand, may have said his name when asked.  HENT:     Head: Normocephalic and atraumatic.     Mouth/Throat:     Comments: Noted tube feeds on the patient's lips and chin which are wiped away Cardiovascular:     Rate and Rhythm: Normal rate.  Pulmonary:     Effort: Pulmonary effort is normal. No respiratory distress.  Abdominal:     General: Abdomen is flat.     Palpations: Abdomen is soft.  Skin:    General: Skin is warm and dry.  Neurological:     Mental Status: He is alert. He is disoriented.     Palliative Assessment/Data: 10% (NG tube feed)    Existing Vynca/ACP Documentation: None  Assessment & Plan:   Impression: Present on Admission:  Altered mental status  Schizoaffective disorder, bipolar type (HCC)  52 year old male with acute presentation and chronic comorbidities as described above.  The patient is admitted for treatment of likely neuroleptic malignant syndrome.  This is precluding him from having oral intake.  He has an NG tube and there have been ongoing discussions about possible PEG tube placement.  However, the situation is now a little bit more urgent as it appears his NG tube is not working and he is regurgitating tube feeds.  This is required the team to turn off his feedings and hold oral medications.  I spoke with the patient's mother who is agreed to PEG/J-tube placement, medical team has been told of this decision.  Mother is clear that if he  cannot return to a quality of life similar to his baseline then we would need to have further discussions on goals of care.  Overall long-term prognosis poor.  SUMMARY OF RECOMMENDATIONS   Remain full code Full scope of care Agreeable to PEG/J-tube Ongoing GOC conversations as patient's clinical status evolves Palliative medicine will follow-up on Wednesday or Thursday  Symptom Management:  Per primary team PMT is available to assist as needed  Code Status: Full code  Prognosis: Unable to determine  Discharge Planning: To Be Determined  Discussed with: Patient's family, medical team, nursing team  Thank you for allowing [place] to participate in the care of Brynden Tidd PMT will continue to support holistically.  Time Total: 60 min  Detailed review of medical records (labs, imaging, vital signs), medically appropriate exam, discussed with treatment team, counseling and education to patient, family, & staff, documenting clinical information, medication management, coordination of care  Wynne Dust, NP Palliative Medicine Team  Team Phone # [phone] (Nights/Weekends)  01/07/2021, 8:17 AM

## 2023-05-03 NOTE — Progress Notes (Signed)
Speech Language Pathology Treatment: Dysphagia (NGT present)  Patient Details Name: Jared Mayer MRN: [id number] DOB: 1971-04-04 Today's Date: 05/03/2023 Time: 1105-1150 SLP Time Calculation (min) (ACUTE ONLY): 45 min  Assessment / Plan / Recommendation Clinical Impression  Pt seen for ongoing assessment of swallowing s/p a decline in medical status/Rapid Response called on 04/21/23. Pt has been NPO since; NGT present. NSG reported "TFs coming out of pt's nose this morning". Noted pharyngolaryngeal secretions; wet vocal quality intermittently. Pt did NOT attempt to cough to clear.  Pt exhibits constant, involuntary moving about in bed.    Pt was initially assessed by ST services on 04/10/23 and continued on his baseline diet(mech soft, thins w/ strict Supervision and aspiration precautions) then.    Today, pt was awake, vocalizations++. No response to direct, simple questions re: self. Echolalia at times. Pressured speech; mumbled speech and incoherent often. Does Not follow commands. Baseline Cognitive decline in setting of Dementia, medical dxs per chart. Impulsive w/ decreased awareness of self per chart.  Pt w/ head tilt to his R; supported w/ towel roll. Noted audible Phlegmy secretions w/ wet vocal quality PRIOR TO any po's attempted.    Attempted oral care w/ pt requiring MOD-MAX cues in order to complete. Tonic biting on swab+. Noted thick, dried secretions on lips, orally which were removed as much as possible -- suspect impact from NGT TFs and mouth breathing.     Upon attempt of po's(Nectar by tsp, applesauce), inconsistent oral prep stage awareness was present -- he opened mouth to fully accept 1/4 trials only. Post trials in his mouth/on tongue, pt allowed the trials to lay orally w/ poorly organized oral movements for bolus management. Bolus material then fell out anteriorly and/or was removed w/ washcloth by this SLP. Pt ONLY demonstrate 1 pharyngeal swallow to presence of the po's  fed to him. Significantly decreased Cognitive awareness of the oral phase of swallowing present. No further trials given.   Pt does not appear to exhibit improvement from 2 days ago(repeated BSE) in his overall medical/Cognitive presentation as well as in his swallowing presentation.   Pt appears to present w/ PROFOUND oropharyngeal phase Dysphagia and risk for aspiration/aspiration pneumonia. Recommend STRICT NPO status. Pt appears to present w/ wet vocal quality and is at risk for aspiration of NGT TFs. Recommend f/u w/ Palliative Care and Team re: concerns of the NGT presence and pt's constant movements, Phlegmy secretions, and potential need for longer term alternative means of feeding(PEG) vs not(Per GOC/POC).  Recommend frequent oral care for hygiene and stimulation of swallowing. NSG updated, agreed. Precautions posted.   ST services will f/u post POC discussion w/ Family today.      HPI HPI: Pt is a 52 y.o. Caucasian male with medical history significant for mental disability, Lew Body Dementia, Schizoaffective disorder, bipolar type, agressive/manic behavior, dyslipidemia, drug-induced parkinsonism, Falls, and hypothyroidism, who presented to the emergency room from his Group Home with acute onset of altered mental status with recent recurrent falls and unsteady gait with worsening disorientation and repetitive speech pattern as well as erratic behavior.  He has been more aggressive than usual at his group home per his mother who is a Engineer, civil (consulting) and was accompanying him initially.  He has been slamming his fist against walls and tables.  He was seen in the ED for psychiatric evaluation recently and did not have any significant medication changes per his mother.  He denied any chest pain or palpitation.  No cough or wheezing or dyspnea.  No dysuria, oliguria or hematuria or flank pain.  He was noted to have a fever of 38 C and denied chills.  No headache or dizziness or blurred vision.  No  paresthesias or new focal muscle weakness.  CXR: Lungs are clear. No pneumothorax or pleural effusion.  Recent Head CT 03/2023: Unchanged asymmetric left cerebral hemispheric atrophy w/ no acute findings then.  Pt appears to require FULL assistance at meals d/t decreased awareness/attenttion w/ tasks.   OF NOTE: per chrat notes, "Per Caregiver, pt gets highly agitated and yells aggressively when triggered. [name] explained that the pt. has been dx with Lewy body Dementia and feels that the pt. could benefit from a higher level of care.".   Pt had a Rapid Response on 04/21/23 and was made NPO.  Now w/ NGT for nutrition.      SLP Plan  Consult other service (comment) (Palliative Care meeting for GOC; PEG option)      Recommendations for follow up therapy are one component of a multi-disciplinary discharge planning process, led by the attending physician.  Recommendations may be updated based on patient status, additional functional criteria and insurance authorization.    Recommendations  Diet recommendations: NPO Medication Administration: Via alternative means (NGT)                 (Palliative Care) Oral care QID;Staff/trained caregiver to provide oral care (frequent)   Frequent or constant Supervision/Assistance Dysphagia, oropharyngeal phase (R13.12) (Profound; NGT present. Lewy Body Dementia.)     Consult other service (comment) (Palliative Care meeting for GOC; PEG option)       Jerilynn Som, MS, CCC-SLP Speech Language Pathologist Rehab Services; [facility] [phone] (ascom) Rolene Andrades  05/03/2023, 2:09 PM

## 2023-05-03 NOTE — Progress Notes (Signed)
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   Topical BID   glycopyrrolate  1 mg Per Tube BID   levothyroxine  125 mcg Per NG tube Q0600   metoCLOPramide (REGLAN) injection  10 mg Intravenous Once   midodrine  5  mg Per NG tube BID WC   multivitamin with minerals  1 tablet Per Tube Daily   pantoprazole (PROTONIX) IV  40 mg Intravenous Q24H   polyethylene glycol  34 g Per Tube Daily   Vitamin D (Ergocalciferol)  50,000 Units Oral Q7 days    Assessment/Plan: 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 Differential includes malignant catatonia, less likely serotonin syndrome without hyperreflexia on exam. Based on workup as above, low concern for infection. EEG negative for seizure   Patient and lab work improved.  Patient on Clonazepam and Bromocriptine.  Benztropine increased on yesterday.   CXR suggestive of PNA vs atelectasis which may explain increased wbc count.    Recommendations: -Continue Clonazepam 2 mg every 6 hours  -Continue bromocriptine (started on 12/12 at 2.5 mg 3 times daily increased to 5 mg 3 times daily on 12/13) -Continue Benztropine 2 mg TID; hold for RASS < -1  -CK tomorrow AM to confirm stability as meds as adjusted  -Continue to hold other psychoactive medications at this time, would not restart any of these without reinvolvement of psychiatry.  -Strict avoidance of all neuroleptics and anti-dopaminergic agents at this time -Appreciate management of comorbidities per primary team -Neurology will continue to follow   LOS: 25 days   Thana Farr, MD Neurology  05/03/2023  10:39 AM

## 2023-05-03 NOTE — Progress Notes (Signed)
Cogentin, Parlodel and Midodrine crushed and given via NG tube with 30 cc flush. Will monitor to see if they stay down. So far, the free water via NG tube is staying down his throat, no aspiration.

## 2023-05-03 NOTE — Progress Notes (Signed)
Nutrition Follow-up  DOCUMENTATION CODES:   Not applicable  INTERVENTION:   Once tube feeds resumed, recommend:  Osmolite 1.5@50ml /hr- Initiate at 71ml/hr and increase by 45ml/hr q8 hours until goal rate is reached.   ProSource TF 20- Give 60ml BID via tube, each supplement provides 80kcal and 20g of protein.   Free water flushes 30ml q4 hours to maintain tube patency   Regimen provides 1960kcal/day, 115g/day protein and 1020ml/day of free water.   Pt at high refeed risk; recommend monitor potassium, magnesium and phosphorus labs daily until stable  Daily weights   NUTRITION DIAGNOSIS:   Inadequate oral intake related to inability to eat as evidenced by NPO status.  GOAL:   Patient will meet greater than or equal to 90% of their needs -previously met with tube feeds   MONITOR:   Labs, Weight trends, TF tolerance, I & O's, Skin  ASSESSMENT:   52 y/o male with h/o intellectual disability, schizoeffective disorder, bipolar, anxiety, depression, dementia, HLD, chronic constipation, hpothyroidism and drug-induced parkinsons who was admitted from group home on 04/08/2023 with AMS, erratic behavior and recurrent falls and was found to have Oropharyngeal dysphagia, SIRS, neurolept malignant syndrome and rhabdomyolysis.  Pt remains NPO per SLP. NGT in place. Pt tolerating tube feeds well but was noted to have regurgitation of tube feeds this morning and feeds were held. KUB with no significant findings. Reglan given 12/21. Would recommend restarting gastric tube feeds tomorrow if no further emesis noted. Reglan could be added if appropriate to help with gastric emptying. Palliative care following and family has decided to pursue G-J tube. Fluoroscopy may be able to place a post pyloric NGT if G-J tube placement is unable to be placed this week. Recommendations were discussed with MD. Pt remains at refeed risk. Per chart, pt is down ~4lbs since admission.       Medications reviewed and  include: colace, lovenox, robinul, synthroid, reglan, midodrine, MVI, protonix, vitamin D, NaCl @75ml /hr  Labs reviewed: Na 134(L), K 4.5 wnl, creat 0.44(L), P 4.5 wnl, Mg 2.2 wnl Wbc- 11.2(H) Cbgs- 80, 92, 86 x 24 hrs   Diet Order:   Diet Order             Diet NPO time specified  Diet effective now                  EDUCATION NEEDS:   Not appropriate for education at this time  Skin:  Skin Assessment: Reviewed RN Assessment  Last BM:  12/20- type 7  Height:   Ht Readings from Last 1 Encounters:  04/03/23 5\' 8"  (1.727 m)    Weight:   Wt Readings from Last 1 Encounters:  05/03/23 75.4 kg    Ideal Body Weight:  70 kg  BMI:  Body mass index is 25.27 kg/m.  Estimated Nutritional Needs:   Kcal:  1900-2200kcal/day  Protein:  95-110g/day  Fluid:  1.8-2.1L/day  Betsey Holiday MS, RD, LDN If unable to be reached, please send secure chat to "RD inpatient" available from 8:00a-4:00p daily

## 2023-05-03 NOTE — Care Management Important Message (Signed)
Important Message  Patient Details  Name: Jared Mayer MRN: [id number] Date of Birth: 1970-06-08   Important Message Given:  Yes - Medicare IM     Bernadette Hoit 05/03/2023, 11:52 AM

## 2023-05-03 NOTE — Progress Notes (Signed)
During handoff this morning, pt was gurgling while talking and tube feeding was coming out of the side of his mouth. Dr. Fran Lowes notified and tube feeding immediately turned off. For the next several hours, pt continued to have small to moderate amt of tube feeding liquid gather in his mouth and I would need to suction it out about every 3 hours. By early afternoon, he did not have any more tube feeding in his mouth but he would still make gurgling sounds when he talks. When I suction him, he has a small amt of tube feeding at the back of his throat. I made sure that pt was sitting up all day to keep from aspirating.

## 2023-05-03 NOTE — Progress Notes (Signed)
  PROGRESS NOTE    Jared Mayer  OZD:[id number] DOB: 1971-04-16 DOA: 04/08/2023 PCP: [facility]  225A/225A-AA  LOS: 25 days   Brief hospital course:   Assessment & Plan: Jared Mayer is a 52 y.o. male with medical history significant for intellectual disability, schizoaffective disorder, dyslipidemia, drug-induced parkinsonism, hypothyroidism, presented to ED for evaluation of altered mental status, disorientation, repetitive speech pattern, erratic behavior and recurrent falls.  Reportedly, he had been more aggressive at the group home than usual admitted to hospitalist service for further management evaluation of altered mental status.   Patient is noted to have fever, with no source of infection. He is started on bromocriptine for NMS. All psych meds held, NG tube placed for nutrition. Neurology advised Clonazepam standing dose. Bromocriptine. His mental status improved, fever better. High risk for aspiration per SLP, need PEG tube. Palliative to discuss with mother today.   Acute toxic metabolic encephalopathy SIRS Neurolept malignant syndrome Fever improved. Mental status slow improvement. Neurology follow up appreciated.  Stopped scheduled tylenol. Continue Bromocriptine 5 tid. Cont benztropin 2 mg TID --switch Clonazepam 2mg  q6hr to IV ativan equivalent for now due to regurgitation Continue to hold all antipsychotics Cooling blanket for high grade fever.   Oropharyngeal dysphagia: High risk for aspiration SLP re-evaluated him, advised strict npo. WBC 11.9, Chest xray with right sided opacity, possible aspiration pneumonitis. Need to start antibiotics if he spikes fever. --RN noted tube feed regurgitation today --hold tube feed --IR consult for PEG tube placement --MIVF for now   Hypokalemia- --monitor and supplement PRN   Hyponatremia --monitor   Hypophosphatemia: --monitor and supplement PRN Oral phos daily replacement.   Rhabdomyolysis Elevated  LFT In the setting of use of antipsychotic medications, NMS.  Hold off antipsychotics, hepatotoxic medications. LFT, CK high improving. Continue oral hydration.   Schizoaffective disorder: Psychiatry chart follow up appreciated. Hold current antipsychotics.     DVT prophylaxis: Lovenox SQ Code Status: Full code  Family Communication:  Level of care: Telemetry Medical Dispo:   The patient is from: home Anticipated d/c is to: to be determined Anticipated d/c date is: to be determined   Subjective and Interval History:  RN reported regurgitation of tube feed, tube feed therefore stopped.  Palliative provider discussed with mother, who agreed for GJ tube placement.   Objective: Vitals:   05/03/23 0329 05/03/23 0333 05/03/23 0751 05/03/23 1559  BP: 107/64  117/79 133/88  Pulse: 87  96 (!) 103  Resp: 20  20 18   Temp: 98.7 F (37.1 C)  97.7 F (36.5 C) 98 F (36.7 C)  TempSrc: Oral  Oral Oral  SpO2: 94%  100% (!) 82%  Weight:  75.4 kg      Intake/Output Summary (Last 24 hours) at 05/03/2023 1911 Last data filed at 05/03/2023 1451 Gross per 24 hour  Intake --  Output 1500 ml  Net -1500 ml   Filed Weights   04/29/23 0500 05/02/23 0500 05/03/23 0333  Weight: 83 kg 72.3 kg 75.4 kg    Examination:   Constitutional: NAD, alert, talking but nonsensical  HEENT: conjunctivae and lids normal CV: No cyanosis.   RESP: normal respiratory effort   Data Reviewed: I have personally reviewed labs and imaging studies  Time spent: 50 minutes  [name], MD [facility] If 7PM-7AM, please contact night-coverage 05/03/2023, 7:11 PM

## 2023-05-04 DIAGNOSIS — R4182 Altered mental status, unspecified: Secondary | ICD-10-CM | POA: Diagnosis not present

## 2023-05-04 LAB — GLUCOSE, CAPILLARY
Glucose-Capillary: 80 mg/dL (ref 70–99)
Glucose-Capillary: 89 mg/dL (ref 70–99)
Glucose-Capillary: 87 mg/dL (ref 70–99)
Glucose-Capillary: 85 mg/dL (ref 70–99)
Glucose-Capillary: 88 mg/dL (ref 70–99)

## 2023-05-04 MED ORDER — CLONAZEPAM 1 MG PO TABS
2.0000 mg | ORAL_TABLET | Freq: Four times a day (QID) | ORAL | Status: DC
  Administered 2023-05-04 – 2023-05-12 (×27): 2 mg
  Filled 2023-05-04 (×21): qty 2
  Filled 2023-05-04: qty 4
  Filled 2023-05-04 (×3): qty 2
  Filled 2023-05-04: qty 4
  Filled 2023-05-04: qty 2

## 2023-05-04 NOTE — TOC Progression Note (Signed)
Transition of Care [facility]) - Progression Note    Patient Details  Name: Jayke Monachino MRN: [id number] Date of Birth: 1970/10/20  [facility] CM/SW Contact  Chapman Fitch, RN Phone Number: 05/04/2023, 12:05 PM  Clinical Narrative:     TOC continues to follow progress. Patient has a bed at [facility] when stable.   IR recommending general surgery consult for GJ tube placement    Expected Discharge Plan: Assisted Living Barriers to Discharge: Continued Medical Work up  Expected Discharge Plan and Services       Living arrangements for the past 2 months: Assisted Living Facility                                       Social Determinants of Health (SDOH) Interventions SDOH Screenings   Food Insecurity: No Food Insecurity (04/12/2023)  Housing: Patient Unable To Answer (04/12/2023)  Transportation Needs: Patient Unable To Answer (04/12/2023)  Utilities: Patient Unable To Answer (04/12/2023)  Financial Resource Strain: Low Risk  (03/29/2018)  Physical Activity: Unknown (03/29/2018)  Social Connections: Unknown (03/29/2018)  Stress: No Stress Concern Present (03/29/2018)  Tobacco Use: Low Risk  (04/08/2023)    Readmission Risk Interventions     No data to display

## 2023-05-04 NOTE — Progress Notes (Signed)
  PROGRESS NOTE    Jared Mayer  [id number]:[id number] DOB: Dec 27, 1970 DOA: 04/08/2023 PCP: [facility]  225A/225A-AA  LOS: 26 days   Brief hospital course:   Assessment & Plan: Jared Mayer is a 52 y.o. male with medical history significant for intellectual disability, schizoaffective disorder, dyslipidemia, drug-induced parkinsonism, hypothyroidism, presented to ED for evaluation of altered mental status, disorientation, repetitive speech pattern, erratic behavior and recurrent falls.  Reportedly, he had been more aggressive at the group home than usual admitted to hospitalist service for further management evaluation of altered mental status.   Patient is noted to have fever, with no source of infection. He is started on bromocriptine for NMS. All psych meds held, NG tube placed for nutrition. Neurology advised Clonazepam standing dose. Bromocriptine. His mental status improved, fever better. High risk for aspiration per SLP, need PEG tube. Palliative to discuss with mother today.   Acute toxic metabolic encephalopathy SIRS Neurolept malignant syndrome Fever improved. Mental status slow improvement. Neurology follow up appreciated.  Stopped scheduled tylenol. Continue Bromocriptine 5 tid. Cont benztropin 2 mg TID --switch back from IV ativan to Clonazepam 2mg  q6hr  Continue to hold all antipsychotics Cooling blanket for high grade fever.   Oropharyngeal dysphagia: High risk for aspiration SLP re-evaluated him, advised strict npo. WBC 11.9, Chest xray with right sided opacity, possible aspiration pneumonitis. Need to start antibiotics if he spikes fever. --RN noted tube feed regurgitation  --hold tube feed --IR can not place GJ tube due to position of stomach.  GI can not place GJ tube. --will consult GenSurg for GJ tube placement   Hypokalemia- --monitor and supplement PRN   Hyponatremia --monitor   Hypophosphatemia: --monitor and supplement PRN    Rhabdomyolysis Elevated LFT In the setting of use of antipsychotic medications, NMS.  Hold off antipsychotics, hepatotoxic medications. LFT, CK high improving. Continue oral hydration.   Schizoaffective disorder: Psychiatry chart follow up appreciated. Hold current antipsychotics.     DVT prophylaxis: Lovenox SQ Code Status: Full code  Family Communication:  Level of care: Telemetry Medical Dispo:   The patient is from: home Anticipated d/c is to: to be determined Anticipated d/c date is: to be determined   Subjective and Interval History:  Pt was awake, talking but nonsensical.     Objective: Vitals:   05/03/23 1935 05/04/23 0208 05/04/23 0224 05/04/23 0811  BP: 100/64 112/75  (!) 124/50  Pulse: 86 90  100  Resp: 20 (!) 22  18  Temp: 98.9 F (37.2 C) 98.1 F (36.7 C)  98 F (36.7 C)  TempSrc: Oral   Oral  SpO2: 95% 100%  100%  Weight:   71.4 kg     Intake/Output Summary (Last 24 hours) at 05/04/2023 2040 Last data filed at 05/04/2023 1019 Gross per 24 hour  Intake --  Output 750 ml  Net -750 ml   Filed Weights   05/02/23 0500 05/03/23 0333 05/04/23 0224  Weight: 72.3 kg 75.4 kg 71.4 kg    Examination:   Constitutional: NAD, awake, verbalizing but nonsensical  HEENT: conjunctivae and lids normal, EOMI CV: No cyanosis.   RESP: normal respiratory effort   Data Reviewed: I have personally reviewed labs and imaging studies  Time spent: 35 minutes  Jared Priestly, MD [facility] If 7PM-7AM, please contact night-coverage 05/04/2023, 8:40 PM

## 2023-05-04 NOTE — Progress Notes (Signed)
IR Procedure Request - GJ tube placement  52 y.o. male inpatient. History of intellectual disability, schizoaffective disorder, HLD, dementia, drug induced parkinsonism. Presented to the ED at [facility] ith AMS and recurrent falls on 11.28.24. Found to have a fever of unknown origin, acute metabolic encephalopathy and oropharyngeal dysphagia with regurgitation. Team is requesting a  GJ tube placement.   Case reviewed by IR Attending Dr. Ruel Favors. CT CAP 12.8.24 shows the stomach  location is to high and not accessible for percutaneous access. Recommend consulting general surgery. This was communicated to the Team via [employer]

## 2023-05-04 NOTE — Progress Notes (Signed)
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 Electronically Signed   By: Kennith Center M.D.   On: 05/02/2023 11:36       Medications: Scheduled:  benztropine  2 mg Per Tube TID   bromocriptine  5 mg Per NG tube TID   chlorhexidine  7.5 mL Mouth Rinse BID   clonazePAM  2 mg  Per Tube Q6H   docusate  100 mg Per Tube BID   enoxaparin (LOVENOX) injection  40 mg Subcutaneous Q24H   feeding supplement (PROSource TF20)  60 mL Per Tube BID   free water  150 mL Per Tube Q4H   Gerhardt's butt cream  Topical BID   glycopyrrolate  1 mg Per Tube BID   levothyroxine  125 mcg Per NG tube Q0600   metoCLOPramide (REGLAN) injection  10 mg Intravenous Once   midodrine  5 mg Per NG tube BID WC   multivitamin with minerals  1 tablet Per Tube Daily   pantoprazole (PROTONIX) IV  40 mg Intravenous Q24H   polyethylene glycol  34 g Per Tube Daily   Vitamin D (Ergocalciferol)  50,000 Units Oral Q7 days          Assessment/Plan: 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 Differential includes malignant catatonia, less likely serotonin syndrome without hyperreflexia on exam. Based on workup as above, low concern for infection. EEG negative for seizure   Patient and lab work improved.  Patient on Clonazepam and Bromocriptine.  Benztropine increased on yesterday.   CXR suggestive of PNA vs atelectasis which may explain increased wbc count.     Recommendations: -Continue Clonazepam 2 mg every 6 hours  -Continue bromocriptine (started on 12/12 at 2.5 mg 3 times daily increased to 5 mg 3 times daily on 12/13) -Continue Benztropine 2 mg TID; hold for RASS < -1  -Continue to hold other psychoactive medications at this time, would not restart any of these without reinvolvement of psychiatry.  -Strict avoidance of all neuroleptics and anti-dopaminergic agents at this time -Appreciate management of comorbidities per primary team -Neurology will continue to follow      LOS: 26 days   Thana Farr, MD Neurology  05/04/2023  1:57 PM

## 2023-05-05 ENCOUNTER — Inpatient Hospital Stay: Payer: Medicare HMO

## 2023-05-05 DIAGNOSIS — R4182 Altered mental status, unspecified: Secondary | ICD-10-CM | POA: Diagnosis not present

## 2023-05-05 LAB — CBC
HCT: 36.6 % — ABNORMAL LOW (ref 39.0–52.0)
HCT: 36.2 % — ABNORMAL LOW (ref 39.0–52.0)
Hemoglobin: 12.6 g/dL — ABNORMAL LOW (ref 13.0–17.0)
Hemoglobin: 12.4 g/dL — ABNORMAL LOW (ref 13.0–17.0)
MCH: 32.5 pg (ref 26.0–34.0)
MCH: 32.2 pg (ref 26.0–34.0)
MCHC: 34.4 g/dL (ref 30.0–36.0)
MCHC: 34.3 g/dL (ref 30.0–36.0)
MCV: 94.3 fL (ref 80.0–100.0)
MCV: 94 fL (ref 80.0–100.0)
Platelets: 486 10*3/uL — ABNORMAL HIGH (ref 150–400)
Platelets: 538 10*3/uL — ABNORMAL HIGH (ref 150–400)
RBC: 3.88 MIL/uL — ABNORMAL LOW (ref 4.22–5.81)
RBC: 3.85 MIL/uL — ABNORMAL LOW (ref 4.22–5.81)
RDW: 13.7 % (ref 11.5–15.5)
RDW: 13.7 % (ref 11.5–15.5)
WBC: 8.9 10*3/uL (ref 4.0–10.5)
WBC: 9.4 10*3/uL (ref 4.0–10.5)
nRBC: 0 % (ref 0.0–0.2)
nRBC: 0 % (ref 0.0–0.2)

## 2023-05-05 LAB — BASIC METABOLIC PANEL
Anion gap: 13 (ref 5–15)
Anion gap: 11 (ref 5–15)
BUN: 19 mg/dL (ref 6–20)
BUN: 21 mg/dL — ABNORMAL HIGH (ref 6–20)
CO2: 22 mmol/L (ref 22–32)
CO2: 22 mmol/L (ref 22–32)
Calcium: 8.5 mg/dL — ABNORMAL LOW (ref 8.9–10.3)
Calcium: 8.8 mg/dL — ABNORMAL LOW (ref 8.9–10.3)
Chloride: 99 mmol/L (ref 98–111)
Chloride: 100 mmol/L (ref 98–111)
Creatinine, Ser: 0.53 mg/dL — ABNORMAL LOW (ref 0.61–1.24)
Creatinine, Ser: 0.71 mg/dL (ref 0.61–1.24)
GFR, Estimated: >60 mL/min (ref >60)
GFR, Estimated: >60 mL/min (ref >60)
Glucose, Bld: 87 mg/dL (ref 70–99)
Glucose, Bld: 125 mg/dL — ABNORMAL HIGH (ref 70–99)
Potassium: 4 mmol/L (ref 3.5–5.1)
Potassium: 3.8 mmol/L (ref 3.5–5.1)
Sodium: 134 mmol/L — ABNORMAL LOW (ref 135–145)
Sodium: 133 mmol/L — ABNORMAL LOW (ref 135–145)

## 2023-05-05 LAB — GLUCOSE, CAPILLARY
Glucose-Capillary: 106 mg/dL — ABNORMAL HIGH (ref 70–99)
Glucose-Capillary: 114 mg/dL — ABNORMAL HIGH (ref 70–99)
Glucose-Capillary: 80 mg/dL (ref 70–99)
Glucose-Capillary: 89 mg/dL (ref 70–99)
Glucose-Capillary: 92 mg/dL (ref 70–99)
Glucose-Capillary: 95 mg/dL (ref 70–99)

## 2023-05-05 LAB — LACTIC ACID, PLASMA: Lactic Acid, Venous: 1.1 mmol/L (ref 0.5–1.9)

## 2023-05-05 LAB — MAGNESIUM: Magnesium: 2.2 mg/dL (ref 1.7–2.4)

## 2023-05-05 MED ORDER — SODIUM CHLORIDE 0.9 % IV BOLUS
500.0000 mL | Freq: Once | INTRAVENOUS | Status: AC
  Administered 2023-05-05: 500 mL via INTRAVENOUS

## 2023-05-05 MED ORDER — IBUPROFEN 100 MG/5ML PO SUSP
600.0000 mg | Freq: Once | ORAL | Status: AC
  Administered 2023-05-05: 600 mg
  Filled 2023-05-05: qty 30

## 2023-05-05 MED ORDER — BENZTROPINE MESYLATE 1 MG PO TABS
2.0000 mg | ORAL_TABLET | Freq: Three times a day (TID) | ORAL | Status: DC
  Administered 2023-05-05 – 2023-05-11 (×18): 2 mg
  Filled 2023-05-05 (×19): qty 2

## 2023-05-05 MED ORDER — DIAZEPAM 5 MG/ML IJ SOLN
2.5000 mg | Freq: Once | INTRAMUSCULAR | Status: AC
  Administered 2023-05-05: 2.5 mg via INTRAVENOUS
  Filled 2023-05-05: qty 2

## 2023-05-05 MED ORDER — ACETAMINOPHEN 325 MG PO TABS
975.0000 mg | ORAL_TABLET | Freq: Once | ORAL | Status: AC
  Administered 2023-05-05: 975 mg
  Filled 2023-05-05: qty 3

## 2023-05-05 MED ORDER — BACLOFEN 5 MG/5ML PO SOLN
5.0000 mg | Freq: Two times a day (BID) | ORAL | Status: DC
  Administered 2023-05-05 (×2): 5 mg
  Filled 2023-05-05 (×3): qty 5

## 2023-05-05 NOTE — Plan of Care (Signed)

## 2023-05-05 NOTE — Progress Notes (Signed)
       CROSS COVER NOTE  NAME: Jared Mayer MRN: [id number] DOB : 09-08-1970    Date of Service   05/05/2023   HPI/Events of Note   RRT called by care nurse because patient was found to be febrile at 103 and altered.  Care nurse reported that patient was more altered than he was yesterday when she was caring for him.  Chart review showed that patient was being treated for NMS and had received his meds prior.  Patient does not appear to be having an episode of the neuro malignant syndrome at this time but rather possible reaction to aspirating based on chart review.  Interventions   -Ordered Tylenol to help with fever. - Sent for CBC, BMP and lactic acid to ascertain any markers for possible infection. - Chest x-ray obtained after consulting with attending physician Dr. Inetta Fermo.  X-ray results show no infiltrates on the left lung.  This correlates to possible aspiration patient has been having with the NG tube and tube feeds.  Tube feeds were held.      Dai Mcadams Lamin Geradine Girt, MSN, APRN, AGACNP-BC [facility] [place] Pager: [phone]. Check Amion for Availability

## 2023-05-05 NOTE — Progress Notes (Addendum)
  PROGRESS NOTE    Jared Mayer  ZOX:[id number] DOB: [birth year] DOA: 04/08/2023 PCP: [facility]  [id number]  LOS: 27 days   Brief hospital course:   Assessment & Plan: [name] is a 52 y.o. male with medical history significant for intellectual disability, schizoaffective disorder, dyslipidemia, drug-induced parkinsonism, hypothyroidism, presented to ED for evaluation of altered mental status, disorientation, repetitive speech pattern, erratic behavior and recurrent falls.  Reportedly, he had been more aggressive at the group home than usual admitted to hospitalist service for further management evaluation of altered mental status.   Patient is noted to have fever, with no source of infection. He is started on bromocriptine for NMS. All psych meds held, NG tube placed for nutrition. Neurology advised Clonazepam standing dose. Bromocriptine. His mental status improved, fever better. High risk for aspiration per SLP, need PEG tube. Palliative to discuss with mother today.   Acute toxic metabolic encephalopathy SIRS Neurolept malignant syndrome Neurology follow up appreciated.  Stopped scheduled tylenol, remained afebrile.  Mental status improved, more alert. Continue Bromocriptine 5 tid. Cont benztropin 2 mg TID --cont clonazepam 2mg  q6h --Start Baclofen 5mg  BID, per neuro Continue to hold all antipsychotics   Oropharyngeal dysphagia: High risk for aspiration SLP re-evaluated him, advised strict npo. WBC 11.9, Chest xray with right sided opacity, possible aspiration pneumonitis. Need to start antibiotics if he spikes fever. --RN noted tube feed regurgitation, tube feed held --hold tube feed --IR can not place GJ tube due to position of stomach.  GI can not place GJ tube. --resume tube feed today --consult GenSurg today for GJ tube placement, tentative for Friday   Hypokalemia- --monitor and supplement PRN   Hyponatremia --monitor    Hypophosphatemia: --monitor and supplement PRN   Rhabdomyolysis Elevated LFT In the setting of use of antipsychotic medications, NMS.  Hold off antipsychotics, hepatotoxic medications. LFT, CK high improving. Continue oral hydration.   Schizoaffective disorder: Psychiatry chart follow up appreciated. --hold antipsychotics    DVT prophylaxis: Lovenox SQ Code Status: Full code  Family Communication:  Level of care: Telemetry Medical Dispo:   The patient is from: home Anticipated d/c is to: to be determined Anticipated d/c date is: to be determined   Subjective and Interval History:  Resume tube feed today.  Pt has remained afebrile.  GenSurg consulted today for GJ tube placement.   Objective: Vitals:   05/05/23 0446 05/05/23 0500 05/05/23 0835 05/05/23 1628  BP: 104/63  137/67 110/63  Pulse: (!) 102  (!) 101 71  Resp: 20  18 18   Temp: 97.7 F (36.5 C)  98.8 F (37.1 C) 98.4 F (36.9 C)  TempSrc: Oral  Oral Oral  SpO2: 100%  100% (!) 82%  Weight:  68.5 kg     No intake or output data in the 24 hours ending 05/05/23 1656  Filed Weights   05/03/23 0333 05/04/23 0224 05/05/23 0500  Weight: 75.4 kg 71.4 kg 68.5 kg    Examination:   Constitutional: NAD, alert, nonsensical verbalization HEENT: conjunctivae and lids normal, EOMI CV: No cyanosis.   RESP: normal respiratory effort   Data Reviewed: I have personally reviewed labs and imaging studies  Time spent: 35 minutes  [name], MD [facility] If 7PM-7AM, please contact night-coverage 05/05/2023, 4:56 PM

## 2023-05-05 NOTE — Progress Notes (Signed)
   05/05/23 1900  Spiritual Encounters  Type of Visit Initial  Care provided to: Patient  Conversation partners present during encounter Nurse  Referral source Trauma page  Reason for visit Code  OnCall Visit Yes   Chaplain responded to a rapid response. Medical staff were assisting the patient. There was no family present at this time. Chaplain services remain available for support.

## 2023-05-05 NOTE — Progress Notes (Addendum)
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 workup as above, low concern for infection. EEG negative for seizure   Patient on Benztropine, Clonazepam and Bromocriptine.  Seems to have more dystonia today.      Recommendations: -Continue Clonazepam to 2 mg every 6 hours  -Continue bromocriptine (started on 12/12 at 2.5 mg 3 times daily increased to 5 mg 3 times daily on 12/13) -Continue Benztropine 2 mg TID; hold for RASS < -1  -Start Baclofen 5mg  BID.  May need further increases but would follow for sedation and respiratory depression prior to increases -Continue to hold other psychoactive medications at this time, would not restart any of these without reinvolvement of psychiatry.  -Strict avoidance of all neuroleptics and anti-dopaminergic agents at this time -Appreciate management of comorbidities per primary team -Neurology will continue to follow   LOS: 27 days   Thana Farr, MD Neurology  05/05/2023  10:40 AM

## 2023-05-05 NOTE — Significant Event (Signed)
Rapid Response Event Note   Reason for Call :  Increased lethargy, increased work of breathing, febrile,   Initial Focused Assessment:  Upon entering room, patient appears lethargic and tachypneic. He is febrile to 103 axillary, on 15L non-rebreather, and tachycardic to the 120s.  He wakes to strong physical stimulation, unable to communicate effectively but has a history of difficulty at baseline. With auscultation he sounds rhonchorous/"junky", with encouragement he was able to cough and clear his airway, resulting in his oxygen saturation increasing.   Randye Lobo NP at bedside to evaluate.   Interventions:  IV tylenol, labs, cxr, encourage coughing  Plan of Care:  IV tylenol to break fever, continue to monitor   Event Summary:   MD Notified: Randye Lobo NP Call Time: 4098 Arrival Time: 1940 End Time: 2000  Astrid Drafts, RN

## 2023-05-06 ENCOUNTER — Inpatient Hospital Stay: Payer: Medicare HMO

## 2023-05-06 DIAGNOSIS — R4182 Altered mental status, unspecified: Secondary | ICD-10-CM | POA: Diagnosis not present

## 2023-05-06 DIAGNOSIS — F02C18 Dementia in other diseases classified elsewhere, severe, with other behavioral disturbance: Secondary | ICD-10-CM

## 2023-05-06 DIAGNOSIS — J9601 Acute respiratory failure with hypoxia: Secondary | ICD-10-CM

## 2023-05-06 DIAGNOSIS — Z7189 Other specified counseling: Secondary | ICD-10-CM | POA: Diagnosis not present

## 2023-05-06 DIAGNOSIS — E46 Unspecified protein-calorie malnutrition: Secondary | ICD-10-CM

## 2023-05-06 DIAGNOSIS — Z66 Do not resuscitate: Secondary | ICD-10-CM | POA: Diagnosis not present

## 2023-05-06 DIAGNOSIS — Z515 Encounter for palliative care: Secondary | ICD-10-CM | POA: Diagnosis not present

## 2023-05-06 DIAGNOSIS — G3183 Dementia with Lewy bodies: Secondary | ICD-10-CM | POA: Diagnosis not present

## 2023-05-06 LAB — URINALYSIS, COMPLETE (UACMP) WITH MICROSCOPIC
Bilirubin Urine: NEGATIVE
Glucose, UA: NEGATIVE mg/dL
Ketones, ur: NEGATIVE mg/dL
Leukocytes,Ua: NEGATIVE
Nitrite: NEGATIVE
Protein, ur: 30 mg/dL — AB
Specific Gravity, Urine: 1.033 — ABNORMAL HIGH (ref 1.005–1.030)
Squamous Epithelial / HPF: 0 /[HPF] (ref 0–5)
pH: 5 (ref 5.0–8.0)

## 2023-05-06 LAB — GLUCOSE, CAPILLARY
Glucose-Capillary: 99 mg/dL (ref 70–99)
Glucose-Capillary: 89 mg/dL (ref 70–99)
Glucose-Capillary: 71 mg/dL (ref 70–99)
Glucose-Capillary: 91 mg/dL (ref 70–99)
Glucose-Capillary: 88 mg/dL (ref 70–99)

## 2023-05-06 LAB — BLOOD GAS, VENOUS
Acid-base deficit: 1.5 mmol/L (ref 0.0–2.0)
Bicarbonate: 24.8 mmol/L (ref 20.0–28.0)
FIO2: 100 %
MECHVT: 500 mL
Mechanical Rate: 20
O2 Saturation: 73.1 %
PEEP: 8 cmH2O
Patient temperature: 37
pCO2, Ven: 47 mm[Hg] (ref 44–60)
pH, Ven: 7.33 (ref 7.25–7.43)
pO2, Ven: 44 mm[Hg] (ref 32–45)

## 2023-05-06 LAB — MRSA NEXT GEN BY PCR, NASAL: MRSA by PCR Next Gen: NOT DETECTED

## 2023-05-06 MED ORDER — PROPOFOL 1000 MG/100ML IV EMUL
0.0000 ug/kg/min | INTRAVENOUS | Status: DC
  Administered 2023-05-06: 5 ug/kg/min via INTRAVENOUS
  Filled 2023-05-06: qty 100

## 2023-05-06 MED ORDER — MIDODRINE HCL 5 MG PO TABS
5.0000 mg | ORAL_TABLET | Freq: Three times a day (TID) | ORAL | Status: DC
  Administered 2023-05-06 – 2023-05-11 (×15): 5 mg via NASOGASTRIC
  Filled 2023-05-06 (×15): qty 1

## 2023-05-06 MED ORDER — NOREPINEPHRINE 4 MG/250ML-% IV SOLN
0.0000 ug/min | INTRAVENOUS | Status: DC
  Administered 2023-05-06: 2 ug/min via INTRAVENOUS
  Administered 2023-05-06: 6 ug/min via INTRAVENOUS
  Administered 2023-05-09: 5 ug/min via INTRAVENOUS
  Administered 2023-05-10: 6 ug/min via INTRAVENOUS
  Administered 2023-05-11: 7 ug/min via INTRAVENOUS
  Filled 2023-05-06 (×8): qty 250

## 2023-05-06 MED ORDER — ACETAMINOPHEN 325 MG PO TABS
650.0000 mg | ORAL_TABLET | ORAL | Status: AC
  Administered 2023-05-06: 650 mg
  Filled 2023-05-06: qty 2

## 2023-05-06 MED ORDER — LIDOCAINE HCL 1 % IJ SOLN
10.0000 mL | Freq: Once | INTRAMUSCULAR | Status: AC
  Filled 2023-05-06: qty 10

## 2023-05-06 MED ORDER — ETOMIDATE 2 MG/ML IV SOLN
10.0000 mg | Freq: Once | INTRAVENOUS | Status: AC
  Administered 2023-05-06: 10 mg via INTRAVENOUS
  Filled 2023-05-06: qty 10

## 2023-05-06 MED ORDER — MIDAZOLAM HCL 2 MG/2ML IJ SOLN
2.0000 mg | Freq: Once | INTRAMUSCULAR | Status: AC
  Administered 2023-05-06: 2 mg via INTRAVENOUS
  Filled 2023-05-06: qty 2

## 2023-05-06 MED ORDER — SODIUM CHLORIDE 0.9 % IV SOLN
3.0000 g | Freq: Four times a day (QID) | INTRAVENOUS | Status: DC
  Administered 2023-05-06: 3 g via INTRAVENOUS
  Filled 2023-05-06 (×2): qty 8

## 2023-05-06 MED ORDER — DOCUSATE SODIUM 50 MG/5ML PO LIQD
100.0000 mg | Freq: Two times a day (BID) | ORAL | Status: DC
  Administered 2023-05-06 – 2023-05-11 (×11): 100 mg
  Filled 2023-05-06 (×11): qty 10

## 2023-05-06 MED ORDER — LACTATED RINGERS IV BOLUS
500.0000 mL | Freq: Once | INTRAVENOUS | Status: AC
  Administered 2023-05-06: 500 mL via INTRAVENOUS

## 2023-05-06 MED ORDER — CHLORHEXIDINE GLUCONATE CLOTH 2 % EX PADS
6.0000 | MEDICATED_PAD | Freq: Every day | CUTANEOUS | Status: DC
  Administered 2023-05-06 – 2023-05-11 (×6): 6 via TOPICAL

## 2023-05-06 MED ORDER — NOREPINEPHRINE 4 MG/250ML-% IV SOLN
INTRAVENOUS | Status: AC
  Filled 2023-05-06: qty 250

## 2023-05-06 MED ORDER — PIPERACILLIN-TAZOBACTAM 3.375 G IVPB
3.3750 g | Freq: Three times a day (TID) | INTRAVENOUS | Status: DC
  Administered 2023-05-06 – 2023-05-07 (×3): 3.375 g via INTRAVENOUS
  Filled 2023-05-06 (×3): qty 50

## 2023-05-06 MED ORDER — SODIUM CHLORIDE 0.9 % IV SOLN
2.0000 g | INTRAVENOUS | Status: DC
  Administered 2023-05-06: 2 g via INTRAVENOUS
  Filled 2023-05-06: qty 20

## 2023-05-06 MED ORDER — SODIUM CHLORIDE 0.9 % IV SOLN
1500.0000 mg | Freq: Once | INTRAVENOUS | Status: AC
  Administered 2023-05-06: 1500 mg via INTRAVENOUS
  Filled 2023-05-06: qty 30

## 2023-05-06 MED ORDER — DEXMEDETOMIDINE HCL IN NACL 400 MCG/100ML IV SOLN
0.0000 ug/kg/h | INTRAVENOUS | Status: DC
Start: 2023-05-06 — End: 2023-05-08
  Administered 2023-05-06: 0.4 ug/kg/h via INTRAVENOUS
  Administered 2023-05-06: 0.7 ug/kg/h via INTRAVENOUS
  Administered 2023-05-07: 1.2 ug/kg/h via INTRAVENOUS
  Administered 2023-05-07 – 2023-05-08 (×2): 0.7 ug/kg/h via INTRAVENOUS
  Filled 2023-05-06 (×6): qty 100

## 2023-05-06 MED ORDER — FENTANYL 2500MCG IN NS 250ML (10MCG/ML) PREMIX INFUSION
50.0000 ug/h | INTRAVENOUS | Status: DC
  Administered 2023-05-06: 50 ug/h via INTRAVENOUS
  Administered 2023-05-08: 150 ug/h via INTRAVENOUS
  Administered 2023-05-08: 50 ug/h via INTRAVENOUS
  Administered 2023-05-09 – 2023-05-11 (×4): 125 ug/h via INTRAVENOUS
  Filled 2023-05-06 (×6): qty 250

## 2023-05-06 MED ORDER — POLYETHYLENE GLYCOL 3350 17 G PO PACK
17.0000 g | PACK | Freq: Every day | ORAL | Status: DC
  Administered 2023-05-06 – 2023-05-11 (×6): 17 g
  Filled 2023-05-06 (×6): qty 1

## 2023-05-06 MED ORDER — FENTANYL CITRATE PF 50 MCG/ML IJ SOSY
PREFILLED_SYRINGE | INTRAMUSCULAR | Status: AC
  Administered 2023-05-06: 50 ug via INTRAVENOUS
  Filled 2023-05-06: qty 1

## 2023-05-06 MED ORDER — IBUPROFEN 400 MG PO TABS
200.0000 mg | ORAL_TABLET | Freq: Four times a day (QID) | ORAL | Status: DC | PRN
  Administered 2023-05-06 – 2023-05-07 (×2): 200 mg via NASOGASTRIC
  Filled 2023-05-06 (×2): qty 1

## 2023-05-06 MED ORDER — ROCURONIUM BROMIDE 10 MG/ML (PF) SYRINGE
100.0000 mg | PREFILLED_SYRINGE | Freq: Once | INTRAVENOUS | Status: AC
  Administered 2023-05-06: 70 mg via INTRAVENOUS
  Filled 2023-05-06: qty 10

## 2023-05-06 MED ORDER — FENTANYL CITRATE PF 50 MCG/ML IJ SOSY
100.0000 ug | PREFILLED_SYRINGE | Freq: Once | INTRAMUSCULAR | Status: AC
  Administered 2023-05-06: 100 ug via INTRAVENOUS
  Filled 2023-05-06: qty 2

## 2023-05-06 MED ORDER — LIDOCAINE HCL 1 % IJ SOLN
INTRAMUSCULAR | Status: AC
  Administered 2023-05-06: 10 mL via INTRADERMAL
  Filled 2023-05-06: qty 10

## 2023-05-06 MED ORDER — FENTANYL CITRATE PF 50 MCG/ML IJ SOSY: 50.0000 ug | PREFILLED_SYRINGE | Freq: Once | INTRAMUSCULAR | Status: AC

## 2023-05-06 MED ORDER — VANCOMYCIN HCL 1250 MG/250ML IV SOLN
1250.0000 mg | Freq: Two times a day (BID) | INTRAVENOUS | Status: DC
Start: 2023-05-07 — End: 2023-05-08
  Administered 2023-05-07 – 2023-05-08 (×3): 1250 mg via INTRAVENOUS
  Filled 2023-05-06 (×4): qty 250

## 2023-05-06 MED ORDER — FENTANYL BOLUS VIA INFUSION
50.0000 ug | INTRAVENOUS | Status: DC | PRN
  Administered 2023-05-06 (×2): 50 ug via INTRAVENOUS
  Administered 2023-05-10: 75 ug via INTRAVENOUS

## 2023-05-06 NOTE — Progress Notes (Addendum)
SLP Cancellation Note  Patient Details Name: Jared Mayer MRN: [id number] DOB: December 18, 1970   Cancelled treatment:       Reason Eval/Treat Not Completed: Medical issues which prohibited therapy;Patient not medically ready (chart reviewed - decline in status > CCU and intubation)  Per chart notes this morning, 05/06/23:   "Rapid response called by care nurse because patient was reportedly found extremely short of breath with blood oxygen saturations in the 70s.  Patient was difficult to arouse and was not following commands.  During bedside assessment patient working extremely hard patient placed on a nonrebreather with sats in the low 90s.  Patient was suctioned by RT to help clear secretions.  Patient's condition did not improve after this.  ICU was consulte and patient was transferred to the unit for possible intubation.".     Pt has been NPO w/ NGT TFs only.  Pt is now in CCU and orally intubated; bronchoscopy - revealed "Extensive secretions noted in all lobes bilaterallyu that were suctioned and cleared.". MDs and Palliative Care addressing his care; GOC w/ POA.    Pt is Not appropriate for consideration of oral intake; has not been per recent assessments. He has been recommended NPO w/ alternative means of feeding support per GOC.  Recommend f/u w/ MDs/Palliative for ongoing support and discussion. ST services would recommend a more long term alternative means of feeding support if warranted for Discharge.  ST services will sign off currently; can be considered if warranted in the future post Discharge from hospital and recovery from illness. Recommend frequent oral care for hygiene and stimulation of swallowing; Reflux precautions w/ any TF.     Jerilynn Som, MS, CCC-SLP Speech Language Pathologist Rehab Services; [facility] [phone] (ascom) Eithen Castiglia 05/06/2023, 3:46 PM

## 2023-05-06 NOTE — Procedures (Signed)
Central Venous Catheter Insertion Procedure Note  Jared Mayer  [id number]  01/17/1971  Date:05/06/23  Time:7:05 AM   Provider Performing:Anastasiya Gowin A Rorik Vespa   Procedure: Insertion of Non-tunneled Central Venous Catheter(36556) with US guidance (65784)   Indication(s) Medication administration and Difficult access  Consent Unable to obtain consent due to emergent nature of procedure.  Anesthesia Topical only with 1% lidocaine   Timeout Verified patient identification, verified procedure, site/side was marked, verified correct patient position, special equipment/implants available, medications/allergies/relevant history reviewed, required imaging and test results available.  Sterile Technique Maximal sterile technique including full sterile barrier drape, hand hygiene, sterile gown, sterile gloves, mask, hair covering, sterile ultrasound probe cover (if used).  Procedure Description Area of catheter insertion was cleaned with chlorhexidine and draped in sterile fashion.  With real-time ultrasound guidance a central venous catheter was placed into the left internal jugular vein. Nonpulsatile blood flow and easy flushing noted in all ports.  The catheter was sutured in place and sterile dressing applied.  Complications/Tolerance None; patient tolerated the procedure well. Chest X-ray is ordered to verify placement for internal jugular or subclavian cannulation.   Chest x-ray is not ordered for femoral cannulation.  EBL Minimal  Specimen(s) None    [name], DNP, CCRN, FNP-C, AGACNP-BC Acute Care & Family Nurse Practitioner  [facility]  See Amion for personal pager PCCM on call pager [phone] until 7 am

## 2023-05-06 NOTE — Progress Notes (Signed)
Lehman Prom stated okay to use central line.

## 2023-05-06 NOTE — Procedures (Addendum)
INTUBATION PROCEDURE NOTE  Jared Mayer  [id number]  1970/06/25  Date:05/06/23  Time:3:44 AM   Provider Performing:Gaby Harney A Calynn Ferrero   Procedure: Intubation (31500)  Indication(s) Respiratory Failure  Consent Unable to obtain consent due to emergent nature of procedure.  Anesthesia Etomidate, Fentanyl, and Rocuronium  Time Out Verified patient identification, verified procedure, site/side was marked, verified correct patient position, special equipment/implants available, medications/allergies/relevant history reviewed, required imaging and test results available.  Sterile Technique Usual hand hygeine, masks, and gloves were used  Procedure Description Patient positioned in bed supine.  Sedation given as noted above.  Patient was intubated with endotracheal tube using Glidescope.  View was Grade 1 full glottis .  Number of attempts was 1.  Colorimetric CO2 detector was consistent with tracheal placement.  Complications/Tolerance None; patient tolerated the procedure well. Chest X-ray is ordered to verify placement.  EBL Minimal  Specimen(s) None   [name], DNP, CCRN, FNP-C, AGACNP-BC Acute Care & Family Nurse Practitioner  [facility]  See Amion for personal pager PCCM on call pager [phone] until 7 am

## 2023-05-06 NOTE — Consult Note (Signed)
Patient ID: Jared Mayer, male   DOB: 12-27-1970, 52 y.o.   MRN: [id number]  HPI Jared Mayer is a 52 y.o. male t with a past medical history of Lewy body dementia with behavioral disturbances, baseline moderate intellectual disability, mood disorder, prior presentation for an intentional lamotrigine overdose, chronic hypotension on midodrine, drug-induced parkinsonism who presented initially to [facility] with increased combative behavior from assisted living.  His medications were adjusted and he was discharged back to assisted living.  However on 11/28 a day after discharge he presents back with gait instability disorientation and repetitive speech pattern as well as erratic behavior.  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  Therefore his psychotropic medications were stopped. Yesterday I was consulted for gastroduodenostomy feeding tube and patient has have dysphagia issues with reflux, regurgitation and inability to swallow and tolerate feeds. He missed that he will need both medications as well as a distal feeding and a gastrojejunostomy might be the right fit. Unfortunately his course was  complicated by acute hypoxic respiratory failure overnight requiring intubation and mechanical ventilation.  Thought to be secondary to an aspiration event.    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  GI and interventional radiology felt that approach would only be feasible with surgical intervention. HPI  Past Medical History:  Diagnosis Date   Dementia (HCC)    Dyslipidemia    Hypothyroid    Manic behavior (HCC)    Mentally disabled    Schizoaffective disorder, bipolar type (HCC)     History reviewed. No pertinent surgical history.  Family History  Problem Relation Age of Onset   Hypothyroidism Mother    Sarcoidosis  Mother     Social History Social History   Tobacco Use   Smoking status: Never   Smokeless tobacco: Never  Vaping Use   Vaping status: Never Used  Substance Use Topics   Alcohol use: Never   Drug use: Never    No Known Allergies  Current Facility-Administered Medications  Medication Dose Route Frequency Provider Last Rate Last Admin   Ampicillin-Sulbactam (UNASYN) 3 g in sodium chloride 0.9 % 100 mL IVPB  3 g Intravenous Q6H Belue, Lendon Collar, RPH 200 mL/hr at 05/06/23 0600 Infusion Verify at 05/06/23 0600   baclofen (OZOBAX) 5 MG/5ML oral solution 5 mg  5 mg Per Tube BID Thana Farr, MD   5 mg at 05/05/23 2024   benztropine (COGENTIN) tablet 2 mg  2 mg Per Tube TID Barrie Folk, RPH   2 mg at 05/05/23 2039   bromocriptine (PARLODEL) tablet 5 mg  5 mg Per NG tube TID Raechel Chute, MD   5 mg at 05/05/23 2024   chlorhexidine (PERIDEX) 0.12 % solution 7.5 mL  7.5 mL Mouth Rinse BID Mansy, Jan A, MD   7.5 mL at 05/05/23 2023   Chlorhexidine Gluconate Cloth 2 % PADS 6 each  6 each Topical Daily Jimmye Norman, NP       clonazePAM Scarlette Calico) tablet 2 mg  2 mg Per Tube Q6H [name], MD   2 mg at 05/05/23 2023   diclofenac Sodium (VOLTAREN) 1 % topical gel 1 Application  1 Application Topical BID PRN Mansy, Jan A, MD       docusate (COLACE) 50 MG/5ML liquid 100 mg  100 mg Per Tube BID Jimmye Norman, NP  enoxaparin (LOVENOX) injection 40 mg  40 mg Subcutaneous Q24H Mansy, Jan A, MD   40 mg at 05/05/23 0957   feeding supplement (OSMOLITE 1.5 CAL) liquid 1,000 mL  1,000 mL Per Tube Continuous Marcelino Duster, MD 50 mL/hr at 05/06/23 0600 Infusion Verify at 05/06/23 0600   feeding supplement (PROSource TF20) liquid 60 mL  60 mL Per Tube BID Marcelino Duster, MD   60 mL at 05/05/23 2040   fentaNYL (SUBLIMAZE) bolus via infusion 50-100 mcg  50-100 mcg Intravenous Q1H PRN Jimmye Norman, NP   50 mcg at 05/06/23 0545   fentaNYL in NS  (10mcg/ml) infusion-PREMIX  50-200 mcg/hr Intravenous Continuous Jimmye Norman, NP 5 mL/hr at 05/06/23 0600 50 mcg/hr at 05/06/23 0600   free water 30 mL  30 mL Per Tube Q4H [name], MD   30 mL at 05/05/23 2339   Gerhardt's butt cream   Topical BID Marcelino Duster, MD   Given at 05/05/23 2035   glycopyrrolate (ROBINUL) tablet 1 mg  1 mg Per Tube BID Littie Deeds, RPH   1 mg at 05/05/23 2024   levothyroxine (SYNTHROID) tablet 125 mcg  125 mcg Per NG tube [id number] Marcelino Duster, MD   125 mcg at 05/05/23 0453   magnesium hydroxide (MILK OF MAGNESIA) suspension 30 mL  30 mL Per Tube Daily PRN Meegan, Eryn, RPH       metoCLOPramide (REGLAN) injection 10 mg  10 mg Intravenous Pearletha Alfred, MD   10 mg at 05/06/23 0552   metoprolol tartrate (LOPRESSOR) injection 5 mg  5 mg Intravenous Q8H PRN Charise Killian, MD   5 mg at 04/27/23 1742   midodrine (PROAMATINE) tablet 5 mg  5 mg Per NG tube BID WC Marcelino Duster, MD   5 mg at 05/05/23 1655   multivitamin with minerals tablet 1 tablet  1 tablet Per Tube Daily Marcelino Duster, MD   1 tablet at 05/05/23 0957   nitroGLYCERIN (NITROSTAT) SL tablet 0.4 mg  0.4 mg Sublingual Q5 min PRN Mansy, Jan A, MD       norepinephrine (LEVOPHED) 4mg  in (0.016 mg/mL) premix infusion  0-40 mcg/min Intravenous Titrated Jimmye Norman, NP 7.5 mL/hr at 05/06/23 0600 2 mcg/min at 05/06/23 0600   ondansetron (ZOFRAN) tablet 4 mg  4 mg Oral Q6H PRN Mansy, Jan A, MD       Or   ondansetron [facility]) injection 4 mg  4 mg Intravenous Q6H PRN Mansy, Jan A, MD   4 mg at 04/30/23 2111   pantoprazole (PROTONIX) injection 40 mg  40 mg Intravenous Q24H Meegan, Eryn, RPH   40 mg at 05/05/23 0957   polyethylene glycol (MIRALAX / GLYCOLAX) packet 17 g  17 g Per Tube Daily Jimmye Norman, NP       propofol (DIPRIVAN) 1000 MG/100ML infusion  0-50 mcg/kg/min Intravenous Continuous Jimmye Norman, NP 10.28 mL/hr at 05/06/23  0600 25 mcg/kg/min at 05/06/23 0600   triamcinolone 0.1 % cream : eucerin cream, 1:1   Topical TID PRN Georgiann Cocker, FNP       Vitamin D (Ergocalciferol) (DRISDOL) 1.25 MG (50000 UNIT) capsule 50,000 Units  50,000 Units Oral Q7 days Lurene Shadow, MD   50,000 Units at 04/15/23 6962     Review of Systems Unable to perform ROS due to critical illness and sedation  Physical Exam Blood pressure (!) 84/64, pulse 99, temperature 99.3 F (37.4 C), temperature source Axillary, resp. rate (!) 23,  height 5\' 8"  (1.727 m), weight 69.3 kg, SpO2 100%. CONSTITUTIONAL: chronically ill intubated and sedated. EYES: Pupils are equal, round, and reactive to light, Sclera are non-icteric. EARS, NOSE, MOUTH AND THROAT: The oropharynx is clear. The oral mucosa is pink and moist. Hearing is intact to voice. LYMPH NODES:  Lymph nodes in the neck are normal. RESPIRATORY: Intubated on the vent Lungs are clear. There is normal respiratory effort, with equal breath sounds bilaterally, and without pathologic use of accessory muscles. CARDIOVASCULAR: Heart is regular without murmurs, gallops, or rubs. GI: The abdomen is  soft, nontender, and nondistended. There are no palpable masses. There is no hepatosplenomegaly. There are normal bowel sounds in all quadrants. GU: Rectal deferred.   MUSCULOSKELETAL: Normal muscle strength and tone. No cyanosis or edema.   SKIN: Turgor is good and there are no pathologic skin lesions or ulcers. NEUROLOGIC:sedated, moves all extremities and does not have focal sxs   Data Reviewed  I have personally reviewed the patient's imaging, laboratory findings and medical records.    Assessment/Plan 52 year old male with significant dementia and behavioral disturbances with some dysphagia malnutrition.  I was asked to evaluate him for gastrojejunostomy tube.  Unfortunately patient has deteriorated over the last 24 hours.  Initially I did talk to her mother who is an ER nurse and we were  thinking about gastrojejunostomy for both administration of medications as well as prolonged feeding.  At this time the patient is critically ill and I would not recommend any general anesthetics especially in the setting of his critical illness with his significant history of malignant hyperthermia.  Please let us know if the patient recovers from this major injury.  At this time I will be available.  Please note that I spent 75 minutes in this encounter including extensive review of medical records, extensive review of imaging studies, counseling the family and performing documentation Sterling Big, MD FACS General Surgeon 05/06/2023, 7:28 AM

## 2023-05-06 NOTE — Progress Notes (Signed)
eLink Physician-Brief Progress Note Patient Name: Jared Mayer DOB: 1971/01/09 MRN: [id number]   Date of Service  05/06/2023  HPI/Events of Note  Reviewed note from hospitalist. Patient had been intubated in ICU. Was on 100%/peep 5/RR 20/VT 500 when seen on camera. Sedated and not in distress. Concern for aspiration and ongoing fevers.   eICU Interventions  Vent support, serial ABG, sedation as needed, follow CXR, being given unasyn for aspiration. DVT and GI prophylaxis. CCM note pending. Call E link if needed.      Intervention Category Major Interventions: Respiratory failure - evaluation and management Evaluation Type: New Patient Evaluation  Oretha Milch 05/06/2023, 4:42 AM

## 2023-05-06 NOTE — Plan of Care (Signed)

## 2023-05-06 NOTE — Progress Notes (Signed)
NAME:  Jared Mayer, MRN:  [id number], DOB:  [birth year], LOS: 28 ADMISSION DATE:  04/08/2023  History of Present Illness:  This is a case of a 52 year old male patient with a past medical history of Lewy body dementia with behavioral disturbances, baseline moderate intellectual disability, mood disorder, prior presentation for an intentional lamotrigine overdose, chronic hypotension on midodrine, drug-induced parkinsonism who presented initially to [facility] with increased combative behavior from assisted living.  His medications were adjusted and he was discharged back to assisted living.  However on 11/28 a day after discharge he presents back with gait instability disorientation and repetitive speech pattern as well as erratic behavior.  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  Therefore his psychotropic medications were stopped.  Unfortunately course complicated by acute hypoxic respiratory failure overnight requiring intubation and mechanical ventilation.  Thought to be secondary to an aspiration event.  The only changes in medications was starting baclofen yesterday.  He has been on benztropine, clonazepam and bromocriptine.  Chest x-ray shows high endotracheal tube that was advanced by 2 cm, left IJ in place.  No significant parenchymal lung disease.  OG tube in place.  Started on ceftriaxone for aspiration pneumonia.   Pertinent  Medical History  -- Bipolar disorder schizoaffective features has been on significant mood stabilizers and antipsychotics including lamotrigine, topiramate, clonazepam, paroxetine, valproic acid, Ingrezza, loxapine, olanzapine.  Currently on benztropine clonazepam and bromocriptine  Significant Hospital Events: Including procedures, antibiotic start and stop dates in addition to other pertinent events   05/06/2023 intubated and mechanically ventilated for  hypoxic respiratory failure secondary to aspiration event.  Interim History / Subjective:  On encounter this morning able to follow minimal commands has significant secretions through the ET tube.  Objective   Blood pressure 99/62, pulse (!) 109, temperature (!) 102.2 F (39 C), temperature source Axillary, resp. rate (!) 22, height 5\' 8"  (1.727 m), weight 69.3 kg, SpO2 96%.    Vent Mode: PRVC FiO2 (%):  [50 %-100 %] 50 % Set Rate:  [20 bmp] 20 bmp Vt Set:  [500 mL] 500 mL PEEP:  [5 cmH20-8 cmH20] 5 cmH20 Plateau Pressure:  [14 cmH20] 14 cmH20   Intake/Output Summary (Last 24 hours) at 05/06/2023 1214 Last data filed at 05/06/2023 0600 Gross per 24 hour  Intake 81.15 ml  Output 1200 ml  Net -1118.85 ml   Filed Weights   05/04/23 0224 05/05/23 0500 05/06/23 0335  Weight: 71.4 kg 68.5 kg 69.3 kg    Examination: General: Intubated sedated able to follow minimal commands HENT: Supple neck reactive pupils Lungs: Diffuse rhonchi heard bilaterally Cardiovascular: Normal S1, normal S2, regular rate and rhythm Abdomen: Soft nontender nondistended positive bowel sounds Extremities: Warm well-perfused no edema  Labs and imaging were reviewed  Assessment & Plan:  Case of a 52 year old male patient with a past medical history of bipolar disorder, Lewy body dementia with behavioral disturbances and significant psychotropic medications was on the floor since 11/28 with neuroleptic malignant syndrome now course complicated by acute hypoxic respiratory failure requiring intubation and mechanical ventilation now on 12/26 for aspiration pneumonia.  # Acute hypoxic respiratory failure secondary to... 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  CNS: Precedex for RASS 0 to -1. Fentanyl PRN for CPOT > 2. C.w Benztropine, Clonazepam and bromocriptine.  CVS: NE MAPs> 65. LR 500 cc once.  Pulm: Bronch for airway clearance. MRSA swab. Vanc and zosyn.  GI: Hold tube feeds for now. PPI for prophylaxis.  Renal: Monitor uop.  Heme: Lovenox for DVT px.  Endo: POC 140-180  Best Practice (right click and "Reselect all SmartList Selections" daily)   Diet/type: NPO DVT prophylaxis prophylactic heparin  Pressure ulcer(s): N/A GI prophylaxis: PPI Lines: N/A Foley:  Yes, and it is still needed Code Status:  full code Discussed with Mom [name]) ED nurse and we ll meet this afternoon for a full update.   Last date of multidisciplinary goals of care discussion [05/06/2023]  I spent 60 minutes caring for this patient today, including preparing to see the patient, obtaining a medical history , reviewing a separately obtained history, performing a medically appropriate examination and/or evaluation, counseling and educating the patient/family/caregiver, ordering medications, tests, or procedures, documenting clinical information in the electronic health record, and independently interpreting results (not separately reported/billed) and communicating results to the patient/family/caregiver  Janann Colonel, MD [facility] 05/06/2023 1:44 PM

## 2023-05-06 NOTE — Progress Notes (Signed)
NEUROLOGY CONSULT FOLLOW UP NOTE   Date of service: May 06, 2023 Patient Name: Jared Mayer MRN:  [id number] DOB:  12-05-1970  Brief HPI  Jared Mayer is a 52 y.o. male who presented with  past medical history significant for possible Lewy body dementia with behavioral disturbance, baseline moderate intellectual disability, mood disorder (schizoaffective disorder bipolar type), prior presentations for unintentional lamotrigine overdose, chronic hypotension on midodrine, hyperlipidemia, drug-induced parkinsonism, chronic right-sided weakness.   He had been haivng increasing problems with aggression and on 11/22 his loxapin was increased(40 for a 14 day supply). HBis CK was noted to be elevated and was attributed to rhabdomyolysis.   He was noted to have some disorientation and gait instability on 11/28 as well as significant aggression. He was found to be febrile at that time and started on abx. His loxapine was continued until 12/11.  His CK peaked on 12/10 at 1500.   On 12/11, psychiatry was reconsulted due to significant worsening with contorted face, rigid extremities. It was at this time that NMS was considered and loxapine was stopped.   Due to poor GI motility, he was started on metoclopramide 10mg  Q8H on 12/23. It was noted tha the continued to have increased tone yesterday and therefore baclofen 5mg  BID was added.     Interval Hx/subjective   He had worsened mental status overnight and was intubated.   Vitals   Vitals:   05/06/23 0900 05/06/23 1000 05/06/23 1100 05/06/23 1123  BP: (!) 87/65 (!) 88/63 (!) 87/69   Pulse: 93 97 97   Resp: (!) 21 20 20    Temp:    (!) 102.2 F (39 C)  TempSrc:    Axillary  SpO2: 98% 99% 99%   Weight:      Height:         Body mass index is 23.23 kg/m.  Physical Exam   General: in bed, intubated.   Neurologic Examination   MS: awakens to voice, appears to follow command to wiggle toes, but not reliably.  CN: crosses midline in both  directions,  Motor: His tone is not diffusely increased, but he doe shave bilateral fott flexion and wirst flexion consistent with increased tone. More proximally, it is not as apparent.  Sensory: responds to nox stim x 4.   Labs and Diagnostic Imaging   CBC:  Recent Labs  Lab 05/05/23 0714 05/05/23 1954  WBC 8.9 9.4  HGB 12.6* 12.4*  HCT 36.6* 36.2*  MCV 94.3 94.0  PLT 486* 538*    Basic Metabolic Panel:  Lab Results  Component Value Date   NA 133 (L) 05/05/2023   K 3.8 05/05/2023   CO2 22 05/05/2023   GLUCOSE 125 (H) 05/05/2023   BUN 21 (H) 05/05/2023   CREATININE 0.71 05/05/2023   CALCIUM 8.8 (L) 05/05/2023   GFRNONAA >60 05/05/2023   GFRAA >60 04/01/2018   Lipid Panel: No results found for: "LDLCALC" HgbA1c: No results found for: "HGBA1C" Urine Drug Screen:     Component Value Date/Time   LABOPIA NONE DETECTED 04/03/2023 0020   COCAINSCRNUR NONE DETECTED 04/03/2023 0020   LABBENZ POSITIVE (A) 04/03/2023 0020   AMPHETMU NONE DETECTED 04/03/2023 0020   THCU NONE DETECTED 04/03/2023 0020   LABBARB NONE DETECTED 04/03/2023 0020    Alcohol Level     Component Value Date/Time   ETH <10 04/03/2023 0020   INR  Lab Results  Component Value Date   INR 1.1 04/09/2023   APTT No results found for: "APTT"  Impression   Jared Mayer is a 52 y.o. male who presented with fever, confusion, increased tone, elevated CK couple of weeks after increasing his loxapine dose.  There was some delay in recognition given multiple confounding factors (history of Lewy body dementia, intellectual disability, multiple medications).  Following cessation of loxapine, his CKs have improved.  His current worsening mental status could have multiple etiologies including addition of baclofen to other sedating medications, recurrent NMS type symptomatology in the setting of metoclopramide being added, or simply aspiration.  I would favor holding baclofen, and discontinuing  metoclopramide.  Recommendations  Discontinue metoclopramide Continue clonazepam 2 mg every 6 hours Continue bromocriptine 5 mg 3 times daily Continue benztropine 2 mg 3 times daily Infectious workup per IM/CCM Can discontinue baclofen, especially since he has been requiring midodrine to keep his pressure up. Neurology will continue to follow ______________________________________________________________________  This patient is critically ill and at significant risk of neurological worsening, death and care requires constant monitoring of vital signs, hemodynamics,respiratory and cardiac monitoring, neurological assessment, discussion with family, other specialists and medical decision making of high complexity. I spent 40 minutes of neurocritical care time  in the care of  this patient. This was time spent independent of any time provided by nurse practitioner or PA.  Ritta Slot, MD [facility] [phone]  If 7pm- 7am, please page neurology on call as listed in AMION. 05/06/2023  3:23 PM

## 2023-05-06 NOTE — Progress Notes (Signed)
Rapid response called by care nurse because patient was reportedly found extremely short of breath with blood oxygen saturations in the 70s.  Patient was difficult to arouse and was not following commands.  During bedside assessment patient working extremely hard patient placed on a nonrebreather with sats in the low 90s.  Patient was suctioned by RT to help clear secretions.  Patient's condition did not improve after this.  ICU was consulte and patient was transferred to the unit for possible intubation.  Attempted to call patient's legal guardian without no response.\  Zyden Suman Lamin Geradine Girt, MSN, APRN, AGACNP-BC [facility] [place] Pager: [phone]. Check Amion for Availability

## 2023-05-06 NOTE — Consult Note (Signed)
Pharmacy Antibiotic Note  Jared Mayer is a 52 y.o. male admitted on 04/08/2023 with sepsis.  Pharmacy has been consulted for Vancomycin and Zosyn dosing.  Plan: Vancomycin 1750mg  IV x 1, followed by 1250 mg IV Q 12 hrs. Goal AUC 400-550. Expected AUC: 549.8 Expected Cmin: 14.5 SCr used: 0.8(actual 0.71)  Zosyn 3.375g IV Q8 hours(extended infusion)   Height: 5\' 8"  (172.7 cm) Weight: 69.3 kg (152 lb 12.5 oz) IBW/kg (Calculated) : 68.4  Temp (24hrs), Avg:99.7 F (37.6 C), Min:98.4 F (36.9 C), Max:102.2 F (39 C)  Recent Labs  Lab 04/30/23 0552 05/01/23 0451 05/02/23 0357 05/03/23 0629 05/05/23 0714 05/05/23 1954  WBC 7.8 7.5 11.9* 11.2* 8.9 9.4  CREATININE 0.45* 0.44*  --   --  0.53* 0.71  LATICACIDVEN  --   --   --   --   --  1.1    Estimated Creatinine Clearance: 104.5 mL/min (by C-G formula based on SCr of 0.71 mg/dL).    No Known Allergies  Antimicrobials this admission: Unasyn/Ceftriaxone 12/26 x1 Vanc 12/26 >>  Zosyn 12/26 >>  Dose adjustments this admission: N/A  Microbiology results: 12/17 BCx: NGF 12/26: Sputum cx: pending 12/26 MRSA PCR: negative  Thank you for allowing pharmacy to be a part of this patient's care.  Layliana Devins A Toniyah Dilmore 05/06/2023 1:32 PM

## 2023-05-06 NOTE — Progress Notes (Signed)
Daily Progress Note   Patient Name: Jared Mayer       Date: 05/06/2023 DOB: 11-Jul-1970  Age: 52 y.o. MRN#: [id number] Attending Physician: Janann Colonel, MD Primary Care Physician: [facility] Admit Date: 04/08/2023 Length of Stay: 28 days  Reason for Consultation/Follow-up: Establishing goals of care  HPI/Patient Profile:  52 y.o. male  with past medical history of intellectual disability, schizoaffective disorder, drug-induced parkinsonism, HLD and hypothyroidism admitted from group home on 04/08/2023 with AMS, erratic behavior and recurrent falls.   Concern for neuroleptic malignant syndrome-has been started on bromocriptine.   12/22 NGT remains-discussions surrounding possible PEG placement due to high risk for aspiration and high risk for limited PO intake Per neuro recommendations, APAP d/c'd, considering sources of infection due to continued leukocytosis  12/25 - patient decompensated likely d/t aspiration pna and was moved to ICU/intubated.   Palliative medicine was consulted for assisting with goals of care conversations.  Subjective:   Subjective: Chart Reviewed. Updates received. Patient Assessed. Created space and opportunity for patient  and family to explore thoughts and feelings regarding current medical situation.  Today's Discussion: Today I saw the patient at bedside, although he is unable to communicate secondary to intubation/ventilated.  I spoke with patient's nurse and critical care doctor at the bedside.  Reviewed his case and current situation.  He remains on the ventilator, bronchoscopy earlier today found thick secretions that were removed.  He is on Levophed currently at 5 mcg/min and is currently receiving a fluid bolus.  His situation is currently a bit tenuous.  At this point I went on to the emergency room to speak with the patient's mom who is a nurse in the ER.  She is currently working in a 12-hour shift.  She states that she is  going to see her son after her shift at work.  I took some time to update her on his current clinical status.  She again talked about her desire for quality of life and if he cannot have a return to near his baseline quality of life then she would not want extensive things done.  However, there is some hope for improvement from aspiration pneumonia.  We agree that it would require a couple days for outcomes to see how he does.  I told her the palliative medicine team will continue to follow along and assist with these discussions depending on evolution of his clinical picture.  We then had a conversation about CODE STATUS.  She understands that he is quite sick at this time and, with consideration for desire for his quality of life, if he cardiac arrested that CPR would likely not result in survival nor the quality of life she is striving for him to have.  Given this she has agreed to DNR-interventions desired (he is already intubated at this time).  I provided emotional and general support through therapeutic listening, empathy, sharing of stories, therapeutic touch, and other techniques. I answered all questions and addressed all concerns to the best of my ability.  After our conversation reached out to Dr. Sula Soda who is the attending with pulmonary critical care.  I shared that mom is decided to change to DNR and I have put in the order.  I shared the palliative medicine will continue to follow.  Review of Systems  Unable to perform ROS: Mental status change    Objective:   Vital Signs:  BP (!) 99/58   Pulse 99   Temp (!) 102.2 F (39 C) (  Axillary)   Resp (!) 21   Ht 5\' 8"  (1.727 m)   Wt 69.3 kg   SpO2 98%   BMI 23.23 kg/m   Physical Exam Vitals and nursing note reviewed.  Constitutional:      General: He is not in acute distress.    Appearance: He is ill-appearing.     Interventions: He is sedated and intubated.     Comments: Difficult to understand, may have said his name when  asked.  HENT:     Head: Normocephalic and atraumatic.     Mouth/Throat:     Comments: Noted tube feeds on the patient's lips and chin which are wiped away Cardiovascular:     Rate and Rhythm: Normal rate.  Pulmonary:     Effort: Pulmonary effort is normal. No respiratory distress. He is intubated.  Abdominal:     General: Abdomen is flat.     Palpations: Abdomen is soft.  Skin:    General: Skin is warm and dry.     Palliative Assessment/Data: 10%    Existing Vynca/ACP Documentation: None  Assessment & Plan:   Impression: Present on Admission:  Altered mental status  Schizoaffective disorder, bipolar type (HCC)  52 year old male with acute presentation and chronic comorbidities as described above.  The patient is admitted for treatment of likely neuroleptic malignant syndrome.  This is precluding him from having oral intake.  He has an NG tube and there have been ongoing discussions about possible PEG tube placement.  However, the situation became a bit more urgent as it appears his NG tube is not working and he is regurgitating tube feeds.  This is required the team to turn off his feedings and hold oral medications.  Since then he appears to have developed aspiration pneumonia and is in the ICU intubated.  Mother is clear that if he cannot return to a quality of life similar to his baseline then we would need to have further discussions on goals of care. She has agreed to DNR status at this time, time for outcomes in the coming days.  Overall long-term prognosis poor.  SUMMARY OF RECOMMENDATIONS   Changed to DNR-Interventions desired Full scope of care otherwise Time for outcomes, next couple days are critical Ongoing GOC conversations as patient's clinical status evolves Palliative medicine will continue to follow  Symptom Management:  Per primary team PMT is available to assist as needed  Code Status: Full code  Prognosis: Unable to determine  Discharge Planning: To  Be Determined  Discussed with: Patient's family, medical team, nursing team  Thank you for allowing [place] to participate in the care of Jared Mayer PMT will continue to support holistically.  Time Total: 60 min  Detailed review of medical records (labs, imaging, vital signs), medically appropriate exam, discussed with treatment team, counseling and education to patient, family, & staff, documenting clinical information, medication management, coordination of care  Wynne Dust, NP Palliative Medicine Team  Team Phone # [phone] (Nights/Weekends)  01/07/2021, 8:17 AM

## 2023-05-06 NOTE — Progress Notes (Signed)
RT assisted MD with bedside bronchoscopy. Consent and time out were performed prior to procedure by appropriate personnel.   Time Scope into airway: 1353 Time Scope out of airway: 1403  Ambu Scope used for procedure: GUR#[id number]   Bronchial Wash obtained during procedure and taken to lab for analysis.

## 2023-05-06 NOTE — Procedures (Signed)
Bronchoscopy Procedure Note  Jared Mayer  [id number]  1970-06-16  Date:05/06/23  Time:2:42 PM   Provider Performing:Jean-Pierre Kalayla Shadden   Procedure(s):  Flexible Bronchoscopy [phone])  Indication(s) Airway clearance  Consent Risks of the procedure as well as the alternatives and risks of each were explained to the patient and/or caregiver.  Consent for the procedure was obtained and is signed in the bedside chart  Anesthesia Patient already intubated in the ICU  Time Out Verified patient identification, verified procedure, site/side was marked, verified correct patient position, special equipment/implants available, medications/allergies/relevant history reviewed, required imaging and test results available.  Sterile Technique Usual hand hygiene, masks, gowns, and gloves were used  Procedure Description Bronchoscope advanced through endotracheal tube and into airway.  Airways were examined down to subsegmental level with findings noted below.   Following diagnostic evaluation, right bronchial wash was obtained and sent for cultures.   Findings: Extensive secretions noted in all lobes bilaterallyu that were suctioned and cleared. No signs of TEF.   Complications/Tolerance None; patient tolerated the procedure well. Chest X-ray is not needed post procedure.  EBL Minimal  Specimen(s) Bronchial wash sent.    Janann Colonel, MD [facility] 05/06/2023 2:48 PM

## 2023-05-06 NOTE — Progress Notes (Signed)
Attempted to call patient's legal guardian Enis Slipper and other listed emergency contact Jeani Hawking to update on patient's change in clinical status requiring transfer to the ICU. Unable to to leave voicemail at this time. Unable to obtained consent for procedure therefore Intubation and central Line placement was performed emergently  [name], DNP, CCRN, FNP-C, AGACNP-BC Acute Care & Family Nurse Practitioner  [facility]  See Amion for personal pager PCCM on call pager [phone] until 7 am

## 2023-05-06 NOTE — Progress Notes (Signed)
Nutrition Follow-up  DOCUMENTATION CODES:   Not applicable  INTERVENTION:   Once tube feeds initiated, recommend:  Vital 1.5@50ml /hr- Initiate at 51ml/hr and increase by 13ml/hr q8 hours until goal rate is reached.   ProSource TF 20- Give 60ml BID via tube, each supplement provides 80kcal and 20g of protein.   Free water flushes 30ml q4 hours to maintain tube patency   Regimen provides 1960kcal/day, 121g/day protein and 1057ml/day of free water.   Pt at high refeed risk; recommend monitor potassium, magnesium and phosphorus labs daily until stable  Daily weights   NUTRITION DIAGNOSIS:   Inadequate oral intake related to inability to eat as evidenced by NPO status.  GOAL:   Patient will meet greater than or equal to 90% of their needs -not met   MONITOR:   Vent status, Labs, Weight trends, TF tolerance, Skin, I & O's  ASSESSMENT:   52 y/o male with h/o intellectual disability, schizoeffective disorder, bipolar, anxiety, depression, dementia, HLD, chronic constipation, hpothyroidism and drug-induced parkinsons who was admitted from group home on 04/08/2023 with AMS, erratic behavior and recurrent falls and was found to have Oropharyngeal dysphagia, SIRS, neurolept malignant syndrome and rhabdomyolysis.  Pt sedated and ventilated early this morning secondary to respiratory failure. NGT remains in place. Pt received tube feeds from 12/13 to 12/23 without issue. On 12/23 pt developed some regurgitation around the tube and tube feeds were held over concerns for aspiration. KUB from 12/23 was within normal limits. Tube feeds restarted 12/25 but were held again that night for worsening respiratory status and increased secretions. Tube feeds remain on hold today. Spoke with MD, recommended to restart gastric feeds with reglan or send patient to fluoroscopy for post pyloric NGT placement. Plan is for bronchoscopy today and then MD will decide about nutrition. Pt remains at refeed risk.  Per chart, pt is down ~17lbs(10%) since admission if his bed weights are correct; this would be significant. Plan was for G-J tube placement. IR unable to get a good window for placement; surgery consult was recommended.   Medications reviewed and include: colace, lovenox, robinul, synthroid, midodrine, MVI, protonix, lovenox, levophed, zosyn, vancomycin   Labs reviewed: Na 133(L), K 3.8 wnl, BUN 21(H), Mg 2.2 wnl- 12/25 Cbgs- 71, 89, 99 x 24 hrs   Patient is currently intubated on ventilator support MV: 9.0 L/min Temp (24hrs), Avg:99.7 F (37.6 C), Min:98.4 F (36.9 C), Max:102.2 F (39 C)  MAP- >80mmHg   UOP-   Diet Order:   Diet Order             Diet NPO time specified  Diet effective now                  EDUCATION NEEDS:   Not appropriate for education at this time  Skin:  Skin Assessment: Reviewed RN Assessment  Last BM:  12/23- TYPE 6  Height:   Ht Readings from Last 1 Encounters:  05/06/23 5\' 8"  (1.727 m)    Weight:   Wt Readings from Last 1 Encounters:  05/06/23 69.3 kg    Ideal Body Weight:  70 kg  BMI:  Body mass index is 23.23 kg/m.  Estimated Nutritional Needs:   Kcal:  2039kcal/day  Protein:  105-120g/day  Fluid:  1.8-2.1L/day  Betsey Holiday MS, RD, LDN If unable to be reached, please send secure chat to "RD inpatient" available from 8:00a-4:00p daily

## 2023-05-06 NOTE — Progress Notes (Signed)
Pharmacy Antibiotic Note  Jared Mayer is a 52 y.o. male admitted on 04/08/2023 with possible aspiration pneumonia.  Pharmacy has been consulted for Uansyn dosing.  Plan: Unasyn 3 gm q6hr for 5 days per indication & renal fxn.  Pharmacy will continue to follow and will adjust abx dosing whenever warranted.  Temp (24hrs), Avg:98.9 F (37.2 C), Min:97.7 F (36.5 C), Max:100 F (37.8 C)   Recent Labs  Lab 04/29/23 0758 04/30/23 0552 05/01/23 0451 05/02/23 0357 05/03/23 0629 05/05/23 0714 05/05/23 1954  WBC 8.8 7.8 7.5 11.9* 11.2* 8.9 9.4  CREATININE 0.55* 0.45* 0.44*  --   --  0.53* 0.71  LATICACIDVEN  --   --   --   --   --   --  1.1    Estimated Creatinine Clearance: 104.5 mL/min (by C-G formula based on SCr of 0.71 mg/dL).    No Known Allergies  Antimicrobials this admission: 12/26 Unasyn >> x 5 days  Microbiology results: No new lab cx currently ordered or pending at this time.  Thank you for allowing pharmacy to be a part of this patient's care.  Otelia Sergeant, PharmD, [facility] 05/06/2023 3:53 AM

## 2023-05-07 ENCOUNTER — Encounter: Admission: EM | Disposition: E | Payer: Self-pay | Source: Home / Self Care | Attending: Internal Medicine

## 2023-05-07 LAB — COMPREHENSIVE METABOLIC PANEL
ALT: 38 U/L (ref 0–44)
AST: 34 U/L (ref 15–41)
Albumin: 2.3 g/dL — ABNORMAL LOW (ref 3.5–5.0)
Alkaline Phosphatase: 82 U/L (ref 38–126)
Anion gap: 10 (ref 5–15)
BUN: 23 mg/dL — ABNORMAL HIGH (ref 6–20)
CO2: 24 mmol/L (ref 22–32)
Calcium: 8.3 mg/dL — ABNORMAL LOW (ref 8.9–10.3)
Chloride: 101 mmol/L (ref 98–111)
Creatinine, Ser: 0.69 mg/dL (ref 0.61–1.24)
GFR, Estimated: >60 mL/min (ref >60)
Glucose, Bld: 112 mg/dL — ABNORMAL HIGH (ref 70–99)
Potassium: 3.6 mmol/L (ref 3.5–5.1)
Sodium: 135 mmol/L (ref 135–145)
Total Bilirubin: 0.8 mg/dL (ref <1.2)
Total Protein: 6.2 g/dL — ABNORMAL LOW (ref 6.5–8.1)

## 2023-05-07 LAB — CBC
HCT: 32.2 % — ABNORMAL LOW (ref 39.0–52.0)
Hemoglobin: 10.8 g/dL — ABNORMAL LOW (ref 13.0–17.0)
MCH: 32.4 pg (ref 26.0–34.0)
MCHC: 33.5 g/dL (ref 30.0–36.0)
MCV: 96.7 fL (ref 80.0–100.0)
Platelets: 446 10*3/uL — ABNORMAL HIGH (ref 150–400)
RBC: 3.33 MIL/uL — ABNORMAL LOW (ref 4.22–5.81)
RDW: 14.3 % (ref 11.5–15.5)
WBC: 15.7 10*3/uL — ABNORMAL HIGH (ref 4.0–10.5)
nRBC: 0 % (ref 0.0–0.2)

## 2023-05-07 LAB — GLUCOSE, CAPILLARY
Glucose-Capillary: 103 mg/dL — ABNORMAL HIGH (ref 70–99)
Glucose-Capillary: 104 mg/dL — ABNORMAL HIGH (ref 70–99)
Glucose-Capillary: 83 mg/dL (ref 70–99)
Glucose-Capillary: 87 mg/dL (ref 70–99)
Glucose-Capillary: 87 mg/dL (ref 70–99)
Glucose-Capillary: 91 mg/dL (ref 70–99)
Glucose-Capillary: 98 mg/dL (ref 70–99)

## 2023-05-07 LAB — MAGNESIUM: Magnesium: 2 mg/dL (ref 1.7–2.4)

## 2023-05-07 LAB — PHOSPHORUS: Phosphorus: 2.3 mg/dL — ABNORMAL LOW (ref 2.5–4.6)

## 2023-05-07 LAB — TRIGLYCERIDES: Triglycerides: 74 mg/dL (ref <150)

## 2023-05-07 SURGERY — INSERTION OF GASTROSTOMY TUBE
Anesthesia: Choice

## 2023-05-07 MED ORDER — VITAL 1.5 CAL PO LIQD
1000.0000 mL | ORAL | Status: DC
  Administered 2023-05-07 – 2023-05-10 (×3): 1000 mL

## 2023-05-07 MED ORDER — LACTATED RINGERS IV BOLUS
250.0000 mL | Freq: Once | INTRAVENOUS | Status: AC
  Administered 2023-05-07: 250 mL via INTRAVENOUS

## 2023-05-07 MED ORDER — LACTATED RINGERS IV SOLN: INTRAVENOUS | Status: AC

## 2023-05-07 MED ORDER — LACTATED RINGERS IV BOLUS
500.0000 mL | Freq: Once | INTRAVENOUS | Status: AC
  Administered 2023-05-07: 500 mL via INTRAVENOUS

## 2023-05-07 MED ORDER — ACETAMINOPHEN 10 MG/ML IV SOLN
1000.0000 mg | Freq: Once | INTRAVENOUS | Status: AC
  Administered 2023-05-07: 1000 mg via INTRAVENOUS
  Filled 2023-05-07: qty 100

## 2023-05-07 MED ORDER — METRONIDAZOLE 500 MG/100ML IV SOLN
500.0000 mg | Freq: Two times a day (BID) | INTRAVENOUS | Status: DC
  Administered 2023-05-07 (×2): 500 mg via INTRAVENOUS
  Filled 2023-05-07 (×3): qty 100

## 2023-05-07 NOTE — Progress Notes (Signed)
Patient's urine output for this shift so far is at 13mL/hr with this hour having 0mL urinary output.  Dr. Larinda Buttery and NP notified.

## 2023-05-07 NOTE — Significant Event (Signed)
Rapid Response Event Note   Reason for Call :  Decreased responsiveness, increased work of breathing, desaturation, tachycardic  Initial Focused Assessment:  Upon entering room, patient is more lethargic than the previous rapid response called on him. His work of breathing is more severe, he is tachypneic and tachycardic. There is concern of the whole team that he will tire soon and concern for aspiration. Feeds vis NG continue to be held.  NP suction done with yellow/tan secretion removal, did help the patient breathe more effectively for a short time.   Interventions:  NP suction, consult CCMD, transfer to ICU for intubation  Plan of Care:  Patient was transferred to the ICU, intubated with no difficulty and later had a central line placed due to very difficult stick for phlebotomy and limited IV access  Event Summary:   MD Notified: Randye Lobo NP Call Time: 4696 Arrival Time: 2952 End Time: 0330 with arrival in ICU  Astrid Drafts, RN

## 2023-05-07 NOTE — Progress Notes (Addendum)
NEUROLOGY CONSULT FOLLOW UP NOTE   Date of service: May 07, 2023 Patient Name: Jared [facility] MRN:  [id number] DOB:  [birth year]  Brief HPI  Jared [facility] is a 52 y.o. male who presented with  past medical history significant for possible Lewy body dementia with behavioral disturbance, baseline moderate intellectual disability, mood disorder (schizoaffective disorder bipolar type), prior presentations for unintentional lamotrigine overdose, chronic hypotension on midodrine, hyperlipidemia, drug-induced parkinsonism, chronic right-sided weakness.   He had been haivng increasing problems with aggression and on 11/22 his loxapin was increased(40 for a 14 day supply). HBis CK was noted to be elevated and was attributed to rhabdomyolysis.   He was noted to have some disorientation and gait instability on 11/28 as well as significant aggression. He was found to be febrile at that time and started on abx. His loxapine was continued until 12/11.  His CK peaked on 12/10 at 1500.   On 12/11, psychiatry was reconsulted due to significant worsening with contorted face, rigid extremities. It was at this time that NMS was considered and loxapine was stopped.   Due to poor GI motility, he was started on metoclopramide 10mg  Q8H on 12/23. It was noted tha the continued to have increased tone yesterday and therefore baclofen 5mg  BID was added.     Interval Hx/subjective   Little more awake today Vitals   Vitals:   05/07/23 1230 05/07/23 1300 05/07/23 1330 05/07/23 1400  BP: 121/81 130/79 137/81 (!) 142/84  Pulse: (!) 54 (!) 51 (!) 55 (!) 59  Resp: 20 20 20 20   Temp: 99.5 F (37.5 C) 99.5 F (37.5 C)  99.5 F (37.5 C)  TempSrc: Rectal Rectal  Rectal  SpO2: 98% 100% 99% 100%  Weight:      Height:         Body mass index is 24.7 kg/m.  Physical Exam   General: in bed, intubated.   Neurologic Examination   MS: awakens to voice, follows commands to wiggle thumbs and wiggle toes  bilaterally CN: crosses midline in both directions, fixates and tracks Motor: His tone is not diffusely increased, but he doe shave bilateral fott flexion and wirst flexion.. More proximally, he does not appear to have increased tone Sensory: responds to nox stim x 4.   Labs and Diagnostic Imaging   CBC:  Recent Labs  Lab 05/05/23 1954 05/07/23 0417  WBC 9.4 15.7*  HGB 12.4* 10.8*  HCT 36.2* 32.2*  MCV 94.0 96.7  PLT 538* 446*    Basic Metabolic Panel:  Lab Results  Component Value Date   NA 135 05/07/2023   K 3.6 05/07/2023   CO2 24 05/07/2023   GLUCOSE 112 (H) 05/07/2023   BUN 23 (H) 05/07/2023   CREATININE 0.69 05/07/2023   CALCIUM 8.3 (L) 05/07/2023   GFRNONAA >60 05/07/2023   GFRAA >60 04/01/2018   Lipid Panel: No results found for: "LDLCALC" HgbA1c: No results found for: "HGBA1C" Urine Drug Screen:     Component Value Date/Time   LABOPIA NONE DETECTED 04/03/2023 0020   COCAINSCRNUR NONE DETECTED 04/03/2023 0020   LABBENZ POSITIVE (A) 04/03/2023 0020   AMPHETMU NONE DETECTED 04/03/2023 0020   THCU NONE DETECTED 04/03/2023 0020   LABBARB NONE DETECTED 04/03/2023 0020    Alcohol Level     Component Value Date/Time   ETH <10 04/03/2023 0020   INR  Lab Results  Component Value Date   INR 1.1 04/09/2023   APTT No results found for: "APTT"  Impression  Jared [facility] is a 52 y.o. male who presented with fever, confusion, increased tone, elevated CK couple of weeks after increasing his loxapine dose.  There was some delay in recognition given multiple confounding factors (history of Lewy body dementia, intellectual disability, multiple medications).  Following cessation of loxapine, his CKs have improved.  His current worsening mental status could have multiple etiologies including addition of baclofen to other sedating medications, recurrent NMS type symptomatology in the setting of metoclopramide being added, or simply aspiration.  He appears slightly better  than yesterday.  Recommendations  Continue clonazepam 2 mg every 6 hours Continue bromocriptine 5 mg 3 times daily Continue benztropine 2 mg 3 times daily Infectious workup per IM/CCM Neurology will continue to follow ______________________________________________________________________   Ritta Slot, MD [facility] [phone]  If 7pm- 7am, please page neurology on call as listed in AMION. 05/07/2023  3:08 PM

## 2023-05-07 NOTE — Progress Notes (Signed)
Visited with Mr. Jared Mayer at his bedside. He remains intubated and sedated, no family at bedside during time of visit. Called and spoke with Jared Mayer-mother, plan set to meet at bedside 12/28 afternoon.  No Charge.  Jared Deed, DNP, AGNP-C Palliative Medicine  Please call Palliative Medicine team phone with any questions [phone]. For individual providers please see AMION.

## 2023-05-07 NOTE — Progress Notes (Signed)
NAME:  Jared [facility], MRN:  [id number], DOB:  1970-09-18, LOS: 29 ADMISSION DATE:  04/08/2023  History of Present Illness:  This is a case of a 52 year old male patient with a past medical history of Lewy body dementia with behavioral disturbances, baseline moderate intellectual disability, mood disorder, prior presentation for an intentional lamotrigine overdose, chronic hypotension on midodrine, drug-induced parkinsonism who presented initially to [facility] with increased combative behavior from assisted living.  His medications were adjusted and he was discharged back to assisted living.  However on 11/28 a day after discharge he presents back with gait instability disorientation and repetitive speech pattern as well as erratic behavior.  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  Therefore his psychotropic medications were stopped.  Unfortunately course complicated by acute hypoxic respiratory failure overnight requiring intubation and mechanical ventilation.  Thought to be secondary to an aspiration event.  The only changes in medications was starting baclofen yesterday.  He has been on benztropine, clonazepam and bromocriptine.  Chest x-ray shows high endotracheal tube that was advanced by 2 cm, left IJ in place.  No significant parenchymal lung disease.  OG tube in place.  Started on ceftriaxone for aspiration pneumonia.   Pertinent  Medical History  -- Bipolar disorder schizoaffective features has been on significant mood stabilizers and antipsychotics including lamotrigine, topiramate, clonazepam, paroxetine, valproic acid, Ingrezza, loxapine, olanzapine.  Currently on benztropine clonazepam and bromocriptine  Significant Hospital Events: Including procedures, antibiotic start and stop dates in addition to other pertinent events   05/06/2023 intubated and mechanically ventilated for  hypoxic respiratory failure secondary to aspiration event. 05/07/2023 Remains intubated, barely following commands. S/p bronchoscopy 12/26 culture negative so far. DNR.   Interim History / Subjective:  On encounter this morning able to follow minimal commands has significant secretions through the ET tube.  Objective   Blood pressure 126/82, pulse 61, temperature (!) 101.7 F (38.7 C), temperature source Rectal, resp. rate 20, height 5\' 8"  (1.727 m), weight 73.7 kg, SpO2 99%.    Vent Mode: PRVC FiO2 (%):  [24 %-30 %] 24 % Set Rate:  [20 bmp] 20 bmp Vt Set:  [500 mL] 500 mL PEEP:  [5 cmH20] 5 cmH20 Plateau Pressure:  [12 cmH20-16 cmH20] 15 cmH20   Intake/Output Summary (Last 24 hours) at 05/07/2023 0919 Last data filed at 05/07/2023 0600 Gross per 24 hour  Intake 2654.86 ml  Output 1230 ml  Net 1424.86 ml   Filed Weights   05/05/23 0500 05/06/23 0335 05/07/23 0426  Weight: 68.5 kg 69.3 kg 73.7 kg    Examination: General: Intubated sedated able to follow minimal commands HENT: Supple neck reactive pupils Lungs: Diffuse rhonchi heard bilaterally Cardiovascular: Normal S1, normal S2, regular rate and rhythm Abdomen: Soft nontender nondistended positive bowel sounds Extremities: Warm well-perfused no edema  WBC slightly increased now at 15.7. Plt downtrending at 446. Kidney function intact. MRSA -. No organisms in BAL so far.    Assessment & Plan:  Case of a 52 year old male patient with a past medical history of bipolar disorder, Lewy body dementia with behavioral disturbances and significant psychotropic medications was on the floor since 11/28 with neuroleptic malignant syndrome now course complicated by acute hypoxic respiratory failure requiring intubation and mechanical ventilation now on 12/26 for aspiration pneumonia.  # Acute hypoxic respiratory failure secondary to... 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  CNS: Precedex for RASS 0 to -1. Fentanyl PRN for CPOT > 2. C.w Benztropine, Clonazepam and bromocriptine.  CVS: NE MAPs> 65. LR 500 cc once.  Pulm: MRSA swab neg. D/c Vanc c/w zosyn.  GI: Retrial Tube feeds. PPI for prophylaxis.  Renal: Monitor uop.  Heme: Lovenox for DVT px.  Endo: POC 140-180  Best Practice (right click and "Reselect all SmartList Selections" daily)   Diet/type: Restart tube feeds.  DVT prophylaxis prophylactic heparin  Pressure ulcer(s): N/A GI prophylaxis: PPI Lines: N/A Foley:  Yes, and it is still needed Code Status:  DNR (Palliative Team discussed with family members)   Last date of multidisciplinary goals of care discussion [05/07/2023]  I spent 55 minutes caring for this patient today, including preparing to see the patient, obtaining a medical history , reviewing a separately obtained history, performing a medically appropriate examination and/or evaluation, counseling and educating the patient/family/caregiver, ordering medications, tests, or procedures, documenting clinical information in the electronic health record, and independently interpreting results (not separately reported/billed) and communicating results to the patient/family/caregiver  Jared Colonel, MD [facility] 05/07/2023 9:19 AM

## 2023-05-07 NOTE — Plan of Care (Signed)
Continuing with plan of care. 

## 2023-05-07 NOTE — Plan of Care (Signed)

## 2023-05-08 ENCOUNTER — Inpatient Hospital Stay: Payer: Medicare HMO

## 2023-05-08 DIAGNOSIS — Z515 Encounter for palliative care: Secondary | ICD-10-CM | POA: Diagnosis not present

## 2023-05-08 DIAGNOSIS — R4182 Altered mental status, unspecified: Secondary | ICD-10-CM | POA: Diagnosis not present

## 2023-05-08 DIAGNOSIS — J9601 Acute respiratory failure with hypoxia: Secondary | ICD-10-CM | POA: Diagnosis not present

## 2023-05-08 DIAGNOSIS — F250 Schizoaffective disorder, bipolar type: Secondary | ICD-10-CM | POA: Diagnosis not present

## 2023-05-08 LAB — CBC
HCT: 31.1 % — ABNORMAL LOW (ref 39.0–52.0)
Hemoglobin: 10.3 g/dL — ABNORMAL LOW (ref 13.0–17.0)
MCH: 32.2 pg (ref 26.0–34.0)
MCHC: 33.1 g/dL (ref 30.0–36.0)
MCV: 97.2 fL (ref 80.0–100.0)
Platelets: 403 10*3/uL — ABNORMAL HIGH (ref 150–400)
RBC: 3.2 MIL/uL — ABNORMAL LOW (ref 4.22–5.81)
RDW: 14.2 % (ref 11.5–15.5)
WBC: 9.9 10*3/uL (ref 4.0–10.5)
nRBC: 0 % (ref 0.0–0.2)

## 2023-05-08 LAB — COMPREHENSIVE METABOLIC PANEL
ALT: 29 U/L (ref 0–44)
AST: 24 U/L (ref 15–41)
Albumin: 1.9 g/dL — ABNORMAL LOW (ref 3.5–5.0)
Alkaline Phosphatase: 79 U/L (ref 38–126)
Anion gap: 8 (ref 5–15)
BUN: 23 mg/dL — ABNORMAL HIGH (ref 6–20)
CO2: 20 mmol/L — ABNORMAL LOW (ref 22–32)
Calcium: 7.6 mg/dL — ABNORMAL LOW (ref 8.9–10.3)
Chloride: 105 mmol/L (ref 98–111)
Creatinine, Ser: 0.45 mg/dL — ABNORMAL LOW (ref 0.61–1.24)
GFR, Estimated: >60 mL/min (ref >60)
Glucose, Bld: 101 mg/dL — ABNORMAL HIGH (ref 70–99)
Potassium: 3 mmol/L — ABNORMAL LOW (ref 3.5–5.1)
Sodium: 133 mmol/L — ABNORMAL LOW (ref 135–145)
Total Bilirubin: 0.5 mg/dL (ref <1.2)
Total Protein: 5.6 g/dL — ABNORMAL LOW (ref 6.5–8.1)

## 2023-05-08 LAB — GLUCOSE, CAPILLARY
Glucose-Capillary: 104 mg/dL — ABNORMAL HIGH (ref 70–99)
Glucose-Capillary: 105 mg/dL — ABNORMAL HIGH (ref 70–99)
Glucose-Capillary: 46 mg/dL — ABNORMAL LOW (ref 70–99)
Glucose-Capillary: 53 mg/dL — ABNORMAL LOW (ref 70–99)
Glucose-Capillary: 73 mg/dL (ref 70–99)
Glucose-Capillary: 79 mg/dL (ref 70–99)
Glucose-Capillary: 83 mg/dL (ref 70–99)
Glucose-Capillary: 96 mg/dL (ref 70–99)

## 2023-05-08 LAB — MAGNESIUM: Magnesium: 1.7 mg/dL (ref 1.7–2.4)

## 2023-05-08 LAB — PHOSPHORUS: Phosphorus: 3.1 mg/dL (ref 2.5–4.6)

## 2023-05-08 MED ORDER — BROMOCRIPTINE MESYLATE 2.5 MG PO TABS
5.0000 mg | ORAL_TABLET | Freq: Four times a day (QID) | ORAL | Status: DC
  Administered 2023-05-08 – 2023-05-09 (×4): 5 mg via NASOGASTRIC
  Filled 2023-05-08 (×5): qty 2

## 2023-05-08 MED ORDER — LACTATED RINGERS IV BOLUS
500.0000 mL | Freq: Once | INTRAVENOUS | Status: AC
  Administered 2023-05-08: 500 mL via INTRAVENOUS

## 2023-05-08 MED ORDER — MIDAZOLAM HCL 2 MG/2ML IJ SOLN
4.0000 mg | Freq: Once | INTRAMUSCULAR | Status: AC
  Administered 2023-05-08: 4 mg via INTRAVENOUS

## 2023-05-08 MED ORDER — MIDAZOLAM HCL 2 MG/2ML IJ SOLN
INTRAMUSCULAR | Status: AC
  Filled 2023-05-08: qty 4

## 2023-05-08 MED ORDER — PROPOFOL 1000 MG/100ML IV EMUL
5.0000 ug/kg/min | INTRAVENOUS | Status: DC
Start: 2023-05-08 — End: 2023-05-11
  Administered 2023-05-08: 50 ug/kg/min via INTRAVENOUS
  Administered 2023-05-09 (×2): 30 ug/kg/min via INTRAVENOUS
  Administered 2023-05-09: 40 ug/kg/min via INTRAVENOUS
  Administered 2023-05-10: 30 ug/kg/min via INTRAVENOUS
  Administered 2023-05-11: 15 ug/kg/min via INTRAVENOUS
  Filled 2023-05-08 (×9): qty 100

## 2023-05-08 MED ORDER — DEXTROSE 50 % IV SOLN
INTRAVENOUS | Status: AC
  Filled 2023-05-08: qty 50

## 2023-05-08 MED ORDER — MIDAZOLAM HCL 2 MG/2ML IJ SOLN
2.0000 mg | INTRAMUSCULAR | Status: DC | PRN
  Administered 2023-05-08: 2 mg via INTRAVENOUS
  Filled 2023-05-08: qty 2

## 2023-05-08 MED ORDER — DEXTROSE 50 % IV SOLN
12.5000 g | INTRAVENOUS | Status: AC
  Administered 2023-05-08: 12.5 g via INTRAVENOUS

## 2023-05-08 MED ORDER — MAGNESIUM SULFATE 2 GM/50ML IV SOLN
2.0000 g | Freq: Once | INTRAVENOUS | Status: AC
  Administered 2023-05-08: 2 g via INTRAVENOUS
  Filled 2023-05-08: qty 50

## 2023-05-08 MED ORDER — POTASSIUM CHLORIDE 10 MEQ/50ML IV SOLN
10.0000 meq | INTRAVENOUS | Status: AC
  Administered 2023-05-08 (×4): 10 meq via INTRAVENOUS
  Filled 2023-05-08 (×4): qty 50

## 2023-05-08 MED ORDER — PROPOFOL 1000 MG/100ML IV EMUL
INTRAVENOUS | Status: AC
  Administered 2023-05-08: 20 ug/kg/min via INTRAVENOUS
  Filled 2023-05-08: qty 100

## 2023-05-08 MED ORDER — SODIUM CHLORIDE 0.9 % IV SOLN
1.0000 g | INTRAVENOUS | Status: DC
  Administered 2023-05-08: 1 g via INTRAVENOUS
  Filled 2023-05-08 (×2): qty 10

## 2023-05-08 MED ORDER — POTASSIUM CHLORIDE 20 MEQ PO PACK
40.0000 meq | PACK | Freq: Once | ORAL | Status: AC
  Administered 2023-05-08: 40 meq
  Filled 2023-05-08: qty 2

## 2023-05-08 NOTE — Progress Notes (Signed)
NEUROLOGY CONSULT FOLLOW UP NOTE   Date of service: May 08, 2023 Patient Name: Jared Mayer MRN:  [id number] DOB:  23-Dec-1970  Brief HPI  Jared Mayer is a 52 y.o. male who presented with  past medical history significant for possible Lewy body dementia with behavioral disturbance, baseline moderate intellectual disability, mood disorder (schizoaffective disorder bipolar type), prior presentations for unintentional lamotrigine overdose, chronic hypotension on midodrine, hyperlipidemia, drug-induced parkinsonism, chronic right-sided weakness.   He had been haivng increasing problems with aggression and on 11/22 his loxapin was increased(40 for a 14 day supply). HBis CK was noted to be elevated and was attributed to rhabdomyolysis.   He was noted to have some disorientation and gait instability on 11/28 as well as significant aggression. He was found to be febrile at that time and started on abx. His loxapine was continued until 12/11.  His CK peaked on 12/10 at 1500.   On 12/11, psychiatry was reconsulted due to significant worsening with contorted face, rigid extremities. It was at this time that NMS was considered and loxapine was stopped.   Due to poor GI motility, he was started on metoclopramide 10mg  Q8H on 12/23. It was noted that the continued to have increased tone yesterday and therefore baclofen 5mg  BID was added.     Interval Hx/subjective   Continues to be more awake  Vitals   Vitals:   05/08/23 0545 05/08/23 0600 05/08/23 0700 05/08/23 0703  BP: (!) 147/83 (!) 140/84 (!) 143/83   Pulse: (!) 42 (!) 40 (!) 42 (!) 43  Resp: 20 20 20 19   Temp:      TempSrc:      SpO2: 100% 100% 100% 100%  Weight:      Height:         Body mass index is 25.27 kg/m.  Physical Exam   General: in bed, intubated.   Neurologic Examination   MS: awakens to voice, follows commands to wiggle thumbs and wiggle toes bilaterally CN: crosses midline in both directions, fixates and  tracks Motor: His tone is not diffusely increased, but he does have bilateral foot flexion and wrist flexion.  The distribution is definitely flexor more than extensor hypertonicity.  He is able to follow commands in all four extremities Sensory: responds to nox stim x 4.   Labs and Diagnostic Imaging   CBC:  Recent Labs  Lab 05/07/23 0417 05/08/23 0358  WBC 15.7* 9.9  HGB 10.8* 10.3*  HCT 32.2* 31.1*  MCV 96.7 97.2  PLT 446* 403*    Basic Metabolic Panel:  Lab Results  Component Value Date   NA 133 (L) 05/08/2023   K 3.0 (L) 05/08/2023   CO2 20 (L) 05/08/2023   GLUCOSE 101 (H) 05/08/2023   BUN 23 (H) 05/08/2023   CREATININE 0.45 (L) 05/08/2023   CALCIUM 7.6 (L) 05/08/2023   GFRNONAA >60 05/08/2023   GFRAA >60 04/01/2018   Lipid Panel: No results found for: "LDLCALC" HgbA1c: No results found for: "HGBA1C" Urine Drug Screen:     Component Value Date/Time   LABOPIA NONE DETECTED 04/03/2023 0020   COCAINSCRNUR NONE DETECTED 04/03/2023 0020   LABBENZ POSITIVE (A) 04/03/2023 0020   AMPHETMU NONE DETECTED 04/03/2023 0020   THCU NONE DETECTED 04/03/2023 0020   LABBARB NONE DETECTED 04/03/2023 0020    Alcohol Level     Component Value Date/Time   ETH <10 04/03/2023 0020   INR  Lab Results  Component Value Date   INR 1.1 04/09/2023  APTT No results found for: "APTT"  Impression   Jared Mayer is a 52 y.o. male who presented with fever, confusion, increased tone, elevated CK couple of weeks after increasing his loxapine dose.  There was some delay in recognition given multiple confounding factors (history of Lewy body dementia, intellectual disability, multiple medications).  Following cessation of loxapine, his CKs have improved.  His worsening mental status on 12/26 could have multiple etiologies including addition of baclofen to other sedating medications, recurrent NMS type symptomatology in the setting of metoclopramide being added, or simply aspiration.  He  appears slightly better.  With the duration of symptoms at this point, I do think looking for other etiologies would be prudent, though NMS is still highest on my differential.  He did have a few episodes of hypotension and therefore looking for watershed infarcts with MRI as well as looking for cervical pathology I think would be prudent.  Recommendations  Continue clonazepam 2 mg every 6 hours Increase bromocriptine to 5 mg q6h Continue benztropine 2 mg 3 times daily MRI brain and C-spine Infectious workup per IM/CCM Neurology will continue to follow ______________________________________________________________________   Ritta Slot, MD [facility] [phone]  If 7pm- 7am, please page neurology on call as listed in AMION. 05/08/2023  10:39 AM

## 2023-05-08 NOTE — Plan of Care (Signed)
Continuing with plan of care. 

## 2023-05-08 NOTE — Progress Notes (Signed)
NAME:  Jared Mayer, MRN:  [id number], DOB:  1970-11-18, LOS: 30 ADMISSION DATE:  04/08/2023  History of Present Illness:  This is a case of a 52 year old male patient with a past medical history of Lewy body dementia with behavioral disturbances, baseline moderate intellectual disability, mood disorder, prior presentation for an intentional lamotrigine overdose, chronic hypotension on midodrine, drug-induced parkinsonism who presented initially to [facility] with increased combative behavior from assisted living.  His medications were adjusted and he was discharged back to assisted living.  However on 11/28 a day after discharge he presents back with gait instability disorientation and repetitive speech pattern as well as erratic behavior.  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  Therefore his psychotropic medications were stopped.  Unfortunately course complicated by acute hypoxic respiratory failure overnight requiring intubation and mechanical ventilation.  Thought to be secondary to an aspiration event.  The only changes in medications was starting baclofen yesterday.  He has been on benztropine, clonazepam and bromocriptine.  Chest x-ray shows high endotracheal tube that was advanced by 2 cm, left IJ in place.  No significant parenchymal lung disease.  OG tube in place.  Started on ceftriaxone for aspiration pneumonia.   Pertinent  Medical History  -- Bipolar disorder schizoaffective features has been on significant mood stabilizers and antipsychotics including lamotrigine, topiramate, clonazepam, paroxetine, valproic acid, Ingrezza, loxapine, olanzapine.  Currently on benztropine clonazepam and bromocriptine  Significant Hospital Events: Including procedures, antibiotic start and stop dates in addition to other pertinent events   05/06/2023 intubated and mechanically ventilated for  hypoxic respiratory failure secondary to aspiration event. 05/07/2023 Remains intubated, barely following commands. S/p bronchoscopy 12/26 culture negative so far. DNR.   Interim History / Subjective:  Patient was seen and evaluated by the ICU team.  He had spiked fevers yesterday around 100.9. Required cooling blanket for a period of time. Off this am.  Has stiffness in the upper extremities.  WBC trending down, platelets trending down.   Objective   Blood pressure (!) 143/83, pulse (!) 43, temperature (!) 100.8 F (38.2 C), temperature source Rectal, resp. rate 19, height 5\' 8"  (1.727 m), weight 75.4 kg, SpO2 100%.    Vent Mode: PRVC FiO2 (%):  [24 %] 24 % Set Rate:  [20 bmp] 20 bmp Vt Set:  [500 mL] 500 mL PEEP:  [5 cmH20] 5 cmH20 Plateau Pressure:  [16 cmH20-22 cmH20] 20 cmH20   Intake/Output Summary (Last 24 hours) at 05/08/2023 1154 Last data filed at 05/08/2023 0700 Gross per 24 hour  Intake 3376.26 ml  Output 440 ml  Net 2936.26 ml   Filed Weights   05/06/23 0335 05/07/23 0426 05/08/23 0500  Weight: 69.3 kg 73.7 kg 75.4 kg    Examination: General: Intubated sedated able to follow minimal commands HENT: Supple neck reactive pupils Lungs: Diffuse rhonchi heard bilaterally Cardiovascular: Normal S1, normal S2, regular rate and rhythm Abdomen: Soft nontender nondistended positive bowel sounds Extremities: Warm well-perfused no edema  WBC slightly increased now at 15.7. Plt downtrending at 446. Kidney function intact. MRSA -. No organisms in BAL so far.    Assessment & Plan:  Case of a 52 year old male patient with a past medical history of bipolar disorder, Lewy body dementia with behavioral disturbances and significant psychotropic medications was on the floor since 11/28 with neuroleptic malignant syndrome now course complicated by acute hypoxic respiratory failure requiring intubation and mechanical ventilation now on  12/26 for aspiration pneumonia.  # Acute  hypoxic respiratory failure secondary to... 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  CNS: D/c Precedex for fevers. C/w Fentanyl drip and versed as needed. C/w Benztropine, Clonazepam and bromocriptine (Increasing today per neurology).  CVS: NE MAPs> 65. LR 500 cc once today.  Pulm: MRSA swab neg. D/c Vanc c/w zosyn. C/w Ceftriaxone for a total of 5 days.  GI: c/w Tube feeds. Seems to be tolerated well. Will schedule daily miralax. PPI for prophylaxis.  Renal: Monitor uop.  Heme: Lovenox for DVT px.  Endo: POC 140-180  Best Practice (right click and "Reselect all SmartList Selections" daily)   Diet/type: Restart tube feeds.  DVT prophylaxis prophylactic heparin  Pressure ulcer(s): N/A GI prophylaxis: PPI Lines: N/A Foley:  Yes, and it is still needed Code Status:  DNR (Palliative Team discussed with family members)   On going discussion with family members regarding QOL and care moving forward.   Last date of multidisciplinary goals of care discussion [05/08/2023]  I spent 50 minutes caring for this patient today, including preparing to see the patient, obtaining a medical history , reviewing a separately obtained history, performing a medically appropriate examination and/or evaluation, counseling and educating the patient/family/caregiver, ordering medications, tests, or procedures, documenting clinical information in the electronic health record, and independently interpreting results (not separately reported/billed) and communicating results to the patient/family/caregiver  Janann Colonel, MD [facility] 05/08/2023 11:54 AM

## 2023-05-08 NOTE — Progress Notes (Signed)
PHARMACY CONSULT NOTE - FOLLOW UP  Pharmacy Consult for Electrolyte Monitoring and Replacement   Recent Labs: Potassium (mmol/L)  Date Value  05/08/2023 3.0 (L)   Magnesium (mg/dL)  Date Value  03/07/2535 1.7   Calcium (mg/dL)  Date Value  64/40/3474 7.6 (L)   Albumin (g/dL)  Date Value  25/95/6387 1.9 (L)   Phosphorus (mg/dL)  Date Value  56/43/3295 3.1   Sodium (mmol/L)  Date Value  05/08/2023 133 (L)     Assessment: 52 y/o male with h/o intellectual disability, schizoeffective disorder, bipolar, anxiety, depression, dementia, HLD, chronic constipation, hpothyroidism and drug-induced parkinsons who was admitted from group home on 04/08/2023 with AMS, erratic behavior and recurrent falls and was found to have Oropharyngeal dysphagia, SIRS, neurolept malignant syndrome and rhabdomyolysis. Pharmacy is asked to follow and replace electrolytes while in CCU  Goal of Therapy:  Electrolytes WNL  Plan:  ---2 grams IV magnesium sulfate x 1 ---40 mEq KCl per tube x 2 ---recheck electrolytes in am  Lowella Bandy ,PharmD Clinical Pharmacist 05/08/2023 10:13 AM

## 2023-05-08 NOTE — Progress Notes (Addendum)
Throughout the shift, to this point, patient has been agitated to the point of multiple episodes of biting his ETT tube, despite administration of ordered medications within ordered parameters.  Patient had an episode of desaturation to the mid-60s, rhythmic tremors, dusky to the face in color, and blue lips.  Dr. Trecia Rogers to bedside and ordered 4mg  Versed IV push.  Patient unrelieved of agitation post versed and Dr. Trecia Rogers notified. Dr. Amada Jupiter notified of episode that involved rhythmic tremors and ordered ceribell monitoring and for administration of ordered medications to keep patient sedated and not biting his ETT tube resulting in desaturation episodes.

## 2023-05-08 NOTE — Progress Notes (Signed)
Daily Progress Note   Patient Name: Jared Mayer       Date: 05/08/2023 DOB: 09-Mar-1971  Age: 52 y.o. MRN#: [id number] Attending Physician: Janann Colonel, MD Primary Care Physician: [facility] Admit Date: 04/08/2023  Reason for Consultation/Follow-up: Establishing goals of care  HPI/Brief Hospital Review: 52 y.o. male  with past medical history of intellectual disability, schizoaffective disorder, drug-induced parkinsonism, HLD and hypothyroidism admitted from group home on 04/08/2023 with AMS, erratic behavior and recurrent falls.   Concern for neuroleptic malignant syndrome-has been started on bromocriptine.   12/22 NGT remains-discussions surrounding possible PEG placement due to high risk for aspiration and high risk for limited PO intake Per neuro recommendations, APAP d/c'd, considering sources of infection due to continued leukocytosis  Unfortunately developed acute respiratory failure requiring mechanical ventilation likely secondary to aspiration pneumonia on 12/26  12/28 remains on mechanical ventilation, low dose Fentanyl and low dose vasopressor support  Palliative Medicine consulted for assisting with goals of care conversations.  Subjective: Extensive chart review has been completed prior to meeting patient including labs, vital signs, imaging, progress notes, orders, and available advanced directive documents from current and previous encounters.    Visited with Jared Mayer at his bedside. He remains intubated and on low dose Fentanyl, continues to require low dose vasopressor support. His eyes are open, he attempts to communicate. No family at bedside during time of initial visit, plan to visit with mother this afternoon.  Concern remains for poor  gastric motility, may need to consider tube placement.  Later met at bedside with mother-Jared Mayer. Provided medical updates. [name] continues to hope for improvement, she is struggling with seeing Jared Mayer is his current condition. She is aware of the difference between continuing with current plan of care versus considering transition to comfort care.  Answered and addressed all questions and concerns. PMT to continue to follow for ongoing needs and support. Plan set to meet at bedside tomorrow afternoon.   Care plan was discussed with nursing staff and ICU team.  Thank you for allowing the Palliative Medicine Team to assist in the care of this patient.  Total time:  25 minutes  Time spent includes: Detailed review of medical records (labs, imaging, vital signs), medically appropriate exam (mental status, respiratory, cardiac, skin), discussed with treatment team, counseling and educating patient, family  and staff, documenting clinical information, medication management and coordination of care.  Leeanne Deed, DNP, AGNP-C Palliative Medicine   Please contact Palliative Medicine Team phone at [phone] for questions and concerns.

## 2023-05-09 ENCOUNTER — Inpatient Hospital Stay: Payer: Medicare HMO

## 2023-05-09 DIAGNOSIS — F250 Schizoaffective disorder, bipolar type: Secondary | ICD-10-CM | POA: Diagnosis not present

## 2023-05-09 DIAGNOSIS — J9601 Acute respiratory failure with hypoxia: Secondary | ICD-10-CM | POA: Diagnosis not present

## 2023-05-09 DIAGNOSIS — Z515 Encounter for palliative care: Secondary | ICD-10-CM | POA: Diagnosis not present

## 2023-05-09 DIAGNOSIS — R4182 Altered mental status, unspecified: Secondary | ICD-10-CM | POA: Diagnosis not present

## 2023-05-09 LAB — URINALYSIS, COMPLETE (UACMP) WITH MICROSCOPIC
Bacteria, UA: NONE SEEN
Bilirubin Urine: NEGATIVE
Glucose, UA: NEGATIVE mg/dL
Hgb urine dipstick: NEGATIVE
Ketones, ur: NEGATIVE mg/dL
Leukocytes,Ua: NEGATIVE
Nitrite: NEGATIVE
Protein, ur: 30 mg/dL — AB
Specific Gravity, Urine: 1.033 — ABNORMAL HIGH (ref 1.005–1.030)
Squamous Epithelial / HPF: 0 /[HPF] (ref 0–5)
pH: 5 (ref 5.0–8.0)

## 2023-05-09 LAB — CBC
HCT: 30.7 % — ABNORMAL LOW (ref 39.0–52.0)
Hemoglobin: 10.3 g/dL — ABNORMAL LOW (ref 13.0–17.0)
MCH: 32.3 pg (ref 26.0–34.0)
MCHC: 33.6 g/dL (ref 30.0–36.0)
MCV: 96.2 fL (ref 80.0–100.0)
Platelets: 456 10*3/uL — ABNORMAL HIGH (ref 150–400)
RBC: 3.19 MIL/uL — ABNORMAL LOW (ref 4.22–5.81)
RDW: 14.4 % (ref 11.5–15.5)
WBC: 5.4 10*3/uL (ref 4.0–10.5)
nRBC: 0 % (ref 0.0–0.2)

## 2023-05-09 LAB — COMPREHENSIVE METABOLIC PANEL
ALT: 28 U/L (ref 0–44)
AST: 22 U/L (ref 15–41)
Albumin: 1.8 g/dL — ABNORMAL LOW (ref 3.5–5.0)
Alkaline Phosphatase: 79 U/L (ref 38–126)
Anion gap: 8 (ref 5–15)
BUN: 16 mg/dL (ref 6–20)
CO2: 22 mmol/L (ref 22–32)
Calcium: 7.9 mg/dL — ABNORMAL LOW (ref 8.9–10.3)
Chloride: 105 mmol/L (ref 98–111)
Creatinine, Ser: 0.49 mg/dL — ABNORMAL LOW (ref 0.61–1.24)
GFR, Estimated: >60 mL/min (ref >60)
Glucose, Bld: 117 mg/dL — ABNORMAL HIGH (ref 70–99)
Potassium: 3.7 mmol/L (ref 3.5–5.1)
Sodium: 135 mmol/L (ref 135–145)
Total Bilirubin: 0.6 mg/dL (ref <1.2)
Total Protein: 5.5 g/dL — ABNORMAL LOW (ref 6.5–8.1)

## 2023-05-09 LAB — GLUCOSE, CAPILLARY
Glucose-Capillary: 100 mg/dL — ABNORMAL HIGH (ref 70–99)
Glucose-Capillary: 103 mg/dL — ABNORMAL HIGH (ref 70–99)
Glucose-Capillary: 111 mg/dL — ABNORMAL HIGH (ref 70–99)
Glucose-Capillary: 111 mg/dL — ABNORMAL HIGH (ref 70–99)
Glucose-Capillary: 121 mg/dL — ABNORMAL HIGH (ref 70–99)
Glucose-Capillary: 121 mg/dL — ABNORMAL HIGH (ref 70–99)

## 2023-05-09 LAB — CULTURE, BAL-QUANTITATIVE W GRAM STAIN

## 2023-05-09 LAB — TRIGLYCERIDES: Triglycerides: 177 mg/dL — ABNORMAL HIGH (ref <150)

## 2023-05-09 LAB — MAGNESIUM: Magnesium: 1.8 mg/dL (ref 1.7–2.4)

## 2023-05-09 LAB — CK: Total CK: 108 U/L (ref 49–397)

## 2023-05-09 LAB — PHOSPHORUS: Phosphorus: 3.1 mg/dL (ref 2.5–4.6)

## 2023-05-09 MED ORDER — LACTATED RINGERS IV BOLUS
500.0000 mL | Freq: Once | INTRAVENOUS | Status: AC
  Administered 2023-05-09: 500 mL via INTRAVENOUS

## 2023-05-09 MED ORDER — ACETAMINOPHEN 10 MG/ML IV SOLN
1000.0000 mg | Freq: Once | INTRAVENOUS | Status: AC
  Administered 2023-05-09: 1000 mg via INTRAVENOUS
  Filled 2023-05-09: qty 100

## 2023-05-09 MED ORDER — ORAL CARE MOUTH RINSE: 15.0000 mL | OROMUCOSAL | Status: DC | PRN

## 2023-05-09 MED ORDER — MAGNESIUM SULFATE 2 GM/50ML IV SOLN
2.0000 g | Freq: Once | INTRAVENOUS | Status: AC
  Administered 2023-05-09: 2 g via INTRAVENOUS
  Filled 2023-05-09: qty 50

## 2023-05-09 MED ORDER — BROMOCRIPTINE MESYLATE 2.5 MG PO TABS
7.5000 mg | ORAL_TABLET | Freq: Four times a day (QID) | ORAL | Status: DC
  Filled 2023-05-09 (×2): qty 3

## 2023-05-09 MED ORDER — BROMOCRIPTINE MESYLATE 2.5 MG PO TABS
5.0000 mg | ORAL_TABLET | Freq: Four times a day (QID) | ORAL | Status: DC
  Administered 2023-05-09 – 2023-05-11 (×7): 5 mg via NASOGASTRIC
  Filled 2023-05-09 (×10): qty 2

## 2023-05-09 MED ORDER — SODIUM CHLORIDE 0.9 % IV SOLN
2.0000 g | INTRAVENOUS | Status: AC
  Administered 2023-05-09 – 2023-05-10 (×2): 2 g via INTRAVENOUS
  Filled 2023-05-09 (×2): qty 20

## 2023-05-09 MED ORDER — ORAL CARE MOUTH RINSE
15.0000 mL | OROMUCOSAL | Status: DC
Start: 2023-05-10 — End: 2023-05-11
  Administered 2023-05-10 – 2023-05-11 (×19): 15 mL via OROMUCOSAL

## 2023-05-09 NOTE — Plan of Care (Signed)
°  Problem: Clinical Measurements: Goal: Will remain free from infection Outcome: Progressing   Problem: Fluid Volume: Goal: Hemodynamic stability will improve Outcome: Not Progressing   Problem: Clinical Measurements: Goal: Diagnostic test results will improve Outcome: Not Progressing Goal: Signs and symptoms of infection will decrease Outcome: Not Progressing   Problem: Respiratory: Goal: Ability to maintain adequate ventilation will improve Outcome: Not Progressing   Problem: Clinical Measurements: Goal: Ability to maintain clinical measurements within normal limits will improve Outcome: Not Progressing   Problem: Activity: Goal: Risk for activity intolerance will decrease Outcome: Not Progressing

## 2023-05-09 NOTE — Progress Notes (Signed)
Daily Progress Note   Patient Name: Jared Mayer       Date: 05/09/2023 DOB: 1970/08/25  Age: 52 y.o. MRN#: [id number] Attending Physician: Janann Colonel, MD Primary Care Physician: [facility] Admit Date: 04/08/2023  Reason for Consultation/Follow-up: Establishing goals of care  HPI/Brief Hospital Review: 52 y.o. male  with past medical history of intellectual disability, schizoaffective disorder, drug-induced parkinsonism, HLD and hypothyroidism admitted from group home on 04/08/2023 with AMS, erratic behavior and recurrent falls.   Concern for neuroleptic malignant syndrome-has been started on bromocriptine.   12/22 NGT remains-discussions surrounding possible PEG placement due to high risk for aspiration and high risk for limited PO intake Per neuro recommendations, APAP d/c'd, considering sources of infection due to continued leukocytosis   Unfortunately developed acute respiratory failure requiring mechanical ventilation likely secondary to aspiration pneumonia on 12/26  12/29 started on Propofol yesterday due to concern for seizure activity possibly related to spike in temperature, Ceribel placed without any signs of seizure activity   Palliative Medicine consulted for assisting with goals of care conversations.  Subjective: Extensive chart review has been completed prior to meeting patient including labs, vital signs, imaging, progress notes, orders, and available advanced directive documents from current and previous encounters.    Visited with Mr. Schramm at his bedside. He remains intubated and sedated. Temperatures continue to fluctuate. Sputum cultures positive for staph aureus-antibiotics have been restarted.  Later met at bedside with [name]-mother, step  father, ICU attending as well as Dr. Amada Jupiter via speaker phone. Dr. Larinda Buttery and Dr. Amada Jupiter provided medical updates. They discussed ongoing concern related to mental status. Recommendations at this time to continue antibiotic therapy for the next 48 hours and reassessing mental status at that time. [name] remains clear she is focused on quality of life and would not be accepting of interventions that would impact Mr. Tessmann ability to engage in conversation, interact with his family and enjoy watching his television.  PMT will continue to follow for ongoing needs and support.  Thank you for allowing the Palliative Medicine Team to assist in the care of this patient.  Total time:  35 minutes  Time spent includes: Detailed review of medical records (labs, imaging, vital signs), medically appropriate exam (mental status, respiratory, cardiac, skin), discussed with treatment team, counseling and educating patient, family and staff,  documenting clinical information, medication management and coordination of care.  Leeanne Deed, DNP, AGNP-C Palliative Medicine   Please contact Palliative Medicine Team phone at [phone] for questions and concerns.

## 2023-05-09 NOTE — Progress Notes (Addendum)
NAME:  Jared Mayer, MRN:  [id number], DOB:  22-Apr-1971, LOS: 31 ADMISSION DATE:  04/08/2023  History of Present Illness:  This is a case of a 52 year old male patient with a past medical history of Lewy body dementia with behavioral disturbances, baseline moderate intellectual disability, mood disorder, prior presentation for an intentional lamotrigine overdose, chronic hypotension on midodrine, drug-induced parkinsonism who presented initially to [facility] with increased combative behavior from assisted living.  His medications were adjusted and he was discharged back to assisted living.  However on 11/28 a day after discharge he presents back with gait instability disorientation and repetitive speech pattern as well as erratic behavior.  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  Therefore his psychotropic medications were stopped.  Unfortunately course complicated by acute hypoxic respiratory failure overnight requiring intubation and mechanical ventilation.  Thought to be secondary to an aspiration event.  The only changes in medications was starting baclofen yesterday.  He has been on benztropine, clonazepam and bromocriptine.  Chest x-ray shows high endotracheal tube that was advanced by 2 cm, left IJ in place.  No significant parenchymal lung disease.  OG tube in place.  Started on ceftriaxone for aspiration pneumonia.   Pertinent  Medical History  -- Bipolar disorder schizoaffective features has been on significant mood stabilizers and antipsychotics including lamotrigine, topiramate, clonazepam, paroxetine, valproic acid, Ingrezza, loxapine, olanzapine.  Currently on benztropine clonazepam and bromocriptine  Significant Hospital Events: Including procedures, antibiotic start and stop dates in addition to other pertinent events   05/06/2023 intubated and mechanically ventilated for  hypoxic respiratory failure secondary to aspiration event. 05/07/2023 Remains intubated, barely following commands. S/p bronchoscopy 12/26 culture negative so far. DNR.  05/08/2023 spiked a fever to 101 leading to seizure episode. Deeply sedated. Ceribel without any signs of seizure.  05/09/2023 Spiked another fever of 102. BAL noted to be + for Staph aureus. Will restart ceftriaxone 2gm for 7 days.   Interim History / Subjective:  Patient spiked a fever yesterday evening to 101 and had an episode of seizure requiring  deep sedation. Cerebel placed without any seizure like activity.   Spiked another fever today to 102F. Placed back on the cooling blanket. BAL culture resulted today with MSSA. Started on Ceftriaxone IV.   Objective   Blood pressure (!) 83/65, pulse 80, temperature 100.2 F (37.9 C), temperature source Esophageal, resp. rate 20, height 5\' 8"  (1.727 m), weight 79.2 kg, SpO2 92%.    Vent Mode: PRVC FiO2 (%):  [24 %] 24 % Set Rate:  [20 bmp] 20 bmp Vt Set:  [500 mL] 500 mL PEEP:  [5 cmH20] 5 cmH20 Plateau Pressure:  [15 cmH20-24 cmH20] 15 cmH20   Intake/Output Summary (Last 24 hours) at 05/09/2023 1343 Last data filed at 05/09/2023 1300 Gross per 24 hour  Intake 2146.27 ml  Output 1065 ml  Net 1081.27 ml   Filed Weights   05/07/23 0426 05/08/23 0500 05/09/23 0416  Weight: 73.7 kg 75.4 kg 79.2 kg    Examination: General: Intubated sedated able to follow minimal commands HENT: Supple neck reactive pupils Lungs: Diffuse rhonchi heard bilaterally Cardiovascular: Normal S1, normal S2, regular rate and rhythm Abdomen: Soft nontender nondistended positive bowel sounds Extremities: Warm well-perfused no edema  WBC today at 5.4. Plt stable. Kidney function intact. MRSA -. MSSA in BAL.    Assessment & Plan:  Case of a 52 year old male patient with a past medical  history of bipolar disorder, Lewy body dementia with behavioral disturbances and significant psychotropic  medications was on the floor since 11/28 with neuroleptic malignant syndrome now course complicated by acute hypoxic respiratory failure requiring intubation and mechanical ventilation now on 12/26 for aspiration pneumonia.  # Acute hypoxic respiratory failure secondary to... # Aspiration pneumonia with thick secretions. BAL w/ MSSA 12/29 # Shock likely distributive secondary to sepsis in the setting of the above # Neuroleptic malignant syndrome secondary to psychotropic medications which are on hold currently on bromocriptine # Lewy body dementia with behavioral disturbances and drug-induced parkinsonism currently on clonazepam and benztropine.  CNS: D/c Precedex for fevers. C/w Propofol drip Fentanyl drip and versed as needed. RASS 0 to -1. C/w Benztropine, Clonazepam and bromocriptine (Increasing today per neurology).  CVS: NE MAPs> 65. LR 500 cc once today.  Pulm: MRSA swab neg. D/c Vanc c/w zosyn. C/w Ceftriaxone for a total of 7 days. (BAL + for MSSA).   GI: c/w Tube feeds. Seems to be tolerated well. Will schedule daily miralax. PPI for prophylaxis.  Renal: Monitor uop.  Heme: Lovenox for DVT px.  Endo: POC 140-180  Best Practice (right click and "Reselect all SmartList Selections" daily)   Diet/type: Restart tube feeds.  DVT prophylaxis prophylactic heparin  Pressure ulcer(s): N/A GI prophylaxis: PPI Lines: N/A Foley:  Yes, and it is still needed Code Status:  DNR (Palliative Team discussed with family members)   On going discussion with family members regarding QOL and care moving forward.   Last date of multidisciplinary goals of care discussion [05/09/2023]  I spent 40 minutes caring for this patient today, including preparing to see the patient, obtaining a medical history , reviewing a separately obtained history, performing a medically appropriate examination and/or evaluation, counseling and educating the patient/family/caregiver, ordering medications, tests, or  procedures, documenting clinical information in the electronic health record, and independently interpreting results (not separately reported/billed) and communicating results to the patient/family/caregiver  Janann Colonel, MD [facility] 05/09/2023 1:43 PM

## 2023-05-09 NOTE — Progress Notes (Signed)
Patient had a sudden spike in temperature to 102 requiring re-initiation of cooling blanket.  Dr. Larinda Buttery aware and placed lab orders as well as Tylenol IV.

## 2023-05-09 NOTE — Procedures (Addendum)
Patient Name: Crystopher Durst  MRN: [id number]  Epilepsy Attending: Charlsie Quest  Referring Physician/Provider: Rejeana Brock, MD  Duration: 03/08/2023 1622 to 1654, 2252 to 05/09/2023 1147  Patient history: 52yo M with ams getting ceribell EEG to evaluate for seizure  Level of alertness: lethargic  AEDs during EEG study: Clonazepam, propofol  Technical aspects: This EEG was obtained using a 10 lead EEG system positioned circumferentially without any parasagittal coverage (rapid EEG). Computer selected EEG is reviewed as  well as background features and all clinically significant events.  Description: EEG showed continuous generalized 4 to 6 Hz theta-delta slowing with overriding 12-14hz  beta activity distributed symmetrically and diffusely.  Hyperventilation and photic stimulation were not performed.     ABNORMALITY - Continuous slow, generalized  IMPRESSION: This limited ceribell EEG is suggestive of moderate to severe diffuse encephalopathy likely related to sedation. No seizures or epileptiform discharges were seen throughout the recording.  Wenceslao Loper [name]

## 2023-05-09 NOTE — Plan of Care (Signed)
Continuing with plan of care. 

## 2023-05-09 NOTE — Progress Notes (Addendum)
NEUROLOGY CONSULT FOLLOW UP NOTE   Date of service: May 09, 2023 Patient Name: Jared Mayer MRN:  [id number] DOB:  04-04-1971  Brief HPI  Jared Mayer is a 52 y.o. male who presented with  past medical history significant for possible Lewy body dementia with behavioral disturbance, baseline moderate intellectual disability, mood disorder (schizoaffective disorder bipolar type), prior presentations for unintentional lamotrigine overdose, chronic hypotension on midodrine, hyperlipidemia, drug-induced parkinsonism, chronic right-sided weakness.   He had been haivng increasing problems with aggression and on 11/22 his loxapin was increased(40 for a 14 day supply). His CK was noted to be elevated and was attributed to rhabdomyolysis.   He was noted to have some disorientation and gait instability on 11/28 as well as significant aggression. He was found to be febrile at that time and started on abx. His loxapine was continued until 12/11.  His CK peaked on 12/10 at 1500.   On 12/11, psychiatry was reconsulted due to significant worsening with contorted face, rigid extremities. It was at this time that NMS was considered and loxapine was stopped.   He had been on multiple psychotropic medicines including Lamotrigine 100 mg daily (discontinued 12/11) Topiramate 75 mg twice daily (discontinued 12/11) Clonazepam ordered as 1 mg twice daily as needed here Paroxetine 40 mg daily (discontinued 12/11) Valproic acid 2000 mg nightly (discontinued 12/11) Ingrezza 80 mg nightly (discontinued 12/11) Loxapine 25 mg 3 times daily (discontinued 12/11) Olanzapine (previously reported to be taking 10 mg in the morning and 20 mg at night or perhaps 30 mg nightly; reported not taking on 04/03/2023; has not been receiving this here)  Due to poor GI motility, he was started on metoclopramide 10mg  Q8H on 12/23. It was noted that the continued to have increased tone on 12/25 and therefore baclofen 5mg  BID was added.     I increased bromocriptine from 5mg  TID to QID on 12/18.    Interval Hx/subjective   He had an episode yesterday afternoon during which he bit his ET tube, had diffuse tremors/agitation and desatted. He has desatted with jaw clenching at other times as well.   Vitals   Vitals:   05/09/23 0606 05/09/23 0700 05/09/23 0800 05/09/23 0900  BP:  94/61 92/61 (!) 89/59  Pulse: 63 72 72 78  Resp: 20 20 20 20   Temp: 98.1 F (36.7 C) 98.4 F (36.9 C) 98.8 F (37.1 C) 99.5 F (37.5 C)  TempSrc:  Esophageal Esophageal Esophageal  SpO2: 94% 94% 93% 94%  Weight:      Height:         Body mass index is 26.55 kg/m.  Physical Exam   General: in bed, intubated.   Neurologic Examination   MS: awakens to voice, follows commands to  lift amrms CN: crosses midline in both directions, fixates and tracks Motor: His tone is increased, he has bilateral foot flexion and wrist flexion.  The distribution is flexor more than extensor hypertonicity.   Sensory: responds to nox stim x 4.   Labs and Diagnostic Imaging   CBC:  Recent Labs  Lab 05/08/23 0358 05/09/23 0409  WBC 9.9 5.4  HGB 10.3* 10.3*  HCT 31.1* 30.7*  MCV 97.2 96.2  PLT 403* 456*    Basic Metabolic Panel:  Lab Results  Component Value Date   NA 135 05/09/2023   K 3.7 05/09/2023   CO2 22 05/09/2023   GLUCOSE 117 (H) 05/09/2023   BUN 16 05/09/2023   CREATININE 0.49 (L) 05/09/2023   CALCIUM 7.9 (  L) 05/09/2023   GFRNONAA >60 05/09/2023   GFRAA >60 04/01/2018   Lipid Panel: No results found for: "LDLCALC" HgbA1c: No results found for: "HGBA1C" Urine Drug Screen:     Component Value Date/Time   LABOPIA NONE DETECTED 04/03/2023 0020   COCAINSCRNUR NONE DETECTED 04/03/2023 0020   LABBENZ POSITIVE (A) 04/03/2023 0020   AMPHETMU NONE DETECTED 04/03/2023 0020   THCU NONE DETECTED 04/03/2023 0020   LABBARB NONE DETECTED 04/03/2023 0020    Alcohol Level     Component Value Date/Time   ETH <10 04/03/2023 0020   INR   Lab Results  Component Value Date   INR 1.1 04/09/2023   APTT No results found for: "APTT"  Impression   Jared Mayer is a 52 y.o. male who presented with fever, confusion, increased tone, elevated CK couple of weeks after increasing his loxapine dose.  There was some delay in recognition given multiple confounding factors (history of Lewy body dementia, intellectual disability, multiple medications).  Following cessation of loxapine, his CKs have improved.  His worsening mental status on 12/26 could have multiple etiologies including addition of baclofen to other sedating medications, recurrent NMS type symptomatology in the setting of metoclopramide being added, or simply aspiration.    With the duration of symptoms at this point, I felt looking for other etiologies would be prudent, though NMS with re-worsening due to reglan is still highest on my differential.  MRI brain and C-spine show no evidence of a cause of hypertonicity.  My suspicion is that the event yesterday was not a seizure, but difficult to be certain. With improving mental status, would continue to wean sedation,work towards extubation, and observe.  Recommendations  Continue clonazepam 2 mg every 6 hours Continue bromocriptine to 5 mg q6h Continue benztropine 2 mg 3 times daily Routine EEG tomorrow.  Neurology will continue to follow ______________________________________________________________________   Ritta Slot, MD [facility] [phone]  If 7pm- 7am, please page neurology on call as listed in AMION. 05/09/2023  11:29 AM

## 2023-05-09 NOTE — Progress Notes (Signed)
Per Dr. Trish Mage can be removed.

## 2023-05-09 NOTE — Progress Notes (Signed)
PHARMACY CONSULT NOTE - FOLLOW UP  Pharmacy Consult for Electrolyte Monitoring and Replacement   Recent Labs: Potassium (mmol/L)  Date Value  05/09/2023 3.7   Magnesium (mg/dL)  Date Value  47/82/9562 1.8   Calcium (mg/dL)  Date Value  13/12/6576 7.9 (L)   Albumin (g/dL)  Date Value  46/96/2952 1.8 (L)   Phosphorus (mg/dL)  Date Value  84/13/2440 3.1   Sodium (mmol/L)  Date Value  05/09/2023 135     Assessment: 52 y/o male with h/o intellectual disability, schizoeffective disorder, bipolar, anxiety, depression, dementia, HLD, chronic constipation, hpothyroidism and drug-induced parkinsons who was admitted from group home on 04/08/2023 with AMS, erratic behavior and recurrent falls and was found to have Oropharyngeal dysphagia, SIRS, neurolept malignant syndrome and rhabdomyolysis. Pharmacy is asked to follow and replace electrolytes while in CCU  Nutrition: vital AF + FWF 30 mL every 4 hours  Goal of Therapy:  Electrolytes WNL  Plan:  ---2 grams IV magnesium sulfate x 1 ---recheck electrolytes in am  Lowella Bandy ,PharmD Clinical Pharmacist 05/09/2023 7:03 AM

## 2023-05-09 NOTE — Progress Notes (Signed)
Dr. Larinda Buttery at bedside and Dr. Amada Jupiter available by phone to give updates to patient's mom.

## 2023-05-10 ENCOUNTER — Ambulatory Visit: Payer: Medicare HMO

## 2023-05-10 DIAGNOSIS — F250 Schizoaffective disorder, bipolar type: Secondary | ICD-10-CM | POA: Diagnosis not present

## 2023-05-10 DIAGNOSIS — Z515 Encounter for palliative care: Secondary | ICD-10-CM | POA: Diagnosis not present

## 2023-05-10 DIAGNOSIS — M6282 Rhabdomyolysis: Secondary | ICD-10-CM | POA: Diagnosis not present

## 2023-05-10 DIAGNOSIS — J9601 Acute respiratory failure with hypoxia: Secondary | ICD-10-CM | POA: Diagnosis not present

## 2023-05-10 LAB — GLUCOSE, CAPILLARY
Glucose-Capillary: 58 mg/dL — ABNORMAL LOW (ref 70–99)
Glucose-Capillary: 95 mg/dL (ref 70–99)
Glucose-Capillary: 61 mg/dL — ABNORMAL LOW (ref 70–99)
Glucose-Capillary: 97 mg/dL (ref 70–99)
Glucose-Capillary: 92 mg/dL (ref 70–99)
Glucose-Capillary: 60 mg/dL — ABNORMAL LOW (ref 70–99)
Glucose-Capillary: 64 mg/dL — ABNORMAL LOW (ref 70–99)
Glucose-Capillary: 81 mg/dL (ref 70–99)
Glucose-Capillary: 63 mg/dL — ABNORMAL LOW (ref 70–99)
Glucose-Capillary: 121 mg/dL — ABNORMAL HIGH (ref 70–99)

## 2023-05-10 LAB — BASIC METABOLIC PANEL
Anion gap: 6 (ref 5–15)
BUN: 13 mg/dL (ref 6–20)
CO2: 24 mmol/L (ref 22–32)
Calcium: 7.1 mg/dL — ABNORMAL LOW (ref 8.9–10.3)
Chloride: 106 mmol/L (ref 98–111)
Creatinine, Ser: 0.42 mg/dL — ABNORMAL LOW (ref 0.61–1.24)
GFR, Estimated: >60 mL/min (ref >60)
Glucose, Bld: 101 mg/dL — ABNORMAL HIGH (ref 70–99)
Potassium: 3.5 mmol/L (ref 3.5–5.1)
Sodium: 136 mmol/L (ref 135–145)

## 2023-05-10 LAB — MAGNESIUM: Magnesium: 1.9 mg/dL (ref 1.7–2.4)

## 2023-05-10 LAB — CBC
HCT: 29 % — ABNORMAL LOW (ref 39.0–52.0)
Hemoglobin: 9.7 g/dL — ABNORMAL LOW (ref 13.0–17.0)
MCH: 32 pg (ref 26.0–34.0)
MCHC: 33.4 g/dL (ref 30.0–36.0)
MCV: 95.7 fL (ref 80.0–100.0)
Platelets: 434 10*3/uL — ABNORMAL HIGH (ref 150–400)
RBC: 3.03 MIL/uL — ABNORMAL LOW (ref 4.22–5.81)
RDW: 14.9 % (ref 11.5–15.5)
WBC: 5.4 10*3/uL (ref 4.0–10.5)
nRBC: 0 % (ref 0.0–0.2)

## 2023-05-10 LAB — TRIGLYCERIDES: Triglycerides: 130 mg/dL (ref <150)

## 2023-05-10 MED ORDER — DEXTROSE 50 % IV SOLN
INTRAVENOUS | Status: AC
  Administered 2023-05-10: 12.5 g via INTRAVENOUS
  Filled 2023-05-10: qty 50

## 2023-05-10 MED ORDER — DEXTROSE 50 % IV SOLN
12.5000 g | Freq: Once | INTRAVENOUS | Status: AC
  Administered 2023-05-10: 12.5 g via INTRAVENOUS
  Filled 2023-05-10: qty 50

## 2023-05-10 MED ORDER — DEXTROSE 50 % IV SOLN: 12.5000 g | Freq: Once | INTRAVENOUS | Status: AC

## 2023-05-10 MED ORDER — DEXTROSE IN LACTATED RINGERS 5 % IV SOLN
INTRAVENOUS | Status: DC
Start: 2023-05-10 — End: 2023-05-10

## 2023-05-10 MED ORDER — DEXTROSE 50 % IV SOLN
12.5000 g | INTRAVENOUS | Status: AC
  Administered 2023-05-10: 12.5 g via INTRAVENOUS
  Filled 2023-05-10: qty 50

## 2023-05-10 MED ORDER — ACETAMINOPHEN 10 MG/ML IV SOLN
1000.0000 mg | Freq: Four times a day (QID) | INTRAVENOUS | Status: DC
  Administered 2023-05-10 – 2023-05-11 (×3): 1000 mg via INTRAVENOUS
  Filled 2023-05-10 (×5): qty 100

## 2023-05-10 MED ORDER — DEXTROSE 10 % IV SOLN: INTRAVENOUS | Status: DC

## 2023-05-10 MED ORDER — MAGNESIUM SULFATE 2 GM/50ML IV SOLN
2.0000 g | Freq: Once | INTRAVENOUS | Status: AC
  Administered 2023-05-10: 2 g via INTRAVENOUS
  Filled 2023-05-10: qty 50

## 2023-05-10 MED ORDER — POTASSIUM CHLORIDE 20 MEQ PO PACK
40.0000 meq | PACK | Freq: Once | ORAL | Status: AC
  Administered 2023-05-10: 40 meq
  Filled 2023-05-10: qty 2

## 2023-05-10 MED ORDER — CEFAZOLIN SODIUM-DEXTROSE 2-4 GM/100ML-% IV SOLN
2.0000 g | Freq: Three times a day (TID) | INTRAVENOUS | Status: DC
Start: 2023-05-11 — End: 2023-05-11
  Administered 2023-05-11: 2 g via INTRAVENOUS
  Filled 2023-05-10 (×3): qty 100

## 2023-05-10 MED FILL — Fentanyl Citrate-NaCl 0.9% IV Soln 2.5 MG/250ML: INTRAVENOUS | Qty: 250 | Status: AC

## 2023-05-10 NOTE — Progress Notes (Signed)
At approximately 0315, pt vent alarmed. This nurse entered room to find that patient was coughing with moderate amount of emesis on gown. Tube feeds turned off. Connected patient to low continues suction for about 15 minutes. 1.1L TF residuals removed within that 15 minutes. NGT changed to low-intermittent suction. Anna Genre, NP notified. To hold 0400 and 0600 meds (Klonopin and Bromocriptine) at this time.   0401 - Hypoglycemia - CBG noted to be 58. 12.5gm of D50 administered per hypoglycemia protocol.   1696 - Repeat CBG 95. No additional interventions needed at this time.

## 2023-05-10 NOTE — Progress Notes (Signed)
Nutrition Follow-up  DOCUMENTATION CODES:   Not applicable  INTERVENTION:   Recommend fluoroscopy guided post pyloric NGT placement if plan is for full scope of care.   Once tube feeds initiated, recommend:  Vital 1.5@50ml /hr + ProSource TF 20- Give 60ml BID via tube.  Free water flushes 30ml q4 hours to maintain tube patency   Regimen provides 1960kcal/day, 121g/day protein and 1020ml/day of free water.   Pt remains at high refeed risk; recommend monitor potassium, magnesium and phosphorus labs daily until stable once tube feeds initiated.   Daily weights   NUTRITION DIAGNOSIS:   Inadequate oral intake related to inability to eat as evidenced by NPO status.  GOAL:   Patient will meet greater than or equal to 90% of their needs -previously met with tube feeds   MONITOR:   Vent status, Labs, Weight trends, TF tolerance, Skin, I & O's  ASSESSMENT:   52 y/o male with h/o intellectual disability, schizoeffective disorder, bipolar, anxiety, depression, dementia, HLD, chronic constipation, hpothyroidism and drug-induced parkinsons who was admitted from group home on 04/08/2023 with AMS, erratic behavior and recurrent falls and was found to have Oropharyngeal dysphagia, SIRS, neurolept malignant syndrome and rhabdomyolysis.  Pt sedated and ventilated. NGT remains in place. Pt noted to have another episode of regurgitation overnight and tube feeds were held. Palliative care following. Would recommend fluoroscopy guided post pyloric NGT placement if plan is for full scope of care. Pt will ultimately need surgically placed J tube and SNF placement prior to discharge. No BM noted since 12/25; pt is receiving bowel regimen. Per chart, pt is weight stable since admission.    Medications reviewed and include: colace, lovenox, robinul, synthroid, midodrine, MVI, protonix, miralax, cefazolin, LRS w/ 5% dextrose @40ml /hr, levophed, propofol   Labs reviewed: K 3.5 wnl, creat 0.42(L), Mg 1.9  wnl P 3.1 wnl- 12/29 Hgb 9.7(L), Hct 29.0(L) Cbgs- 92, 97, 61, 95, 58 x 24 hrs   Patient is currently intubated on ventilator support MV: 10.0 L/min Temp (24hrs), Avg:99.4 F (37.4 C), Min:95.5 F (35.3 C), Max:100.9 F (38.3 C)  Propofol: 13.57 ml/hr- provides 358kcal/day   MAP- >63mmHg   UOP-   Diet Order:   Diet Order             Diet NPO time specified  Diet effective now                  EDUCATION NEEDS:   Not appropriate for education at this time  Skin:  Skin Assessment: Reviewed RN Assessment  Last BM:  12/25  Height:   Ht Readings from Last 1 Encounters:  05/09/23 5' 7.99" (1.727 m)    Weight:   Wt Readings from Last 1 Encounters:  05/10/23 78.3 kg    Ideal Body Weight:  70 kg  BMI:  Body mass index is 26.25 kg/m.  Estimated Nutritional Needs:   Kcal:  2039kcal/day  Protein:  105-120g/day  Fluid:  1.8-2.1L/day  Betsey Holiday MS, RD, LDN If unable to be reached, please send secure chat to "RD inpatient" available from 8:00a-4:00p daily

## 2023-05-10 NOTE — Plan of Care (Signed)
  Problem: Fluid Volume: Goal: Hemodynamic stability will improve Outcome: Not Progressing   Problem: Clinical Measurements: Goal: Diagnostic test results will improve Outcome: Progressing Goal: Signs and symptoms of infection will decrease Outcome: Not Progressing   Problem: Respiratory: Goal: Ability to maintain adequate ventilation will improve Outcome: Progressing   Problem: Education: Goal: Knowledge of General Education information will improve Description: Including pain rating scale, medication(s)/side effects and non-pharmacologic comfort measures Outcome: Not Progressing   Problem: Health Behavior/Discharge Planning: Goal: Ability to manage health-related needs will improve Outcome: Not Progressing   Problem: Clinical Measurements: Goal: Ability to maintain clinical measurements within normal limits will improve Outcome: Progressing Goal: Will remain free from infection Outcome: Progressing Goal: Diagnostic test results will improve Outcome: Progressing Goal: Respiratory complications will improve Outcome: Progressing Goal: Cardiovascular complication will be avoided Outcome: Progressing   Problem: Activity: Goal: Risk for activity intolerance will decrease Outcome: Progressing   Problem: Nutrition: Goal: Adequate nutrition will be maintained Outcome: Not Progressing   Problem: Coping: Goal: Level of anxiety will decrease Outcome: Not Progressing   Problem: Elimination: Goal: Will not experience complications related to bowel motility Outcome: Not Progressing Goal: Will not experience complications related to urinary retention Outcome: Progressing   Problem: Pain Management: Goal: General experience of comfort will improve Outcome: Progressing   Problem: Safety: Goal: Ability to remain free from injury will improve Outcome: Progressing   Problem: Skin Integrity: Goal: Risk for impaired skin integrity will decrease Outcome: Not Progressing    Patient remains intubated/sedated. Pt intermittently following commands, though does become very tense upon awakening and during cares. IV drips continue per [facility].

## 2023-05-10 NOTE — Progress Notes (Signed)
NAME:  Jared Mayer, MRN:  [id number], DOB:  1970/12/10, LOS: 32 ADMISSION DATE:  04/08/2023  History of Present Illness:  This is a case of a 52 year old male patient with a past medical history of Lewy body dementia with behavioral disturbances, baseline moderate intellectual disability, mood disorder, prior presentation for an intentional lamotrigine overdose, chronic hypotension on midodrine, drug-induced parkinsonism who presented initially to [facility] with increased combative behavior from assisted living.  His medications were adjusted and he was discharged back to assisted living.  However on 11/28 a day after discharge he presents back with gait instability disorientation and repetitive speech pattern as well as erratic behavior.  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  Therefore his psychotropic medications were stopped.  Unfortunately course complicated by acute hypoxic respiratory failure overnight requiring intubation and mechanical ventilation.  Thought to be secondary to an aspiration event.  The only changes in medications was starting baclofen yesterday.  He has been on benztropine, clonazepam and bromocriptine.  Chest x-ray shows high endotracheal tube that was advanced by 2 cm, left IJ in place.  No significant parenchymal lung disease.  OG tube in place.  Started on ceftriaxone for aspiration pneumonia.   Pertinent  Medical History  -- Bipolar disorder schizoaffective features has been on significant mood stabilizers and antipsychotics including lamotrigine, topiramate, clonazepam, paroxetine, valproic acid, Ingrezza, loxapine, olanzapine.  Currently on benztropine clonazepam and bromocriptine  Significant Hospital Events: Including procedures, antibiotic start and stop dates in addition to other pertinent events   05/06/2023 intubated and mechanically ventilated for  hypoxic respiratory failure secondary to aspiration event. 05/07/2023 Remains intubated, barely following commands. S/p bronchoscopy 12/26 culture negative so far. DNR.  05/08/2023 spiked a fever to 101 leading to seizure episode. Deeply sedated. Ceribel without any signs of seizure.  05/09/2023 Spiked another fever of 102. BAL noted to be + for Staph aureus. Will restart ceftriaxone 2gm for 7 days.    Objective   Blood pressure 93/60, pulse 65, temperature (!) 100.8 F (38.2 C), resp. rate 20, height 5' 7.99" (1.727 m), weight 78.3 kg, SpO2 98%.    Vent Mode: PRVC FiO2 (%):  [24 %-28 %] 28 % Set Rate:  [20 bmp] 20 bmp Vt Set:  [500 mL] 500 mL PEEP:  [5 cmH20] 5 cmH20 Plateau Pressure:  [14 cmH20] 14 cmH20   Intake/Output Summary (Last 24 hours) at 05/10/2023 0738 Last data filed at 05/10/2023 0545 Gross per 24 hour  Intake 2034.49 ml  Output 2885 ml  Net -850.51 ml   Filed Weights   05/08/23 0500 05/09/23 0416 05/10/23 0545  Weight: 75.4 kg 79.2 kg 78.3 kg    Examination: General: Intubated sedated able to follow minimal commands HENT: Supple neck reactive pupils Lungs: Diffuse rhonchi heard bilaterally Cardiovascular: Normal S1, normal S2, regular rate and rhythm Abdomen: Soft nontender nondistended positive bowel sounds Extremities: Warm well-perfused no edema  WBC today at 5.4. Plt stable. Kidney function intact. MRSA -. MSSA in BAL.    Assessment & Plan:  Case of a 52 year old male patient with a past medical history of bipolar disorder, Lewy body dementia with behavioral disturbances and significant psychotropic medications was on the floor since 11/28 with neuroleptic malignant syndrome now course complicated by acute hypoxic respiratory failure requiring intubation and mechanical ventilation now on 12/26 for aspiration pneumonia.  # 1 SEPTIC SHOCK - PRESENT ON ADMISSION - DUE TO PNEUMONIA  Acute  hypoxic respiratory failure secondary to MSSA PNEUMONIA            ROCEPHIN IV NARROW TO ANCEF  # 2Neuroleptic malignant syndrome             NEUROLOGY ON CASE -appreciate input :                       He had been on multiple psychotropic medicines including Lamotrigine 100 mg daily (discontinued 12/11) Topiramate 75 mg twice daily (discontinued 12/11) Clonazepam ordered as 1 mg twice daily as needed here Paroxetine 40 mg daily (discontinued 12/11) Valproic acid 2000 mg nightly (discontinued 12/11) Ingrezza 80 mg nightly (discontinued 12/11) Loxapine 25 mg 3 times daily (discontinued 12/11) Olanzapine (previously reported to be taking 10 mg in the morning and 20 mg at night or perhaps 30 mg nightly; reported not taking on 04/03/2023; has not been receiving this here)  secondary to psychotropic medications which are on hold currently on bromocriptine    #3 Pscyhiatric history - Lewy body dementia with behavioral disturbances and drug-induced parkinsonism currently on clonazepam and benztropine.           Difficult to perform SBT due to above  Poor prognosis , palliative care is following.  Patient very sick with severe comorbid history and very low quality of life. He has had prolonged hospitalization with minimal improvement.    Best Practice (right click and "Reselect all SmartList Selections" daily)   Diet/type: Restart tube feeds.  DVT prophylaxis prophylactic heparin  Pressure ulcer(s): N/A GI prophylaxis: PPI Lines: N/A Foley:  Yes, and it is still needed Code Status:  DNR (Palliative Team discussed with family members)   On going discussion with family members regarding QOL and care moving forward.    Critical care provider statement:   Total critical care time: 62 minutes   Performed by: Karna Christmas MD   Critical care time was exclusive of separately billable procedures and treating other patients.   Critical care was necessary to treat or prevent imminent or life-threatening deterioration.   Critical care was time spent  personally by me on the following activities: development of treatment plan with patient and/or surrogate as well as nursing, discussions with consultants, evaluation of patient's response to treatment, examination of patient, obtaining history from patient or surrogate, ordering and performing treatments and interventions, ordering and review of laboratory studies, ordering and review of radiographic studies, pulse oximetry and re-evaluation of patient's condition.    Vida Rigger, M.D.  Pulmonary & Critical Care Medicine

## 2023-05-10 NOTE — Progress Notes (Signed)
Eeg done 

## 2023-05-10 NOTE — Progress Notes (Signed)
NEUROLOGY CONSULT FOLLOW UP NOTE   Date of service: May 10, 2023 Patient Name: Jared Mayer MRN:  [id number] DOB:  02-Mar-1971  Brief HPI  Jared Mayer is a 52 y.o. male who presented with  past medical history significant for possible Lewy body dementia with behavioral disturbance, baseline moderate intellectual disability, mood disorder (schizoaffective disorder bipolar type), prior presentations for unintentional lamotrigine overdose, chronic hypotension on midodrine, hyperlipidemia, drug-induced parkinsonism, chronic right-sided weakness.   He had been haivng increasing problems with aggression and on 11/22 his loxapin was increased(40 for a 14 day supply). His CK was noted to be elevated and was attributed to rhabdomyolysis.   He was noted to have some disorientation and gait instability on 11/28 as well as significant aggression. He was found to be febrile at that time and started on abx. His loxapine was continued until 12/11.  His CK peaked on 12/10 at 1500.   On 12/11, psychiatry was reconsulted due to significant worsening with contorted face, rigid extremities. It was at this time that NMS was considered and loxapine was stopped.   He had been on multiple psychotropic medicines including Lamotrigine 100 mg daily (discontinued 12/11) Topiramate 75 mg twice daily (discontinued 12/11) Clonazepam ordered as 1 mg twice daily as needed here Paroxetine 40 mg daily (discontinued 12/11) Valproic acid 2000 mg nightly (discontinued 12/11) Ingrezza 80 mg nightly (discontinued 12/11) Loxapine 25 mg 3 times daily (discontinued 12/11) Olanzapine (previously reported to be taking 10 mg in the morning and 20 mg at night or perhaps 30 mg nightly; reported not taking on 04/03/2023; has not been receiving this here)  Due to poor GI motility, he was started on metoclopramide 10mg  Q8H on 12/23. It was noted that the continued to have increased tone on 12/25 and therefore baclofen 5mg  BID was added.     I increased bromocriptine from 5mg  TID to QID on 12/18.    Interval Hx/subjective   EEG pending. Fevered overnight.  Vitals   Vitals:   05/10/23 1930 05/10/23 1945 05/10/23 2000 05/10/23 2015  BP: (!) 106/53 (!) 88/56 (!) 80/55   Pulse: (!) 101 (!) 102 99 94  Resp: 20 (!) 22 17 (!) 21  Temp: (!) 102.7 F (39.3 C) (!) 102.6 F (39.2 C) (!) 102.7 F (39.3 C) (!) 103.6 F (39.8 C)  TempSrc:      SpO2: 96% 95% 96% 96%  Weight:      Height:         Body mass index is 26.25 kg/m.  Physical Exam   General: in bed, intubated.   Neurologic Examination   MS: awakens to voice, follows commands to  lift amrms CN: crosses midline in both directions, fixates and tracks Motor: His tone is increased, he has bilateral foot flexion and wrist flexion.  The distribution is flexor more than extensor hypertonicity.   Sensory: responds to nox stim x 4.   Labs and Diagnostic Imaging   CBC:  Recent Labs  Lab 05/09/23 0409 05/10/23 0431  WBC 5.4 5.4  HGB 10.3* 9.7*  HCT 30.7* 29.0*  MCV 96.2 95.7  PLT 456* 434*    Basic Metabolic Panel:  Lab Results  Component Value Date   NA 136 05/10/2023   K 3.5 05/10/2023   CO2 24 05/10/2023   GLUCOSE 101 (H) 05/10/2023   BUN 13 05/10/2023   CREATININE 0.42 (L) 05/10/2023   CALCIUM 7.1 (L) 05/10/2023   GFRNONAA >60 05/10/2023   GFRAA >60 04/01/2018   Lipid Panel:  No results found for: "LDLCALC" HgbA1c: No results found for: "HGBA1C" Urine Drug Screen:     Component Value Date/Time   LABOPIA NONE DETECTED 04/03/2023 0020   COCAINSCRNUR NONE DETECTED 04/03/2023 0020   LABBENZ POSITIVE (A) 04/03/2023 0020   AMPHETMU NONE DETECTED 04/03/2023 0020   THCU NONE DETECTED 04/03/2023 0020   LABBARB NONE DETECTED 04/03/2023 0020    Alcohol Level     Component Value Date/Time   ETH <10 04/03/2023 0020   INR  Lab Results  Component Value Date   INR 1.1 04/09/2023   APTT No results found for: "APTT"  Impression   Jared Mayer is a 52 y.o. male who presented with fever, confusion, increased tone, elevated CK couple of weeks after increasing his loxapine dose.  There was some delay in recognition given multiple confounding factors (history of Lewy body dementia, intellectual disability, multiple medications).  Following cessation of loxapine, his CKs have improved.  His worsening mental status on 12/26 could have multiple etiologies including addition of baclofen to other sedating medications, recurrent NMS type symptomatology in the setting of metoclopramide being added, or simply aspiration.    With the duration of symptoms at this point, I felt looking for other etiologies would be prudent, though NMS with re-worsening due to reglan is still highest on my differential.  MRI brain and C-spine show no evidence of a cause of hypertonicity.  My suspicion is that the event 2 days ago was not a seizure, but difficult to be certain. EEG pending. With improving mental status, would continue to wean sedation,work towards extubation, and observe.  Recommendations  Continue clonazepam 2 mg every 6 hours Continue bromocriptine to 5 mg q6h Continue benztropine 2 mg 3 times daily Routine EEG today.  Neurology will continue to follow ______________________________________________________________________  Bing Neighbors, MD [facility] [phone]  If 7pm- 7am, please page neurology on call as listed in AMION.

## 2023-05-10 NOTE — Progress Notes (Signed)
Patient frequently bending and moving arms. Must stand at bedside to hold arm down to get an accurate blood pressure. Will continue to monitor and do as frequent as possible.

## 2023-05-10 NOTE — Progress Notes (Signed)
Palliative Care Progress Note, Assessment & Plan   Patient Name: Jared Mayer       Date: 05/10/2023 DOB: Aug 15, 1970  Age: 52 y.o. MRN#: [id number] Attending Physician: Vida Rigger, MD Primary Care Physician: [facility] Admit Date: 04/08/2023  Subjective: Patient is lying in bed, intubated and sedated.  No family or friends present during my visit.  HPI: 52 y.o. male  with past medical history of intellectual disability, schizoaffective disorder, drug-induced parkinsonism, HLD and hypothyroidism admitted from group home on 04/08/2023 with AMS, erratic behavior and recurrent falls.   Concern for neuroleptic malignant syndrome-has been started on bromocriptine.   12/22 NGT remains-discussions surrounding possible PEG placement due to high risk for aspiration and high risk for limited PO intake Per neuro recommendations, APAP d/c'd, considering sources of infection due to continued leukocytosis   Unfortunately developed acute respiratory failure requiring mechanical ventilation likely secondary to aspiration pneumonia on 12/26   12/29 started on Propofol yesterday due to concern for seizure activity possibly related to spike in temperature, Ceribel placed without any signs of seizure activity   Palliative Medicine consulted for assisting with goals of care conversations.  Summary of counseling/coordination of care: Extensive chart review completed prior to meeting patient including labs, vital signs, imaging, progress notes, orders, and available advanced directive documents from current and previous encounters.   After reviewing the patient's chart and assessing the patient at bedside, counseled with dayshift RN regards to plan of care.  Patient has not made any purposeful,  repetitive movements today.  After discussing patient with RN, I spoke with patient's [name] over the phone.  I attempted to elicit values and goals important to [place].  [name] shares that she does not want Cristion to suffer.  She shares she has been put through enough already.  She shares she has not been able to walk in over a month.  She says that if he is unable to work on his coloring books, listen to his favorite music, and watch TV then he is not having an acceptable quality of life.  She shares she does not want to use down to harsh but she does not want him to suffer.   She recalls discussions with Dr. Larinda Buttery form yesterday in which he mentioned a tracheostomy.  She says, "this is not for Reuel Boom".  She is adamant and clear that she would never want patient to accept a tracheostomy/PEG tube for long-term medical management.  Therapeutic silence, active listening, and emotional support provided.  We discussed that SPT has proven difficult.  More attempts to wean patient off of medications and attempt SBT/extubation will continue.  If attempts at SBT/extubation fail, we discussed one-way comfort extubation.  She became appropriately tearful.  She shares she understands that this may be a potential outcome.  She remains hopeful that SBT will work.  No change to plan of care at this time.    Boundary has been set that no trach should be placed.    I shared her that Dr. Karna Christmas is available should she want to speak with him in regards to patient's current plan of care.  She shares she is exhausted and overwhelmed.  She is currently working  in the ED and plans to go home to rest this evening.  She shares she will be able to visit tomorrow.  PMT continues to follow and will support patient and family throughout his hospitalization.  Physical Exam Vitals reviewed.  Constitutional:      Appearance: He is ill-appearing.  HENT:     Head: Normocephalic.  Cardiovascular:     Rate and  Rhythm: Normal rate.  Pulmonary:     Comments: MV Abdominal:     Palpations: Abdomen is soft.  Skin:    General: Skin is dry.             Total Time 50 minutes   Time spent includes: Detailed review of medical records (labs, imaging, vital signs), medically appropriate exam (mental status, respiratory, cardiac, skin), discussed with treatment team, counseling and educating patient, family and staff, documenting clinical information, medication management and coordination of care.  Samara Deist L. [name], DNP, FNP-BC Palliative Medicine Team

## 2023-05-10 NOTE — Progress Notes (Signed)
PHARMACY CONSULT NOTE  Pharmacy Consult for Electrolyte Monitoring and Replacement   Recent Labs: Potassium (mmol/L)  Date Value  05/10/2023 3.5   Magnesium (mg/dL)  Date Value  19/14/7829 1.9   Calcium (mg/dL)  Date Value  56/21/3086 7.1 (L)   Albumin (g/dL)  Date Value  57/84/6962 1.8 (L)   Phosphorus (mg/dL)  Date Value  95/28/4132 3.1   Sodium (mmol/L)  Date Value  05/10/2023 136   Assessment: 52 y/o male with h/o intellectual disability, schizoeffective disorder, bipolar, anxiety, depression, dementia, HLD, chronic constipation, hpothyroidism and drug-induced parkinsons who was admitted from group home on 04/08/2023 with AMS, erratic behavior and recurrent falls and was found to have Oropharyngeal dysphagia, SIRS, neurolept malignant syndrome and rhabdomyolysis. Pharmacy is asked to follow and replace electrolytes while in CCU  Nutrition: vital AF + FWF 30 mL every 4 hours  Goal of Therapy:  Electrolytes WNL  Plan:  --K 3.5, Kcl 40 mEq per tube x 1 --Mg 1.9, magnesium sulfate 2 g IV x 1 --Re-check electrolytes in AM  Tressie Ellis 05/10/2023 8:02 AM

## 2023-05-11 LAB — BASIC METABOLIC PANEL
Anion gap: 9 (ref 5–15)
BUN: 10 mg/dL (ref 6–20)
CO2: 23 mmol/L (ref 22–32)
Calcium: 7.8 mg/dL — ABNORMAL LOW (ref 8.9–10.3)
Chloride: 102 mmol/L (ref 98–111)
Creatinine, Ser: 0.51 mg/dL — ABNORMAL LOW (ref 0.61–1.24)
GFR, Estimated: >60 mL/min (ref >60)
Glucose, Bld: 111 mg/dL — ABNORMAL HIGH (ref 70–99)
Potassium: 3.9 mmol/L (ref 3.5–5.1)
Sodium: 134 mmol/L — ABNORMAL LOW (ref 135–145)

## 2023-05-11 LAB — CBC WITH DIFFERENTIAL/PLATELET
Abs Immature Granulocytes: 0.06 10*3/uL (ref 0.00–0.07)
Basophils Absolute: 0 10*3/uL (ref 0.0–0.1)
Basophils Relative: 0 %
Eosinophils Absolute: 0 10*3/uL (ref 0.0–0.5)
Eosinophils Relative: 1 %
HCT: 29.4 % — ABNORMAL LOW (ref 39.0–52.0)
Hemoglobin: 9.6 g/dL — ABNORMAL LOW (ref 13.0–17.0)
Immature Granulocytes: 1 %
Lymphocytes Relative: 27 %
Lymphs Abs: 1.4 10*3/uL (ref 0.7–4.0)
MCH: 32 pg (ref 26.0–34.0)
MCHC: 32.7 g/dL (ref 30.0–36.0)
MCV: 98 fL (ref 80.0–100.0)
Monocytes Absolute: 0.7 10*3/uL (ref 0.1–1.0)
Monocytes Relative: 12 %
Neutro Abs: 3.2 10*3/uL (ref 1.7–7.7)
Neutrophils Relative %: 59 %
Platelets: 454 10*3/uL — ABNORMAL HIGH (ref 150–400)
RBC: 3 MIL/uL — ABNORMAL LOW (ref 4.22–5.81)
RDW: 14.6 % (ref 11.5–15.5)
WBC: 5.4 10*3/uL (ref 4.0–10.5)
nRBC: 0 % (ref 0.0–0.2)

## 2023-05-11 LAB — GLUCOSE, CAPILLARY
Glucose-Capillary: 105 mg/dL — ABNORMAL HIGH (ref 70–99)
Glucose-Capillary: 106 mg/dL — ABNORMAL HIGH (ref 70–99)
Glucose-Capillary: 109 mg/dL — ABNORMAL HIGH (ref 70–99)
Glucose-Capillary: 93 mg/dL (ref 70–99)
Glucose-Capillary: 96 mg/dL (ref 70–99)

## 2023-05-11 LAB — MAGNESIUM: Magnesium: 2 mg/dL (ref 1.7–2.4)

## 2023-05-11 MED ORDER — DEXMEDETOMIDINE HCL IN NACL 400 MCG/100ML IV SOLN
0.0000 ug/kg/h | INTRAVENOUS | Status: DC
  Administered 2023-05-11: 0.4 ug/kg/h via INTRAVENOUS
  Filled 2023-05-11: qty 100

## 2023-05-11 MED ORDER — SODIUM CHLORIDE 0.9 % IV BOLUS
1000.0000 mL | Freq: Once | INTRAVENOUS | Status: AC
Start: 2023-05-11 — End: 2023-05-11
  Administered 2023-05-11: 1000 mL via INTRAVENOUS

## 2023-05-11 MED ORDER — POLYVINYL ALCOHOL 1.4 % OP SOLN: 1.0000 [drp] | Freq: Four times a day (QID) | OPHTHALMIC | Status: DC | PRN

## 2023-05-11 MED ORDER — GLYCOPYRROLATE 0.2 MG/ML IJ SOLN
0.2000 mg | INTRAMUSCULAR | Status: DC | PRN
  Administered 2023-05-11 – 2023-05-12 (×2): 0.2 mg via INTRAVENOUS
  Filled 2023-05-11 (×2): qty 1

## 2023-05-11 MED ORDER — MIDODRINE HCL 5 MG PO TABS
10.0000 mg | ORAL_TABLET | Freq: Three times a day (TID) | ORAL | Status: DC
  Administered 2023-05-11: 10 mg via NASOGASTRIC
  Filled 2023-05-11: qty 2

## 2023-05-11 MED ORDER — LORAZEPAM 2 MG/ML IJ SOLN
2.0000 mg | INTRAMUSCULAR | Status: DC | PRN
  Administered 2023-05-12: 2 mg via INTRAVENOUS
  Filled 2023-05-11: qty 1

## 2023-05-11 MED ORDER — MORPHINE BOLUS VIA INFUSION
5.0000 mg | INTRAVENOUS | Status: DC | PRN
  Administered 2023-05-11 – 2023-05-12 (×21): 5 mg via INTRAVENOUS

## 2023-05-11 MED ORDER — MORPHINE 100MG IN NS 100ML (1MG/ML) PREMIX INFUSION
1.0000 mg/h | INTRAVENOUS | Status: DC
  Administered 2023-05-11: 10 mg/h via INTRAVENOUS
  Administered 2023-05-11: 1 mg/h via INTRAVENOUS
  Administered 2023-05-12 (×2): 10 mg/h via INTRAVENOUS
  Filled 2023-05-11 (×4): qty 100

## 2023-05-11 NOTE — Progress Notes (Signed)
 Patient extubated to comfort care, per order.

## 2023-05-11 NOTE — Progress Notes (Addendum)
                                                     Palliative Care Progress Note, Assessment & Plan   Patient Name: Jared Mayer       Date: 05/11/2023 DOB: [birth year]  Age: 52 y.o. MRN#: [id number] Attending Physician: Jared Manna, MD Primary Care Physician: Jared Mayer, Jared Mayer Admit Date: 04/08/2023  Subjective: Patient lying in bed in no apparent distress.  He remains clinically ill.  He is unable to acknowledge my presence or make his wishes known.  No family or friends present during my visit.  HPI: 52 y.o. male  with past medical history of intellectual disability, schizoaffective disorder, drug-induced parkinsonism, HLD and hypothyroidism admitted from group home on 04/08/2023 with AMS, erratic behavior and recurrent falls.   Concern for neuroleptic malignant syndrome-has been started on bromocriptine .   12/22 NGT remains-discussions surrounding possible PEG placement due to high risk for aspiration and high risk for limited PO intake Per neuro recommendations, APAP d/c'd, considering sources of infection due to continued leukocytosis   Unfortunately developed acute respiratory failure requiring mechanical ventilation likely secondary to aspiration pneumonia on 12/26   12/29 started on Propofol  yesterday due to concern for seizure activity possibly related to spike in temperature, Ceribel placed without any signs of seizure activity   Palliative Medicine consulted for assisting with goals of care conversations.  Summary of counseling/coordination of care: Extensive chart review completed prior to meeting patient including labs, vital signs, imaging, progress notes, orders, and available advanced directive documents from current and previous encounters.   After reviewing the patient's chart and assessing the patient at bedside, I  counseled with dayshift RN, NP Jared Mayer, and attending Dr. Parris in regards to plan of care.     Patient's mother Jared Mayer plans to be bedside shortly to discuss next steps for patient.   Addendum 1245: As per chart review, CCM met with patient's mother at bedside, discussed patient's multiorgan failure and minimal chance for significant/meaningful recovery. Patient's mother Jared Mayer has decided to move forward with compassionate one-way extubation. Discussed via secure chat with CCM Dr. Parris and NP Jared Mayer. CCM managing extubation. No assistance from PMT needed at this time.   PMT remains available to patient/family throughout his hospitalization.   Physical Exam Vitals reviewed.  Constitutional:      General: He is not in acute distress.    Appearance: He is normal weight. He is ill-appearing.  HENT:     Head: Normocephalic.     Mouth/Throat:     Mouth: Mucous membranes are moist.  Pulmonary:     Comments: MV Abdominal:     Palpations: Abdomen is soft.  Skin:    General: Skin is warm and dry.  Neurological:     Comments: Non verbal             Total Time 25 minutes   Time spent includes: Detailed review of medical records (labs, imaging, vital signs), medically appropriate exam (mental status, respiratory, cardiac, skin), discussed with treatment team, counseling and educating patient, family and staff, documenting clinical information, medication management and coordination of care.  Jared L. Arvid, DNP, FNP-BC Palliative Medicine Team

## 2023-05-11 NOTE — Plan of Care (Signed)
  Problem: Fluid Volume: Goal: Hemodynamic stability will improve Outcome: Not Progressing   Problem: Clinical Measurements: Goal: Diagnostic test results will improve Outcome: Not Progressing Goal: Signs and symptoms of infection will decrease Outcome: Not Progressing   Problem: Respiratory: Goal: Ability to maintain adequate ventilation will improve Outcome: Not Progressing   Problem: Education: Goal: Knowledge of General Education information will improve Description: Including pain rating scale, medication(s)/side effects and non-pharmacologic comfort measures Outcome: Not Progressing   Problem: Health Behavior/Discharge Planning: Goal: Ability to manage health-related needs will improve Outcome: Not Progressing   Problem: Clinical Measurements: Goal: Ability to maintain clinical measurements within normal limits will improve Outcome: Not Progressing Goal: Will remain free from infection Outcome: Not Progressing Goal: Diagnostic test results will improve Outcome: Not Progressing Goal: Respiratory complications will improve Outcome: Not Progressing Goal: Cardiovascular complication will be avoided Outcome: Not Progressing   Problem: Activity: Goal: Risk for activity intolerance will decrease Outcome: Not Progressing   Problem: Nutrition: Goal: Adequate nutrition will be maintained Outcome: Not Progressing   Problem: Coping: Goal: Level of anxiety will decrease Outcome: Not Progressing   Problem: Elimination: Goal: Will not experience complications related to bowel motility Outcome: Not Progressing Goal: Will not experience complications related to urinary retention Outcome: Not Progressing   Problem: Pain Management: Goal: General experience of comfort will improve Outcome: Not Progressing   Problem: Safety: Goal: Ability to remain free from injury will improve Outcome: Not Progressing   Problem: Skin Integrity: Goal: Risk for impaired skin integrity  will decrease Outcome: Not Progressing

## 2023-05-11 NOTE — Procedures (Signed)
 Routine EEG Report  Jared Mayer is a 52 y.o. male with a history of seizure who is undergoing an EEG to evaluate for seizures.  Report: This EEG was acquired with electrodes placed according to the International 10-20 electrode system (including Fp1, Fp2, F3, F4, C3, C4, P3, P4, O1, O2, T3, T4, T5, T6, A1, A2, Fz, Cz, Pz). The following electrodes were missing or displaced: none.  The occipital dominant rhythm was 3-6 Hz. This activity is reactive to stimulation. Drowsiness was manifested by background fragmentation; deeper stages of sleep were identified by K complexes and sleep spindles. There was no focal slowing. There were no interictal epileptiform discharges. There were no electrographic seizures identified.  Impression and clinical correlation: This EEG was obtained while awake and asleep and is abnormal due to moderate-to-severe diffuse slowing indicative of global cerebral dysfunction. Epileptiform abnormalities were not seen during this recording.  Elida Ross, MD [facility] [phone]  If 7pm- 7am, please page neurology on call as listed in AMION.

## 2023-05-11 NOTE — IPAL (Signed)
 GOALS OF CARE FAMILY CONFERENCE   Current clinical status, hospital findings and medical plan was reviewed with family.   Updated and notified of patients ongoing immediate critical medical problems.   Patient remains severely encephalopathic.  Patient is unable to breathe independently, unable to protect airway and unable to mobilize secretions.    Mother of patient Bunny) at bedside we discussed his chronic conditions and she reports that patient would not wish to have aggressive measures and would not want to have prolonged intubation, declines tracheostomy or further care. She requests compassionate extubation on comfort measures.   Patient with Progressive multiorgan failure with high probability of a very minimal chance of meaningful recovery despite aggressive and optimal medical therapy.   Family is appreciative of care and relate understanding that patient is severely critically ill with anticipation of passing away during this hospitalization.   They have consented and agreed to DNR/DNI  COMFORT CARE Code status   Family are satisfied with Plan of action and management. All questions answered  Additional Critical Care time 35 mins    Halina Picking, M.D.  Pulmonary & Critical Care Medicine  [name]

## 2023-05-11 NOTE — Plan of Care (Signed)
 Family has elected to proceed with compassionate extubation. Neurology will be available prn if we can be of any further assistance.  Jared Ross, MD [facility] [phone]  If 7pm- 7am, please page neurology on call as listed in AMION.

## 2023-05-11 NOTE — Progress Notes (Signed)
 PHARMACY CONSULT NOTE  Pharmacy Consult for Electrolyte Monitoring and Replacement   Recent Labs: Potassium (mmol/L)  Date Value  05/11/2023 3.9   Magnesium  (mg/dL)  Date Value  87/68/7975 2.0   Calcium  (mg/dL)  Date Value  87/68/7975 7.8 (L)   Albumin (g/dL)  Date Value  87/70/7975 1.8 (L)   Phosphorus (mg/dL)  Date Value  87/70/7975 3.1   Sodium (mmol/L)  Date Value  05/11/2023 134 (L)   Assessment: 52 y/o male with h/o intellectual disability, schizoeffective disorder, bipolar, anxiety, depression, dementia, HLD, chronic constipation, hpothyroidism and drug-induced parkinsons who was admitted from group home on 04/08/2023 with AMS, erratic behavior and recurrent falls and was found to have Oropharyngeal dysphagia, SIRS, neurolept malignant syndrome and rhabdomyolysis. Pharmacy is asked to follow and replace electrolytes while in CCU  Nutrition: vital AF + FWF 30 mL every 4 hours  Goal of Therapy:  Electrolytes WNL  Plan:  --No electrolyte replacement indicated at this time --Re-check electrolytes in AM  Marolyn KATHEE Mare 05/11/2023 7:35 AM

## 2023-05-11 NOTE — Progress Notes (Signed)
 NAME:  Jared Mayer, MRN:  [id number], DOB:  December 22, 1970, LOS: 33 ADMISSION DATE:  04/08/2023  History of Present Illness:  This is a case of a 52 year old male patient with a past medical history of Lewy body dementia with behavioral disturbances, baseline moderate intellectual disability, mood disorder, prior presentation for an intentional lamotrigine  overdose, chronic hypotension on midodrine , drug-induced parkinsonism who presented initially to [facility] with increased combative behavior from assisted living.  His medications were adjusted and he was discharged back to assisted living.  However on 11/28 a day after discharge he presents back with gait instability disorientation and repetitive speech pattern as well as erratic behavior.  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  Therefore his psychotropic medications were stopped.  Unfortunately course complicated by acute hypoxic respiratory failure overnight requiring intubation and mechanical ventilation.  Thought to be secondary to an aspiration event.  The only changes in medications was starting baclofen  yesterday.  He has been on benztropine , clonazepam  and bromocriptine .  Chest x-ray shows high endotracheal tube that was advanced by 2 cm, left IJ in place.  No significant parenchymal lung disease.  OG tube in place.  Significant Hospital Events: Including procedures, antibiotic start and stop dates in addition to other pertinent events   05/06/2023 intubated and mechanically ventilated for hypoxic respiratory failure secondary to aspiration event. 05/07/2023 Remains intubated, barely following commands. S/p bronchoscopy 12/26 culture negative so far. DNR.  05/08/2023 spiked a fever to 101 leading to seizure episode. Deeply sedated. Ceribel without any signs of seizure.  05/09/2023 Spiked another fever of 102. BAL noted to be + for Staph  aureus. Will restart ceftriaxone  2gm for 7 days.  05/11/23- patient remains critically ill, family meeting today for goals of care with possible compassionate liberation from artificial life support.  Remains on levophed  and sedation.     Objective   Blood pressure (!) 88/58, pulse (!) 51, temperature 97.9 F (36.6 C), resp. rate 20, height 5' 7.99 (1.727 m), weight 78.3 kg, SpO2 98%.    Vent Mode: PRVC FiO2 (%):  [28 %] 28 % Set Rate:  [20 bmp] 20 bmp Vt Set:  [500 mL] 500 mL PEEP:  [5 cmH20] 5 cmH20 Plateau Pressure:  [16 cmH20-19 cmH20] 16 cmH20   Intake/Output Summary (Last 24 hours) at 05/11/2023 1018 Last data filed at 05/11/2023 0800 Gross per 24 hour  Intake 3453.51 ml  Output 3705 ml  Net -251.49 ml   Filed Weights   05/08/23 0500 05/09/23 0416 05/10/23 0545  Weight: 75.4 kg 79.2 kg 78.3 kg    Examination: General: Intubated sedated able to follow minimal commands HENT: Supple neck reactive pupils Lungs: Diffuse rhonchi heard bilaterally Cardiovascular: Normal S1, normal S2, regular rate and rhythm Abdomen: Soft nontender nondistended positive bowel sounds Extremities: Warm well-perfused no edema  WBC today at 5.4. Plt stable. Kidney function intact. MRSA -. MSSA in BAL.    Assessment & Plan:  Case of a 52 year old male patient with a past medical history of bipolar disorder, Lewy body dementia with behavioral disturbances and significant psychotropic medications was on the floor since 11/28 with neuroleptic malignant syndrome now course complicated by acute hypoxic respiratory failure requiring intubation and mechanical ventilation now on 12/26 for aspiration pneumonia.  # 1 SEPTIC SHOCK - PRESENT ON ADMISSION - DUE TO PNEUMONIA  Acute hypoxic respiratory failure secondary to MSSA PNEUMONIA  ROCEPHIN  IV NARROW TO ANCEF   # 2Neuroleptic malignant syndrome             NEUROLOGY ON CASE -appreciate input :                       He had been on multiple  psychotropic medicines including Lamotrigine  100 mg daily (discontinued 12/11) Topiramate  75 mg twice daily (discontinued 12/11) Clonazepam  ordered as 1 mg twice daily as needed here Paroxetine  40 mg daily (discontinued 12/11) Valproic acid  2000 mg nightly (discontinued 12/11) Ingrezza  80 mg nightly (discontinued 12/11) Loxapine  25 mg 3 times daily (discontinued 12/11) Olanzapine  (previously reported to be taking 10 mg in the morning and 20 mg at night or perhaps 30 mg nightly; reported not taking on 04/03/2023; has not been receiving this here)  secondary to psychotropic medications which are on hold currently on bromocriptine     #3 Pscyhiatric history - Lewy body dementia with behavioral disturbances and drug-induced parkinsonism currently on clonazepam  and benztropine .           Difficult to perform SBT due to above  Poor prognosis , palliative care is following.  Patient very sick with severe comorbid history and very low quality of life. He has had prolonged hospitalization with minimal improvement.    Best Practice (right click and Reselect all SmartList Selections daily)   Diet/type: Restart tube feeds.  DVT prophylaxis prophylactic heparin  Pressure ulcer(s): N/A GI prophylaxis: PPI Lines: N/A Foley:  Yes, and it is still needed Code Status:  DNR (Palliative Team discussed with family members)   On going discussion with family members regarding QOL and care moving forward.    Critical care provider statement:   Total critical care time: 62 minutes   Performed by: Parris MD   Critical care time was exclusive of separately billable procedures and treating other patients.   Critical care was necessary to treat or prevent imminent or life-threatening deterioration.   Critical care was time spent personally by me on the following activities: development of treatment plan with patient and/or surrogate as well as nursing, discussions with consultants, evaluation of  patient's response to treatment, examination of patient, obtaining history from patient or surrogate, ordering and performing treatments and interventions, ordering and review of laboratory studies, ordering and review of radiographic studies, pulse oximetry and re-evaluation of patient's condition.    Mariona Scholes, M.D.  Pulmonary & Critical Care Medicine

## 2023-05-11 NOTE — Plan of Care (Signed)
Comfort care patient.  Problem: Pain Management: Goal: General experience of comfort will improve Outcome: Progressing

## 2023-05-12 DIAGNOSIS — Z515 Encounter for palliative care: Secondary | ICD-10-CM | POA: Diagnosis not present

## 2023-05-12 MED ORDER — GLYCOPYRROLATE 0.2 MG/ML IJ SOLN
0.4000 mg | INTRAMUSCULAR | Status: DC | PRN
  Administered 2023-05-12 – 2023-05-13 (×4): 0.4 mg via INTRAVENOUS
  Filled 2023-05-12 (×4): qty 2

## 2023-05-12 MED ORDER — HYDROMORPHONE HCL 1 MG/ML IJ SOLN
1.0000 mg | INTRAMUSCULAR | Status: DC | PRN
  Administered 2023-05-12: 4 mg via INTRAVENOUS
  Filled 2023-05-12: qty 4

## 2023-05-12 MED ORDER — HYDROMORPHONE BOLUS VIA INFUSION
1.0000 mg | INTRAVENOUS | Status: DC | PRN
  Administered 2023-05-12 – 2023-05-13 (×7): 1 mg via INTRAVENOUS

## 2023-05-12 MED ORDER — LORAZEPAM 2 MG/ML IJ SOLN
2.0000 mg | INTRAMUSCULAR | Status: DC | PRN
  Administered 2023-05-12 – 2023-05-13 (×6): 2 mg via INTRAVENOUS
  Filled 2023-05-12 (×6): qty 1

## 2023-05-12 MED ORDER — HYDROMORPHONE HCL 1 MG/ML IJ SOLN
2.0000 mg | INTRAMUSCULAR | Status: DC | PRN
  Administered 2023-05-12: 2 mg via INTRAVENOUS
  Filled 2023-05-12: qty 2

## 2023-05-12 MED ORDER — HYDROMORPHONE HCL-NACL 50-0.9 MG/50ML-% IV SOLN
1.0000 mg/h | INTRAVENOUS | Status: DC
  Administered 2023-05-12: 1 mg/h via INTRAVENOUS
  Administered 2023-05-13: 4 mg/h via INTRAVENOUS
  Administered 2023-05-13: 5 mg/h via INTRAVENOUS
  Administered 2023-05-13: 2 mg/h via INTRAVENOUS
  Filled 2023-05-12 (×3): qty 50

## 2023-05-12 NOTE — Progress Notes (Signed)
  Progress Note   Patient: Jared Mayer FMW:[id number] DOB: October 11, 1970 DOA: 04/08/2023     34 DOS: the patient was seen and examined on 05/12/2023   Brief hospital course: 52yo with h/o Lewy body dementia, ID, and mood d/o who presented on 11/28 with intentional lamotrigine  overdose resulting in neuroleptic malignant syndrome.  His course was complicated by acute hypoxic respiratory failure requiring intubation/mechanical ventilation due to aspiration event with septic shock associated with MSSA PNA.  Based on sustained severe encephalopathy and respiratory failure, GOC conversation led to the decision for comfort care with compassionate extubation.  Assessment and Plan:  End of life care Patient with prolonged hospitalization for neuroleptic malignant syndrome -> aspiration PNA from MSSA -> septic shock Based on poor overall prognosis, family has decided to proceed with comfort care only Will transfer to palliative floor for comfort care and palliative care consult Comfort care order set utilized Pain control with morphine  drip    Consultants: PCCM Neurology ID Psychiatry IR Surgery Palliative Care PT OT SLP Nutrition TOC team  Procedures: Intubation 12/26 Bronchoscopy 12/27 Echocardiogram 12/9 EEG 12/11, 12/18, 12/29     Subjective: Agonal breathing, obtunded.   Objective: Vitals:   05/12/23 0800 05/12/23 0900  BP:    Pulse: (!) 121 (!) 123  Resp: (!) 21 19  Temp: (!) 103.7 F (39.8 C) (!) 103.8 F (39.9 C)  SpO2: (!) 28% (!) 31%    Intake/Output Summary (Last 24 hours) at 05/12/2023 1231 Last data filed at 05/12/2023 0949 Gross per 24 hour  Intake 647.15 ml  Output --  Net 647.15 ml   Filed Weights   05/08/23 0500 05/09/23 0416 05/10/23 0545  Weight: 75.4 kg 79.2 kg 78.3 kg    Exam:  General:  Appears critically ill, obtunded, agonal breathing Eyes:  normal lids, closed throughout ENT:  grossly normal lips & tongue Cardiovascular:  RR with  tachycardia Respiratory:   Diffuse rhonchi.  Agonal respiratory effort. Abdomen:  soft, NT, ND Skin:  no rash or induration seen on limited exam Musculoskeletal:  no bony abnormality Psychiatric:  obtunded Neurologic:  unable to perform  Data Reviewed: I have reviewed the patient's lab results since admission.  Pertinent labs for today include:   None     Family Communication: None present  Disposition: Status is: Inpatient Remains inpatient appropriate because: actively dying     Time spent: 35 minutes  Unresulted Labs (From admission, onward)    None        Author: Delon Herald, MD 05/12/2023 12:31 PM  For on call review [email or web address].

## 2023-05-12 NOTE — Progress Notes (Addendum)
 Palliative Care Progress Note, Assessment & Plan   Patient Name: Jared Mayer       Date: 05/12/2023 DOB: 1970-08-25  Age: 53 y.o. MRN#: [id number] Attending Physician: Barbarann Nest, MD Primary Care Physician: [facility] Admit Date: 04/08/2023  Subjective: Patient is lying in bed, agonal breathing, and appears to be actively dying.  His mother, father, and other family MERS are present at bedside during my visit.  HPI: 53yo with h/o Lewy body dementia, ID, and mood d/o who presented on 11/28 with intentional lamotrigine  overdose resulting in neuroleptic malignant syndrome. His course was complicated by acute hypoxic respiratory failure requiring intubation/mechanical ventilation due to aspiration event with septic shock associated with MSSA PNA. Based on sustained severe encephalopathy and respiratory failure, GOC conversation led to the decision for comfort care with compassionate extubation.   Summary of counseling/coordination of care: Extensive chart review completed prior to meeting patient including labs, vital signs, imaging, progress notes, orders, and available advanced directive documents from current and previous encounters.   After reviewing the patient's chart and assessing the patient at bedside, I spoke with patient's family in regards to symptom management.  Patient is having agonal breathing.  No brow furrowing, grimacing, or moaning noted.  Family suggest that he is struggling but not able to communicate this.  Reviewed MAR.  Adjusted orders so that Ativan  can be given more liberally.  Additionally, given the patient has max dose of morphine  with 5 mg boluses, I recommend Dilaudid  as needed for additional pain and air hunger management.  Education provided to family in  regards to agonal breathing and use of medications to relieve suffering at end-of-life.  Therapeutic silence, active listening, and emotional support provided.  Questions and concerns were addressed.  Counseled with dayshift RN in regards to medication adjustments.  PMT will remain available to patient and family throughout his hospitalization. Full comfort measures continue.  I anticipate an in-hospital death.  Addendum: I returned to bedside in the afternoon to assess patient.  He appears more comfortable with less labored breathing.  Secretions are not as audible.  Counseled with RN.  RN shares patient seems to be more comfortable with Dilaudid  pushes.  Given that his morphine  gtt. bag is about to run out, I ordered a Dilaudid  drip.  Patient to be converted from morphine  to Dilaudid  drip with Dilaudid  boluses as needed.  Advised RN to discontinue morphine  drip, morphine  boluses, and Dilaudid  pushes once Dilaudid  drip is available and ready to be administered.  Physical Exam Vitals reviewed.  Constitutional:      Appearance: He is ill-appearing.  HENT:     Head: Normocephalic.  Pulmonary:     Comments: Agonal breathing Abdominal:     Palpations: Abdomen is soft.  Skin:    General: Skin is warm.     Coloration: Skin is pale.             Total Time 90 minutes   Time spent includes: Detailed review of medical records (labs, imaging, vital signs), medically appropriate exam (mental status, respiratory, cardiac, skin), discussed with treatment team, counseling and educating patient, family and staff, documenting clinical information, medication management and coordination of care.  Lamarr L. Ekaterina Denise,  DNP, FNP-BC Palliative Medicine Team

## 2023-05-12 NOTE — TOC Progression Note (Signed)
 Transition of Care [facility]) - Progression Note    Patient Details  Name: Jared Mayer MRN: [id number] Date of Birth: 01-05-1971  Transition of Care [facility]) CM/SW Contact  Lauraine [name], LCSW Phone Number: 05/12/2023, 3:53 PM  Clinical Narrative:   Patient now on comfort measures.   Expected Discharge Plan: Assisted Living Barriers to Discharge: Continued Medical Work up  Expected Discharge Plan and Services       Living arrangements for the past 2 months: Assisted Living Facility                                       Social Determinants of Health (SDOH) Interventions SDOH Screenings   Food Insecurity: No Food Insecurity (04/12/2023)  Housing: Patient Unable To Answer (04/12/2023)  Transportation Needs: Patient Unable To Answer (04/12/2023)  Utilities: Patient Unable To Answer (04/12/2023)  Financial Resource Strain: Low Risk  (03/29/2018)  Physical Activity: Unknown (03/29/2018)  Social Connections: Unknown (03/29/2018)  Stress: No Stress Concern Present (03/29/2018)  Tobacco Use: Low Risk  (04/08/2023)    Readmission Risk Interventions     No data to display

## 2023-05-12 NOTE — Progress Notes (Signed)
 Mother notified patient to transfer to room 108.

## 2023-05-12 NOTE — Progress Notes (Addendum)
 Patient received from ICU  via bed. He is unresponsive. He has been getting dilaudid 1 mg/hrs.he seems have  air hunger, inj dilaudid 2 mg bolus given as order. Plan of care ongoing.

## 2023-05-12 NOTE — Hospital Course (Addendum)
 52yo with h/o Lewy body dementia, ID, and mood d/o who presented on 11/28 with intentional lamotrigine  overdose resulting in neuroleptic malignant syndrome.  His course was complicated by acute hypoxic respiratory failure requiring intubation/mechanical ventilation due to aspiration event with septic shock associated with MSSA PNA.  Based on sustained severe encephalopathy and respiratory failure, GOC conversation led to the decision for comfort care with compassionate extubation on 12/31.

## 2023-05-12 NOTE — Plan of Care (Signed)
 Patient remains on comfort care.   Problem: Fluid Volume: Goal: Hemodynamic stability will improve Outcome: Not Progressing   Problem: Clinical Measurements: Goal: Diagnostic test results will improve Outcome: Not Progressing Goal: Signs and symptoms of infection will decrease Outcome: Not Progressing   Problem: Respiratory: Goal: Ability to maintain adequate ventilation will improve Outcome: Not Progressing   Problem: Education: Goal: Knowledge of General Education information will improve Description: Including pain rating scale, medication(s)/side effects and non-pharmacologic comfort measures Outcome: Not Progressing   Problem: Health Behavior/Discharge Planning: Goal: Ability to manage health-related needs will improve Outcome: Not Progressing   Problem: Clinical Measurements: Goal: Ability to maintain clinical measurements within normal limits will improve Outcome: Not Progressing Goal: Will remain free from infection Outcome: Not Progressing Goal: Diagnostic test results will improve Outcome: Not Progressing Goal: Respiratory complications will improve Outcome: Not Progressing Goal: Cardiovascular complication will be avoided Outcome: Not Progressing   Problem: Activity: Goal: Risk for activity intolerance will decrease Outcome: Not Progressing   Problem: Nutrition: Goal: Adequate nutrition will be maintained Outcome: Not Progressing   Problem: Coping: Goal: Level of anxiety will decrease Outcome: Not Progressing   Problem: Elimination: Goal: Will not experience complications related to bowel motility Outcome: Not Progressing Goal: Will not experience complications related to urinary retention Outcome: Not Progressing   Problem: Pain Management: Goal: General experience of comfort will improve Outcome: Not Progressing   Problem: Safety: Goal: Ability to remain free from injury will improve Outcome: Not Progressing   Problem: Skin Integrity: Goal:  Risk for impaired skin integrity will decrease Outcome: Not Progressing

## 2023-05-12 DEATH — deceased

## 2023-05-13 DIAGNOSIS — Z7189 Other specified counseling: Secondary | ICD-10-CM | POA: Diagnosis not present

## 2023-05-13 MED ORDER — ONDANSETRON HCL 4 MG/2ML IJ SOLN
4.0000 mg | Freq: Four times a day (QID) | INTRAMUSCULAR | Status: DC | PRN
  Administered 2023-05-13: 4 mg via INTRAVENOUS
  Filled 2023-05-13: qty 2

## 2023-05-13 NOTE — Plan of Care (Signed)
 At approx 03:30, called pt's mother to let her know that I and another RN had observed a change in his breathing.  Ativan   and a bolus of dilaudid  was given for air hunger.  Increased the dilaudid  drip to 3 mg/hr. When we repositioned him trying to improve his breathing, he opened his eyes but did not respond.

## 2023-05-13 NOTE — TOC Progression Note (Signed)
 Transition of Care [facility]) - Progression Note    Patient Details  Name: Jared Mayer MRN: [id number] Date of Birth: 09/01/1970  Transition of Care [facility]) CM/SW Contact  Serria Sloma A Aniayah Alaniz, RN Phone Number: 05/13/2023, 11:09 AM  Clinical Narrative:    Chart reviewed.  Patient remains non-responsive. Patient is on comfort care.  Hospital death anticipated.      Expected Discharge Plan: Assisted Living Barriers to Discharge: Continued Medical Work up  Expected Discharge Plan and Services       Living arrangements for the past 2 months: Assisted Living Facility                                       Social Determinants of Health (SDOH) Interventions SDOH Screenings   Food Insecurity: No Food Insecurity (04/12/2023)  Housing: Patient Unable To Answer (04/12/2023)  Transportation Needs: Patient Unable To Answer (04/12/2023)  Utilities: Patient Unable To Answer (04/12/2023)  Financial Resource Strain: Low Risk  (03/29/2018)  Physical Activity: Unknown (03/29/2018)  Social Connections: Unknown (03/29/2018)  Stress: No Stress Concern Present (03/29/2018)  Tobacco Use: Low Risk  (04/08/2023)    Readmission Risk Interventions     No data to display

## 2023-05-13 NOTE — Progress Notes (Signed)
  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  His course was complicated by acute hypoxic respiratory failure requiring intubation/mechanical ventilation due to aspiration event with septic shock associated with MSSA PNA.  Based on sustained severe encephalopathy and respiratory failure, GOC conversation led to the decision for comfort care with compassionate extubation on 12/31.  Assessment and Plan:  End of life care Patient with prolonged hospitalization for neuroleptic malignant syndrome -> aspiration PNA from MSSA -> septic shock Based on poor overall prognosis, family has decided to proceed with comfort care only Will transfer to palliative floor for comfort care and palliative care consult Comfort care order set utilized Pain control with Dilaudid  drip       Consultants: PCCM Neurology ID Psychiatry IR Surgery Palliative Care PT OT SLP Nutrition TOC team   Procedures: Intubation 12/26-31 Bronchoscopy 12/27 Echocardiogram 12/9 EEG 12/11, 12/18, 12/29       Subjective: Family present, concerned that he is still grimacing and uncomfortable   Objective: Vitals:   05/13/23 0610 05/13/23 0734  BP: (!) 96/58 (!) 101/56  Pulse: (!) 132 (!) 134  Resp: 14 18  Temp: 98 F (36.7 C) 98.2 F (36.8 C)  SpO2: (!) 10% (!) 27%    Intake/Output Summary (Last 24 hours) at 05/13/2023 1435 Last data filed at 05/13/2023 1100 Gross per 24 hour  Intake 65.51 ml  Output 1000 ml  Net -934.49 ml   Filed Weights   05/08/23 0500 05/09/23 0416 05/10/23 0545  Weight: 75.4 kg 79.2 kg 78.3 kg    Exam:  General:  Appears critically ill, obtunded, agonal breathing Eyes:  normal lids, closed  throughout ENT:  grossly normal lips & tongue Cardiovascular:  RR with tachycardia Respiratory:   Diffuse rhonchi.  Agonal respiratory effort. Abdomen:  soft, NT, ND Skin:  no rash or induration seen on limited exam Musculoskeletal:  no bony abnormality Psychiatric:  obtunded Neurologic:  unable to perform  Data Reviewed: I have reviewed the patient's lab results since admission.  Pertinent labs for today include:   None     Family Communication: Mother and sister were present  Disposition: Status is: Inpatient Remains inpatient appropriate because: actively dying     Time spent: 25 minutes  Unresulted Labs (From admission, onward)    None        Author: Delon Herald, MD 05/13/2023 2:35 PM  For on call review [email or web address].

## 2023-05-13 NOTE — Progress Notes (Signed)
 Daily Progress Note   Patient Name: Jared Mayer       Date: 05/13/2023 DOB: Jun 14, 1970  Age: 53 y.o. MRN#: [id number] Attending Physician: Barbarann Nest, MD Primary Care Physician: [place] Admit Date: 04/08/2023  Reason for Consultation/Follow-up: Establishing goals of care  Subjective: Notes reviewed. In to see patient. Patient is non-responsive with regular breathing noted. Comfort orders in place. Anticipate hospital death.   Length of Stay: 35  Current Medications: Scheduled Meds:    Continuous Infusions:  HYDROmorphone  3 mg/hr (05/13/23 0403)    PRN Meds: glycopyrrolate , HYDROmorphone , LORazepam , polyvinyl alcohol , triamcinolone  0.1 % cream : eucerin  Physical Exam Constitutional:      Comments: Eyes closed.   Pulmonary:     Effort: Pulmonary effort is normal.             Vital Signs: BP (!) 101/56 (BP Location: Left Arm)   Pulse (!) 134   Temp 98.2 F (36.8 C)   Resp 18   Ht 5' 7.99 (1.727 m)   Wt 78.3 kg   SpO2 (!) 27%   BMI 26.25 kg/m  SpO2: SpO2: (!) 27 % O2 Device: O2 Device: Room Air O2 Flow Rate: O2 Flow Rate (L/min): 15 L/min  Intake/output summary:  Intake/Output Summary (Last 24 hours) at 05/13/2023 1101 Last data filed at 05/13/2023 9385 Gross per 24 hour  Intake 109.55 ml  Output 600 ml  Net -490.45 ml   LBM: Last BM Date : 05/03/23 Baseline Weight: Weight: 77.4 kg Most recent weight: Weight: 78.3 kg   Patient Active Problem List   Diagnosis Date Noted   Acute hypoxic respiratory failure (HCC) 05/06/2023   Hypophosphatemia 04/23/2023   Hypokalemia 04/23/2023   Dyslipidemia 04/08/2023   Hypothyroidism 04/08/2023   Anxiety and depression 04/08/2023   Rhabdomyolysis 04/08/2023   Sepsis without acute organ dysfunction  (HCC) 04/08/2023   Multiple falls 04/08/2023   Aggressive behavior 04/03/2023   Dementia (HCC)    Accidental overdose 03/13/2021   Altered mental status 03/11/2021   Schizoaffective disorder, bipolar type (HCC) 03/11/2021   Accidental overdose of lamotrigine  03/11/2021   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 Anticipate  hospital death.   Code Status:    Code Status Orders  (From admission, onward)           Start     Ordered   05/11/23 1550  Do not attempt resuscitation (DNR) - Comfort care  Continuous       Question Answer Comment  If patient has no pulse and is not breathing Do Not Attempt Resuscitation   In Pre-Arrest Conditions (Patient Is Breathing and Has a Pulse) Provide comfort measures. Relieve any mechanical airway obstruction. Avoid transfer unless required for comfort.   Consent: Discussion documented in EHR or advanced directives reviewed      05/11/23 1550           Code Status History     Date Active Date Inactive Code Status Order ID Comments User Context   05/06/2023 1449 05/11/2023 1550 Do not attempt resuscitation (DNR) PRE-ARREST INTERVENTIONS DESIRED [id number]  Marvis Camellia LABOR, NP Inpatient   04/08/2023 2309 05/06/2023 1449 Full Code [id number]  Mansy, Madison LABOR, MD ED   03/11/2021 1549 03/14/2021 0039 Full Code [id number]  Maree Hue, MD ED   03/29/2018 2156 04/01/2018 1427 Full Code [id number]  Runell Redia PARAS, MD Inpatient       Prognosis:  Hours - Days   Thank you for allowing the Palliative Medicine Team to assist in the care of this patient.    Camelia Lewis, NP  Please contact Palliative Medicine Team phone at [phone] for questions and concerns.

## 2023-05-14 DIAGNOSIS — G210 Malignant neuroleptic syndrome: Secondary | ICD-10-CM | POA: Diagnosis present

## 2023-05-14 LAB — CULTURE, BLOOD (ROUTINE X 2)
Culture: NO GROWTH
Culture: NO GROWTH
Special Requests: ADEQUATE

## 2023-06-12 NOTE — Death Summary Note (Addendum)
 DEATH SUMMARY   Patient Details  Name: Jared Mayer MRN: [id number] DOB: [birth year] ERE:Ynldzrjood, Doctors Making Admission/Discharge Information   Admit Date:  04/24/2023  Date of Death: Date of Death: May 30, 2023  Time of Death: Time of Death: 0240  Length of Stay: 03-May-2035   Principle Cause of death: Neuroleptic malignant syndrome  Hospital Diagnoses: Active Problems:   Schizoaffective disorder, bipolar type (HCC)   Septic shock with acute organ dysfunction due to methicillin susceptible Staphylococcus aureus (MSSA) (HCC)   Acute hypoxic respiratory failure (HCC)   Neuroleptic malignant syndrome   Hospital Course: 53yo with h/o Lewy body dementia, ID, and mood d/o who presented on 04-23-2024 with intentional lamotrigine  overdose resulting in neuroleptic malignant syndrome.  His course was complicated by acute hypoxic respiratory failure requiring intubation/mechanical ventilation due to aspiration event with septic shock associated with MSSA PNA.  Based on sustained severe encephalopathy and respiratory failure, GOC conversation led to the decision for comfort care with compassionate extubation on 12/31.  Assessment and Plan:  End of life care Patient with prolonged hospitalization for neuroleptic malignant syndrome -> aspiration PNA from MSSA -> septic shock Based on poor overall prognosis, family has decided to proceed with comfort care only Transferred to palliative floor for comfort care and palliative care consult Comfort care order set utilized Pain controlled with Dilaudid  drip Patient died peacefully in the early morning hours of May 30, 2023       Consultants: PCCM Neurology ID Psychiatry IR Surgery Palliative Care PT OT SLP Nutrition TOC team   Procedures: Intubation 12/26-31 Bronchoscopy 12/27 Echocardiogram 12/9 EEG 12/11, 12/18, 12/29  The results of significant diagnostics from this hospitalization (including imaging, microbiology, ancillary and  laboratory) are listed below for reference.   Significant Diagnostic Studies: EEG adult Result Date: 05/11/2023 Matthews Elida HERO, MD     05/11/2023  3:18 PM Routine EEG Report Jared Mayer is a 53 y.o. male with a history of seizure who is undergoing an EEG to evaluate for seizures. Report: This EEG was acquired with electrodes placed according to the International 10-20 electrode system (including Fp1, Fp2, F3, F4, C3, C4, P3, P4, O1, O2, T3, T4, T5, T6, A1, A2, Fz, Cz, Pz). The following electrodes were missing or displaced: none. The occipital dominant rhythm was 3-6 Hz. This activity is reactive to stimulation. Drowsiness was manifested by background fragmentation; deeper stages of sleep were identified by K complexes and sleep spindles. There was no focal slowing. There were no interictal epileptiform discharges. There were no electrographic seizures identified. Impression and clinical correlation: This EEG was obtained while awake and asleep and is abnormal due to moderate-to-severe diffuse slowing indicative of global cerebral dysfunction. Epileptiform abnormalities were not seen during this recording. Elida Matthews, MD [facility] [phone] If 7pm- 7am, please page neurology on call as listed in AMION.   DG Chest Port 1 View Result Date: 05/09/2023 CLINICAL DATA:  Acute respiratory failure. Endotracheally intubated. EXAM: PORTABLE CHEST 1 VIEW COMPARISON:  05/06/2023 FINDINGS: Support lines and tubes in appropriate position. A new esophageal temperature probe is seen with the tip in the midthoracic esophagus. Heart size is within normal limits. Mild opacity is again seen in the medial left retrocardiac lung base, without significant change. No pneumothorax or pleural effusion visualized. IMPRESSION: Support lines and tubes in appropriate position. No significant change in mild atelectasis or infiltrate in medial left lung base. Electronically Signed   By: Norleen DELENA Kil M.D.   On:  05/09/2023 15:50   Rapid EEG Result Date:  05/09/2023 Shelton Arlin KIDD, MD     05/09/2023  6:34 PM Patient Name: Jared Mayer MRN: [id number] Epilepsy Attending: Arlin KIDD Shelton Referring Physician/Provider: Michaela Aisha SQUIBB, MD Duration: 03/08/2023 1622 to 1654, 2252 to 05/09/2023 1147 Patient history: 53yo M with ams getting ceribell EEG to evaluate for seizure Level of alertness: lethargic AEDs during EEG study: Clonazepam , propofol  Technical aspects: This EEG was obtained using a 10 lead EEG system positioned circumferentially without any parasagittal coverage (rapid EEG). Computer selected EEG is reviewed as  well as background features and all clinically significant events. Description: EEG showed continuous generalized 4 to 6 Hz theta-delta slowing with overriding 12-14hz  beta activity distributed symmetrically and diffusely.  Hyperventilation and photic stimulation were not performed.   ABNORMALITY - Continuous slow, generalized IMPRESSION: This limited ceribell EEG is suggestive of moderate to severe diffuse encephalopathy likely related to sedation. No seizures or epileptiform discharges were seen throughout the recording. Arlin KIDD Shelton   MR CERVICAL SPINE WO CONTRAST Result Date: 05/08/2023 CLINICAL DATA:  Anoxic brain injury, assess for watershed infarcts; cervical myelopathy EXAM: MRI HEAD WITHOUT CONTRAST MRI CERVICAL SPINE WITHOUT CONTRAST TECHNIQUE: Multiplanar, multiecho pulse sequences of the brain and surrounding structures, and cervical spine, to include the craniocervical junction and cervicothoracic junction, were obtained without intravenous contrast. COMPARISON:  03/10/2021 MRI head, no prior MRI cervical spine FINDINGS: MRI HEAD FINDINGS Brain: No restricted diffusion to suggest acute or subacute infarct. No acute hemorrhage, mass, mass effect, or midline shift. No hydrocephalus or extra-axial collection. Pituitary and craniocervical junction within normal limits. No  hemosiderin deposition to suggest remote hemorrhage. Vascular: Normal arterial flow voids. Skull and upper cervical spine: Normal marrow signal. Sinuses/Orbits: Mucosal thickening in the right maxillary sinus, bilateral ethmoid air cells, and right frontal sinus. No acute finding in the orbits. Other: Trace fluid in the mastoid air cells. MRI CERVICAL SPINE FINDINGS Alignment: No significant listhesis. Preservation of the normal cervical lordosis. Vertebrae: No acute fracture, evidence of discitis, or suspicious osseous lesion. Increased T1 and T2 signal, most prominent at C5-C6 and C6-C7, compatible with Modic type 2 endplate degenerative changes. Cord: Normal signal and morphology. Posterior Fossa, vertebral arteries, paraspinal tissues: Negative. Disc levels: C2-C3: No significant disc bulge. No spinal canal stenosis or neuroforaminal narrowing. C3-C4: Minimal disc bulge. Right greater than left facet and uncovertebral hypertrophy. Mild spinal canal stenosis. Moderate to severe right neural foraminal narrowing. C4-C5: Mild disc bulge. Facet and uncovertebral hypertrophy. Mild spinal canal stenosis. Mild left and moderate right neural foraminal narrowing. C5-C6: Disc height loss and mild disc bulge. Facet and uncovertebral hypertrophy. No spinal canal stenosis. Mild left and moderate to severe right neural foraminal narrowing. C6-C7: Mild disc bulge. Facet and uncovertebral hypertrophy. No spinal canal stenosis. Mild bilateral neural foraminal narrowing. C7-T1: No significant disc bulge. No spinal canal stenosis or neural foraminal narrowing. IMPRESSION: 1. No acute intracranial process. No evidence of acute infarct or anoxic brain injury. 2. C3-C4 mild spinal canal stenosis and moderate to severe right neural foraminal narrowing. 3. C4-C5 mild spinal canal stenosis with mild left and moderate right neural foraminal narrowing. 4. C5-C6 moderate to severe right and mild left neural foraminal narrowing. 5. C6-C7  mild bilateral neural foraminal narrowing. Electronically Signed   By: Donald Campion M.D.   On: 05/08/2023 22:28   MR BRAIN WO CONTRAST Result Date: 05/08/2023 CLINICAL DATA:  Anoxic brain injury, assess for watershed infarcts; cervical myelopathy EXAM: MRI HEAD WITHOUT CONTRAST MRI CERVICAL SPINE WITHOUT CONTRAST TECHNIQUE: Multiplanar, multiecho pulse  sequences of the brain and surrounding structures, and cervical spine, to include the craniocervical junction and cervicothoracic junction, were obtained without intravenous contrast. COMPARISON:  03/10/2021 MRI head, no prior MRI cervical spine FINDINGS: MRI HEAD FINDINGS Brain: No restricted diffusion to suggest acute or subacute infarct. No acute hemorrhage, mass, mass effect, or midline shift. No hydrocephalus or extra-axial collection. Pituitary and craniocervical junction within normal limits. No hemosiderin deposition to suggest remote hemorrhage. Vascular: Normal arterial flow voids. Skull and upper cervical spine: Normal marrow signal. Sinuses/Orbits: Mucosal thickening in the right maxillary sinus, bilateral ethmoid air cells, and right frontal sinus. No acute finding in the orbits. Other: Trace fluid in the mastoid air cells. MRI CERVICAL SPINE FINDINGS Alignment: No significant listhesis. Preservation of the normal cervical lordosis. Vertebrae: No acute fracture, evidence of discitis, or suspicious osseous lesion. Increased T1 and T2 signal, most prominent at C5-C6 and C6-C7, compatible with Modic type 2 endplate degenerative changes. Cord: Normal signal and morphology. Posterior Fossa, vertebral arteries, paraspinal tissues: Negative. Disc levels: C2-C3: No significant disc bulge. No spinal canal stenosis or neuroforaminal narrowing. C3-C4: Minimal disc bulge. Right greater than left facet and uncovertebral hypertrophy. Mild spinal canal stenosis. Moderate to severe right neural foraminal narrowing. C4-C5: Mild disc bulge. Facet and uncovertebral  hypertrophy. Mild spinal canal stenosis. Mild left and moderate right neural foraminal narrowing. C5-C6: Disc height loss and mild disc bulge. Facet and uncovertebral hypertrophy. No spinal canal stenosis. Mild left and moderate to severe right neural foraminal narrowing. C6-C7: Mild disc bulge. Facet and uncovertebral hypertrophy. No spinal canal stenosis. Mild bilateral neural foraminal narrowing. C7-T1: No significant disc bulge. No spinal canal stenosis or neural foraminal narrowing. IMPRESSION: 1. No acute intracranial process. No evidence of acute infarct or anoxic brain injury. 2. C3-C4 mild spinal canal stenosis and moderate to severe right neural foraminal narrowing. 3. C4-C5 mild spinal canal stenosis with mild left and moderate right neural foraminal narrowing. 4. C5-C6 moderate to severe right and mild left neural foraminal narrowing. 5. C6-C7 mild bilateral neural foraminal narrowing. Electronically Signed   By: Donald Campion M.D.   On: 05/08/2023 22:28   DG Chest Port 1 View Result Date: 05/06/2023 CLINICAL DATA:  53 year old male status post central Mayer placement. EXAM: PORTABLE CHEST 1 VIEW COMPARISON:  Chest x-ray 05/06/2023. FINDINGS: An endotracheal tube is in place with tip 7.1 cm above the carina. A nasogastric tube is seen extending into the stomach, however, the tip of the nasogastric tube extends below the lower margin of the image. There is a left-sided internal jugular central venous catheter with tip terminating in the superior cavoatrial junction. Lung volumes are low. Improved aeration at the left lung base likely reflecting resolving atelectasis and/or consolidation. No definite pleural effusions. No pneumothorax. No evidence of pulmonary edema. Heart size is normal. Upper mediastinal contours are within normal limits allowing for patient positioning. IMPRESSION: 1. Support apparatus, as above. 2. Improving aeration in the left lung base suggesting resolving  atelectasis/consolidation. Electronically Signed   By: Toribio Aye M.D.   On: 05/06/2023 05:34   DG Chest Port 1 View Result Date: 05/06/2023 CLINICAL DATA:  [id number]. Intubation of airway performed without difficulty. Check placement. EXAM: PORTABLE CHEST 1 VIEW COMPARISON:  Portable chest yesterday at 8:52 p.m. FINDINGS: 4:18 a.m. ETT interval insertion with tip 5.2 cm from the carina, T4 level. NGT extends toward the distal stomach but is cropped from the field in the midline. The cardiac size is borderline. There is increased central vascular  prominence without overt edema. There is increased haziness in the left lower lung field concerning for a small layering pleural effusion, with overlying left lower lobe consolidation or atelectasis. There is bilateral linear perihilar atelectasis with remaining lungs clear. Stable mediastinum. No new osseous findings. Thoracic spondylosis. In all other respects no further changes. IMPRESSION: 1. ETT tip 5.2 cm from the carina, T4 level. 2. NGT extends toward the distal stomach but is excluded by collimation in the midline. 3. Increased central vascular prominence without overt edema. 4. Increased haziness in the left lower lung field concerning for a small layering pleural effusion, with overlying left lower lobe consolidation or atelectasis. Electronically Signed   By: Francis Quam M.D.   On: 05/06/2023 04:50   DG Chest Port 1 View Result Date: 05/05/2023 CLINICAL DATA:  Lupe into the area. EXAM: PORTABLE CHEST 1 VIEW COMPARISON:  Chest radiograph dated 05/02/2023. FINDINGS: Enteric tube with tip in the region of the distal stomach. Left lung base reticulonodular densities may represent atelectasis or infiltrate. Aspiration is not excluded. No focal consolidation, pleural effusion, pneumothorax. Stable cardiac silhouette. No acute osseous pathology. Air distended visualized bowel. IMPRESSION: 1. Enteric tube with tip in the region of the distal stomach. 2.  Left lung base atelectasis or infiltrate. Electronically Signed   By: Vanetta Chou M.D.   On: 05/05/2023 21:07   DG Abd 1 View Result Date: 05/03/2023 CLINICAL DATA:  Evaluate for feeding tube. EXAM: ABDOMEN - 1 VIEW COMPARISON:  Abdominal radiograph dated 04/30/2023. FINDINGS: Enteric tube with tip in the right upper abdomen, likely in the distal stomach. Air is noted in the small and large bowel. The osseous structures are intact. The soft tissues are unremarkable. IMPRESSION: Enteric tube with tip in the distal stomach. Electronically Signed   By: Vanetta Chou M.D.   On: 05/03/2023 16:19   DG Chest Port 1 View Result Date: 05/02/2023 CLINICAL DATA:  Rhonchi. EXAM: PORTABLE CHEST 1 VIEW COMPARISON:  04/21/2023 FINDINGS: Patchy airspace disease is seen at the right base. Left lung clear. The cardiopericardial silhouette is within normal limits for size. The NG tube passes into the stomach although the distal tip position is not included on the film. No acute bony abnormality. Telemetry leads overlie the chest. IMPRESSION: Patchy airspace disease at the right base, compatible with atelectasis or pneumonia. Electronically Signed   By: Camellia Candle M.D.   On: 05/02/2023 11:36   DG Abd 1 View Result Date: 04/30/2023 CLINICAL DATA:  Feeding tube placement EXAM: ABDOMEN - 1 VIEW COMPARISON:  None Available. FINDINGS: Feeding tube tip is in the fundus of the stomach. Nonobstructive bowel gas pattern. Visualized lung bases clear. IMPRESSION: Feeding tube tip in the fundus of the stomach. Electronically Signed   By: Franky Crease M.D.   On: 04/30/2023 23:52   EEG adult Result Date: 04/28/2023 Shelton Arlin KIDD, MD     04/28/2023  2:15 PM Patient Name: Jared Mayer MRN: [id number] Epilepsy Attending: Arlin KIDD Shelton Referring Physician/Provider: Jerrie Lola CROME, MD Date: 04/28/2023 Duration: 1 hour Patient history: 53yo M with ams getting eeg to evaluate for seizure Level of alertness: Awake, asleep  AEDs during EEG study: Klonopin  Technical aspects: This EEG study was done with scalp electrodes positioned according to the 10-20 International system of electrode placement. Electrical activity was reviewed with band pass filter of 1-70Hz , sensitivity of 7 uV/mm, display speed of 82mm/sec with a 60Hz  notched filter applied as appropriate. EEG data were recorded continuously and digitally stored.  Video monitoring was available and reviewed as appropriate. Description: The posterior dominant rhythm consists of 9-10 Hz activity of moderate voltage (25-35 uV) seen predominantly in posterior head regions, symmetric and reactive to eye opening and eye closing. Sleep was characterized by vertex waves, sleep spindles (12 to 14 Hz), maximal frontocentral region. There is an excessive amount of 15 to 18 Hz beta activity distributed symmetrically and diffusely. Hyperventilation and photic stimulation were not performed.   ABNORMALITY - Excessive beta, generalized IMPRESSION: This study is within normal limits. The excessive beta activity seen in the background is most likely due to the effect of benzodiazepine and is a benign EEG pattern. No seizures or epileptiform discharges were seen throughout the recording. A normal interictal EEG does not exclude the diagnosis of epilepsy. Priyanka MALVA Krebs   CT HEAD WO CONTRAST ( ) Result Date: 04/28/2023 CLINICAL DATA:  Altered mental status EXAM: CT HEAD WITHOUT CONTRAST TECHNIQUE: Contiguous axial images were obtained from the base of the skull through the vertex without intravenous contrast. RADIATION DOSE REDUCTION: This exam was performed according to the departmental dose-optimization program which includes automated exposure control, adjustment of the mA and/or kV according to patient size and/or use of iterative reconstruction technique. COMPARISON:  None Available. FINDINGS: Brain: No mass,hemorrhage or extra-axial collection. Normal appearance of the parenchyma and  CSF spaces. Vascular: No hyperdense vessel or unexpected vascular calcification. Skull: The visualized skull base, calvarium and extracranial soft tissues are normal. Sinuses/Orbits: No fluid levels or advanced mucosal thickening of the visualized paranasal sinuses. No mastoid or middle ear effusion. Normal orbits. IMPRESSION: Normal head CT. Electronically Signed   By: Franky Stanford M.D.   On: 04/28/2023 02:28   DG [place] MATSU Tube Plc W/Fl W/Rad Result Date: 04/22/2023 CLINICAL DATA:  Provided history: Dysphagia. Request received for nasogastric placement under fluoroscopy. EXAM: NASO G TUBE PLACEMENT WITH FL AND WITH RAD CONTRAST:  None. FLUOROSCOPY: Fluoroscopy Time:  36 seconds Radiation Exposure Index (if provided by the fluoroscopic device): 7.50 Number of Acquired Spot Images: 1 COMPARISON:  CT chest/abdomen/pelvis 04/18/2023. FINDINGS: The right nostril was lubricated with lidocaine  gel. Subsequently under intermittent fluoroscopic guidance, a nasogastric tube was advanced via the right nostril to the level of the gastric body. There was immediate return of brown-colored gastric contents. The enteric tube was secured at the nose. Fluoroscopic images and a radiograph were acquired and sent to PACS. No immediate post-procedure complication was apparent. The procedure was performed by Warren Dais, NP, supervised by Dr. Rockey Childs. IMPRESSION: Successful fluoroscopically-guided nasogastric tube placement (with tube tip at the level of the gastric body). Electronically Signed   By: Rockey Childs D.O.   On: 04/22/2023 15:53   EEG adult Result Date: 04/21/2023 Krebs Arlin MALVA, MD     04/21/2023  5:44 PM Patient Name: Jared Mayer MRN: [id number] Epilepsy Attending: Arlin MALVA Krebs Referring Physician/Provider: Darci Pore, MD Date: 04/21/2023 Duration: 26.13 mins Patient history: 53 yo M with ams getting eeg to evaluate for seizure Level of alertness: Awake AEDs during EEG study: LTG, TPM  Technical aspects: This EEG study was done with scalp electrodes positioned according to the 10-20 International system of electrode placement. Electrical activity was reviewed with band pass filter of 1-70Hz , sensitivity of 7 uV/mm, display speed of 107mm/sec with a 60Hz  notched filter applied as appropriate. EEG data were recorded continuously and digitally stored.  Video monitoring was available and reviewed as appropriate. Description: The posterior dominant rhythm consists of 8 Hz activity of moderate voltage (  25-35 uV) seen predominantly in posterior head regions, symmetric and reactive to eye opening and eye closing. Hyperventilation and photic stimulation were not performed.   IMPRESSION: This study is within normal limits. No seizures or epileptiform discharges were seen throughout the recording. A normal interictal EEG does not exclude the diagnosis of epilepsy. Arlin MALVA Krebs   DG Chest Port 1 View Result Date: 04/21/2023 CLINICAL DATA:  Tachycardia tachypnea EXAM: PORTABLE CHEST - 1 VIEW COMPARISON:  04/18/2023 FINDINGS: Cardiomediastinal silhouette and pulmonary vasculature are within normal limits. Lungs are clear. IMPRESSION: No acute cardiopulmonary process. Electronically Signed   By: Aliene Lloyd M.D.   On: 04/21/2023 11:27   CT HEAD WO CONTRAST ( ) Result Date: 04/20/2023 CLINICAL DATA:  Altered mental status. EXAM: CT HEAD WITHOUT CONTRAST TECHNIQUE: Contiguous axial images were obtained from the base of the skull through the vertex without intravenous contrast. RADIATION DOSE REDUCTION: This exam was performed according to the departmental dose-optimization program which includes automated exposure control, adjustment of the mA and/or kV according to patient size and/or use of iterative reconstruction technique. COMPARISON:  Head CT 04/03/2023. FINDINGS: Brain: No acute intracranial hemorrhage. Gray-white differentiation is preserved. No hydrocephalus or extra-axial collection. No  mass effect or midline shift. Vascular: No hyperdense vessel or unexpected calcification. Skull: No calvarial fracture or suspicious bone lesion. Skull base is unremarkable. Sinuses/Orbits: No acute finding. Other: None. IMPRESSION: No acute intracranial abnormality. Electronically Signed   By: Ryan Chess M.D.   On: 04/20/2023 11:44   ECHOCARDIOGRAM COMPLETE Result Date: 04/19/2023    ECHOCARDIOGRAM REPORT   Patient Name:   Jared Mayer Date of Exam: 04/19/2023 Medical Rec #:  [id number]    Height:       68.0 in Accession #:    [id number]   Weight:       170.6 lb Date of Birth:  December 27, 1970    BSA:          1.910 m Patient Age:    52 years     BP:           124/75 mmHg Patient Gender: M            HR:           108 bpm. Exam Location:  [facility] Procedure: 2D Echo, Cardiac Doppler and Color Doppler Indications:     Bacteremia, Fever  History:         Patient has no prior history of Echocardiogram examinations.                  Signs/Symptoms:Bacteremia, Altered Mental Status and Fever.  Sonographer:     Naomie Reef Referring Phys:  [id number] AIDA CHO Diagnosing Phys: Cara JONETTA Lovelace MD  Sonographer Comments: Technically challenging study due to limited acoustic windows, suboptimal parasternal window, suboptimal apical window and no subcostal window. IMPRESSIONS  1. Left ventricular ejection fraction, by estimation, is 70 to 75%. The left ventricle has hyperdynamic function. The left ventricle has no regional wall motion abnormalities. Left ventricular diastolic parameters are consistent with Grade I diastolic dysfunction (impaired relaxation).  2. Right ventricular systolic function is normal. The right ventricular size is normal.  3. The mitral valve is normal in structure. Mild mitral valve regurgitation.  4. The aortic valve is normal in structure. Aortic valve regurgitation is not visualized. Aortic valve sclerosis is present, with no evidence of aortic valve stenosis. FINDINGS  Left Ventricle: Left  ventricular ejection fraction, by estimation, is 70 to 75%. The left ventricle  has hyperdynamic function. The left ventricle has no regional wall motion abnormalities. The left ventricular internal cavity size was normal in size. There is no left ventricular hypertrophy. Left ventricular diastolic parameters are consistent with Grade I diastolic dysfunction (impaired relaxation). Right Ventricle: The right ventricular size is normal. No increase in right ventricular wall thickness. Right ventricular systolic function is normal. Left Atrium: Left atrial size was normal in size. Right Atrium: Right atrial size was normal in size. Pericardium: There is no evidence of pericardial effusion. Mitral Valve: The mitral valve is normal in structure. Mild mitral valve regurgitation. MV peak gradient, 4.4 mmHg. The mean mitral valve gradient is 2.0 mmHg. Tricuspid Valve: The tricuspid valve is normal in structure. Tricuspid valve regurgitation is mild. Aortic Valve: The aortic valve is normal in structure. Aortic valve regurgitation is not visualized. Aortic valve sclerosis is present, with no evidence of aortic valve stenosis. Aortic valve mean gradient measures 3.0 mmHg. Aortic valve peak gradient measures 6.5 mmHg. Aortic valve area, by VTI measures 2.36 cm. Pulmonic Valve: The pulmonic valve was normal in structure. Pulmonic valve regurgitation is not visualized. Aorta: The ascending aorta was not well visualized. IAS/Shunts: No atrial level shunt detected by color flow Doppler.  LEFT VENTRICLE PLAX 2D LVIDd:         2.60 cm LVIDs:         1.60 cm LV PW:         1.10 cm LV IVS:        1.20 cm LVOT diam:     1.90 cm LV SV:         60 LV SV Index:   32 LVOT Area:     2.84 cm  RIGHT VENTRICLE RV Basal diam:  3.70 cm RV Mid diam:    3.10 cm RV S prime:     23.10 cm/s TAPSE (M-mode): 2.8 cm LEFT ATRIUM         Index       RIGHT ATRIUM           Index LA diam:    2.90 cm 1.52 cm/m  RA Area:     11.90 cm                                  RA Volume:   28.20 ml  14.76 ml/m  AORTIC VALVE AV Area (Vmax):    2.55 cm AV Area (Vmean):   2.81 cm AV Area (VTI):     2.36 cm AV Vmax:           127.00 cm/s AV Vmean:          80.200 cm/s AV VTI:            0.256 m AV Peak Grad:      6.5 mmHg AV Mean Grad:      3.0 mmHg LVOT Vmax:         114.00 cm/s LVOT Vmean:        79.600 cm/s LVOT VTI:          0.213 m LVOT/AV VTI ratio: 0.83  AORTA Ao Root diam: 3.70 cm MITRAL VALVE MV Area (PHT): 6.65 cm    SHUNTS MV Area VTI:   2.89 cm    Systemic VTI:  0.21 m MV Peak grad:  4.4 mmHg    Systemic Diam: 1.90 cm MV Mean grad:  2.0 mmHg MV Vmax:  1.05 m/s MV Vmean:      61.4 cm/s MV Decel Time: 114 msec MV E velocity: 79.70 cm/s MV A velocity: 91.70 cm/s MV E/A ratio:  0.87 Dwayne D Callwood MD Electronically signed by Cara JONETTA Lovelace MD Signature Date/Time: 04/19/2023/4:35:27 PM    Final    CT CHEST ABDOMEN PELVIS W CONTRAST Result Date: 04/18/2023 CLINICAL DATA:  Sepsis EXAM: CT CHEST, ABDOMEN, AND PELVIS WITH CONTRAST TECHNIQUE: Multidetector CT imaging of the chest, abdomen and pelvis was performed following the standard protocol during bolus administration of intravenous contrast. RADIATION DOSE REDUCTION: This exam was performed according to the departmental dose-optimization program which includes automated exposure control, adjustment of the mA and/or kV according to patient size and/or use of iterative reconstruction technique. CONTRAST:  OMNIPAQUE  IOHEXOL  300 MG/ML  SOLN COMPARISON:  04/18/2023 FINDINGS: CT CHEST FINDINGS Cardiovascular: Heart is unremarkable without pericardial effusion. No evidence of thoracic aortic aneurysm or dissection. Atherosclerosis of the aortic arch and descending thoracic aorta. Mediastinum/Nodes: No enlarged mediastinal, hilar, or axillary lymph nodes. Thyroid  gland, trachea, and esophagus demonstrate no significant findings. Lungs/Pleura: No acute airspace disease, effusion, or pneumothorax. Central  airways are patent. Musculoskeletal: No acute or destructive bony abnormalities. Reconstructed images demonstrate no additional findings. CT ABDOMEN PELVIS FINDINGS Hepatobiliary: No focal liver abnormality is seen. No gallstones, gallbladder wall thickening, or biliary dilatation. Pancreas: Unremarkable. No pancreatic ductal dilatation or surrounding inflammatory changes. Spleen: Normal in size without focal abnormality. Adrenals/Urinary Tract: Adrenal glands are unremarkable. Kidneys are normal, without renal calculi, focal lesion, or hydronephrosis. Punctate gas lucencies within the bladder lumen may reflect recent catheterization. Bladder is minimally distended, limiting its evaluation. Stomach/Bowel: No bowel obstruction or ileus. Normal appendix right lower quadrant. No bowel wall thickening or inflammatory change. Vascular/Lymphatic: Aortic atherosclerosis. No enlarged abdominal or pelvic lymph nodes. Reproductive: Prostate is unremarkable. Other: No free fluid or free intraperitoneal gas. No abdominal wall hernia. Musculoskeletal: No acute or destructive bony abnormalities. Reconstructed images demonstrate no additional findings. IMPRESSION: 1. No acute intrathoracic, intra-abdominal, or intrapelvic process. 2. Punctate gas lucencies within the bladder lumen, likely from recent instrumentation. 3.  Aortic Atherosclerosis (ICD10-I70.0). Electronically Signed   By: Ozell Daring M.D.   On: 04/18/2023 16:54   DG Chest Port 1 View Result Date: 04/18/2023 CLINICAL DATA:  53 year old male with fever. EXAM: PORTABLE CHEST 1 VIEW COMPARISON:  Portable chest 04/08/2023 and earlier. FINDINGS: Portable AP upright view at 1036 hours. Similar mild patient rotation to the right. Chronic mild asymmetrically decreased right lung volume. Mediastinal contours remain within normal limits. Visualized tracheal air column is within normal limits. Allowing for portable technique the lungs are clear. No acute osseous  abnormality identified. Negative visible bowel gas. IMPRESSION: No acute cardiopulmonary abnormality. Electronically Signed   By: VEAR Hurst M.D.   On: 04/18/2023 10:41    Microbiology: Recent Results (from the past 240 hours)  MRSA Next Gen by PCR, Nasal     Status: None   Collection Time: 05/06/23 12:31 PM   Specimen: Nasal Mucosa; Nasal Swab  Result Value Ref Range Status   MRSA by PCR Next Gen NOT DETECTED NOT DETECTED Final    Comment: (NOTE) The GeneXpert MRSA Assay (FDA approved for NASAL specimens only), is one component of a comprehensive MRSA colonization surveillance program. It is not intended to diagnose MRSA infection nor to guide or monitor treatment for MRSA infections. Test performance is not FDA approved in patients less than 14 years old. Performed at [facility], [phone]  [address] [place], [place] [address]   Culture, BAL-quantitative w Gram Stain     Status: Abnormal   Collection Time: 05/06/23  2:05 PM   Specimen: Bronchoalveolar Lavage; Respiratory  Result Value Ref Range Status   Specimen Description   Final    BRONCHIAL ALVEOLAR LAVAGE Performed at [facility], [address], [place], [place] [address]    Special Requests   Final    NONE Performed at [facility], [address] [place], [place] [address]    Gram Stain   Final    RARE WBC PRESENT, PREDOMINANTLY PMN NO ORGANISMS SEEN Performed at [facility], [address] [place], [place] [address]    Culture 60,000 COLONIES/mL STAPHYLOCOCCUS AUREUS (A)  Final   Report Status 05/09/2023 FINAL  Final   Organism ID, Bacteria STAPHYLOCOCCUS AUREUS (A)  Final      Susceptibility   Staphylococcus aureus - MIC*    CIPROFLOXACIN <=0.5 SENSITIVE Sensitive     ERYTHROMYCIN <=0.25 SENSITIVE Sensitive     GENTAMICIN <=0.5 SENSITIVE Sensitive     OXACILLIN 0.5 SENSITIVE Sensitive     TETRACYCLINE <=1 SENSITIVE Sensitive     VANCOMYCIN  <=0.5 SENSITIVE Sensitive      TRIMETH/SULFA <=10 SENSITIVE Sensitive     CLINDAMYCIN <=0.25 SENSITIVE Sensitive     RIFAMPIN <=0.5 SENSITIVE Sensitive     Inducible Clindamycin NEGATIVE Sensitive     LINEZOLID 2 SENSITIVE Sensitive     * 60,000 COLONIES/mL STAPHYLOCOCCUS AUREUS  Culture, blood (Routine X 2) w Reflex to ID Panel     Status: None   Collection Time: 05/09/23  1:56 PM   Specimen: BLOOD  Result Value Ref Range Status   Specimen Description BLOOD BLOOD RIGHT HAND  Final   Special Requests   Final    BOTTLES DRAWN AEROBIC AND ANAEROBIC Blood Culture adequate volume   Culture   Final    NO GROWTH 5 DAYS Performed at [facility], [address] [place], [place] [address]    Report Status June 01, 2023 FINAL  Final  Culture, blood (Routine X 2) w Reflex to ID Panel     Status: None   Collection Time: 05/09/23  4:20 PM   Specimen: BLOOD  Result Value Ref Range Status   Specimen Description BLOOD BLOOD RIGHT HAND  Final   Special Requests   Final    BOTTLES DRAWN AEROBIC AND ANAEROBIC Blood Culture results may not be optimal due to an inadequate volume of blood received in culture bottles   Culture   Final    NO GROWTH 5 DAYS Performed at [facility], [address], [place], [place] [address]    Report Status 2023/06/01 FINAL  Final    Time spent: 10 minutes  Signed: Delon Herald, MD 2023-06-01

## 2023-06-12 NOTE — Plan of Care (Signed)
 Went in to check on pt and he was agonal breathing.  Remained with patient until his last breath.  Verified TOD w/Robert Jerilynn, RN.  Called his mother and was only able to leave a msg.  Notified AC and on call NP.  Notified [employer] and pt is a potential eye donor.

## 2023-06-12 NOTE — Plan of Care (Signed)
 PMT following for assistance with end-of-life symptom management.  Patient is now deceased.  In to bedside to answer questions or offer support to family members who may be present.  Family members have already left at this time.  PMT will sign off.

## 2023-06-12 DEATH — deceased

## 2023-10-26 ENCOUNTER — Ambulatory Visit: Payer: Medicare HMO | Admitting: Physician Assistant
